# Patient Record
Sex: Female | Born: 1985 | Race: White | Hispanic: No | Marital: Single | Smoking: Current every day smoker
Health system: Southern US, Community
[De-identification: ages and names within clinical notes are randomized; demographics above are authoritative.]

## PROBLEM LIST (undated history)

## (undated) DIAGNOSIS — F319 Bipolar disorder, unspecified: Secondary | ICD-10-CM

## (undated) DIAGNOSIS — F1910 Other psychoactive substance abuse, uncomplicated: Secondary | ICD-10-CM

## (undated) DIAGNOSIS — F419 Anxiety disorder, unspecified: Secondary | ICD-10-CM

## (undated) DIAGNOSIS — G4700 Insomnia, unspecified: Secondary | ICD-10-CM

## (undated) DIAGNOSIS — F329 Major depressive disorder, single episode, unspecified: Secondary | ICD-10-CM

## (undated) DIAGNOSIS — F32A Depression, unspecified: Secondary | ICD-10-CM

---

## 2004-02-11 ENCOUNTER — Emergency Department: Payer: Self-pay | Admitting: General Practice

## 2004-07-29 ENCOUNTER — Inpatient Hospital Stay: Payer: Self-pay | Admitting: Psychiatry

## 2004-10-14 ENCOUNTER — Emergency Department: Payer: Self-pay | Admitting: Emergency Medicine

## 2005-03-28 ENCOUNTER — Emergency Department: Payer: Self-pay | Admitting: Emergency Medicine

## 2005-09-14 ENCOUNTER — Emergency Department: Payer: Self-pay | Admitting: Internal Medicine

## 2006-09-04 ENCOUNTER — Emergency Department: Payer: Self-pay | Admitting: Emergency Medicine

## 2006-11-22 ENCOUNTER — Emergency Department: Payer: Self-pay

## 2007-08-13 ENCOUNTER — Emergency Department: Payer: Self-pay | Admitting: Emergency Medicine

## 2008-02-29 ENCOUNTER — Emergency Department: Payer: Self-pay | Admitting: Emergency Medicine

## 2008-08-25 ENCOUNTER — Emergency Department: Payer: Self-pay | Admitting: Emergency Medicine

## 2009-06-03 ENCOUNTER — Emergency Department: Payer: Self-pay | Admitting: Emergency Medicine

## 2010-10-26 ENCOUNTER — Emergency Department: Payer: Self-pay | Admitting: Emergency Medicine

## 2010-11-06 ENCOUNTER — Emergency Department: Payer: Self-pay | Admitting: Emergency Medicine

## 2011-05-31 ENCOUNTER — Emergency Department (HOSPITAL_COMMUNITY)
Admission: EM | Admit: 2011-05-31 | Discharge: 2011-05-31 | Disposition: A | Discharge status: Home / Self Care | Payer: Self-pay | Attending: Emergency Medicine | Admitting: Emergency Medicine

## 2011-05-31 ENCOUNTER — Encounter (HOSPITAL_COMMUNITY): Payer: Self-pay | Admitting: *Deleted

## 2011-05-31 DIAGNOSIS — S335XXA Sprain of ligaments of lumbar spine, initial encounter: Secondary | ICD-10-CM | POA: Insufficient documentation

## 2011-05-31 DIAGNOSIS — X58XXXA Exposure to other specified factors, initial encounter: Secondary | ICD-10-CM | POA: Insufficient documentation

## 2011-05-31 DIAGNOSIS — S39012A Strain of muscle, fascia and tendon of lower back, initial encounter: Secondary | ICD-10-CM

## 2011-05-31 MED ORDER — DEXAMETHASONE 6 MG PO TABS: ORAL_TABLET | ORAL | Status: AC

## 2011-05-31 MED ORDER — HYDROCODONE-ACETAMINOPHEN 5-325 MG PO TABS
2.0000 | ORAL_TABLET | Freq: Once | ORAL | Status: AC
  Administered 2011-05-31: 2 via ORAL
  Filled 2011-05-31: qty 2

## 2011-05-31 MED ORDER — HYDROCODONE-ACETAMINOPHEN 7.5-325 MG PO TABS: 1.0000 | ORAL_TABLET | ORAL | Status: AC | PRN

## 2011-05-31 MED ORDER — ONDANSETRON HCL 4 MG PO TABS
4.0000 mg | ORAL_TABLET | Freq: Once | ORAL | Status: AC
  Administered 2011-05-31: 4 mg via ORAL
  Filled 2011-05-31: qty 1

## 2011-05-31 MED ORDER — DIAZEPAM 5 MG PO TABS
5.0000 mg | ORAL_TABLET | Freq: Once | ORAL | Status: AC
  Administered 2011-05-31: 5 mg via ORAL
  Filled 2011-05-31: qty 1

## 2011-05-31 MED ORDER — DEXAMETHASONE SODIUM PHOSPHATE 4 MG/ML IJ SOLN
8.0000 mg | Freq: Once | INTRAMUSCULAR | Status: AC
  Administered 2011-05-31: 8 mg via INTRAMUSCULAR
  Filled 2011-05-31 (×2): qty 1

## 2011-05-31 MED ORDER — METHOCARBAMOL 500 MG PO TABS: ORAL_TABLET | ORAL | Status: DC
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
## 2011-06-13 ENCOUNTER — Other Ambulatory Visit: Payer: Self-pay

## 2011-06-13 ENCOUNTER — Encounter (HOSPITAL_COMMUNITY): Payer: Self-pay | Admitting: *Deleted

## 2011-06-13 ENCOUNTER — Emergency Department (HOSPITAL_COMMUNITY): Payer: Self-pay

## 2011-06-13 ENCOUNTER — Emergency Department (HOSPITAL_COMMUNITY)
Admission: EM | Admit: 2011-06-13 | Discharge: 2011-06-13 | Disposition: A | Discharge status: Home / Self Care | Payer: Self-pay | Attending: Emergency Medicine | Admitting: Emergency Medicine

## 2011-06-13 DIAGNOSIS — R0789 Other chest pain: Secondary | ICD-10-CM | POA: Insufficient documentation

## 2011-06-13 DIAGNOSIS — R002 Palpitations: Secondary | ICD-10-CM | POA: Insufficient documentation

## 2011-06-13 DIAGNOSIS — M542 Cervicalgia: Secondary | ICD-10-CM | POA: Insufficient documentation

## 2011-06-13 DIAGNOSIS — F411 Generalized anxiety disorder: Secondary | ICD-10-CM | POA: Insufficient documentation

## 2011-06-13 DIAGNOSIS — R51 Headache: Secondary | ICD-10-CM | POA: Insufficient documentation

## 2011-06-13 DIAGNOSIS — F17200 Nicotine dependence, unspecified, uncomplicated: Secondary | ICD-10-CM | POA: Insufficient documentation

## 2011-06-13 DIAGNOSIS — Z79899 Other long term (current) drug therapy: Secondary | ICD-10-CM | POA: Insufficient documentation

## 2011-06-13 LAB — COMPREHENSIVE METABOLIC PANEL
ALT: 34 U/L (ref 0–35)
AST: 29 U/L (ref 0–37)
Albumin: 4.2 g/dL (ref 3.5–5.2)
CO2: 24 mEq/L (ref 19–32)
Calcium: 10.4 mg/dL (ref 8.4–10.5)
Chloride: 102 mEq/L (ref 96–112)
Creatinine, Ser: 0.6 mg/dL (ref 0.50–1.10)
GFR calc non Af Amer: >90 mL/min (ref >90)
Sodium: 139 mEq/L (ref 135–145)
Total Bilirubin: 0.5 mg/dL (ref 0.3–1.2)

## 2011-06-13 LAB — CBC
HCT: 43.2 % (ref 36.0–46.0)
Hemoglobin: 15.5 g/dL — ABNORMAL HIGH (ref 12.0–15.0)
MCH: 32.8 pg (ref 26.0–34.0)
MCHC: 35.9 g/dL (ref 30.0–36.0)
MCV: 91.3 fL (ref 78.0–100.0)
Platelets: 215 10*3/uL (ref 150–400)
RBC: 4.73 MIL/uL (ref 3.87–5.11)
RDW: 12.3 % (ref 11.5–15.5)
WBC: 12.1 10*3/uL — ABNORMAL HIGH (ref 4.0–10.5)

## 2011-06-13 LAB — LIPASE, BLOOD: Lipase: 95 U/L — ABNORMAL HIGH (ref 11–59)

## 2011-06-13 LAB — POCT I-STAT TROPONIN I: Troponin i, poc: 0 ng/mL (ref 0.00–0.08)

## 2011-06-13 MED ORDER — OMEPRAZOLE 20 MG PO CPDR: 20.0000 mg | DELAYED_RELEASE_CAPSULE | Freq: Every day | ORAL | Status: DC

## 2011-06-13 MED ORDER — GI COCKTAIL ~~LOC~~
30.0000 mL | Freq: Once | ORAL | Status: AC
  Administered 2011-06-13: 30 mL via ORAL
  Filled 2011-06-13: qty 30

## 2011-06-13 MED ORDER — HYDROCODONE-ACETAMINOPHEN 5-325 MG PO TABS: 1.0000 | ORAL_TABLET | Freq: Four times a day (QID) | ORAL | Status: AC | PRN

## 2011-06-13 MED ORDER — OXYCODONE-ACETAMINOPHEN 5-325 MG PO TABS
2.0000 | ORAL_TABLET | Freq: Once | ORAL | Status: AC
  Administered 2011-06-13: 2 via ORAL
  Filled 2011-06-13: qty 2

## 2011-06-13 NOTE — Discharge Instructions (Signed)
Chest Pain (Nonspecific) It is often hard to give a specific diagnosis for the cause of chest pain. There is always a chance that your pain could be related to something serious, such as a heart attack or a blood clot in the lungs. You need to follow up with your caregiver for further evaluation. CAUSES   Heartburn.   Pneumonia or bronchitis.   Anxiety and stress.   Inflammation around your heart (pericarditis) or lung (pleuritis or pleurisy).   A blood clot in the lung.   A collapsed lung (pneumothorax). It can develop suddenly on its own (spontaneous pneumothorax) or from injury (trauma) to the chest.  The chest wall is composed of bones, muscles, and cartilage. Any of these can be the source of the pain.  The bones can be bruised by injury.   The muscles or cartilage can be strained by coughing or overwork.   The cartilage can be affected by inflammation and become sore (costochondritis).  DIAGNOSIS  Lab tests or other studies, such as X-rays, an EKG, stress testing, or cardiac imaging, may be needed to find the cause of your pain.  TREATMENT   Treatment depends on what may be causing your chest pain. Treatment may include:   Acid blockers for heartburn.   Anti-inflammatory medicine.   Pain medicine for inflammatory conditions.   Antibiotics if an infection is present.   You may be advised to change lifestyle habits. This includes stopping smoking and avoiding caffeine and chocolate.   You may be advised to keep your head raised (elevated) when sleeping. This reduces the chance of acid going backward from your stomach into your esophagus.   Most of the time, nonspecific chest pain will improve within 2 to 3 days with rest and mild pain medicine.  HOME CARE INSTRUCTIONS   If antibiotics were prescribed, take the full amount even if you start to feel better.   For the next few days, avoid physical activities that bring on chest pain. Continue physical activities as  directed.   Do not smoke cigarettes or drink alcohol until your symptoms are gone.   Only take over-the-counter or prescription medicine for pain, discomfort, or fever as directed by your caregiver.   Follow your caregiver's suggestions for further testing if your chest pain does not go away.   Keep any follow-up appointments you made. If you do not go to an appointment, you could develop lasting (chronic) problems with pain. If there is any problem keeping an appointment, you must call to reschedule.  SEEK MEDICAL CARE IF:   You think you are having problems from the medicine you are taking. Read your medicine instructions carefully.   Your chest pain does not go away, even after treatment.   You develop a rash with blisters on your chest.  SEEK IMMEDIATE MEDICAL CARE IF:   You have increased chest pain or pain that spreads to your arm, neck, jaw, back, or belly (abdomen).   You develop shortness of breath, an increasing cough, or you are coughing up blood.   You have severe back or abdominal pain, feel sick to your stomach (nauseous) or throw up (vomit).   You develop severe weakness, fainting, or chills.   You have an oral temperature above 102 F (38.9 C), not controlled by medicine.  THIS IS AN EMERGENCY. Do not wait to see if the pain will go away. Get medical help at once. Call your local emergency services (911 in [place] Do not drive yourself to   the hospital. MAKE SURE YOU:   Understand these instructions.   Will watch your condition.   Will get help right away if you are not doing well or get worse.  Document Released: 01/25/2005 Document Revised: 12/28/2010 Document Reviewed: 11/21/2007 [employer] Patient Information 2012 [employer]. 

## 2011-06-13 NOTE — ED Notes (Signed)
C/o intermittent chest pain, describes as tightness and sharp, onset last night; states "under a lot of stress" and was at rest when the pain first started; c/o "heart racing"; states pain radiates to left shoulder.

## 2011-06-13 NOTE — ED Provider Notes (Signed)
History     CSN: [id number]  Arrival date & time 06/13/11  0044   First MD Initiated Contact with Patient 06/13/11 0054      Chief Complaint  Patient presents with  . Chest Pain     Patient is a 26 y.o. female presenting with chest pain. The history is provided by the patient.  Chest Pain    the patient reports several days of constant chest tightness and heaviness.  She reports she has been under a lot of stress lately as there was a recent death in her family.  She reports she occasionally has palpitations as well.  This is described as her heart racing.  She currently reports ongoing discomfort in her chest.  She denies shortness of breath.  Nothing worsens her symptoms.  Nothing improves her symptoms.  She does have an early family history of heart disease with her father dying in his late 17s of an MI.  She's never had symptoms like this before.  She tried Pepto-Bismol at home without improvement.  She's tried ibuprofen without improvement.  She's had no recent long travel or unilateral leg swelling.  She's had no recent surgery.  She has no prior history of DVT or pulmonary embolism.  She still does smoke cigarettes.  Her discomfort is nonpleuritic  History reviewed. No pertinent past medical history.  Past Surgical History  Procedure Date  . Cesarean section   . Cesarean section     No family history on file.  History  Substance Use Topics  . Smoking status: Current Some Day Smoker -- 0.5 packs/day    Types: Cigarettes  . Smokeless tobacco: Not on file  . Alcohol Use: No    OB History    Grav Para Term Preterm Abortions TAB SAB Ect Mult Living                  Review of Systems  Cardiovascular: Positive for chest pain.  All other systems reviewed and are negative.    Allergies  Aspirin and Morphine and related  Home Medications   Current Outpatient Rx  Name Route Sig Dispense Refill  . ACETAMINOPHEN 500 MG PO TABS Oral Take 1,000 mg by mouth as needed.  For pain    . HYDROCODONE-ACETAMINOPHEN 5-325 MG PO TABS Oral Take 1 tablet by mouth every 6 (six) hours as needed for pain. 8 tablet 0  . METHOCARBAMOL 500 MG PO TABS  2 po tid for spasm 30 tablet 0  . OMEPRAZOLE 20 MG PO CPDR Oral Take 1 capsule (20 mg total) by mouth daily. 30 capsule 0    BP 132/93  Pulse 110  Temp(Src) 98.1 F (36.7 C) (Oral)  Resp 20  SpO2 100%  LMP 06/06/2011  Physical Exam  Nursing note and vitals reviewed. Constitutional: She is oriented to person, place, and time. She appears well-developed and well-nourished. No distress.  HENT:  Head: Normocephalic and atraumatic.  Eyes: EOM are normal.  Neck: Normal range of motion.  Cardiovascular: Normal rate, regular rhythm and normal heart sounds.   Pulmonary/Chest: Effort normal and breath sounds normal.  Abdominal: Soft. She exhibits no distension. There is no tenderness.  Musculoskeletal: Normal range of motion.  Neurological: She is alert and oriented to person, place, and time.  Skin: Skin is warm and dry.  Psychiatric: Judgment normal.       Anxious appearing    ED Course  Procedures (including critical care time)   Date: 06/13/2011  Rate: 89  Rhythm: normal sinus rhythm  QRS Axis: normal  Intervals: normal  ST/T Wave abnormalities: normal  Conduction Disutrbances: none  Narrative Interpretation:   Old EKG Reviewed: No prior ecg     Labs Reviewed  CBC - Abnormal; Notable for the following:    WBC 12.1 (*)    Hemoglobin 15.5 (*)    All other components within normal limits  LIPASE, BLOOD - Abnormal; Notable for the following:    Lipase 95 (*)    All other components within normal limits  COMPREHENSIVE METABOLIC PANEL  POCT I-STAT TROPONIN I   Dg Chest 2 View  06/13/2011  *RADIOLOGY REPORT*  Clinical Data: Medial chest pain.  Pain left side of face and neck.  CHEST - 2 VIEW  Comparison: None.  Findings: Normal heart size and pulmonary vascularity. Hyperinflation suggesting emphysema.  No  focal airspace consolidation in the lungs.  No blunting of costophrenic angles. No pneumothorax.  IMPRESSION: Mild emphysematous changes.  No evidence of active pulmonary disease.  Original Report Authenticated By: Marlon Pel, M.D.   i Personally reviewed the x-ray  1. Chest pain       MDM  The patient is well-appearing.  Her EKG and troponin are normal.  Her pain sounds more related to gastritis and GERD.  She has been undergoing a significant amount of stress.  Doubt PE.  DC home in good condition.3:10 AM She feels much better after GI cocktail and pain medicine here.  She has had resolution of all her symptoms.        Lyanne Co, MD 06/13/11 [phone]

## 2011-07-04 ENCOUNTER — Other Ambulatory Visit: Payer: Self-pay

## 2011-07-04 ENCOUNTER — Emergency Department (HOSPITAL_COMMUNITY)
Admission: EM | Admit: 2011-07-04 | Discharge: 2011-07-04 | Disposition: A | Discharge status: Home / Self Care | Payer: Self-pay | Attending: Emergency Medicine | Admitting: Emergency Medicine

## 2011-07-04 ENCOUNTER — Encounter (HOSPITAL_COMMUNITY): Payer: Self-pay | Admitting: *Deleted

## 2011-07-04 DIAGNOSIS — F411 Generalized anxiety disorder: Secondary | ICD-10-CM | POA: Insufficient documentation

## 2011-07-04 DIAGNOSIS — F17200 Nicotine dependence, unspecified, uncomplicated: Secondary | ICD-10-CM | POA: Insufficient documentation

## 2011-07-04 DIAGNOSIS — I498 Other specified cardiac arrhythmias: Secondary | ICD-10-CM | POA: Insufficient documentation

## 2011-07-04 DIAGNOSIS — K219 Gastro-esophageal reflux disease without esophagitis: Secondary | ICD-10-CM | POA: Insufficient documentation

## 2011-07-04 DIAGNOSIS — R0789 Other chest pain: Secondary | ICD-10-CM

## 2011-07-04 DIAGNOSIS — R071 Chest pain on breathing: Secondary | ICD-10-CM | POA: Insufficient documentation

## 2011-07-04 LAB — COMPREHENSIVE METABOLIC PANEL
ALT: 10 U/L (ref 0–35)
AST: 12 U/L (ref 0–37)
Albumin: 3.5 g/dL (ref 3.5–5.2)
Alkaline Phosphatase: 59 U/L (ref 39–117)
BUN: 11 mg/dL (ref 6–23)
Chloride: 102 mEq/L (ref 96–112)
Potassium: 4 mEq/L (ref 3.5–5.1)
Sodium: 137 mEq/L (ref 135–145)
Total Bilirubin: 0.3 mg/dL (ref 0.3–1.2)
Total Protein: 6.2 g/dL (ref 6.0–8.3)

## 2011-07-04 MED ORDER — GI COCKTAIL ~~LOC~~
30.0000 mL | Freq: Once | ORAL | Status: AC
  Administered 2011-07-04: 30 mL via ORAL
  Filled 2011-07-04: qty 30

## 2011-07-04 MED ORDER — CYCLOBENZAPRINE HCL 10 MG PO TABS: 10.0000 mg | ORAL_TABLET | Freq: Three times a day (TID) | ORAL | Status: AC | PRN

## 2011-07-04 NOTE — ED Provider Notes (Signed)
History     CSN: [id number]  Arrival date & time 07/04/11  0105   First MD Initiated Contact with Patient 07/04/11 0131      Chief Complaint  Patient presents with  . Chest Pain    (Consider location/radiation/quality/duration/timing/severity/associated sxs/prior treatment) HPI  Patient relates she's had chest pain for over a month. She states it is intermittent and can last for several minutes up to an hour. She states the pain is a pressure feeling and it also feels tight. She states she feels sometimes like she's having trouble breathing and trouble swallowing like she is smothering. She however daily and drink without difficulty.She states deep breathing can make it feel worse and also she is anxious or upset. She states nothing makes it feel better.she's had a cough without wheezing or fever. She denies nausea or vomiting. She states her chest feels sore now. She relates she was seen earlier today by Dr. Vear Clock who diagnosed her with having anxiety and anxiety attacks and started her on Paxil  PCP Dr. Vear Clock in [place]  History reviewed. No pertinent past medical history.  Past Surgical History  Procedure Date  . Cesarean section   . Cesarean section     No family history on file.   History  Substance Use Topics  . Smoking status: Current Some Day Smoker -- 0.5 packs/day    Types: Cigarettes  . Smokeless tobacco: Not on file  . Alcohol Use: No  lives with spouse Unemployed Quit smoking last week.   OB History    Grav Para Term Preterm Abortions TAB SAB Ect Mult Living                  Review of Systems  All other systems reviewed and are negative.    Allergies  Aspirin and Morphine and related  Home Medications   Current Outpatient Rx  Name Route Sig Dispense Refill  . ACETAMINOPHEN 500 MG PO TABS Oral Take 1,000 mg by mouth as needed. For pain    . METHOCARBAMOL 500 MG PO TABS  2 po tid for spasm 30 tablet 0  . OMEPRAZOLE 20 MG PO CPDR Oral  Take 1 capsule (20 mg total) by mouth daily. 30 capsule 0  Patient denies taking any birth control pills or using hormones  BP 111/66  Pulse 67  Resp 18  Ht 5\' 2"  (1.575 m)  Wt 125 lb (56.7 kg)  BMI 22.86 kg/m2  SpO2 96%  LMP 06/06/2011  Vital signs normal    Physical Exam  Constitutional: She is oriented to person, place, and time. She appears well-developed and well-nourished.  Non-toxic appearance. She does not appear ill. No distress.       Patient looks at her husband before she answers any question.  HENT:  Head: Normocephalic and atraumatic.  Right Ear: External ear normal.  Left Ear: External ear normal.  Nose: Nose normal. No mucosal edema or rhinorrhea.  Mouth/Throat: Oropharynx is clear and moist and mucous membranes are normal. No dental abscesses or uvula swelling.  Eyes: Conjunctivae and EOM are normal. Pupils are equal, round, and reactive to light.  Neck: Normal range of motion and full passive range of motion without pain. Neck supple.  Cardiovascular: Normal rate, regular rhythm and normal heart sounds.  Exam reveals no gallop and no friction rub.   No murmur heard. Pulmonary/Chest: Effort normal and breath sounds normal. No respiratory distress. She has no wheezes. She has no rhonchi. She has no rales. She  exhibits tenderness. She exhibits no crepitus.       Patient is noted to be holding her right hand over her chest. She indicates her pain is over her lower costochondral junctions on the left  Abdominal: Soft. Normal appearance and bowel sounds are normal. She exhibits no distension. There is no tenderness. There is no rebound and no guarding.  Musculoskeletal: Normal range of motion. She exhibits no edema and no tenderness.       Moves all extremities well.   Neurological: She is alert and oriented to person, place, and time. She has normal strength. No cranial nerve deficit.  Skin: Skin is warm, dry and intact. No rash noted. No erythema. No pallor.    Psychiatric: Her speech is normal and behavior is normal. Her mood appears not anxious.       Very flat affect    ED Course  Procedures (including critical care time)  She was seen here on February 12 for same symptoms. At that time she had a chest x-ray showing COPD changes. Her labs were normal except for an elevated lipase at 95.   Medications  gi cocktail (30 mL Oral Given 07/04/11 0236)   Patient states her pain is improved   Results for orders placed during the hospital encounter of 07/04/11  LIPASE, BLOOD      Component Value Range   Lipase 49  11 - 59 (U/L)  COMPREHENSIVE METABOLIC PANEL      Component Value Range   Sodium 137  135 - 145 (mEq/L)   Potassium 4.0  3.5 - 5.1 (mEq/L)   Chloride 102  96 - 112 (mEq/L)   CO2 28  19 - 32 (mEq/L)   Glucose, Bld 96  70 - 99 (mg/dL)   BUN 11  6 - 23 (mg/dL)   Creatinine, Ser 1.61  0.50 - 1.10 (mg/dL)   Calcium 9.3  8.4 - 09.6 (mg/dL)   Total Protein 6.2  6.0 - 8.3 (g/dL)   Albumin 3.5  3.5 - 5.2 (g/dL)   AST 12  0 - 37 (U/L)   ALT 10  0 - 35 (U/L)   Alkaline Phosphatase 59  39 - 117 (U/L)   Total Bilirubin 0.3  0.3 - 1.2 (mg/dL)   GFR calc non Af Amer >90  >90 (mL/min)   GFR calc Af Amer >90  >90 (mL/min)   Laboratory interpretation all normal   Dg Chest 2 View  06/13/2011  *RADIOLOGY REPORT*  Clinical Data: Medial chest pain.  Pain left side of face and neck.  CHEST - 2 VIEW  Comparison: None.  Findings: Normal heart size and pulmonary vascularity. Hyperinflation suggesting emphysema.  No focal airspace consolidation in the lungs.  No blunting of costophrenic angles. No pneumothorax.  IMPRESSION: Mild emphysematous changes.  No evidence of active pulmonary disease.  Original Report Authenticated By: Marlon Pel, M.D.    Date: 07/04/2011  Rate: 81  Rhythm: normal sinus rhythm and sinus arrhythmia  QRS Axis: normal  Intervals: normal  ST/T Wave abnormalities: normal  Conduction Disutrbances:none  Narrative  Interpretation:   Old EKG Reviewed: none available     1. Chest wall pain   2. GERD (gastroesophageal reflux disease)    New Prescriptions   CYCLOBENZAPRINE (FLEXERIL) 10 MG TABLET    Take 1 tablet (10 mg total) by mouth 3 (three) times daily as needed for muscle spasms.  Prilosec OTC    Plan discharge Devoria Albe, MD, Armando Gang    MDM  Ward Givens, MD 07/04/11 [phone]

## 2011-07-04 NOTE — ED Notes (Signed)
Patient reports chest pain is still there, not as bad as when she first got here, but reports there is still some soreness in her chest.

## 2011-07-04 NOTE — Discharge Instructions (Signed)
Increase the prilosec to twice a day for the next two weeks then back down to once a day.. Take the flexeril for your chest wall pain. Follow-up with Dr Vear Clock if you continue to have pain.

## 2011-07-04 NOTE — ED Notes (Signed)
Pt reports intermittent episodes of rt sided cp for the past month, reports pain has gotten worse today, pt also reports she was seen by her pcp today and prescribed (paxil) for anxiety

## 2011-07-10 NOTE — ED Provider Notes (Signed)
Medical screening examination/treatment/procedure(s) were performed by non-physician practitioner and as supervising physician I was immediately available for consultation/collaboration.  Daizha Anand, MD 07/10/11 2054 

## 2012-05-07 ENCOUNTER — Observation Stay: Payer: Self-pay | Admitting: Obstetrics and Gynecology

## 2012-05-07 LAB — DRUG SCREEN, URINE
Amphetamines, Ur Screen: NEGATIVE (ref ?–1000)
Barbiturates, Ur Screen: NEGATIVE (ref ?–200)
Benzodiazepine, Ur Scrn: NEGATIVE (ref ?–200)
Cannabinoid 50 Ng, Ur ~~LOC~~: POSITIVE (ref ?–50)
Cocaine Metabolite,Ur ~~LOC~~: NEGATIVE (ref ?–300)
Phencyclidine (PCP) Ur S: NEGATIVE (ref ?–25)

## 2012-05-21 ENCOUNTER — Ambulatory Visit: Payer: Self-pay | Admitting: Obstetrics and Gynecology

## 2012-05-21 LAB — CBC WITH DIFFERENTIAL/PLATELET
Basophil %: 0.3 %
Eosinophil #: 0.3 10*3/uL (ref 0.0–0.7)
Eosinophil %: 2.2 %
HGB: 13.1 g/dL (ref 12.0–16.0)
Lymphocyte #: 2.5 10*3/uL (ref 1.0–3.6)
MCH: 32.1 pg (ref 26.0–34.0)
MCV: 94 fL (ref 80–100)
Monocyte #: 1.1 x10 3/mm — ABNORMAL HIGH (ref 0.2–0.9)
Monocyte %: 8.1 %
Neutrophil %: 70.8 %
Platelet: 167 10*3/uL (ref 150–440)

## 2012-05-22 ENCOUNTER — Inpatient Hospital Stay: Payer: Self-pay | Admitting: Obstetrics & Gynecology

## 2012-05-23 LAB — HEMATOCRIT: HCT: 37 % (ref 35.0–47.0)

## 2012-08-02 ENCOUNTER — Emergency Department: Payer: Self-pay | Admitting: Emergency Medicine

## 2012-08-02 LAB — COMPREHENSIVE METABOLIC PANEL
Albumin: 4.3 g/dL (ref 3.4–5.0)
Anion Gap: 3 — ABNORMAL LOW (ref 7–16)
BUN: 10 mg/dL (ref 7–18)
Bilirubin,Total: 0.5 mg/dL (ref 0.2–1.0)
Chloride: 109 mmol/L — ABNORMAL HIGH (ref 98–107)
Co2: 27 mmol/L (ref 21–32)
EGFR (Non-African Amer.): >60
Osmolality: 277 (ref 275–301)
SGPT (ALT): 15 U/L (ref 12–78)
Sodium: 139 mmol/L (ref 136–145)
Total Protein: 7.7 g/dL (ref 6.4–8.2)

## 2012-08-02 LAB — CBC
HCT: 43.8 % (ref 35.0–47.0)
HGB: 14.6 g/dL (ref 12.0–16.0)
RBC: 4.75 10*6/uL (ref 3.80–5.20)
RDW: 14 % (ref 11.5–14.5)
WBC: 8.3 10*3/uL (ref 3.6–11.0)

## 2012-08-02 LAB — WET PREP, GENITAL

## 2012-08-02 LAB — URINALYSIS, COMPLETE
Nitrite: NEGATIVE
Protein: 30
RBC,UR: 423 /HPF (ref 0–5)
Squamous Epithelial: 4
WBC UR: 39 /HPF (ref 0–5)

## 2013-08-03 ENCOUNTER — Inpatient Hospital Stay: Payer: Self-pay | Admitting: Specialist

## 2013-08-03 LAB — BASIC METABOLIC PANEL
Anion Gap: 6 — ABNORMAL LOW (ref 7–16)
BUN: 5 mg/dL — ABNORMAL LOW (ref 7–18)
CO2: 29 mmol/L (ref 21–32)
Calcium, Total: 7.9 mg/dL — ABNORMAL LOW (ref 8.5–10.1)
Chloride: 102 mmol/L (ref 98–107)
Creatinine: 0.65 mg/dL (ref 0.60–1.30)
GLUCOSE: 81 mg/dL (ref 65–99)
OSMOLALITY: 270 (ref 275–301)
Potassium: 2.9 mmol/L — ABNORMAL LOW (ref 3.5–5.1)
Sodium: 137 mmol/L (ref 136–145)

## 2013-08-03 LAB — COMPREHENSIVE METABOLIC PANEL
ALK PHOS: 64 U/L
Albumin: 4 g/dL (ref 3.4–5.0)
Anion Gap: 10 (ref 7–16)
BUN: 13 mg/dL (ref 7–18)
Bilirubin,Total: 0.4 mg/dL (ref 0.2–1.0)
CALCIUM: 8.4 mg/dL — AB (ref 8.5–10.1)
CREATININE: 0.87 mg/dL (ref 0.60–1.30)
Chloride: 99 mmol/L (ref 98–107)
Co2: 29 mmol/L (ref 21–32)
Glucose: 18 mg/dL — CL (ref 65–99)
Osmolality: 271 (ref 275–301)
POTASSIUM: 2.8 mmol/L — AB (ref 3.5–5.1)
SGOT(AST): 19 U/L (ref 15–37)
SGPT (ALT): 14 U/L (ref 12–78)
Sodium: 138 mmol/L (ref 136–145)
Total Protein: 7.4 g/dL (ref 6.4–8.2)

## 2013-08-03 LAB — CSF CELL CT + PROT + GLU PANEL
CSF Tube #: 1
Eosinophil: 0 %
GLUCOSE, CSF: 43 mg/dL (ref 40–75)
Lymphocytes: 33 %
Monocytes/Macrophages: 61 %
Neutrophils: 6 %
Other Cells: 0 %
Protein, CSF: 49 mg/dL — ABNORMAL HIGH (ref 15–45)
RBC (CSF): 0 /mm3
WBC (CSF): 20 /mm3

## 2013-08-03 LAB — CBC WITH DIFFERENTIAL/PLATELET
Basophil #: 0 10*3/uL (ref 0.0–0.1)
Basophil #: 0.1 10*3/uL (ref 0.0–0.1)
Basophil %: 0.2 %
Basophil %: 0.3 %
EOS ABS: 0 10*3/uL (ref 0.0–0.7)
EOS ABS: 0.1 10*3/uL (ref 0.0–0.7)
EOS PCT: 0.1 %
Eosinophil %: 0.3 %
HCT: 46.5 % (ref 35.0–47.0)
HCT: 42.5 % (ref 35.0–47.0)
HGB: 15.3 g/dL (ref 12.0–16.0)
HGB: 14.2 g/dL (ref 12.0–16.0)
LYMPHS ABS: 1.4 10*3/uL (ref 1.0–3.6)
LYMPHS ABS: 3.1 10*3/uL (ref 1.0–3.6)
LYMPHS PCT: 15.3 %
Lymphocyte %: 6.5 %
MCH: 31.7 pg (ref 26.0–34.0)
MCH: 31.9 pg (ref 26.0–34.0)
MCHC: 32.9 g/dL (ref 32.0–36.0)
MCHC: 33.4 g/dL (ref 32.0–36.0)
MCV: 96 fL (ref 80–100)
MCV: 96 fL (ref 80–100)
MONOS PCT: 5.6 %
MONOS PCT: 9.2 %
Monocyte #: 1.2 x10 3/mm — ABNORMAL HIGH (ref 0.2–0.9)
Monocyte #: 1.8 x10 3/mm — ABNORMAL HIGH (ref 0.2–0.9)
NEUTROS ABS: 15.1 10*3/uL — AB (ref 1.4–6.5)
NEUTROS PCT: 87.6 %
Neutrophil #: 19.3 10*3/uL — ABNORMAL HIGH (ref 1.4–6.5)
Neutrophil %: 74.9 %
PLATELETS: 277 10*3/uL (ref 150–440)
Platelet: 213 10*3/uL (ref 150–440)
RBC: 4.82 10*6/uL (ref 3.80–5.20)
RBC: 4.44 10*6/uL (ref 3.80–5.20)
RDW: 14.1 % (ref 11.5–14.5)
RDW: 14.2 % (ref 11.5–14.5)
WBC: 22 10*3/uL — ABNORMAL HIGH (ref 3.6–11.0)
WBC: 20.2 10*3/uL — AB (ref 3.6–11.0)

## 2013-08-03 LAB — URINALYSIS, COMPLETE
BILIRUBIN, UR: NEGATIVE
Bacteria: NONE SEEN
Ketone: NEGATIVE
Leukocyte Esterase: NEGATIVE
Nitrite: NEGATIVE
PH: 6 (ref 4.5–8.0)
Protein: 100
SPECIFIC GRAVITY: 1.028 (ref 1.003–1.030)
WBC UR: 6 /HPF (ref 0–5)

## 2013-08-03 LAB — CSF CELL COUNT WITH DIFFERENTIAL
CSF Tube #: 3
Eosinophil: 0 %
Lymphocytes: 38 %
MONOCYTES/MACROPHAGES: 48 %
Neutrophils: 14 %
OTHER CELLS: 0 %
RBC (CSF): 14 /mm3
WBC (CSF): 6 /mm3

## 2013-08-03 LAB — ETHANOL: Ethanol: <3 mg/dL

## 2013-08-03 LAB — DRUG SCREEN, URINE
AMPHETAMINES, UR SCREEN: NEGATIVE (ref ?–1000)
BARBITURATES, UR SCREEN: NEGATIVE (ref ?–200)
Benzodiazepine, Ur Scrn: POSITIVE (ref ?–200)
COCAINE METABOLITE, UR ~~LOC~~: NEGATIVE (ref ?–300)
Cannabinoid 50 Ng, Ur ~~LOC~~: POSITIVE (ref ?–50)
MDMA (ECSTASY) UR SCREEN: NEGATIVE (ref ?–500)
Methadone, Ur Screen: NEGATIVE (ref ?–300)
Opiate, Ur Screen: POSITIVE (ref ?–300)
PHENCYCLIDINE (PCP) UR S: NEGATIVE (ref ?–25)
Tricyclic, Ur Screen: NEGATIVE (ref ?–1000)

## 2013-08-03 LAB — LACTATE DEHYDROGENASE: LDH: 188 U/L (ref 81–246)

## 2013-08-03 LAB — LITHIUM LEVEL: Lithium: 0.27 mmol/L — ABNORMAL LOW

## 2013-08-03 LAB — PREGNANCY, URINE: Pregnancy Test, Urine: NEGATIVE m[IU]/mL

## 2013-08-03 LAB — CK: CK, TOTAL: 194 U/L — AB

## 2013-08-03 LAB — MAGNESIUM: MAGNESIUM: 2.2 mg/dL

## 2013-08-04 ENCOUNTER — Telehealth: Payer: Self-pay | Admitting: Gastroenterology

## 2013-08-04 ENCOUNTER — Ambulatory Visit: Payer: Self-pay | Admitting: Neurology

## 2013-08-04 LAB — CBC WITH DIFFERENTIAL/PLATELET
BASOS PCT: 0.3 %
Basophil #: 0.1 10*3/uL (ref 0.0–0.1)
Eosinophil #: 0.2 10*3/uL (ref 0.0–0.7)
Eosinophil %: 0.9 %
HCT: 47.7 % — AB (ref 35.0–47.0)
HGB: 15.6 g/dL (ref 12.0–16.0)
LYMPHS PCT: 15.5 %
Lymphocyte #: 3.1 10*3/uL (ref 1.0–3.6)
MCH: 31.6 pg (ref 26.0–34.0)
MCHC: 32.7 g/dL (ref 32.0–36.0)
MCV: 97 fL (ref 80–100)
Monocyte #: 2 x10 3/mm — ABNORMAL HIGH (ref 0.2–0.9)
Monocyte %: 9.9 %
Neutrophil #: 14.5 10*3/uL — ABNORMAL HIGH (ref 1.4–6.5)
Neutrophil %: 73.4 %
PLATELETS: 230 10*3/uL (ref 150–440)
RBC: 4.94 10*6/uL (ref 3.80–5.20)
RDW: 14 % (ref 11.5–14.5)
WBC: 19.8 10*3/uL — AB (ref 3.6–11.0)

## 2013-08-04 LAB — GLUCOSE, RANDOM: Glucose: 99 mg/dL (ref 65–99)

## 2013-08-04 LAB — BASIC METABOLIC PANEL
ANION GAP: 4 — AB (ref 7–16)
BUN: 3 mg/dL — ABNORMAL LOW (ref 7–18)
Calcium, Total: 8.3 mg/dL — ABNORMAL LOW (ref 8.5–10.1)
Chloride: 106 mmol/L (ref 98–107)
Co2: 30 mmol/L (ref 21–32)
Creatinine: 0.66 mg/dL (ref 0.60–1.30)
EGFR (African American): >60
GLUCOSE: 101 mg/dL — AB (ref 65–99)
Osmolality: 276 (ref 275–301)
Potassium: 2.8 mmol/L — ABNORMAL LOW (ref 3.5–5.1)
Sodium: 140 mmol/L (ref 136–145)

## 2013-08-04 LAB — MAGNESIUM: MAGNESIUM: 2.4 mg/dL

## 2013-08-04 LAB — POTASSIUM
POTASSIUM: 3.4 mmol/L — AB (ref 3.5–5.1)
POTASSIUM: 3.4 mmol/L — AB (ref 3.5–5.1)

## 2013-08-04 NOTE — Telephone Encounter (Signed)
error 

## 2013-08-05 LAB — CBC WITH DIFFERENTIAL/PLATELET
BASOS ABS: 0.1 10*3/uL (ref 0.0–0.1)
Basophil %: 0.3 %
Eosinophil #: 0.5 10*3/uL (ref 0.0–0.7)
Eosinophil %: 2.7 %
HCT: 43.4 % (ref 35.0–47.0)
HGB: 14.2 g/dL (ref 12.0–16.0)
LYMPHS PCT: 16 %
Lymphocyte #: 3.1 10*3/uL (ref 1.0–3.6)
MCH: 31.5 pg (ref 26.0–34.0)
MCHC: 32.7 g/dL (ref 32.0–36.0)
MCV: 96 fL (ref 80–100)
Monocyte #: 2 x10 3/mm — ABNORMAL HIGH (ref 0.2–0.9)
Monocyte %: 10.6 %
Neutrophil #: 13.6 10*3/uL — ABNORMAL HIGH (ref 1.4–6.5)
Neutrophil %: 70.4 %
PLATELETS: 222 10*3/uL (ref 150–440)
RBC: 4.51 10*6/uL (ref 3.80–5.20)
RDW: 14.2 % (ref 11.5–14.5)
WBC: 19.3 10*3/uL — ABNORMAL HIGH (ref 3.6–11.0)

## 2013-08-05 LAB — BASIC METABOLIC PANEL
ANION GAP: 3 — AB (ref 7–16)
BUN: 4 mg/dL — ABNORMAL LOW (ref 7–18)
CALCIUM: 7.8 mg/dL — AB (ref 8.5–10.1)
CHLORIDE: 105 mmol/L (ref 98–107)
CREATININE: 0.88 mg/dL (ref 0.60–1.30)
Co2: 28 mmol/L (ref 21–32)
EGFR (African American): >60
GLUCOSE: 134 mg/dL — AB (ref 65–99)
OSMOLALITY: 271 (ref 275–301)
Potassium: 4.3 mmol/L (ref 3.5–5.1)
Sodium: 136 mmol/L (ref 136–145)

## 2013-08-05 LAB — MAGNESIUM: Magnesium: 2.1 mg/dL

## 2013-08-05 LAB — PHOSPHORUS: Phosphorus: 2.4 mg/dL — ABNORMAL LOW (ref 2.5–4.9)

## 2013-08-05 LAB — TSH: Thyroid Stimulating Horm: 2.51 u[IU]/mL

## 2013-08-06 LAB — BASIC METABOLIC PANEL
Anion Gap: 3 — ABNORMAL LOW (ref 7–16)
BUN: 3 mg/dL — ABNORMAL LOW (ref 7–18)
CHLORIDE: 106 mmol/L (ref 98–107)
CREATININE: 0.74 mg/dL (ref 0.60–1.30)
Calcium, Total: 8 mg/dL — ABNORMAL LOW (ref 8.5–10.1)
Co2: 30 mmol/L (ref 21–32)
Glucose: 85 mg/dL (ref 65–99)
Osmolality: 273 (ref 275–301)
Potassium: 3.6 mmol/L (ref 3.5–5.1)
Sodium: 139 mmol/L (ref 136–145)

## 2013-08-06 LAB — CBC WITH DIFFERENTIAL/PLATELET
BASOS ABS: 0.1 10*3/uL (ref 0.0–0.1)
BASOS PCT: 0.5 %
EOS PCT: 5.7 %
Eosinophil #: 1 10*3/uL — ABNORMAL HIGH (ref 0.0–0.7)
HCT: 40.9 % (ref 35.0–47.0)
HGB: 13.6 g/dL (ref 12.0–16.0)
Lymphocyte #: 3.2 10*3/uL (ref 1.0–3.6)
Lymphocyte %: 18.5 %
MCH: 32.1 pg (ref 26.0–34.0)
MCHC: 33.2 g/dL (ref 32.0–36.0)
MCV: 97 fL (ref 80–100)
Monocyte #: 1.3 x10 3/mm — ABNORMAL HIGH (ref 0.2–0.9)
Monocyte %: 7.3 %
NEUTROS ABS: 11.6 10*3/uL — AB (ref 1.4–6.5)
Neutrophil %: 68 %
Platelet: 185 10*3/uL (ref 150–440)
RBC: 4.23 10*6/uL (ref 3.80–5.20)
RDW: 14 % (ref 11.5–14.5)
WBC: 17.1 10*3/uL — ABNORMAL HIGH (ref 3.6–11.0)

## 2013-08-06 LAB — CSF CULTURE W GRAM STAIN

## 2013-08-07 LAB — CBC WITH DIFFERENTIAL/PLATELET
BASOS PCT: 0.4 %
Basophil #: 0.1 10*3/uL (ref 0.0–0.1)
EOS ABS: 1 10*3/uL — AB (ref 0.0–0.7)
Eosinophil %: 8.3 %
HCT: 37.9 % (ref 35.0–47.0)
HGB: 12.2 g/dL (ref 12.0–16.0)
Lymphocyte #: 2.2 10*3/uL (ref 1.0–3.6)
Lymphocyte %: 17.8 %
MCH: 31.2 pg (ref 26.0–34.0)
MCHC: 32.3 g/dL (ref 32.0–36.0)
MCV: 97 fL (ref 80–100)
Monocyte #: 0.9 x10 3/mm (ref 0.2–0.9)
Monocyte %: 7.7 %
Neutrophil #: 8 10*3/uL — ABNORMAL HIGH (ref 1.4–6.5)
Neutrophil %: 65.8 %
Platelet: 183 10*3/uL (ref 150–440)
RBC: 3.93 10*6/uL (ref 3.80–5.20)
RDW: 14 % (ref 11.5–14.5)
WBC: 12.1 10*3/uL — ABNORMAL HIGH (ref 3.6–11.0)

## 2013-08-07 LAB — ALBUMIN: Albumin: 2.6 g/dL — ABNORMAL LOW (ref 3.4–5.0)

## 2013-08-07 LAB — BASIC METABOLIC PANEL
Anion Gap: 0 — ABNORMAL LOW (ref 7–16)
BUN: 4 mg/dL — ABNORMAL LOW (ref 7–18)
CO2: 33 mmol/L — AB (ref 21–32)
CREATININE: 0.65 mg/dL (ref 0.60–1.30)
Calcium, Total: 8.2 mg/dL — ABNORMAL LOW (ref 8.5–10.1)
Chloride: 106 mmol/L (ref 98–107)
Glucose: 100 mg/dL — ABNORMAL HIGH (ref 65–99)
Osmolality: 275 (ref 275–301)
Potassium: 3.5 mmol/L (ref 3.5–5.1)
Sodium: 139 mmol/L (ref 136–145)

## 2013-08-07 LAB — MAGNESIUM: MAGNESIUM: 2 mg/dL

## 2013-08-07 LAB — VANCOMYCIN, TROUGH: Vancomycin, Trough: 7 ug/mL — ABNORMAL LOW (ref 10–20)

## 2013-08-07 LAB — PHOSPHORUS: Phosphorus: 4.4 mg/dL (ref 2.5–4.9)

## 2013-08-08 LAB — BASIC METABOLIC PANEL
Anion Gap: 1 — ABNORMAL LOW (ref 7–16)
BUN: 3 mg/dL — ABNORMAL LOW (ref 7–18)
Calcium, Total: 8.4 mg/dL — ABNORMAL LOW (ref 8.5–10.1)
Chloride: 104 mmol/L (ref 98–107)
Co2: 33 mmol/L — ABNORMAL HIGH (ref 21–32)
Creatinine: 0.55 mg/dL — ABNORMAL LOW (ref 0.60–1.30)
EGFR (Non-African Amer.): >60
GLUCOSE: 97 mg/dL (ref 65–99)
OSMOLALITY: 272 (ref 275–301)
Potassium: 3.4 mmol/L — ABNORMAL LOW (ref 3.5–5.1)
SODIUM: 138 mmol/L (ref 136–145)

## 2013-08-08 LAB — CULTURE, BLOOD (SINGLE)

## 2013-08-08 LAB — MAGNESIUM: Magnesium: 1.9 mg/dL

## 2013-08-08 LAB — PHOSPHORUS: Phosphorus: 3.2 mg/dL (ref 2.5–4.9)

## 2013-08-09 LAB — CULTURE, BLOOD (SINGLE)

## 2013-08-10 LAB — CULTURE, BLOOD (SINGLE)

## 2013-08-12 LAB — CREATININE, SERUM
CREATININE: 0.69 mg/dL (ref 0.60–1.30)
EGFR (African American): >60
EGFR (Non-African Amer.): >60

## 2013-08-14 ENCOUNTER — Inpatient Hospital Stay: Payer: Self-pay | Admitting: Psychiatry

## 2013-08-15 LAB — LITHIUM LEVEL: LITHIUM: 0.99 mmol/L

## 2013-08-19 LAB — COMPREHENSIVE METABOLIC PANEL
ALBUMIN: 3.3 g/dL — AB (ref 3.4–5.0)
ALK PHOS: 55 U/L
ALT: 18 U/L (ref 12–78)
Anion Gap: 4 — ABNORMAL LOW (ref 7–16)
BUN: 8 mg/dL (ref 7–18)
Bilirubin,Total: 0.5 mg/dL (ref 0.2–1.0)
CREATININE: 0.61 mg/dL (ref 0.60–1.30)
Calcium, Total: 8.9 mg/dL (ref 8.5–10.1)
Chloride: 107 mmol/L (ref 98–107)
Co2: 28 mmol/L (ref 21–32)
EGFR (African American): >60
EGFR (Non-African Amer.): >60
GLUCOSE: 87 mg/dL (ref 65–99)
OSMOLALITY: 275 (ref 275–301)
POTASSIUM: 3.8 mmol/L (ref 3.5–5.1)
SGOT(AST): 31 U/L (ref 15–37)
Sodium: 139 mmol/L (ref 136–145)
Total Protein: 6.4 g/dL (ref 6.4–8.2)

## 2013-08-19 LAB — URINALYSIS, COMPLETE
BACTERIA: NONE SEEN
BLOOD: NEGATIVE
Bilirubin,UR: NEGATIVE
Glucose,UR: NEGATIVE mg/dL (ref 0–75)
Ketone: NEGATIVE
LEUKOCYTE ESTERASE: NEGATIVE
Nitrite: NEGATIVE
PH: 6 (ref 4.5–8.0)
PROTEIN: NEGATIVE
RBC,UR: 1 /HPF (ref 0–5)
Specific Gravity: 1.017 (ref 1.003–1.030)
Squamous Epithelial: 6

## 2013-08-19 LAB — CBC
HCT: 40.5 % (ref 35.0–47.0)
HGB: 13.4 g/dL (ref 12.0–16.0)
MCH: 31.8 pg (ref 26.0–34.0)
MCHC: 33.1 g/dL (ref 32.0–36.0)
MCV: 96 fL (ref 80–100)
Platelet: 222 10*3/uL (ref 150–440)
RBC: 4.22 10*6/uL (ref 3.80–5.20)
RDW: 14.1 % (ref 11.5–14.5)
WBC: 8.4 10*3/uL (ref 3.6–11.0)

## 2013-08-19 LAB — LITHIUM LEVEL: LITHIUM: 0.83 mmol/L

## 2014-04-02 IMAGING — US US OB LIMITED
1 series · 14 of 28 positions shown · non-contrast
Comparison: none

REASON FOR EXAM: Fetal heart beat
COMMENTS:   May transport without cardiac monitor

[Series 1: us ob limited · 0.26mm/px · 14 of 35 slices shown]
[im 2/35]
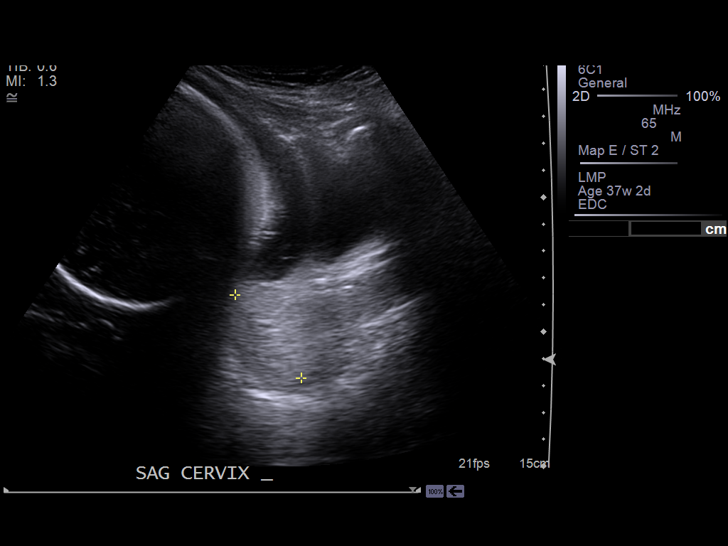
[im 4/35]
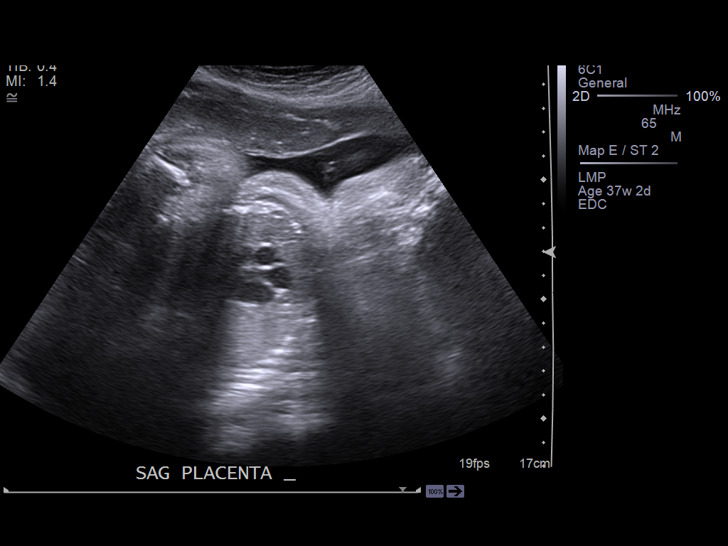
[im 7/35]
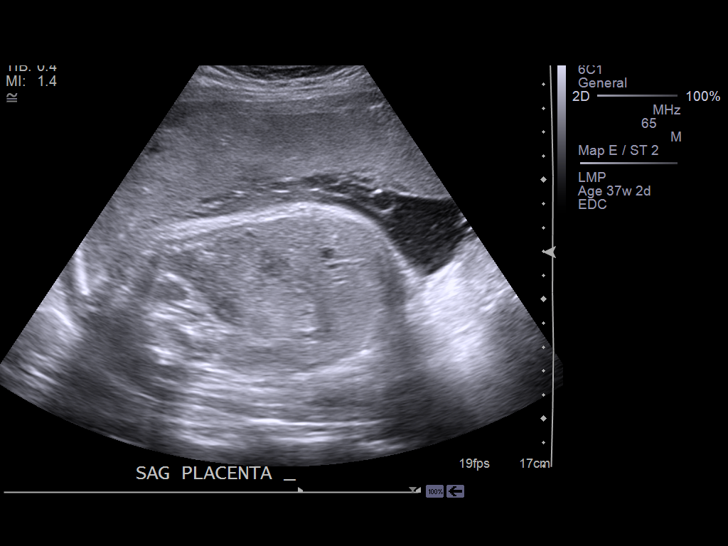
[im 9/35]
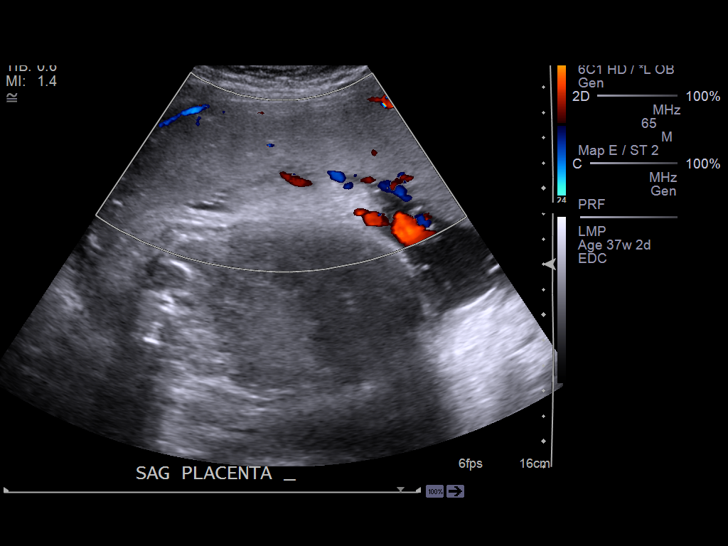
[im 12/35]
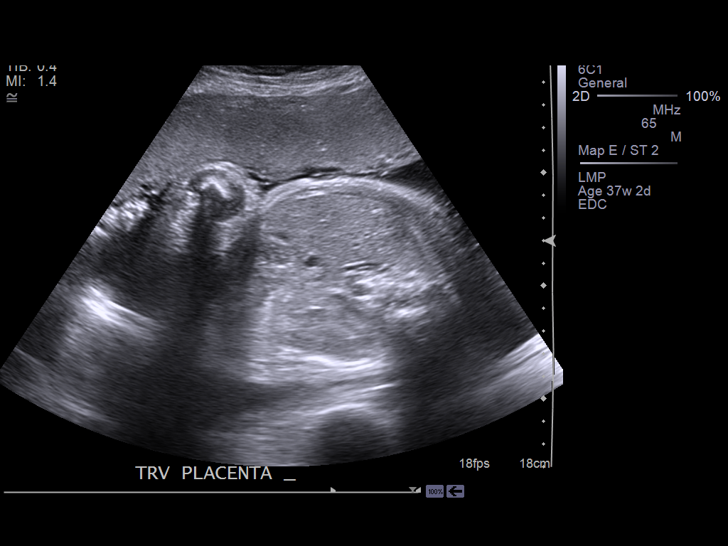
[im 14/35]
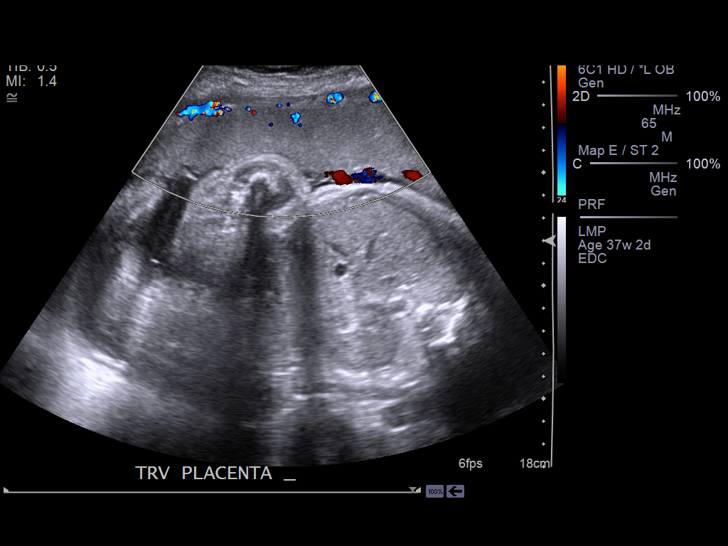
[im 17/35]
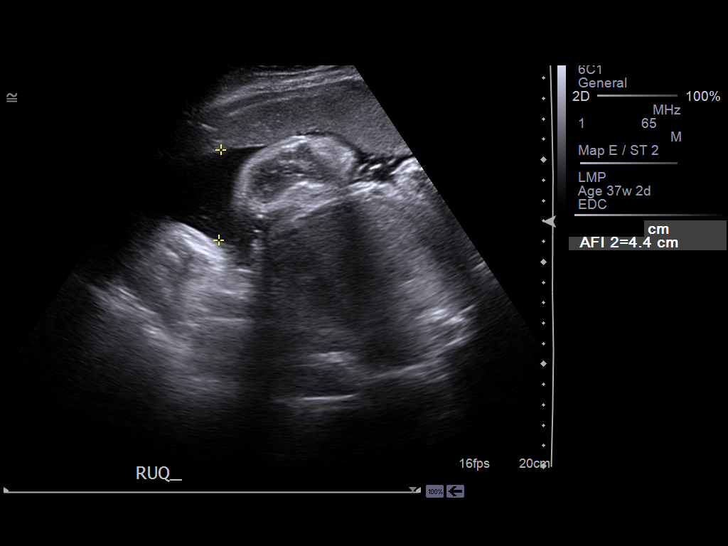
[im 19/35]
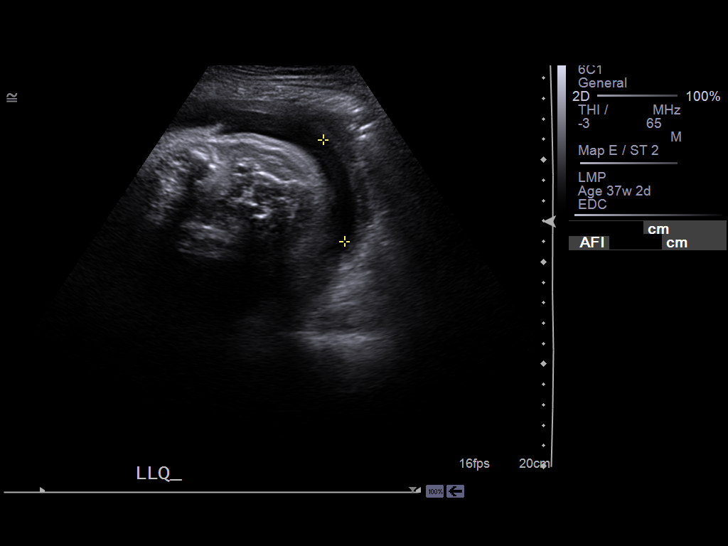
[im 22/35]
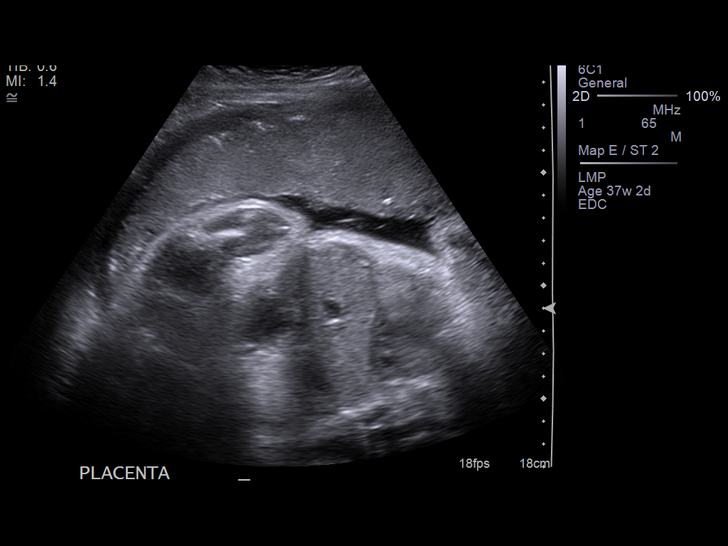
[im 24/35]
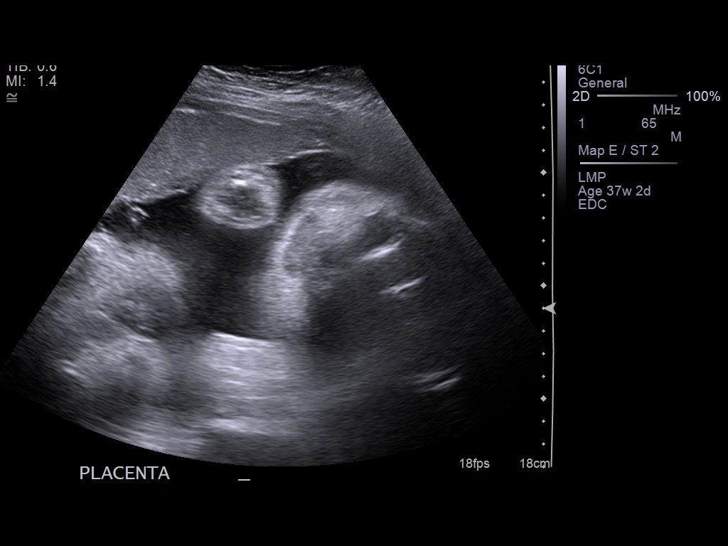
[im 27/35]
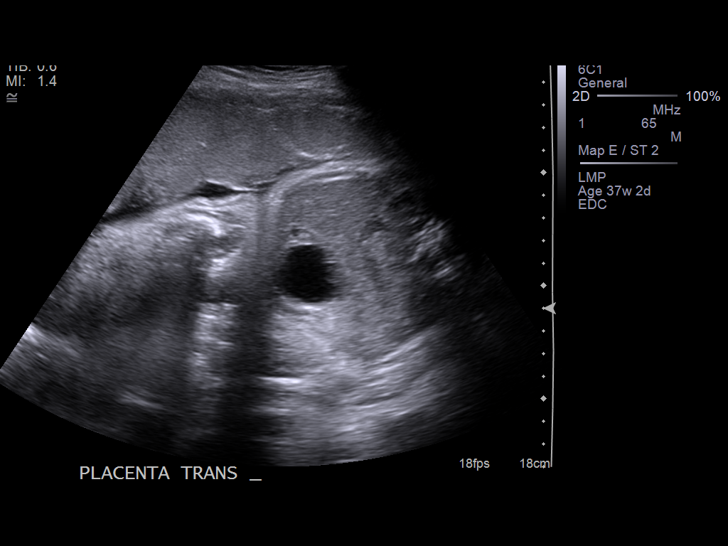
[im 29/35]
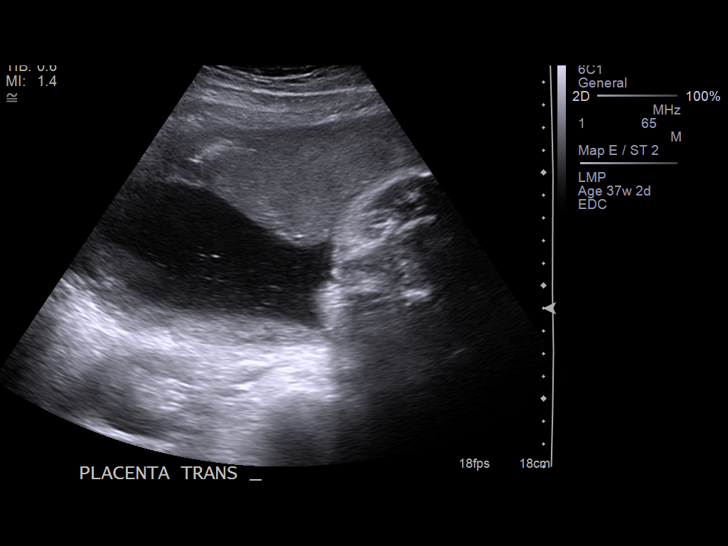
[im 32/35]
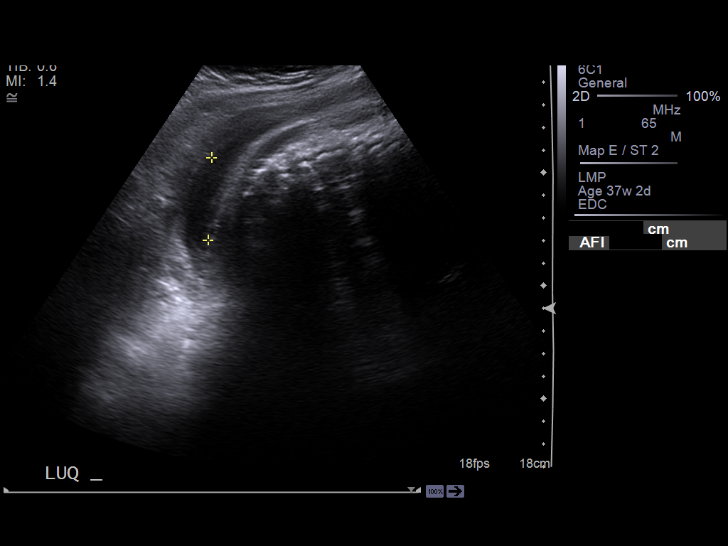
[im 35/35]
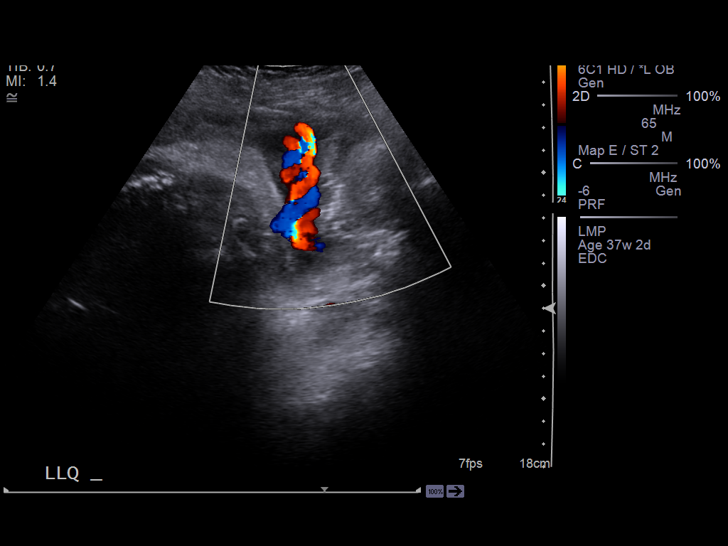

[14 of 28 positions shown; findings below may reference images not displayed]

PROCEDURE:     US  - US LIMITED OB  - May 07, 2012  [DATE]

RESULT:     A limited OB ultrasound was performed in this patient who is
reportedly at 37 weeks gestation.

There is a viable IUP with cephalic presentation. The fetal cardiac rate is
133 beats per minute. The amniotic fluid index at 16.2 cm is at
approximately the 75th percentile. The placenta is fundal and anterior.
There is no evidence of placental abruption. The cervix is closed.
IMPRESSION: There is no evidence of placental abruption. The fetus
appears viable in cephalic presentation. The amniotic fluid volume is at
approximate the 75th percentile.

[REDACTED]

## 2014-08-21 NOTE — Op Note (Signed)
PATIENT NAME:  Janice Walls, Janice Walls MR#:  [id number] DATE OF BIRTH:  08/06/1985  DATE OF PROCEDURE:  05/22/2012  PREOPERATIVE DIAGNOSES:  1. Intrauterine pregnancy at [redacted] weeks gestational age.  2. History of prior cesarean section, desires repeat.   POSTOPERATIVE DIAGNOSES:  1. Intrauterine pregnancy at [redacted] weeks gestational age.  2. History of prior cesarean section, desires repeat.   PROCEDURES: Repeat low transverse cesarean section via Pfannenstiel incision.   ANESTHESIA: Spinal.  SURGEON: Will Bonnet, MD  ESTIMATED BLOOD LOSS: 750 mL.   OPERATIVE FLUIDS: 800 mL crystalloid.   COMPLICATIONS: None.   FINDINGS:  1. Normal-appearing gravid uterus, fallopian tubes and ovaries.  2. Viable female infant with Apgars of 8 at one minute and 9 at five minutes with a weight of 3050 grams.   SPECIMENS: None.   CONDITION AT THE END OF THE PROCEDURE: Stable.  PROCEDURE IN DETAIL: After the patient was met in the preoperative area and the procedure was reviewed with her and her questions were answered, she was taken to the Operating Room. She was placed under spinal anesthesia, which was found to be adequate. She was prepped and draped in the dorsal supine lithotomy position with a leftward tilt. After a timeout was called, a Pfannenstiel incision was made and carried through the various layers until the peritoneum was identified and entered sharply, then extended in the cranial and caudal directions. A bladder flap was created, and the bladder retractor was used to pull the bladder out of the operative area of interest. The uterine position was ascertained, and a low transverse hysterotomy was made with the scalpel. The hysterotomy was extended laterally with cranial and caudal tension. The fetal vertex was grasped, elevated to the hysterotomy and the head followed by the shoulders and the rest of the body was delivered without difficulty. The cord was clamped and cut, and the infant was  handed to the awaiting pediatrician. Cord blood was collected. The placenta was removed, and the uterus was exteriorized and cleared of all clots and debris. The hysterotomy was closed with #0 Vicryl in a running locked fashion. A second layer of the same suture was used to obtain hemostasis. The uterus was returned to the abdomen, and the gutters were cleared of all clots and debris. After hemostasis was verified, the fascia was closed using #0 Maxon using 2 sutures, each starting at the apices and meeting in the midline, where they were tied off. The skin was closed using #4-0 Monocryl in a subcuticular fashion. Skin was reinforced using Steri-Strips. For extra pain control, the patient was given 20 mL of 0.5% Marcaine plain on the superior aspect of the incision.   The patient tolerated the procedure well. Sponge, lap and needle counts were correct x2. Prior to the incision, the patient received 2 grams of Ancef. She was taken to the recovery area in stable condition.    ____________________________ Will Bonnet, MD sdj:OSi D: 05/22/2012 11:32:53 ET T: 05/22/2012 12:10:13 ET JOB#: [id number]  cc: Will Bonnet, MD, <Dictator> Will Bonnet MD ELECTRONICALLY SIGNED 07/02/2012 21:04

## 2014-08-22 NOTE — Consult Note (Signed)
PATIENT NAME:  Janice Walls, Janice Walls MR#:  [id number] DATE OF BIRTH:  1986/02/11  DATE OF CONSULTATION:  08/08/2013  REFERRING PHYSICIAN:  Dr. Hilda Lias CONSULTING PHYSICIAN:  Jolanta B. Pucilowska, MD  REASON FOR CONSULTATION: To evaluate a catatonic patient.   IDENTIFYING DATA: Janice Walls is a 29 year old female with history of depression, anxiety, psychosis and substance abuse.   CHIEF COMPLAINT: The patient unable to state.   HISTORY OF PRESENT ILLNESS:  From her mother, we have learned that the patient has a long history of substance abuse. She has been doing a little better lately, but the mother suspects that her current boyfriend is giving her drugs. We know that she ran out of prescribed Xanax 3 or 4 days prior to admission. She was, however, positive for benzodiazepines and narcotic pain killers. The patient was brought to the hospital after she was found unresponsive by her boyfriend. It was most likely due to profound hyperglycemia with blood sugar of 18. She was admitted to CCU and intubated. During her CCU hospitalization, the patient was not given any of her regular medications of Ativan, Cogentin, Haldol and lithium. When she was extubated this morning, she was doing fine initially but quickly deteriorated and became catatonic, mute, unable to respond to any questions, frightened, most likely paranoid, unable to swallow with severe psychomotor retardation. The patient's mother reports that similar symptoms were observed in the past twice at [facility]. Every time the patient stops taking Haldol, she becomes catatonic. The mother reports that the patient has not been drinking extensively and that she has been using medications prescribed by Dr. Barnett Abu at [facility].   PAST PSYCHIATRIC HISTORY: There are 2 psychiatric hospitalizations at [facility], one associated with postpartum onset with severe psychosis. The patient has been in care of Dr. Barnett Abu.  Apparently she has been compliant with  medications per boyfriend.   FAMILY PSYCHIATRIC HISTORY: On the mother's side, there was a "crazy" aunt. On the father's side, there is a history of substance use.   PAST MEDICAL HISTORY: None.   ALLERGIES: 1.  ASPIRIN. 2.  LATUDA.   MEDICATIONS ON ADMISSION: Haldol 5 mg daily, Cogentin 1 mg daily, lithium 300 mg twice daily, Xanax 1 mg 4 times daily.   SOCIAL HISTORY: She lives with her boyfriend. Her mother lives 7 minutes away. The patient has 2 children, ages 10 years and 14 months. They are in the care of her parents. She is working third shift.   REVIEW OF SYSTEMS: Unable to obtain.   PHYSICAL EXAMINATION: VITAL SIGNS: Blood pressure 121/84, pulse 86, respirations 21, temperature 99.5.  GENERAL: This is a young female with severe psychomotor retardation in no acute distress. The rest of the physical examination is deferred to her primary attending.   LABORATORY DATA: Chemistries are within normal limits except for potassium 3.4. Blood alcohol level on admission zero, blood glucose 18. LFTs within normal limits. Total CK 194, lithium level 0.27. Urine tox screen is positive for benzodiazepines, cannabinoids and opioids. CBC within normal limits except for white blood count of 22,000. Urinalysis is not suggestive of urinary tract infection. Pregnancy test is negative.   EKG: Sinus rhythm with short PR, otherwise normal EKG.   MENTAL STATUS EXAMINATION: The patient is examined in her room on the medical floor. She is sitting in bed but mostly bent over almost down on her stomach while sitting, secreting copious amounts of saliva. She was extubated just this morning. She is unable to participate in the interview.  She does not respond to my presence at all. She allows being helped by CNA while she is coughing.  Her mother, as an experiment, shows the patient a picture of her baby and the patient starts crying immediately. The mother states that this is her pattern. There is no agitation, no  unsafe behaviors.   DIAGNOSIS: AXIS I:   Schizoaffective disorder, bipolar type, catatonia, opioid dependence.  AXIS II:  Deferred.  AXIS III: Respiratory failure status post extubation.  AXIS IV: Mental and physical illness, substance abuse.  AXIS V: Global assessment of functioning 25.   PLAN: 1.  We will start treatment of catatonia, 1 mg of Ativan every 2 hours IM until symptoms resolve. In addition, Haldol 5 mg twice daily IM for psychosis. We will also give Cogentin 0.5 mg with each injection of Haldol. We will hold lithium. She has liquid lithium prescribed, but I do not believe she takes anything by mouth at this point. 2.  I will follow up. I will be in the hospital this weekend. Call me if needed.   ____________________________ Ellin GoodieJolanta B. Jennet MaduroPucilowska, MD jbp:ce D: 08/08/2013 17:51:03 ET T: 08/08/2013 18:21:39 ET JOB#: [id number]  cc: Jolanta B. Jennet MaduroPucilowska, MD, <Dictator> Shari ProwsJOLANTA B PUCILOWSKA MD ELECTRONICALLY SIGNED 08/17/2013 11:03

## 2014-08-22 NOTE — Consult Note (Signed)
Brief Consult Note: Diagnosis: Schizoaffective disorder, Catatonia.   Patient was seen by consultant.   Consult note dictated.   Recommend further assessment or treatment.   Orders entered.   Comments: [name] has a h/o depression, psychosis and substance use admitted with AMS and BG of 18. She has been off her medications for the past several days while in CCU. Following extubation, the patient became catatonic. She had similar symptoms during [facility].  Today she feels better but is very sedated form Ativa. The mother was able to have a conversation with her this am. We still do not know if she is able to swallow.   PLAN: 1. We will lower frequency and dose of stadning Ativan. She still has prn ativan available. I will start librium  taper as soon as she is able swallow.   2. We will lower Haldol to 5 mg im at night. This is her regular dose. She could get it by mouth when she resumes po.   3. The family would like substance abuse treatment.   4. I will follow along this weekend.  Electronic Signatures: Kristine LineaPucilowska, Nasra Counce (MD)  (Signed 11-Apr-15 12:48)  Authored: Brief Consult Note   Last Updated: 11-Apr-15 12:48 by Kristine LineaPucilowska, Janice Walls (MD)

## 2014-08-22 NOTE — Discharge Summary (Signed)
PATIENT NAME:  Janice Walls, Janice Walls MR#:  [id number] DATE OF BIRTH:  26-May-1985  DATE OF ADMISSION:  08/03/2013 DATE OF DISCHARGE:  08/14/2013  For a detailed note, please take a look at the history and physical on admission.  Please also look at the interim discharge summary done by Dr. Adrian Saran which covers the extensive part of the hospital course from April 5th until April 12th.  This covers interim course from April 13th until April 16th.   CONSULTANTS DURING THE HOSPITAL COURSE:   1.  Dr. Loretha Brasil.  2.  Dr. Alverda Skeans from neurology.  3.  Dr. Jennet Maduro, psychiatry.  4.  Dr. Belia Heman, pulmonary critical care.   PERTINENT STUDIES DONE DURING THE INTERIM HOSPITAL COURSE:  Is as follows:  The MRI of the brain done with and without contrast showing abnormal signal in both globus pallidus, slightly greater on the left.  Appearance is compatible with recent injury such as anoxia or toxic ingestion or metabolic disorder.    The patient as mentioned is being discharged to Behavioral Medicine.   DISCHARGE DIAGNOSES:  Are as follows:  1.  Altered mental status, encephalopathy secondary to hypoglycemia and benzodiazepine withdrawal.  2.  Polysubstance abuse.  3.  Hypoglycemia.  4.  Bipolar disorder with catatonia.   The patient is being discharged on the following medications:  Lithium 300 mg twice daily, benztropine 1 mg at bedtime, Haldol 2.5 mg q. 12 hours, chlordiazepoxide 25 mg q. 6 hours, nicotine patch 21 mg transdermal daily, Scopolamine patch q. 72 hours and Keppra 250 mg twice daily x 3 days.   BRIEF INTERIM HOSPITAL COURSE:  This is a 29 year old female who was originally admitted to the hospital on April 5th secondary to altered mental status and encephalopathy, thought to be secondary to hypoglycemia and possible benzodiazepine withdrawal.  1.  Altered mental status, encephalopathy.  This was thought to be related to severe hypoglycemia and benzodiazepine withdrawal as the patient was on  Xanax and had ran out of it.  There was also some concern about underlying seizures.  Although the patient's initial EEG showed no seizure-type activity.  She had a repeat EEG which was also very nonspecific.  She was empirically started on Keppra and is currently being weaned off the antiepileptics.  She will take Keppra for the next three days and then stop it.  Her mental status over the past few days has significantly improved.  Her MRI did show some abnormality in the globus pallidus, but as per neurology, this is probably related to the lithium that she has been taking, but no evidence of acute neurologic abnormality.  Psychiatry has also been following the patient due to substance abuse and underlying catatonic behavior.  At this point, since the patient's mental status has significantly improved she is being discharged to Behavioral Medicine on her maintenance bipolar medications including lithium, Haldol as mentioned.  2.  Acute respiratory failure.  The patient was initially intubated due to airway protection as she was significantly altered.  She was extubated on April 9th and has since then been doing well.  There has been some concern for possible aspiration, but her lungs are presently clear and she has no evidence of oxygen desaturations presently.  3.  Hypoglycemia.  This was secondary to poor by mouth intake, although there was some concern for a possible sulfonylurea use and also insulinoma.  She had insulin and C-peptide levels checked which were negative.  Her sulfonylurea screen is still pending.  She is  tolerating a regular diet well.  Initially, she was also on D5 fluids, but now has been weaned off of it and her blood sugars have remained stable.  4.  Leukocytosis.  This was likely stress margination.  There was no evidence of an acute infectious source.  She underwent an extensive work-up including chest x-ray, urinalysis, lumbar puncture, all of which were negative.  There was some  concern at some point that she may have HSV encephalitis, but her HSV titers in the spinal fluid were negative and she has now been off acyclovir for over 48 hours or so.  5.  Hypokalemia.  This has since then been supplemented and now resolved.  6.  History of bipolar disorder and catatonia.  The patient has been followed by psychiatry while in the hospital.  She has been maintained on her Haldol, Cogentin and lithium.  She was also started on a Librium taper.  Further care for her psychiatric condition is as per psychiatry which is where she is currently being discharged to.  7.  Increasing oral secretions.  Postintubation, the patient had significant gagging and oral secretions, but she was not able to eat properly.  She was started on a scopolamine patch along with glycopyrrolate which has significantly improved her secretions at this point.  She is being discharged on a scopolamine patch as stated.   CODE STATUS:  THE PATIENT IS A FULL CODE.   The patient presently is ambulating well with the help of physical therapy.  She is also eating a regular diet now.  She is therefore being discharged to Behavioral Medicine for substance abuse detox and also for management of her underlying bipolar disorder and catatonia.  TIME SPENT:  50 minutes.    ____________________________ Rolly PancakeVivek J. Cherlynn KaiserSainani, MD vjs:ea D: 08/14/2013 15:49:24 ET T: 08/14/2013 23:08:45 ET JOB#: [id number]  cc: Rolly PancakeVivek J. Cherlynn KaiserSainani, MD, <Dictator> Houston SirenVIVEK J Zacharee Gaddie MD ELECTRONICALLY SIGNED 08/25/2013 10:52

## 2014-08-22 NOTE — Consult Note (Signed)
PATIENT NAME:  Janice Walls, Janice Walls MR#:  [id number] DATE OF BIRTH:  February 28, 1986  DATE OF CONSULTATION:  08/04/2013  REFERRING PHYSICIAN:   CONSULTING PHYSICIAN:  Pauletta BrownsYuriy Moncerrath Berhe, MD  REASON FOR CONSULTATION: Altered mental status and questionable seizure activity.   HISTORY OF PRESENT ILLNESS: This is a 29 year old female brought into the Emergency Department due to altered mental status. The patient's fiance found her unresponsive last morning and she was sleeping and more somnolent. During the afternoon, she woke up and but still was slightly sedated. The patient has a psychiatric history and was on Cogentin and Xanax. As per history, the patient did run out of his Xanax about 3 to 4 days ago. On admission, found to have hypoglycemic fingerstick 18. She had seizure activity, status post D5 and D50. The patient also has history of Haldol and lithium use. The patient is status post CAT scan of the head that did not show any acute abnormalities. The patient did not present with any fevers, but did have a lumbar puncture, not suspicious for infectious cause.   REVIEW OF SYSTEMS: Unable to obtain at this time.   ALLERGIES: ASPIRIN AND LATUDA.   SOCIAL HISTORY: She does smoke a pack per day for the past 10 years. No alcohol use as per patient. No illicit drug use, but positive U-tox screen for opiates and marijuana.   MEDICATIONS: Include Xanax, benztropine, Haldol and lithium.  IMAGING AND LABORATORY WORKUP: Reviewed.   PHYSICAL EXAMINATION:  VITAL SIGNS: Include a pulse of 70, respirations 15, blood pressure 94/65, pulse oximetry 99%. The patient is intubated. Physical examination, at this point, unable to assess full examination as this patient was on propofol. She has very sluggish pupils and very sluggish corneals. She does not over breathe the vent. Does not respond to any painful stimuli. Sensation could not be assessed. Coordination could not be assessed. Sensation could not be assessed.    IMPRESSION: A 29 year old female brought by EMS for altered mental status, found to be hypoglycemic, status post  seizure activity.   PLAN: At this point, I do not think this is NMS, which is neuroleptic malignant syndrome as the patient's tone is within normal limits. She does not have any fevers. I suspect her seizure activity is likely from hypoglycemia as she does not have any history of seizures in the past. I would continue her lithium. Her case is also probably contributed to the fact that she ran out of Xanax 3 to 4 days prior to admission, so this could be a Xanax withdrawal. The patient denies any drug use, but does have U-tox positive for opiates. In the Emergency Department, she did receive Narcan, which probably worsened her symptoms of opiate withdrawal. The patient is getting EEG right now. I would put her on baseline Keppra 500 mg twice a day and start tapering down propofol to wake her up. Do not continue dantrolene. No further imaging at this point. Thank you, it was a pleasure seeing this patient.  ____________________________ Pauletta BrownsYuriy Darion Juhasz, MD yz:aw D: 08/04/2013 09:49:27 ET T: 08/04/2013 10:10:21 ET JOB#: [id number]  cc: Pauletta BrownsYuriy Lajoya Dombek, MD, <Dictator> Pauletta BrownsYURIY Fabiha Rougeau MD ELECTRONICALLY SIGNED 08/27/2013 11:32

## 2014-08-22 NOTE — Consult Note (Signed)
Brief Consult Note: Diagnosis: Schizoaffective disorder, Catatonia resolving, Pseudobulbar palsy?.   Consult note dictated.   Recommend further assessment or treatment.   Orders entered.   Comments: [name] has a h/o depression, psychosis and substance use admitted with AMS and BG of 18. She developed catatonia after her medications were discontinued at the time of intubation. She had similar symptoms during her hospitalization [facility].  The patient continues to improve. She is no longer catatonic but developed symptoms suggestive of psudobulbar palsy with emotional incontinence, dysarthria and dysphagia. Awaiting brain MRI.   PLAN: 1. We will continue Librium taper. The patient addicted to Xanax.   2. We will continue Haldol to 5 mg im at night, cogentin and Lithium for mood stabilization.    3. The family would like substance abuse treatment.   4. I will follow along.  Electronic Signatures: Janice Walls, Janice Walls (MD)  (Signed 13-Apr-15 22:00)  Authored: Brief Consult Note   Last Updated: 13-Apr-15 22:00 by Janice Walls, Janice Walls (MD)

## 2014-08-22 NOTE — H&P (Signed)
PATIENT NAME:  Janice DurandSIMMONS, Henna MR#:  [id number] DATE OF BIRTH:  10/16/1985  DATE OF ADMISSION:  08/03/2013  PRIMARY CARE PHYSICIAN: Dr. Loma Senderharles Phillips.   PSYCHIATRIST: Dr. Barnett AbuWiseman.  CHIEF COMPLAINT: Unresponsiveness and altered mental status.   HISTORY OF PRESENT ILLNESS: This is a 29 year old female who was brought in by EMS due to altered mental status and unresponsiveness. The patient's fiance found her unresponsive this morning, but he thought she was sleeping as she has been not feeling well for the past couple of days. He works third shift so he went to sleep. He woke up this afternoon and she was still sleeping and unresponsive. He was a bit worried and called EMS. EMS arrived and the patient was having some shallow breathing. They thought she had overdosed on something, they gave her some Narcan. Also give her an amp of dextrose. She started to have some twitching episodes when she arrived to the emergency room. She continued to have twitching episodes and was unable to maintain her airway and therefore was urgently intubated here in the emergency room. As per the fiance, the patient ran out of her Xanax about 3 or 4 days ago. She had been complaining of some chills and feeling warm. She had some low-grade fevers at home around 100. She was also having some nausea, vomiting, and was not eating much for the past couple of days. They attempted to keep her hydrated by giving him some Gatorade and some tea and bought her some antiemetics over the counter. She was starting to feel better in the past 24 hours and this morning her mother actually came to see her and brought her some tea. At that time, the patient was awake and responsive, until she was found by her husband this afternoon unresponsive. The patient also is on Haldol and lithium and she has been compliant with those meds. She also is on Cogentin she has been taking that along with her Haldol. She has not escalated any of the doses of any  of her medications. Here in the emergency room, patient's lithium level was normal. The patient underwent an extensive workup here in the emergency room including a CT head which was negative, also a lumbar puncture which showed no evidence of meningitis. Hospitalist services were contacted for further treatment and evaluation.   REVIEW OF SYSTEMS: Unobtainable given fact that the patient is sedated and intubated.   PAST MEDICAL HISTORY: Consistent with bipolar disorder with psychosis.   ALLERGIES: ASPIRIN AND LATUDA.   SOCIAL HISTORY: She does smoke about a pack per day, has been smoking for the past 10 years. No alcohol abuse. No illicit drug abuse. Lives at home with her fiance and her child.   FAMILY HISTORY: Both mother and father are alive. I cannot obtain a detailed family history as the patient is sedated and intubated and the patient's fiance was not aware of any.   CURRENT MEDICATIONS: As follows: Xanax 1 mg q.i.d. benztropine 1 mg 1 tab at bedtime, Haldol 0.5 mg daily, lithium 300 mg b.i.d.   PHYSICAL EXAMINATION: Presently is as follows:  VITALS SIGNS: Are noted to be: Temperature is 100.3, pulse 89, respirations 18, blood pressure 104/70, sats 100% on 50% ventilator.  GENERAL: She is a critically ill-appearing patient, unresponsive, sedated but in no apparent distress.  HEENT: She is atraumatic, normocephalic. Her extraocular muscles are unable to assess as she is sedated. Her pupils are dilated but responsive.  NECK: Supple. There is no jugular venous distention.  No bruits, no lymphadenopathy or thyromegaly.  HEART: Regular rate and rhythm. No murmurs, no rubs, no clicks.  LUNGS: Clear to auscultation bilaterally. No rales, no rhonchi, no wheezes.  ABDOMEN: Soft, flat, nontender, nondistended. Has good bowel sounds. No hepatosplenomegaly appreciated.  EXTREMITIES: No evidence of any cyanosis, clubbing, or peripheral edema. Has posterior pedal and radial pulses bilaterally.   NEUROLOGICAL: The patient is sedated and intubated. Difficult to do a full neurological exam.  LYMPHATIC: There is no cervical or axillary lymphadenopathy.  SKIN: Moist and warm with no rashes appreciated.   LABORATORY DATA: Serum glucose initially of 18. Now it is improved to 92. BUN 13, creatinine 0.8, sodium of 138, potassium 2.8, chloride 99, bicarbonate 29. LFTs within normal limits. CK total is 194. Lithium level is 0.27. Urine toxicology positive for benzodiazepines., opiates, and marijuana. White cell count is 22, hemoglobin 15.3, hematocrit 46.5, platelet count of 277,000. The patient had a lumbar puncture done, which showed only 20 white cells and it was clear. The opening pressures were normal. CSF culture presently showing no organisms. The patient's urinalysis is within normal limits. Her pregnancy test is negative.   The patient did have a CT scan of the head done without contrast, which showed no acute intracranial process. The patient also underwent a chest x-ray initially which showed no active disease. A chest x-ray post intubation shows endotracheal tube in good position.   ASSESSMENT AND PLAN: This is a 29 year old female with heparin with past medical history of a bipolar disorder/psychosis, presented to the hospital today due to being found unresponsive.  PROBLEM:  1. Altered mental status/encephalopathy. The exact etiology of this is unclear presently. The differential is vast. Questionable if is related to Xanax withdrawal versus primary seizures versus possible neuroleptic malignant syndrome or even related to severe hypoglycemia. There is no evidence of any acute neurologic process as the patient's CT head is negative. The LP is negative for any acute meningitis. Her urine toxicology is positive for opiates and benzodiazepines and marijuana. The patient did get some Narcan and D50 and then started to have some twitching episodes. Then she could not maintain her airway and  therefore was intubated. Her lithium level appears to be normal. My suspicion for neuroleptic malignant syndrome as low as she takes very little on Haldol. Her LFTs are normal. Her CK is only mildly elevated. Her LDH level is normal. The patient has been given 1 dose of dantrolene already. I will get an EEG in the morning. Get a neurology consult. I discussed the case with Dr. Loretha Brasil, who will see the patient in the morning. Follow up patient's mental status once off sedation.  2. Acute respiratory failure. The patient was intubated due to airway protection. There is no acute pulmonary process, although I will get a pulmonary consult to help with the vent management. Follow serial ABGs.  3. Hypoglycemia. The exact etiology of this is unclear. Possibly could be related to poor p.o. intake as she was not eating and drinking and having some nausea and vomiting for the past couple of days prior to coming in. She has been placed on a D10 drip. Her sugars are improved now. I will check an insulin and C-peptide level and also do a Sulfonylurea screen. Follow blood sugars closely.  4. Leukocytosis. Questionable if this is stress margination from the altered mental status and encephalopathy. I do not appreciate any evidence of acute infectious source. The patient's chest x-ray is negative. Urinalysis is negative. The lumbar  puncture is negative for any evidence of meningitis. She has received 1 dose of empiric vancomycin and cefepime in the ER, but I will hold off on giving any further antibiotics at this time.  5. Hypokalemia. I will go ahead and replace her potassium and follow it in the morning. Magnesium level was normal.   CODE STATUS: The patient is a full code.   CRITICAL CARE TIME SPENT WITH THIS ADMISSION: 60 minutes.   ____________________________ Rolly Pancake. Cherlynn Kaiser, MD vjs:lt D: 08/03/2013 20:56:36 ET T: 08/03/2013 22:06:03 ET JOB#: [id number]  cc: Rolly Pancake. Cherlynn Kaiser, MD, <Dictator> Houston Siren  MD ELECTRONICALLY SIGNED 08/10/2013 18:51

## 2014-08-22 NOTE — Consult Note (Signed)
Brief Consult Note: Diagnosis: Schizoaffective disorder, Catatonia.   Patient was seen by consultant.   Consult note dictated.   Recommend further assessment or treatment.   Orders entered.   Comments: [name] has a h/o depression, psychosis and substance use admitted with AMS and BG of 18. She has been of her medications for the past 6 days while in CCU. Following extubation, the patient became catatonic. She had similar symptoms during her [facility].   PLAN: 1. We will start Ativan 1 mg im q 2 hours until cartatonia resolves.  2. We restart Haldol 5 mg im twice daily for psychosis.  3. I will follow along this weekend.  Electronic Signatures: Kristine LineaPucilowska, Cullen Vanallen (MD)  (Signed 10-Apr-15 17:40)  Authored: Brief Consult Note   Last Updated: 10-Apr-15 17:40 by Kristine LineaPucilowska, Kandie Keiper (MD)

## 2014-08-22 NOTE — Consult Note (Signed)
Brief Consult Note: Diagnosis: Schizoaffective disorder, Catatonia.   Consult note dictated.   Recommend further assessment or treatment.   Orders entered.   Comments: [name] has a h/o depression, psychosis and substance use admitted with AMS and BG of 18. She has been off her medications for the past several days while in CCU. Following extubation, the patient became catatonic. She had similar symptoms during [facility].  The patient continues to improve. She is now allowed po medications. I spoke extensively with the mother. The family wants substance abuse treatment. The patient in agreement now. She still awaits neurology clearance.  PLAN: 1. We will switch to Librium taper.    2. We will continue Haldol to 5 mg im at night, cogentin and Lithium for mood stabilization.    3. The family would like substance abuse treatment.   4. I will follow along.  Electronic Signatures: Janice Walls, Janice Walls (MD)  (Signed 12-Apr-15 18:16)  Authored: Brief Consult Note   Last Updated: 12-Apr-15 18:16 by Janice Walls, Janice Walls (MD)

## 2014-09-08 NOTE — H&P (Signed)
L&D Evaluation:  History:   HPI 29 yo G5P0131 at 6933w4d gestation based on EDC of 06/01/11 presenting after a ground level fall 24-hrs ago after slipping on icy steps.  Only intermitten contractions in the past week, no increase after fall.   She denies dysuria, hematuria, fevers, or chills. Denies abdominal pain but reports lower back pain.  [facility] but was not given narcotics and presents today requesting something for pain.  Patient reports +FM, no LOF, no VB.   The patient prenatal care is significant late entry to care and [facility] for acute episode of psychosis.  UDS at that time was positive for benzo's, opiates, and THC.    Presents with back pain    Patient's Medical History subtance abuse, depression/psychosis    Patient's Surgical History Previous C-Section    Medications Haldol, ativan, PNV    Allergies ASA    Social History drugs    Family History Non-Contributory   ROS:   ROS SEE HPI   Exam:   Vital Signs stable    Urine Protein not completed    General no apparent distress    Mental Status clear    Heart normal sinus rhythm    Abdomen gravid, tender with contractions    Estimated Fetal Weight Average for gestational age    Fetal Position vtx    Back no CVAT, paraspinal muscle tenderness to palpation    Edema no edema    Pelvic no external lesions, cervix closed and thick    Mebranes Intact    FHT normal rate with no decels    Ucx absent   Impression:   Impression Lumbago of pregnancy, MSK origin   Plan:   Plan EFM/NST    Comments 1) Lumbago - discussed supportive meassures tylenol, warm packs.  Discussed with patient no narcotics would be written for especially in light of her substance use history.  UDS sent but patient only provided us with scan specimen. 2) Fetus -reactive NST 3) Disposition - discharge home, has follow up on 05/09/2011    Follow Up Appointment already scheduled   Electronic  Signatures: Lorrene ReidStaebler, Kaileigh Viswanathan M (MD)  (Signed 07-Jan-14 20:21)  Authored: L&D Evaluation   Last Updated: 07-Jan-14 20:21 by Lorrene ReidStaebler, Cammi Consalvo M (MD)

## 2014-09-08 NOTE — H&P (Signed)
PATIENT NAMMarland Walls:  Glenna DurandSIMMONS, Kiarrah [id number] OF BIRTH:  12-12-1985 OF ADMISSION:  08/14/2013 PHYSICIAN:  Dr. Marijo SanesVivek SainaniPHYSICIAN:  Izak Anding B. Jennet MaduroPucilowska, MD DATA: Ms. Sharol HarnessSimmons is a 29 year old female with history of depression, anxiety, psychosis and substance abuse.  COMPLAINT: "I want help."   OF PRESENT ILLNESS:  Ms. Sharol HarnessSimmons was diagnosed with bipolar illness and has been a patient of [facility] Barn[facility]. She also has extensive history of prescription pill abuse. She was admitted to medical floor after overdose of unknown medications with blood glucose of 18, status post seizure episode and respiratory insufficiency requiring intubation. During her stay in CCU, her psychiatric medications were discontinued which led to development of catatonia. The mother was very familiar with the symptoms as the patient had a very similar episode a year ago when she was hospitalized [facility]. She was treated with ativan for catatonia. Her regular medications were restarted. Catatonia resolved but the patient had problems with swallowing, speech and memory. Once she resumed diate, she was transferred to psychiatry for further treatment. There is a vast history of substance abuse. She has been doing a little better lately, but the mother suspects that her current boyfriend is giving her drugs. We know that she ran out of prescribed Xanax 3 or 4 days prior to admission. She was, however, positive for benzodiazepines and narcotic pain killers. The mother confirmed that the patient has not been drinking extensively and that she has been using medications prescribed by Dr. Barn[facility]eman at [facility].  PSYCHIATRIC HISTORY: There are 2 psychiatric hospitalizations [facility], one associated with postpartum onset of depression with severe psychosis. The patient has been in care of Dr. Barnett AbuWiseman.  Apparently she has been compliant with medications per boyfriend.  PSYCHIATRIC HISTORY: On the mother's side, there was a "crazy" aunt. On  the father's side, there is a history of substance use.  MEDICAL HISTORY: None.   1.  ASPIRIN. 2.  LATUDA.  ON ADMISSION: Haldol 5 mg daily, Cogentin 1 mg daily, lithium 300 mg twice daily, Xanax 1 mg 4 times daily, Keppra 250 mg twice daily for 3 more days.  HISTORY: She lives with her boyfriend. Her mother lives 7 minutes away. The patient has 2 children, ages 10 years and 14 months. They are in the care of her parents. She is working third shift.  OF SYSTEMS: No fevers or chills. No weight changes. No double or blurred vision. No hearing loss. No shortness of breath or cough. No chest pain or orthopnea. No abdominal pain, nausea, vomiting, diarrhea. She is on blend diet.No incontinence or frequency. No heat or cold intolerance. No anemia or easy bruising. No acne or rash. No joint or muscle pain.  No weakness or numbness.  See history of present illness for details.  EXAMINATION:SIGNS: Blood pressure 121/84, pulse 86, respirations 21, temperature 99.5. This is a young female in no acute distress. The pupils are equal, round, and reactive to light. Sclerae anicteric. Supple. No thyromegaly. Clear to auscultation. No dullness to percussion. Regular rhythm and rate. No murmurs, rubs, or gallops. Soft, nontender, nondistended. Positive bowel sounds. No weakness.  No rashes or bruises. No cervical adenopathy. Cranial nerves II through XII are intact.  DATA: Chemistries are within normal limits except for potassium 3.4. Blood alcohol level on admission zero, blood glucose 18. LFTs within normal limits. Total CK 194, lithium level 0.27. Urine tox screen is positive for benzodiazepines, cannabinoids and opioids. CBC within normal limits except for white blood count of 22,000.  Urinalysis is not suggestive of urinary tract infection. Pregnancy test is negative. EKG: Sinus rhythm with short PR, otherwise normal EKG.  STATUS EXAMINATION ON ADMISSION: The patient is alert and oriented to person, place, and  situation. She is pleasant, polite, and cooperative. She is adequately groomed. She is wearing hospital scrubs. She maintains good eye contact. Her speech is of normal rhythm, rate, and volume. Mood is depressed with crying affect. Thought process is logical and goal oriented. Thought content: she denies thoughts of hurting herself or others but was admitted after a very serious overdose. There are no delusions or paranoia. There are no auditory or visual hallucinations. Her cognition is still impaired and she is unable to participate in the cognitive part of the interview. She is of average intelligence and fund of knowledge. Her  insight and judgment are poor.  I:   Bipolar I disorder depressed.  Polysubstance dependence.  AXIS II:   Deferred. III:  Seizure episode.   IV:  Mental and physical illness, substance abuse. V:  Global assessment of functioning on admission 25.  The patient was [facility] to [facility] for safety, stabilization, and medication management. He was initially placed on suicide precautions and was closely monitored for any unsafe behaviors. He underwent full psychiatric and risk assessment. He received pharmacotherapy, individual and group psychotherapy, substance abuse counseling, and support from therapeutic milieu.  Suicidal ideation: She denies. Mood: We restarted Haldol, Lithium and Cogentin. 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 Will continue. Substance abuse: She agrees to [facility] rehab. Seizures: Dr. Katrinka BlazingSmith, neurology recommended 3 more days of Keppra. Dispo: TBE.     Electronic Signatures: Kristine LineaPucilowska, Shanekqua Schaper (MD)  (Signed on 17-Apr-15 12:28)  Authored  Last Updated: 17-Apr-15 12:28 by Kristine LineaPucilowska, Jashun Puertas (MD)

## 2015-01-26 ENCOUNTER — Emergency Department: Payer: Medicaid Other

## 2015-01-26 ENCOUNTER — Emergency Department
Admission: EM | Admit: 2015-01-26 | Discharge: 2015-01-26 | Disposition: A | Discharge status: Home / Self Care | Payer: Medicaid Other | Attending: Emergency Medicine | Admitting: Emergency Medicine

## 2015-01-26 ENCOUNTER — Encounter: Payer: Self-pay | Admitting: Emergency Medicine

## 2015-01-26 DIAGNOSIS — M549 Dorsalgia, unspecified: Secondary | ICD-10-CM | POA: Diagnosis not present

## 2015-01-26 DIAGNOSIS — Z3202 Encounter for pregnancy test, result negative: Secondary | ICD-10-CM | POA: Diagnosis not present

## 2015-01-26 DIAGNOSIS — R112 Nausea with vomiting, unspecified: Secondary | ICD-10-CM | POA: Insufficient documentation

## 2015-01-26 DIAGNOSIS — R197 Diarrhea, unspecified: Secondary | ICD-10-CM | POA: Insufficient documentation

## 2015-01-26 DIAGNOSIS — R1110 Vomiting, unspecified: Secondary | ICD-10-CM

## 2015-01-26 DIAGNOSIS — Z79899 Other long term (current) drug therapy: Secondary | ICD-10-CM | POA: Diagnosis not present

## 2015-01-26 DIAGNOSIS — R109 Unspecified abdominal pain: Secondary | ICD-10-CM | POA: Diagnosis not present

## 2015-01-26 DIAGNOSIS — R1013 Epigastric pain: Secondary | ICD-10-CM | POA: Diagnosis not present

## 2015-01-26 DIAGNOSIS — F1210 Cannabis abuse, uncomplicated: Secondary | ICD-10-CM | POA: Insufficient documentation

## 2015-01-26 DIAGNOSIS — F1410 Cocaine abuse, uncomplicated: Secondary | ICD-10-CM | POA: Diagnosis not present

## 2015-01-26 DIAGNOSIS — Z720 Tobacco use: Secondary | ICD-10-CM | POA: Insufficient documentation

## 2015-01-26 LAB — COMPREHENSIVE METABOLIC PANEL
ALBUMIN: 4.2 g/dL (ref 3.5–5.0)
ALT: 14 U/L (ref 14–54)
ANION GAP: 10 (ref 5–15)
AST: 24 U/L (ref 15–41)
Alkaline Phosphatase: 65 U/L (ref 38–126)
BILIRUBIN TOTAL: 0.8 mg/dL (ref 0.3–1.2)
BUN: 15 mg/dL (ref 6–20)
CO2: 21 mmol/L — ABNORMAL LOW (ref 22–32)
Calcium: 9 mg/dL (ref 8.9–10.3)
Chloride: 109 mmol/L (ref 101–111)
Creatinine, Ser: 0.62 mg/dL (ref 0.44–1.00)
GFR calc non Af Amer: >60 mL/min (ref >60)
GLUCOSE: 105 mg/dL — AB (ref 65–99)
POTASSIUM: 4 mmol/L (ref 3.5–5.1)
Sodium: 140 mmol/L (ref 135–145)
Total Protein: 7.4 g/dL (ref 6.5–8.1)

## 2015-01-26 LAB — URINE DRUG SCREEN, QUALITATIVE (ARMC ONLY)
Amphetamines, Ur Screen: NOT DETECTED
BENZODIAZEPINE, UR SCRN: NOT DETECTED
Barbiturates, Ur Screen: NOT DETECTED
CANNABINOID 50 NG, UR ~~LOC~~: POSITIVE — AB
Cocaine Metabolite,Ur ~~LOC~~: POSITIVE — AB
MDMA (Ecstasy)Ur Screen: NOT DETECTED
Methadone Scn, Ur: NOT DETECTED
OPIATE, UR SCREEN: NOT DETECTED
PHENCYCLIDINE (PCP) UR S: NOT DETECTED
Tricyclic, Ur Screen: NOT DETECTED

## 2015-01-26 LAB — URINALYSIS COMPLETE WITH MICROSCOPIC (ARMC ONLY)
BILIRUBIN URINE: NEGATIVE
Glucose, UA: NEGATIVE mg/dL
Hgb urine dipstick: NEGATIVE
LEUKOCYTES UA: NEGATIVE
NITRITE: NEGATIVE
PH: 5 (ref 5.0–8.0)
PROTEIN: 30 mg/dL — AB
SPECIFIC GRAVITY, URINE: 1.028 (ref 1.005–1.030)
WBC UA: NONE SEEN WBC/hpf (ref 0–5)

## 2015-01-26 LAB — POCT PREGNANCY, URINE: PREG TEST UR: NEGATIVE

## 2015-01-26 LAB — CBC
HEMATOCRIT: 50.3 % — AB (ref 35.0–47.0)
HEMOGLOBIN: 17 g/dL — AB (ref 12.0–16.0)
MCH: 31.3 pg (ref 26.0–34.0)
MCHC: 33.8 g/dL (ref 32.0–36.0)
MCV: 92.7 fL (ref 80.0–100.0)
Platelets: 208 10*3/uL (ref 150–440)
RBC: 5.42 MIL/uL — AB (ref 3.80–5.20)
RDW: 14.1 % (ref 11.5–14.5)
WBC: 15.8 10*3/uL — AB (ref 3.6–11.0)

## 2015-01-26 LAB — LIPASE, BLOOD: Lipase: 27 U/L (ref 22–51)

## 2015-01-26 MED ORDER — ACETAMINOPHEN 325 MG PO TABS
650.0000 mg | ORAL_TABLET | Freq: Once | ORAL | Status: AC
  Administered 2015-01-26: 650 mg via ORAL

## 2015-01-26 MED ORDER — METOCLOPRAMIDE HCL 5 MG/ML IJ SOLN
10.0000 mg | Freq: Once | INTRAMUSCULAR | Status: AC
  Administered 2015-01-26: 10 mg via INTRAVENOUS
  Filled 2015-01-26: qty 2

## 2015-01-26 MED ORDER — ONDANSETRON HCL 4 MG/2ML IJ SOLN: 4.0000 mg | Freq: Once | INTRAMUSCULAR | Status: AC

## 2015-01-26 MED ORDER — ONDANSETRON HCL 4 MG/2ML IJ SOLN
INTRAMUSCULAR | Status: AC
  Administered 2015-01-26: 4 mg
  Filled 2015-01-26: qty 2

## 2015-01-26 MED ORDER — SODIUM CHLORIDE 0.9 % IV SOLN
Freq: Once | INTRAVENOUS | Status: AC
  Administered 2015-01-26: 06:00:00 via INTRAVENOUS

## 2015-01-26 MED ORDER — ONDANSETRON 4 MG PO TBDP
4.0000 mg | ORAL_TABLET | Freq: Once | ORAL | Status: AC
  Administered 2015-01-26: 4 mg via ORAL
  Filled 2015-01-26: qty 1

## 2015-01-26 MED ORDER — IOHEXOL 300 MG/ML  SOLN
100.0000 mL | Freq: Once | INTRAMUSCULAR | Status: AC | PRN
  Administered 2015-01-26: 100 mL via INTRAVENOUS

## 2015-01-26 MED ORDER — ACETAMINOPHEN 325 MG PO TABS
ORAL_TABLET | ORAL | Status: AC
  Filled 2015-01-26: qty 2

## 2015-01-26 MED ORDER — ONDANSETRON 4 MG PO TBDP
ORAL_TABLET | ORAL | Status: AC
  Filled 2015-01-26: qty 1

## 2015-01-26 MED ORDER — ONDANSETRON 4 MG PO TBDP
4.0000 mg | ORAL_TABLET | Freq: Once | ORAL | Status: AC | PRN
  Administered 2015-01-26: 4 mg via ORAL

## 2015-01-26 MED ORDER — ONDANSETRON 4 MG PO TBDP: 4.0000 mg | ORAL_TABLET | Freq: Four times a day (QID) | ORAL | Status: DC | PRN

## 2015-01-26 MED ORDER — IOHEXOL 240 MG/ML SOLN
25.0000 mL | Freq: Once | INTRAMUSCULAR | Status: DC | PRN
  Administered 2015-01-26: 25 mL via ORAL
  Filled 2015-01-26: qty 50

## 2015-01-26 NOTE — ED Notes (Signed)
Called lab to check status of previously entered orders; spoke with Gabriel Rung - advised that they "had no blood, but her would continue to look." ED charge nurse made aware.

## 2015-01-26 NOTE — ED Notes (Addendum)
Called again to check status of labs. This RN attempted to obtain new samples from PIV; minimal sample collected in green top tube; will send to see if lab can run MD ordered studies. Lab now advising that they "found a blue and red top tube, but that is all".  RN asked that phlebotomy be sent down to obtain further samples.

## 2015-01-26 NOTE — Discharge Instructions (Signed)

## 2015-01-26 NOTE — ED Provider Notes (Signed)
IMPRESSION: No acute abnormality or finding to explain the patient's symptoms. Negative exam.  ----------------------------------------- 11:30 AM on 01/26/2015 -----------------------------------------  Patient CT scan is reassuring. She reports she feels much improved, she does still have some slight nausea but is able take by mouth well. She is awake alert in no distress. I will discharge the patient with prescription for Zofran. Close return precautions and follow-up care advise.  Impression Vomiting and diarrhea  Sharyn Creamer, MD 01/26/15 1130

## 2015-01-26 NOTE — ED Notes (Signed)
Pt presents to ED with frequent vomiting since around 1600 Monday.  Pt reports burning and cramping in her abd. States she has "not been using any drugs or anything" and that she has just been vomiting. States she feels like her abd is "swollen". Pt alert at this time. Actively vomiting during triage.

## 2015-01-26 NOTE — ED Notes (Signed)
Patient transported to CT 

## 2015-01-26 NOTE — ED Notes (Signed)
Pt given PO tylenol per request, see MAR. Pt resting at this time.

## 2015-01-26 NOTE — ED Notes (Signed)
Pt returns from CT at this time.

## 2015-01-26 NOTE — ED Provider Notes (Signed)
[facility] Emergency Department Provider Note  ____________________________________________  Time seen: Approximately 6:06 AM  I have reviewed the triage vital signs and the nursing notes.   HISTORY  Chief Complaint Emesis    HPI Janice Walls is a 29 y.o. female who comes into the hospital with vomiting. The patient reports that she's been vomiting for the past 12 hours and she feels weak. The patient reports that she has back and abdominal pain. She also reports that she's had about 3 episodes of diarrhea. The patient denies any sick contacts but reports she has been vomiting for every 15 minutes. The emesis has been white appearing and red after drinking some Kool-Aid. The patient has been unable to keep things down. She also reports that she noticed some blood in her stool when she had the last episode. The patient has some pain in her abdomen and in her upper and lower abdomen. She reports that the pain is a 10 out of 10 in intensity. It was not hurting when she initially arrived but it seemed to worse now. The patient also reports she has some crampy abdominal pain. The patient was unsure what to do so she decided to come in for evaluation.   History reviewed. No pertinent past medical history.  There are no active problems to display for this patient.   Past Surgical History  Procedure Laterality Date  . Cesarean section    . Cesarean section      Current Outpatient Rx  Name  Route  Sig  Dispense  Refill  . acetaminophen (TYLENOL) 500 MG tablet   Oral   Take 1,000 mg by mouth as needed. For pain         . methocarbamol (ROBAXIN) 500 MG tablet      2 po tid for spasm   30 tablet   0   . EXPIRED: omeprazole (PRILOSEC) 20 MG capsule   Oral   Take 1 capsule (20 mg total) by mouth daily.   30 capsule   0     Allergies Aspirin and Morphine and related  No family history on file.  Social History Social History  Substance Use  Topics  . Smoking status: Current Some Day Smoker -- 0.50 packs/day    Types: Cigarettes  . Smokeless tobacco: None  . Alcohol Use: No    Review of Systems Constitutional: No fever/chills Eyes: No visual changes. ENT: No sore throat. Cardiovascular: Denies chest pain. Respiratory: Denies shortness of breath. Gastrointestinal: Abdominal pain, nausea, vomiting, diarrhea Genitourinary: Negative for dysuria. Musculoskeletal: Back pain Skin: Negative for rash. Neurological: Negative for headaches, focal weakness or numbness.  10-point ROS otherwise negative.  ____________________________________________   PHYSICAL EXAM:  VITAL SIGNS: ED Triage Vitals  Enc Vitals Group     BP 01/26/15 0132 113/79 mmHg     Pulse Rate 01/26/15 0132 67     Resp 01/26/15 0132 18     Temp 01/26/15 0132 97.5 F (36.4 C)     Temp Source 01/26/15 0132 Oral     SpO2 01/26/15 0132 98 %     Weight 01/26/15 0132 140 lb (63.504 kg)     Height 01/26/15 0132  (1.575 m)     Head Cir --      Peak Flow --      Pain Score 01/26/15 0132 8     Pain Loc --      Pain Edu? --      Excl. in GC? --  Constitutional: Alert and oriented. Well appearing and in moderate distress. Eyes: Conjunctivae are normal. PERRL. EOMI. Head: Atraumatic. Nose: No congestion/rhinnorhea. Mouth/Throat: Mucous membranes are moist.  Oropharynx non-erythematous. Cardiovascular: Normal rate, regular rhythm. Grossly normal heart sounds.  Good peripheral circulation. Respiratory: Normal respiratory effort.  No retractions. Lungs CTAB. Gastrointestinal: Soft with epigastric tenderness to palpation. No distention. Positive bowel sounds Musculoskeletal: No lower extremity tenderness nor edema.  Neurologic:  Normal speech and language.  Skin:  Skin is warm, dry and intact.  Psychiatric: Mood and affect are normal.   ____________________________________________   LABS (all labs ordered are listed, but only abnormal results are  displayed)  Labs Reviewed  URINALYSIS COMPLETEWITH MICROSCOPIC ([facility] ONLY) - Abnormal; Notable for the following:    Color, Urine YELLOW (*)    APPearance TURBID (*)    Ketones, ur 2+ (*)    Protein, ur 30 (*)    Bacteria, UA FEW (*)    Squamous Epithelial / LPF 0-5 (*)    All other components within normal limits  LIPASE, BLOOD  COMPREHENSIVE METABOLIC PANEL  CBC  URINE DRUG SCREEN, QUALITATIVE ([facility] ONLY)   ____________________________________________  EKG  ED ECG REPORT I, Rebecka Apley, the attending physician, personally viewed and interpreted this ECG.   Date: 01/26/2015  EKG Time: 712  Rate: 56  Rhythm: normal sinus rhythm  Axis: normal  Intervals:none  ST&T Change: none  ____________________________________________  RADIOLOGY  CT abd ____________________________________________   PROCEDURES  Procedure(s) performed: None  Critical Care performed: No  ____________________________________________   INITIAL IMPRESSION / ASSESSMENT AND PLAN / ED COURSE  Pertinent labs & imaging results that were available during my care of the patient were reviewed by me and considered in my medical decision making (see chart for details).  This is a 29 year old female who comes in today with some vomiting diarrhea and abdominal pain. The patient received a dose of normal saline and Zofran. We will hold off on pain medication to determine if the patient's pain subsides with the fluid and hydration. I will await the blood work results and then determine if the patient needs any further testing or imaging.  I will perform a CT on the patient's abdomen since she is continuing to vomit and has pain. The patient will be signed out to Dr. Fanny Bien who will follow up the result and disposition the patient. ____________________________________________   FINAL CLINICAL IMPRESSION(S) / ED DIAGNOSES  Final diagnoses:  Epigastric pain  Vomiting and diarrhea      Rebecka Apley, MD 01/26/15 [phone]

## 2015-02-12 ENCOUNTER — Emergency Department
Admission: EM | Admit: 2015-02-12 | Discharge: 2015-02-12 | Disposition: A | Discharge status: Home / Self Care | Payer: Medicaid Other | Attending: Emergency Medicine | Admitting: Emergency Medicine

## 2015-02-12 ENCOUNTER — Emergency Department: Payer: Medicaid Other

## 2015-02-12 DIAGNOSIS — R1012 Left upper quadrant pain: Secondary | ICD-10-CM | POA: Diagnosis present

## 2015-02-12 DIAGNOSIS — Z79899 Other long term (current) drug therapy: Secondary | ICD-10-CM | POA: Diagnosis not present

## 2015-02-12 DIAGNOSIS — K859 Acute pancreatitis without necrosis or infection, unspecified: Secondary | ICD-10-CM | POA: Insufficient documentation

## 2015-02-12 DIAGNOSIS — Z720 Tobacco use: Secondary | ICD-10-CM | POA: Insufficient documentation

## 2015-02-12 LAB — URINALYSIS COMPLETE WITH MICROSCOPIC (ARMC ONLY)
GLUCOSE, UA: NEGATIVE mg/dL
Hgb urine dipstick: NEGATIVE
NITRITE: NEGATIVE
PH: 6 (ref 5.0–8.0)
Protein, ur: 100 mg/dL — AB
Specific Gravity, Urine: 1.03 (ref 1.005–1.030)

## 2015-02-12 LAB — COMPREHENSIVE METABOLIC PANEL
ALBUMIN: 4.2 g/dL (ref 3.5–5.0)
ALK PHOS: 72 U/L (ref 38–126)
ALT: 12 U/L — ABNORMAL LOW (ref 14–54)
ANION GAP: 12 (ref 5–15)
AST: 27 U/L (ref 15–41)
BILIRUBIN TOTAL: 1.7 mg/dL — AB (ref 0.3–1.2)
BUN: 14 mg/dL (ref 6–20)
CALCIUM: 9.1 mg/dL (ref 8.9–10.3)
CO2: 30 mmol/L (ref 22–32)
Chloride: 94 mmol/L — ABNORMAL LOW (ref 101–111)
Creatinine, Ser: 0.85 mg/dL (ref 0.44–1.00)
GFR calc non Af Amer: >60 mL/min (ref >60)
GLUCOSE: 101 mg/dL — AB (ref 65–99)
Potassium: 4 mmol/L (ref 3.5–5.1)
Sodium: 136 mmol/L (ref 135–145)
TOTAL PROTEIN: 7.5 g/dL (ref 6.5–8.1)

## 2015-02-12 LAB — CBC
HCT: 47.9 % — ABNORMAL HIGH (ref 35.0–47.0)
HEMOGLOBIN: 16.1 g/dL — AB (ref 12.0–16.0)
MCH: 30.7 pg (ref 26.0–34.0)
MCHC: 33.7 g/dL (ref 32.0–36.0)
MCV: 91 fL (ref 80.0–100.0)
PLATELETS: 224 10*3/uL (ref 150–440)
RBC: 5.26 MIL/uL — ABNORMAL HIGH (ref 3.80–5.20)
RDW: 13.7 % (ref 11.5–14.5)
WBC: 15.4 10*3/uL — ABNORMAL HIGH (ref 3.6–11.0)

## 2015-02-12 LAB — LIPASE, BLOOD: Lipase: 119 U/L — ABNORMAL HIGH (ref 22–51)

## 2015-02-12 MED ORDER — FENTANYL CITRATE (PF) 100 MCG/2ML IJ SOLN
50.0000 ug | Freq: Once | INTRAMUSCULAR | Status: AC
  Administered 2015-02-12: 50 ug via INTRAVENOUS
  Filled 2015-02-12: qty 2

## 2015-02-12 MED ORDER — OXYCODONE-ACETAMINOPHEN 5-325 MG PO TABS
2.0000 | ORAL_TABLET | Freq: Once | ORAL | Status: AC
Start: 2015-02-12 — End: 2015-02-12
  Administered 2015-02-12: 2 via ORAL
  Filled 2015-02-12: qty 2

## 2015-02-12 MED ORDER — OXYCODONE-ACETAMINOPHEN 5-325 MG PO TABS: 1.0000 | ORAL_TABLET | Freq: Four times a day (QID) | ORAL | Status: DC | PRN

## 2015-02-12 MED ORDER — PROMETHAZINE HCL 25 MG/ML IJ SOLN
25.0000 mg | Freq: Once | INTRAMUSCULAR | Status: AC
  Administered 2015-02-12: 25 mg via INTRAVENOUS
  Filled 2015-02-12: qty 1

## 2015-02-12 MED ORDER — GI COCKTAIL ~~LOC~~
30.0000 mL | Freq: Once | ORAL | Status: AC
  Administered 2015-02-12: 30 mL via ORAL
  Filled 2015-02-12: qty 30

## 2015-02-12 MED ORDER — SODIUM CHLORIDE 0.9 % IV BOLUS (SEPSIS)
1000.0000 mL | Freq: Once | INTRAVENOUS | Status: AC
  Administered 2015-02-12: 1000 mL via INTRAVENOUS

## 2015-02-12 MED ORDER — ONDANSETRON HCL 4 MG/2ML IJ SOLN
4.0000 mg | Freq: Once | INTRAMUSCULAR | Status: AC
  Administered 2015-02-12: 4 mg via INTRAVENOUS
  Filled 2015-02-12: qty 2

## 2015-02-12 NOTE — ED Provider Notes (Signed)
[facility] Emergency Department Provider Note  Time seen: 11:42 AM  I have reviewed the triage vital signs and the nursing notes.   HISTORY  Chief Complaint Flank Pain and Abdominal Pain    HPI Janice Walls is a 29 y.o. female with no past medical history who presents the emergency department with nausea, vomiting, left flank pain. According to the patient for the past 3 weeks she has had intermittent left flank pain, as well as nausea and vomiting. The patient was seen here 2 weeks ago for the same, had labs showing an elevated white blood cell count, but otherwise within normal limits. Had a CT scan showing no acute abnormality. No kidney stones. Patient states her symptoms seem to have resolved, however beginning yesterday morning she once again became very nauseated with vomiting. States for the past 2 days she has not kept anything down, cannot keep fluids down. States mild to moderate left flank pain, mostly left upper quadrant. Denies diarrhea. Denies black or bloody stool. Denies dysuria, vaginal bleeding or discharge.    No past medical history on file.  There are no active problems to display for this patient.   Past Surgical History  Procedure Laterality Date  . Cesarean section    . Cesarean section      Current Outpatient Rx  Name  Route  Sig  Dispense  Refill  . acetaminophen (TYLENOL) 500 MG tablet   Oral   Take 1,000 mg by mouth as needed. For pain         . methocarbamol (ROBAXIN) 500 MG tablet      2 po tid for spasm   30 tablet   0   . EXPIRED: omeprazole (PRILOSEC) 20 MG capsule   Oral   Take 1 capsule (20 mg total) by mouth daily.   30 capsule   0   . ondansetron (ZOFRAN ODT) 4 MG disintegrating tablet   Oral   Take 1 tablet (4 mg total) by mouth every 6 (six) hours as needed for nausea or vomiting.   20 tablet   0     Allergies Aspirin and Morphine and related  No family history on file.  Social  History Social History  Substance Use Topics  . Smoking status: Current Some Day Smoker -- 0.50 packs/day    Types: Cigarettes  . Smokeless tobacco: Not on file  . Alcohol Use: No    Review of Systems Constitutional: Negative for fever. Cardiovascular: Negative for chest pain. Respiratory: Negative for shortness of breath. Gastrointestinal: Positive left upper quadrant abdominal pain. Positive nausea and vomiting. Negative diarrhea. Genitourinary: Negative for dysuria. Negative bleeding or discharge. Musculoskeletal: Moderate left flank pain. Neurological: Negative for headache 10-point ROS otherwise negative.  ____________________________________________   PHYSICAL EXAM:  VITAL SIGNS: ED Triage Vitals  Enc Vitals Group     BP 02/12/15 1014 122/70 mmHg     Pulse Rate 02/12/15 1014 67     Resp 02/12/15 1014 20     Temp 02/12/15 1014 98.4 F (36.9 C)     Temp Source 02/12/15 1014 Oral     SpO2 02/12/15 1014 96 %     Weight 02/12/15 1014 138 lb (62.596 kg)     Height 02/12/15 1014  (1.575 m)     Head Cir --      Peak Flow --      Pain Score 02/12/15 1015 10     Pain Loc --      Pain  Edu? --      Excl. in GC? --     Constitutional: Alert and oriented. Well appearing and in no distress. Eyes: Normal exam ENT   Head: Normocephalic and atraumatic   Mouth/Throat: Mucous membranes are moist. Cardiovascular: Normal rate, regular rhythm. No murmur Respiratory: Normal respiratory effort without tachypnea nor retractions. Breath sounds are clear Gastrointestinal: Soft, moderate epigastric to left upper quadrant tenderness to palpation. No distention. No rebound, no guarding. No CVA tenderness. Musculoskeletal: Nontender with normal range of motion in all extremities.  Neurologic:  Normal speech and language. No gross focal neurologic deficits  Psychiatric: Mood and affect are normal. Speech and behavior are normal. ____________________________________________     INITIAL IMPRESSION / ASSESSMENT AND PLAN / ED COURSE  Pertinent labs & imaging results that were available during my care of the patient were reviewed by me and considered in my medical decision making (see chart for details).  Patient present to the emergency department nausea and vomiting 48 hours. Also states left upper quadrant abdominal pain. Patient was seen for the same approximately 2 weeks ago with a negative workup including negative CT. I discussed the possibility of repeating a CT scan, the patient strongly wishes to avoid further radiation. We will check labs, control pain and nausea, IV hydrate, and closely monitor in the emergency department.  Ultrasound within normal limits. Lipase elevated consistent with mild pancreatitis.  Patient states the pain is largely gone at this time. I discussed with the patient clear liquid diet for next 48 hours then reintroduced and left starches, limiting any type of fatty foods for at least one week. Patient agreeable to plan. Follow-up with a primary care physician this week. We'll discharge on Percocet as needed. Patient agreeable. Discussed very strict return precautions.  ____________________________________________   FINAL CLINICAL IMPRESSION(S) / ED DIAGNOSES  Left flank pain Nausea and vomiting Pancreatitis  Minna AntisKevin Kura Bethards, MD 02/12/15 1437

## 2015-02-12 NOTE — ED Notes (Signed)
Pt states she woke up about 2 days ago vomiting, states she also is having flank and abd pain states she is unable to hold anything down.

## 2015-02-12 NOTE — Discharge Instructions (Signed)
Acute Pancreatitis  Acute pancreatitis is a disease in which the pancreas becomes suddenly irritated (inflamed). The pancreas is a large gland behind your stomach. The pancreas makes enzymes that help digest food. The pancreas also makes 2 hormones that help control your blood sugar. Acute pancreatitis happens when the enzymes attack and damage the pancreas. Most attacks last a couple of days and can cause serious problems.  HOME CARE  · Follow your doctor's diet instructions. You may need to avoid alcohol and limit fat in your diet.  · Eat small meals often.  · Drink enough fluids to keep your pee (urine) clear or pale yellow.  · Only take medicines as told by your doctor.  · Avoid drinking alcohol if it caused your disease.  · Do not smoke.  · Get plenty of rest.  · Check your blood sugar at home as told by your doctor.  · Keep all doctor visits as told.  GET HELP IF:  · You do not get better as quickly as expected.  · You have new or worsening symptoms.  · You have lasting pain, weakness, or feel sick to your stomach (nauseous).  · You get better and then have another pain attack.  GET HELP RIGHT AWAY IF:   · You are unable to eat or keep fluids down.  · Your pain becomes severe.  · You have a fever or lasting symptoms for more than 2 to 3 days.  · You have a fever and your symptoms suddenly get worse.  · Your skin or the white part of your eyes turn yellow (jaundice).  · You throw up (vomit).  · You feel dizzy, or you pass out (faint).  · Your blood sugar is high (over 300 mg/dL).  MAKE SURE YOU:   · Understand these instructions.  · Will watch your condition.  · Will get help right away if you are not doing well or get worse.     This information is not intended to replace advice given to you by your health care provider. Make sure you discuss any questions you have with your health care provider.     Document Released: 10/04/2007 Document Revised: 05/08/2014 Document Reviewed: 07/27/2011  Elsevier Interactive  Patient Education ©2016 [employer]

## 2015-02-12 NOTE — ED Notes (Signed)
Pt c/o left flank pain, abdominal pain, left back pain, chills, NV for the past 48 hours. Pt reports here for same symptoms 2-3 weeks ago. Pt also reports difficulty with urination. Denies frequency or urgency.

## 2015-06-29 IMAGING — CR DG CHEST 1V PORT
1 series · 1 of 1 positions shown · non-contrast
Comparison: 08/03/2013 at [DATE] p.m.

CLINICAL DATA: Altered mental status.

EXAM:
PORTABLE CHEST - 1 VIEW [DATE] p.m.

[ap]
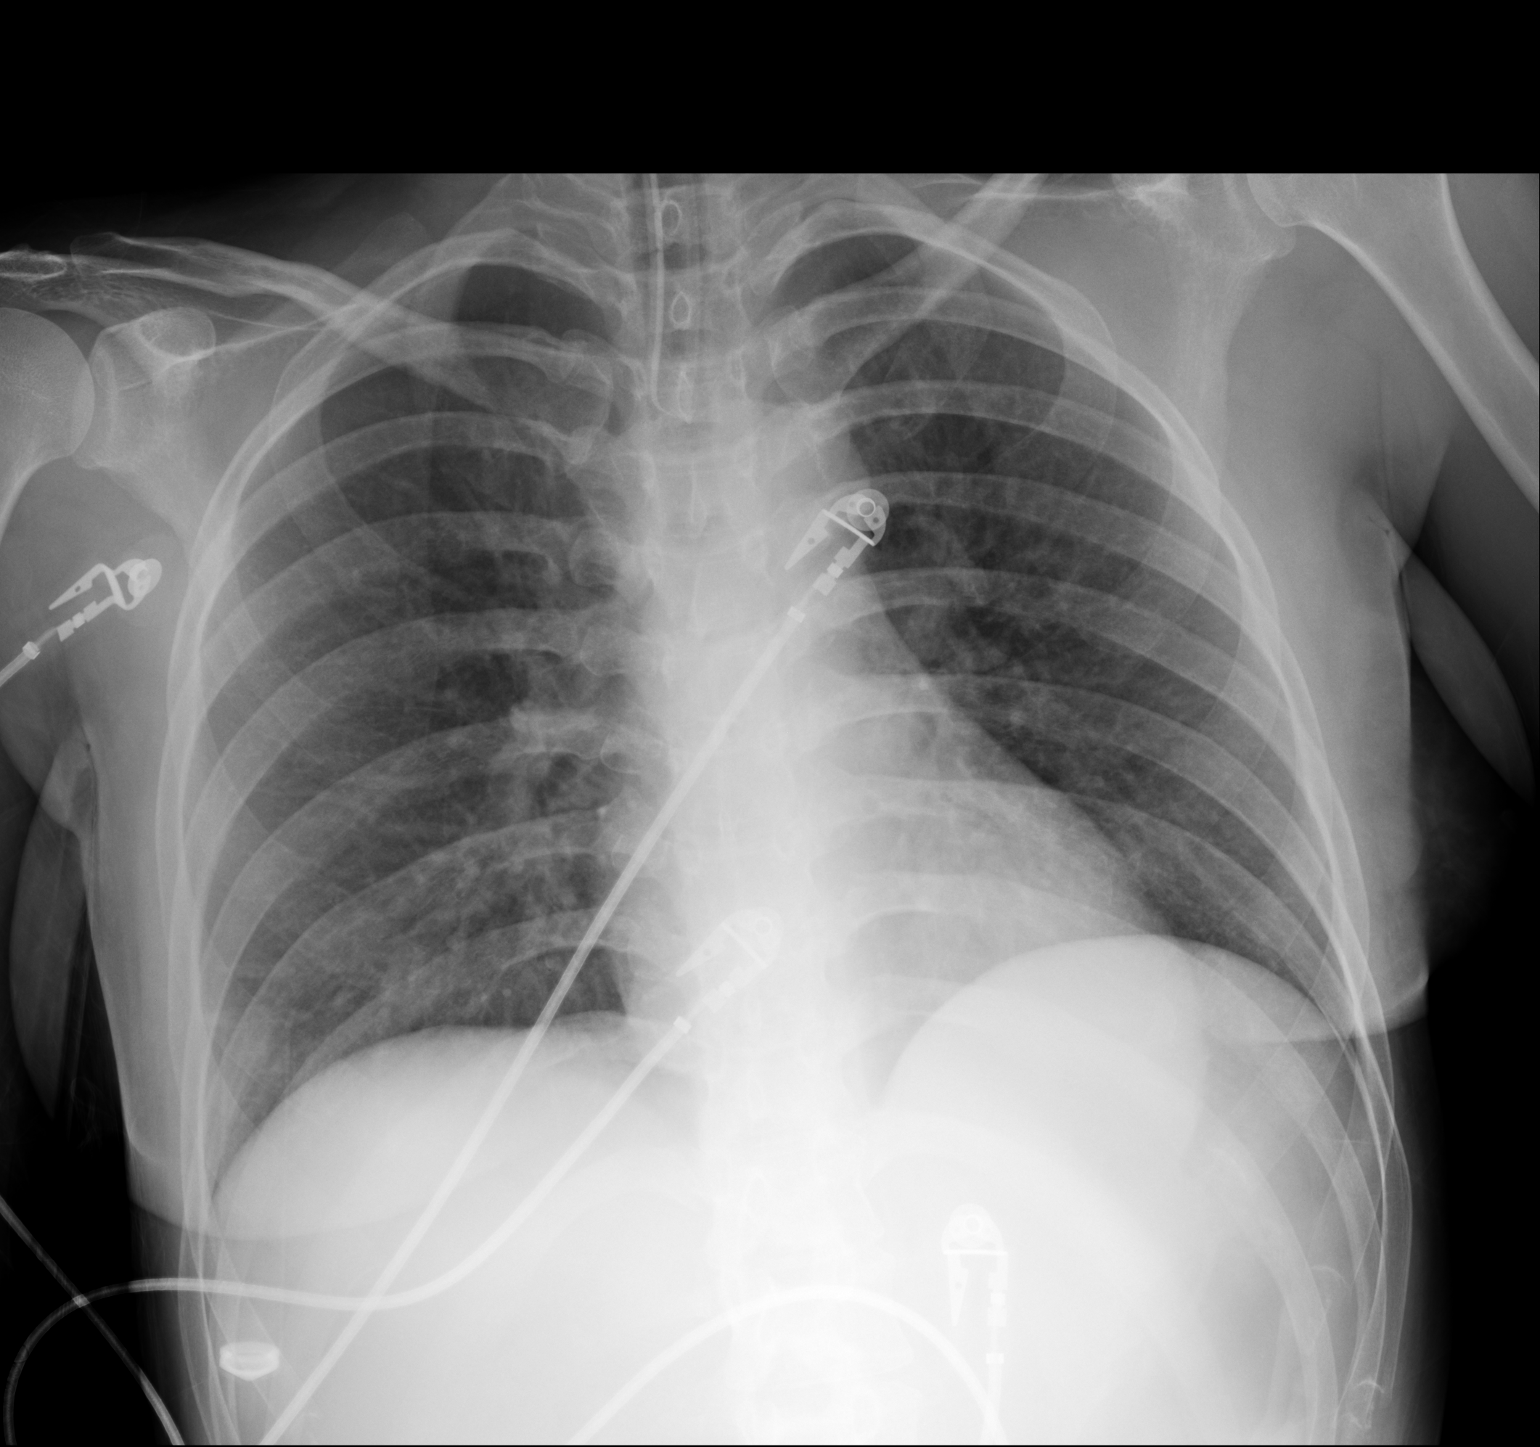

[1 of 1 positions shown; findings below may reference images not displayed]

FINDINGS: Endotracheal tube has been inserted and appears in good position.
Heart size and vascularity are normal. Lungs are clear. No osseous
abnormality.
IMPRESSION: Endotracheal tube in good position.  Otherwise, normal chest.

## 2015-06-29 IMAGING — CR DG CHEST 1V PORT
1 series · 1 of 1 positions shown · non-contrast
Comparison: 08/03/2013 at [DATE] p.m.

CLINICAL DATA: Central line placement.

EXAM:
PORTABLE CHEST - 1 VIEW

[ap]
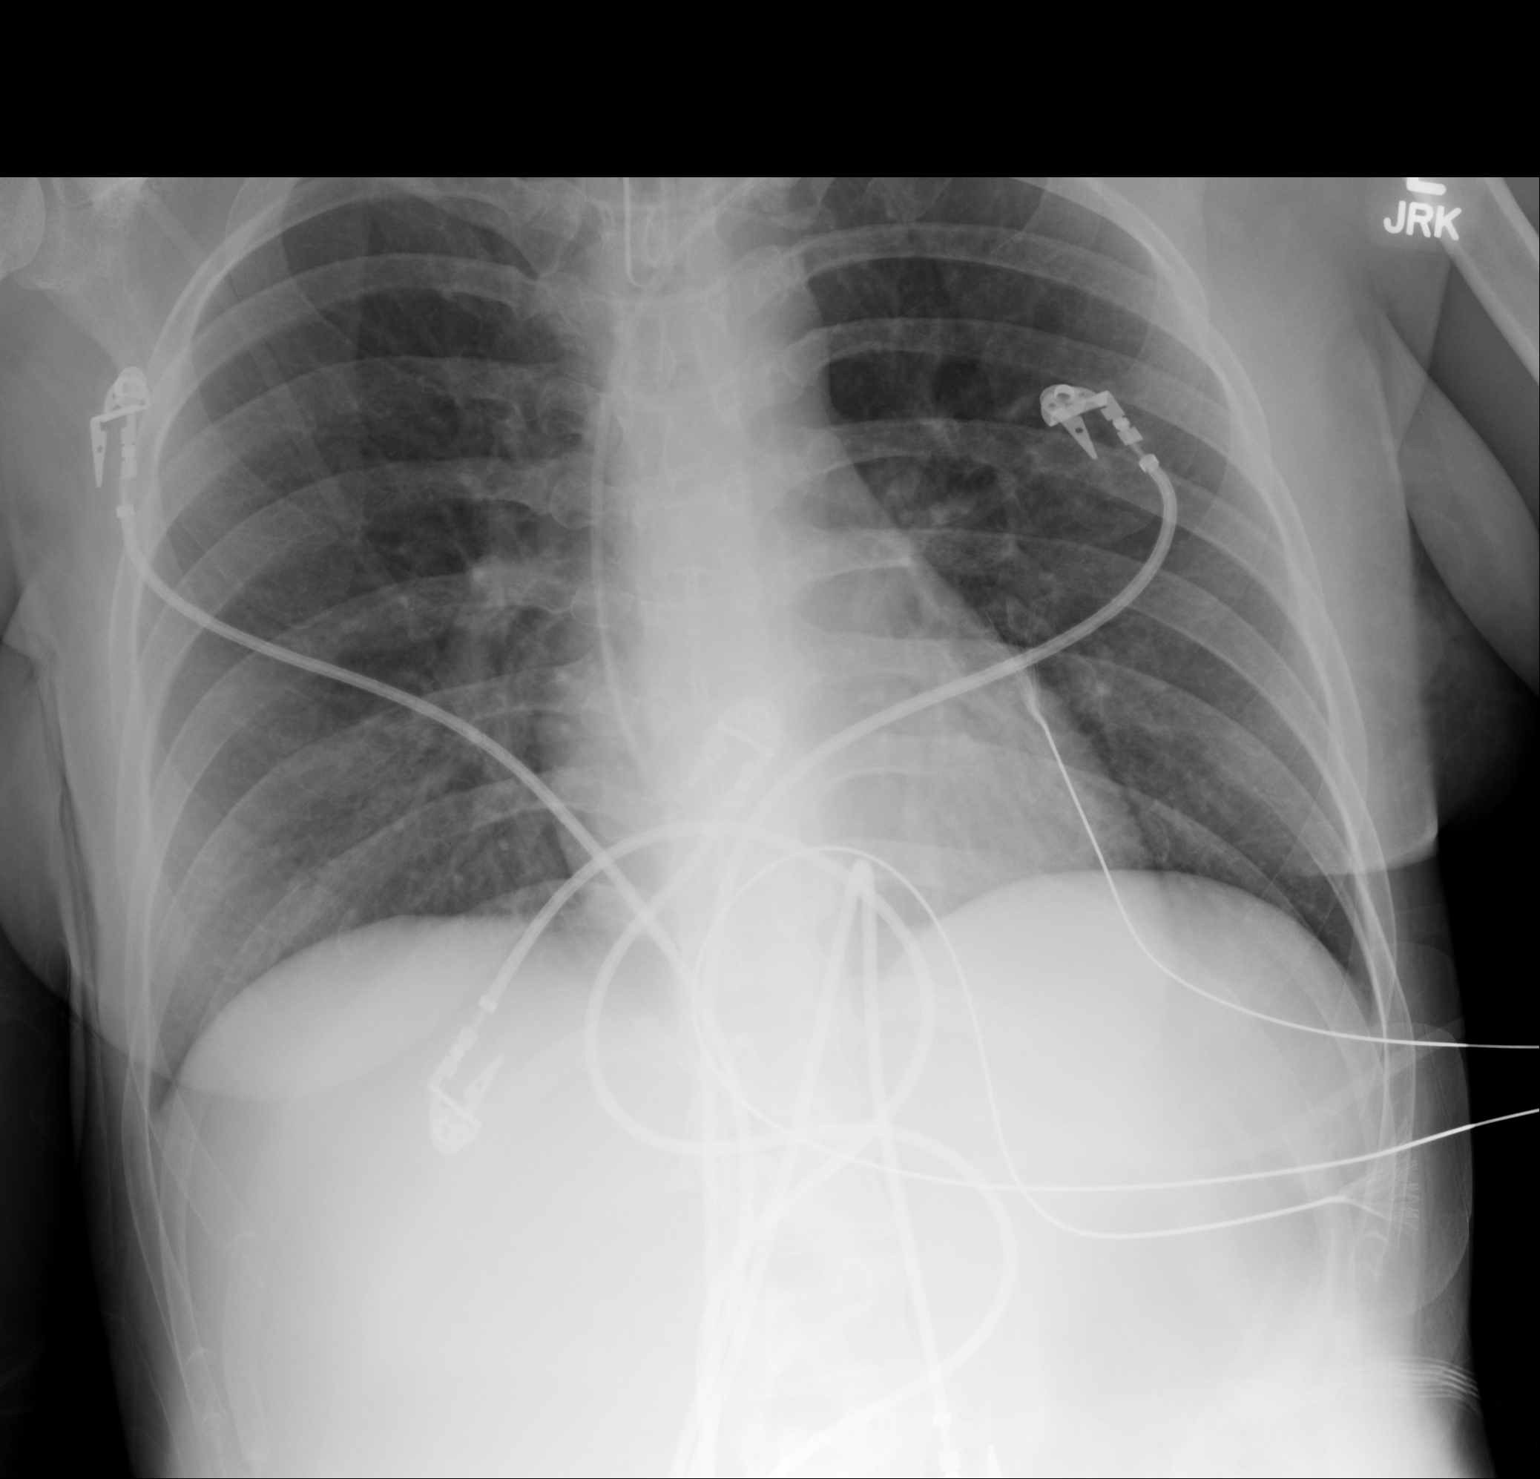

[1 of 1 positions shown; findings below may reference images not displayed]

FINDINGS: Left subclavian central line is present with tip over the right
atrium just below the cavoatrial junction. Endotracheal tube is
unchanged. Lungs are adequately inflated without consolidation or
effusion. There is no left-sided pneumothorax. Cardiomediastinal
silhouette and remainder of the exam is unchanged.
IMPRESSION: No acute cardiopulmonary disease.

Tubes and lines as described. Placement of left subclavian central
venous catheter with tip over the right atrium just below the
cavoatrial junction. No pneumothorax.

## 2015-07-16 ENCOUNTER — Emergency Department: Payer: Medicaid Other

## 2015-07-16 ENCOUNTER — Encounter: Payer: Self-pay | Admitting: Emergency Medicine

## 2015-07-16 ENCOUNTER — Emergency Department
Admission: EM | Admit: 2015-07-16 | Discharge: 2015-07-16 | Disposition: A | Discharge status: Home / Self Care | Payer: Medicaid Other | Attending: Emergency Medicine | Admitting: Emergency Medicine

## 2015-07-16 DIAGNOSIS — Z331 Pregnant state, incidental: Secondary | ICD-10-CM | POA: Insufficient documentation

## 2015-07-16 DIAGNOSIS — F17210 Nicotine dependence, cigarettes, uncomplicated: Secondary | ICD-10-CM | POA: Diagnosis not present

## 2015-07-16 DIAGNOSIS — Z79899 Other long term (current) drug therapy: Secondary | ICD-10-CM | POA: Insufficient documentation

## 2015-07-16 DIAGNOSIS — R109 Unspecified abdominal pain: Secondary | ICD-10-CM

## 2015-07-16 DIAGNOSIS — R112 Nausea with vomiting, unspecified: Secondary | ICD-10-CM | POA: Diagnosis present

## 2015-07-16 DIAGNOSIS — R1032 Left lower quadrant pain: Secondary | ICD-10-CM | POA: Diagnosis not present

## 2015-07-16 DIAGNOSIS — Z3490 Encounter for supervision of normal pregnancy, unspecified, unspecified trimester: Secondary | ICD-10-CM

## 2015-07-16 DIAGNOSIS — F329 Major depressive disorder, single episode, unspecified: Secondary | ICD-10-CM | POA: Insufficient documentation

## 2015-07-16 LAB — CBC
HEMATOCRIT: 47.3 % — AB (ref 35.0–47.0)
Hemoglobin: 16.2 g/dL — ABNORMAL HIGH (ref 12.0–16.0)
MCH: 31.3 pg (ref 26.0–34.0)
MCHC: 34.2 g/dL (ref 32.0–36.0)
MCV: 91.5 fL (ref 80.0–100.0)
Platelets: 245 10*3/uL (ref 150–440)
RBC: 5.17 MIL/uL (ref 3.80–5.20)
RDW: 14.5 % (ref 11.5–14.5)
WBC: 12.8 10*3/uL — ABNORMAL HIGH (ref 3.6–11.0)

## 2015-07-16 LAB — COMPREHENSIVE METABOLIC PANEL
ALBUMIN: 4.7 g/dL (ref 3.5–5.0)
ALK PHOS: 48 U/L (ref 38–126)
ALT: 20 U/L (ref 14–54)
AST: 32 U/L (ref 15–41)
Anion gap: 12 (ref 5–15)
BILIRUBIN TOTAL: 0.9 mg/dL (ref 0.3–1.2)
BUN: 14 mg/dL (ref 6–20)
CALCIUM: 9.3 mg/dL (ref 8.9–10.3)
CO2: 24 mmol/L (ref 22–32)
Chloride: 97 mmol/L — ABNORMAL LOW (ref 101–111)
Creatinine, Ser: 0.7 mg/dL (ref 0.44–1.00)
GFR calc Af Amer: >60 mL/min (ref >60)
GFR calc non Af Amer: >60 mL/min (ref >60)
GLUCOSE: 93 mg/dL (ref 65–99)
Potassium: 4 mmol/L (ref 3.5–5.1)
Sodium: 133 mmol/L — ABNORMAL LOW (ref 135–145)
TOTAL PROTEIN: 8.4 g/dL — AB (ref 6.5–8.1)

## 2015-07-16 LAB — URINALYSIS COMPLETE WITH MICROSCOPIC (ARMC ONLY)
BACTERIA UA: NONE SEEN
Bilirubin Urine: NEGATIVE
GLUCOSE, UA: NEGATIVE mg/dL
Leukocytes, UA: NEGATIVE
Nitrite: NEGATIVE
Protein, ur: 30 mg/dL — AB
SPECIFIC GRAVITY, URINE: 1.023 (ref 1.005–1.030)
WBC, UA: NONE SEEN WBC/hpf (ref 0–5)
pH: 5 (ref 5.0–8.0)

## 2015-07-16 LAB — HCG, QUANTITATIVE, PREGNANCY: hCG, Beta Chain, Quant, S: 224100 m[IU]/mL — ABNORMAL HIGH (ref <5)

## 2015-07-16 LAB — PREGNANCY, URINE: PREG TEST UR: POSITIVE — AB

## 2015-07-16 LAB — LIPASE, BLOOD: Lipase: 12 U/L (ref 11–51)

## 2015-07-16 MED ORDER — SODIUM CHLORIDE 0.9 % IV BOLUS (SEPSIS)
1000.0000 mL | Freq: Once | INTRAVENOUS | Status: AC
  Administered 2015-07-16: 1000 mL via INTRAVENOUS

## 2015-07-16 MED ORDER — ONDANSETRON 4 MG PO TBDP
4.0000 mg | ORAL_TABLET | Freq: Once | ORAL | Status: AC | PRN
  Administered 2015-07-16: 4 mg via ORAL

## 2015-07-16 MED ORDER — ONDANSETRON 4 MG PO TBDP
ORAL_TABLET | ORAL | Status: AC
  Administered 2015-07-16: 4 mg via ORAL
  Filled 2015-07-16: qty 1

## 2015-07-16 MED ORDER — ONDANSETRON HCL 4 MG/2ML IJ SOLN
4.0000 mg | Freq: Once | INTRAMUSCULAR | Status: AC
  Administered 2015-07-16: 4 mg via INTRAVENOUS
  Filled 2015-07-16: qty 2

## 2015-07-16 MED ORDER — OXYCODONE-ACETAMINOPHEN 5-325 MG PO TABS
1.0000 | ORAL_TABLET | Freq: Once | ORAL | Status: AC
  Administered 2015-07-16: 1 via ORAL

## 2015-07-16 MED ORDER — KETOROLAC TROMETHAMINE 30 MG/ML IJ SOLN
30.0000 mg | Freq: Once | INTRAMUSCULAR | Status: AC
  Administered 2015-07-16: 30 mg via INTRAVENOUS
  Filled 2015-07-16: qty 1

## 2015-07-16 MED ORDER — OXYCODONE-ACETAMINOPHEN 5-325 MG PO TABS
ORAL_TABLET | ORAL | Status: AC
  Administered 2015-07-16: 1 via ORAL
  Filled 2015-07-16: qty 1

## 2015-07-16 MED ORDER — METOCLOPRAMIDE HCL 10 MG PO TABS: 10.0000 mg | ORAL_TABLET | Freq: Four times a day (QID) | ORAL | Status: DC | PRN

## 2015-07-16 NOTE — ED Notes (Signed)
[place] sleeping comfortably.

## 2015-07-16 NOTE — ED Notes (Signed)
Pt c/o dark urine and vomiting.  Has had LLQ pain and left flank pain.  Denies hematuria.  Reports can keep liquids down.  Last BM was 2 days ago. Denies dysuria.

## 2015-07-16 NOTE — ED Notes (Signed)
Pt describes left side tenderness (CVA tenderness)

## 2015-07-16 NOTE — ED Provider Notes (Signed)
[facility] Emergency Department Provider Note    ____________________________________________  Time seen: ~1530  I have reviewed the triage vital signs and the nursing notes.   HISTORY  Chief Complaint Emesis   History limited by: Not Limited   HPI Janice Walls is a 30 y.o. female who presents to the emergency department today because of concerns for left flank pain. She states started a day and half ago. Has progressively gotten worse. She describes it as sharp. It is severe. There is some radiation of the front of her stomach. She states that she has had some nausea associated with this. She has not had any measured fevers but has felt chills. She denies similar symptoms in the past. States there is family history of kidney stones.   History reviewed. No pertinent past medical history.  There are no active problems to display for this patient.   Past Surgical History  Procedure Laterality Date  . Cesarean section    . Cesarean section      Current Outpatient Rx  Name  Route  Sig  Dispense  Refill  . FLUoxetine (PROZAC) 40 MG capsule   Oral   Take 1 capsule by mouth daily.      0   . lithium carbonate (LITHOBID) 300 MG CR tablet   Oral   Take 1 tablet by mouth 2 (two) times daily.      1   . EXPIRED: omeprazole (PRILOSEC) 20 MG capsule   Oral   Take 1 capsule (20 mg total) by mouth daily.   30 capsule   0   . oxyCODONE-acetaminophen (ROXICET) 5-325 MG tablet   Oral   Take 1 tablet by mouth every 6 (six) hours as needed.   20 tablet   0   . traZODone (DESYREL) 50 MG tablet   Oral   Take 2 tablets by mouth at bedtime.      2     Allergies Aspirin and Morphine and related  History reviewed. No pertinent family history.  Social History Social History  Substance Use Topics  . Smoking status: Current Some Day Smoker -- 0.50 packs/day    Types: Cigarettes  . Smokeless tobacco: None  . Alcohol Use: No    Review of  Systems  Constitutional: Negative for fever. Cardiovascular: Negative for chest pain. Respiratory: Negative for shortness of breath. Gastrointestinal: Positive for left flank pain Neurological: Negative for headaches, focal weakness or numbness.  10-point ROS otherwise negative.  ____________________________________________   PHYSICAL EXAM:  VITAL SIGNS: ED Triage Vitals  Enc Vitals Group     BP 07/16/15 1242 107/67 mmHg     Pulse Rate 07/16/15 1242 72     Resp 07/16/15 1242 17     Temp 07/16/15 1242 97.5 F (36.4 C)     Temp Source 07/16/15 1242 Oral     SpO2 07/16/15 1242 100 %     Weight 07/16/15 1242 135 lb (61.236 kg)     Height 07/16/15 1242  (1.575 m)     Head Cir --      Peak Flow --      Pain Score 07/16/15 1243 10     Pain Loc --    Constitutional: Alert and oriented. Well appearing and in no distress. Lying comfortably on the stretcher Eyes: Conjunctivae are normal. PERRL. Normal extraocular movements. ENT   Head: Normocephalic and atraumatic.   Nose: No congestion/rhinnorhea.   Mouth/Throat: Mucous membranes are moist.   Neck: No stridor.  Hematological/Lymphatic/Immunilogical: No cervical lymphadenopathy. Cardiovascular: Normal rate, regular rhythm.  No murmurs, rubs, or gallops. Respiratory: Normal respiratory effort without tachypnea nor retractions. Breath sounds are clear and equal bilaterally. No wheezes/rales/rhonchi. Gastrointestinal: Soft and nontender. No distention. There is no CVA tenderness. Genitourinary: Deferred Musculoskeletal: Normal range of motion in all extremities. No joint effusions.  No lower extremity tenderness nor edema. Neurologic:  Normal speech and language. No gross focal neurologic deficits are appreciated.  Skin:  Skin is warm, dry and intact. No rash noted. Psychiatric: Mood and affect are normal. Speech and behavior are normal. Patient exhibits appropriate insight and  judgment.  ____________________________________________    LABS (pertinent positives/negatives)  Labs Reviewed  COMPREHENSIVE METABOLIC PANEL - Abnormal; Notable for the following:    Sodium 133 (*)    Chloride 97 (*)    Total Protein 8.4 (*)    All other components within normal limits  CBC - Abnormal; Notable for the following:    WBC 12.8 (*)    Hemoglobin 16.2 (*)    HCT 47.3 (*)    All other components within normal limits  URINALYSIS COMPLETEWITH MICROSCOPIC ([facility] ONLY) - Abnormal; Notable for the following:    Color, Urine YELLOW (*)    APPearance TURBID (*)    Ketones, ur 2+ (*)    Hgb urine dipstick 2+ (*)    Protein, ur 30 (*)    Squamous Epithelial / LPF 6-30 (*)    All other components within normal limits  PREGNANCY, URINE - Abnormal; Notable for the following:    Preg Test, Ur POSITIVE (*)    All other components within normal limits  LIPASE, BLOOD  HCG, QUANTITATIVE, PREGNANCY  POC URINE PREG, ED     ____________________________________________   EKG  None  ____________________________________________    RADIOLOGY  US IMPRESSION: 1. Single live intrauterine gestation with an estimated gestational age of [redacted] weeks and 6 days. 2. Small subchorionic hemorrhage. 3. No evidence of ovarian torsion.  ____________________________________________   PROCEDURES  Procedure(s) performed: None  Critical Care performed: No  ____________________________________________   INITIAL IMPRESSION / ASSESSMENT AND PLAN / ED COURSE  Pertinent labs & imaging results that were available during my care of the patient were reviewed by me and considered in my medical decision making (see chart for details).  Patient presented to the emergency department today because of left flank pain. Blood work did have some red blood cells initial concern was for kidney stone. However urine pregnancy did return positive. An ultrasound was performed which showed a single live  intrauterine gestation roughly 7 weeks and 6 days. I did discuss this finding with the patient. Discussed with patient importance of starting prenatal vitamins and no smoking or alcohol exposure. Encourage patient to establish care with OB/GYN doctor.  ____________________________________________   FINAL CLINICAL IMPRESSION(S) / ED DIAGNOSES  Final diagnoses:  Left flank pain  Pregnant     Phineas SemenGraydon Darreld Hoffer, MD 07/16/15 1810

## 2015-07-16 NOTE — ED Notes (Signed)
Pt seen walking through lobby by this RN with IV in arm and fully dressed. Stopped pt and asked where she was going.  She states "outside".  Informed patient cannot go outside while a patient and especially with an IV in her arm.  Patient states "take it out then and i'll go out then come back in".  Explained she cannot go out as a patient and will have to start check in process all over again.  Pt starts stating "i am suicidal anyway so it doesn't matter, it is all in my paperwork".  [employer] PD and ODS in lobby with RN. Patient upset because RN will not let her go outside as patient and with IV.  Charge RN called and is coming to lobby to address pt.

## 2015-07-16 NOTE — ED Notes (Signed)
Primary rn notified pt c/o nausea.

## 2015-07-16 NOTE — Discharge Instructions (Signed)
Please seek medical attention for any high fevers, chest pain, shortness of breath, change in behavior, persistent vomiting, bloody stool or any other new or concerning symptoms. ° ° °Prenatal Care °WHAT IS PRENATAL CARE?  °Prenatal care is the process of caring for a pregnant woman before she gives birth. Prenatal care makes sure that she and her baby remain as healthy as possible throughout pregnancy. Prenatal care may be provided by a midwife, family practice health care provider, or a childbirth and pregnancy specialist (obstetrician). Prenatal care may include physical examinations, testing, treatments, and education on nutrition, lifestyle, and social support services. °WHY IS PRENATAL CARE SO IMPORTANT?  °Early and consistent prenatal care increases the chance that you and your baby will remain healthy throughout your pregnancy. This type of care also decreases a baby's risk of being born too early (prematurely), or being born smaller than expected (small for gestational age). Any underlying medical conditions you may have that could pose a risk during your pregnancy are discussed during prenatal care visits. You will also be monitored regularly for any new conditions that may arise during your pregnancy so they can be treated quickly and effectively. °WHAT HAPPENS DURING PRENATAL CARE VISITS? °Prenatal care visits may include the following: °Discussion °Tell your health care provider about any new signs or symptoms you have experienced since your last visit. These might include: °· Nausea or vomiting. °· Increased or decreased level of energy. °· Difficulty sleeping. °· Back or leg pain. °· Weight changes. °· Frequent urination. °· Shortness of breath with physical activity. °· Changes in your skin, such as the development of a rash or itchiness. °· Vaginal discharge or bleeding. °· Feelings of excitement or nervousness. °· Changes in your baby's movements. °You may want to write down any questions or topics  you want to discuss with your health care provider and bring them with you to your appointment. °Examination °During your first prenatal care visit, you will likely have a complete physical exam. Your health care provider will often examine your vagina, cervix, and the position of your uterus, as well as check your heart, lungs, and other body systems. As your pregnancy progresses, your health care provider will measure the size of your uterus and your baby's position inside your uterus. He or she may also examine you for early signs of labor. Your prenatal visits may also include checking your blood pressure and, after about 10-12 weeks of pregnancy, listening to your baby's heartbeat. °Testing °Regular testing often includes: °· Urinalysis. This checks your urine for glucose, protein, or signs of infection. °· Blood count. This checks the levels of white and red blood cells in your body. °· Tests for sexually transmitted infections (STIs). Testing for STIs at the beginning of pregnancy is routinely done and is required in many states. °· Antibody testing. You will be checked to see if you are immune to certain illnesses, such as rubella, that can affect a developing fetus. °· Glucose screen. Around 24-28 weeks of pregnancy, your blood glucose level will be checked for signs of gestational diabetes. Follow-up tests may be recommended. °· Group B strep. This is a bacteria that is commonly found inside a woman's vagina. This test will inform your health care provider if you need an antibiotic to reduce the amount of this bacteria in your body prior to labor and childbirth. °· Ultrasound. Many pregnant women undergo an ultrasound screening around 18-20 weeks of pregnancy to evaluate the health of the fetus and check for   any developmental abnormalities.  HIV (human immunodeficiency virus) testing. Early in your pregnancy, you will be screened for HIV. If you are at high risk for HIV, this test may be repeated during  your third trimester of pregnancy. You may be offered other testing based on your age, personal or family medical history, or other factors.  HOW OFTEN SHOULD I PLAN TO SEE MY HEALTH CARE PROVIDER FOR PRENATAL CARE? Your prenatal care check-up schedule depends on any medical conditions you have before, or develop during, your pregnancy. If you do not have any underlying medical conditions, you will likely be seen for checkups:  Monthly, during the first 6 months of pregnancy.  Twice a month during months 7 and 8 of pregnancy.  Weekly starting in the 9th month of pregnancy and until delivery. If you develop signs of early labor or other concerning signs or symptoms, you may need to see your health care provider more often. Ask your health care provider what prenatal care schedule is best for you. WHAT CAN I DO TO KEEP MYSELF AND MY BABY AS HEALTHY AS POSSIBLE DURING MY PREGNANCY?  Take a prenatal vitamin containing 400 micrograms (0.4 mg) of folic acid every day. Your health care provider may also ask you to take additional vitamins such as iodine, vitamin D, iron, copper, and zinc.  Take 1500-2000 mg of calcium daily starting at your 20th week of pregnancy until you deliver your baby.  Make sure you are up to date on your vaccinations. Unless directed otherwise by your health care provider:  You should receive a tetanus, diphtheria, and pertussis (Tdap) vaccination between the 27th and 36th week of your pregnancy, regardless of when your last Tdap immunization occurred. This helps protect your baby from whooping cough (pertussis) after he or she is born.  You should receive an annual inactivated influenza vaccine (IIV) to help protect you and your baby from influenza. This can be done at any point during your pregnancy.  Eat a well-rounded diet that includes:  Fresh fruits and vegetables.  Lean proteins.  Calcium-rich foods such as milk, yogurt, hard cheeses, and dark, leafy  greens.  Whole grain breads.  Do noteat seafood high in mercury, including:  Swordfish.  Tilefish.  Shark.  King mackerel.  More than 6 oz tuna per week.  Do not eat:  Raw or undercooked meats or eggs.  Unpasteurized foods, such as soft cheeses (brie, blue, or feta), juices, and milks.  Lunch meats.  Hot dogs that have not been heated until they are steaming.  Drink enough water to keep your urine clear or pale yellow. For many women, this may be 10 or more 8 oz glasses of water each day. Keeping yourself hydrated helps deliver nutrients to your baby and may prevent the start of pre-term uterine contractions.  Do not use any tobacco products including cigarettes, chewing tobacco, or electronic cigarettes. If you need help quitting, ask your health care provider.  Do not drink beverages containing alcohol. No safe level of alcohol consumption during pregnancy has been determined.  Do not use any illegal drugs. These can harm your developing baby or cause a miscarriage.  Ask your health care provider or pharmacist before taking any prescription or over-the-counter medicines, herbs, or supplements.  Limit your caffeine intake to no more than 200 mg per day.  Exercise. Unless told otherwise by your health care provider, try to get 30 minutes of moderate exercise most days of the week. Do not  do high-impact  activities, contact sports, or activities with a high risk of falling, such as horseback riding or downhill skiing.  Get plenty of rest.  Avoid anything that raises your body temperature, such as hot tubs and saunas.  If you own a cat, do not empty its litter box. Bacteria contained in cat feces can cause an infection called toxoplasmosis. This can result in serious harm to the fetus.  Stay away from chemicals such as insecticides, lead, mercury, and cleaning or paint products that contain solvents.  Do not have any X-rays taken unless medically necessary.  Take a  childbirth and breastfeeding preparation class. Ask your health care provider if you need a referral or recommendation.   This information is not intended to replace advice given to you by your health care provider. Make sure you discuss any questions you have with your health care provider.   Document Released: 04/20/2003 Document Revised: 05/08/2014 Document Reviewed: 07/02/2013 Elsevier Interactive Patient Education [employer].

## 2015-07-18 ENCOUNTER — Emergency Department
Admission: EM | Admit: 2015-07-18 | Discharge: 2015-07-18 | Disposition: A | Discharge status: Home / Self Care | Payer: Medicaid Other | Attending: Emergency Medicine | Admitting: Emergency Medicine

## 2015-07-18 ENCOUNTER — Emergency Department: Payer: Medicaid Other

## 2015-07-18 ENCOUNTER — Encounter: Payer: Self-pay | Admitting: *Deleted

## 2015-07-18 DIAGNOSIS — Z888 Allergy status to other drugs, medicaments and biological substances status: Secondary | ICD-10-CM | POA: Insufficient documentation

## 2015-07-18 DIAGNOSIS — Z885 Allergy status to narcotic agent status: Secondary | ICD-10-CM | POA: Diagnosis not present

## 2015-07-18 DIAGNOSIS — Z79899 Other long term (current) drug therapy: Secondary | ICD-10-CM | POA: Insufficient documentation

## 2015-07-18 DIAGNOSIS — F329 Major depressive disorder, single episode, unspecified: Secondary | ICD-10-CM | POA: Diagnosis not present

## 2015-07-18 DIAGNOSIS — O210 Mild hyperemesis gravidarum: Secondary | ICD-10-CM | POA: Insufficient documentation

## 2015-07-18 DIAGNOSIS — O26891 Other specified pregnancy related conditions, first trimester: Secondary | ICD-10-CM | POA: Diagnosis not present

## 2015-07-18 DIAGNOSIS — F17210 Nicotine dependence, cigarettes, uncomplicated: Secondary | ICD-10-CM | POA: Diagnosis not present

## 2015-07-18 DIAGNOSIS — Z3A00 Weeks of gestation of pregnancy not specified: Secondary | ICD-10-CM | POA: Insufficient documentation

## 2015-07-18 DIAGNOSIS — R1032 Left lower quadrant pain: Secondary | ICD-10-CM | POA: Diagnosis present

## 2015-07-18 HISTORY — DX: Insomnia, unspecified: G47.00

## 2015-07-18 HISTORY — DX: Major depressive disorder, single episode, unspecified: F32.9

## 2015-07-18 HISTORY — DX: Depression, unspecified: F32.A

## 2015-07-18 HISTORY — DX: Anxiety disorder, unspecified: F41.9

## 2015-07-18 LAB — URINALYSIS COMPLETE WITH MICROSCOPIC (ARMC ONLY)
Bacteria, UA: NONE SEEN
GLUCOSE, UA: NEGATIVE mg/dL
Hgb urine dipstick: NEGATIVE
Leukocytes, UA: NEGATIVE
NITRITE: NEGATIVE
PROTEIN: 100 mg/dL — AB
SPECIFIC GRAVITY, URINE: 1.027 (ref 1.005–1.030)
pH: 6 (ref 5.0–8.0)

## 2015-07-18 LAB — CBC WITH DIFFERENTIAL/PLATELET
BASOS PCT: 0 %
Basophils Absolute: 0 10*3/uL (ref 0–0.1)
EOS ABS: 0.1 10*3/uL (ref 0–0.7)
EOS PCT: 1 %
HCT: 43.9 % (ref 35.0–47.0)
HEMOGLOBIN: 15.1 g/dL (ref 12.0–16.0)
LYMPHS ABS: 2.1 10*3/uL (ref 1.0–3.6)
Lymphocytes Relative: 15 %
MCH: 31.2 pg (ref 26.0–34.0)
MCHC: 34.5 g/dL (ref 32.0–36.0)
MCV: 90.3 fL (ref 80.0–100.0)
MONOS PCT: 10 %
Monocytes Absolute: 1.3 10*3/uL — ABNORMAL HIGH (ref 0.2–0.9)
NEUTROS PCT: 74 %
Neutro Abs: 10.4 10*3/uL — ABNORMAL HIGH (ref 1.4–6.5)
PLATELETS: 216 10*3/uL (ref 150–440)
RBC: 4.86 MIL/uL (ref 3.80–5.20)
RDW: 14.2 % (ref 11.5–14.5)
WBC: 13.9 10*3/uL — ABNORMAL HIGH (ref 3.6–11.0)

## 2015-07-18 LAB — BASIC METABOLIC PANEL
ANION GAP: 7 (ref 5–15)
BUN: 9 mg/dL (ref 6–20)
CHLORIDE: 94 mmol/L — AB (ref 101–111)
CO2: 31 mmol/L (ref 22–32)
Calcium: 9 mg/dL (ref 8.9–10.3)
Creatinine, Ser: 0.52 mg/dL (ref 0.44–1.00)
GFR calc Af Amer: >60 mL/min (ref >60)
GLUCOSE: 86 mg/dL (ref 65–99)
POTASSIUM: 2.4 mmol/L — AB (ref 3.5–5.1)
Sodium: 132 mmol/L — ABNORMAL LOW (ref 135–145)

## 2015-07-18 MED ORDER — POTASSIUM CHLORIDE ER 20 MEQ PO TBCR: 20.0000 meq | EXTENDED_RELEASE_TABLET | Freq: Every day | ORAL | Status: DC

## 2015-07-18 MED ORDER — PROMETHAZINE HCL 12.5 MG PO TABS: 12.5000 mg | ORAL_TABLET | Freq: Four times a day (QID) | ORAL | Status: DC | PRN

## 2015-07-18 MED ORDER — HYDROMORPHONE HCL 1 MG/ML IJ SOLN
0.5000 mg | Freq: Once | INTRAMUSCULAR | Status: AC
  Administered 2015-07-18: 0.5 mg via INTRAVENOUS
  Filled 2015-07-18: qty 1

## 2015-07-18 MED ORDER — PROMETHAZINE HCL 12.5 MG RE SUPP: 12.5000 mg | Freq: Four times a day (QID) | RECTAL | Status: DC | PRN

## 2015-07-18 MED ORDER — POTASSIUM CHLORIDE CRYS ER 20 MEQ PO TBCR
40.0000 meq | EXTENDED_RELEASE_TABLET | Freq: Once | ORAL | Status: AC
  Administered 2015-07-18: 40 meq via ORAL
  Filled 2015-07-18: qty 2

## 2015-07-18 MED ORDER — PROMETHAZINE HCL 25 MG/ML IJ SOLN
12.5000 mg | Freq: Once | INTRAMUSCULAR | Status: AC
  Administered 2015-07-18: 12.5 mg via INTRAVENOUS
  Filled 2015-07-18: qty 1

## 2015-07-18 MED ORDER — SODIUM CHLORIDE 0.9 % IV BOLUS (SEPSIS)
1000.0000 mL | Freq: Once | INTRAVENOUS | Status: AC
  Administered 2015-07-18: 1000 mL via INTRAVENOUS

## 2015-07-18 NOTE — ED Notes (Signed)
Pt given ginger ale per MD 

## 2015-07-18 NOTE — ED Provider Notes (Signed)
Time Seen: Approximately 1650  I have reviewed the triage notes  Chief Complaint: Abdominal Pain   History of Present Illness: Janice DurandJennifer Walls is a 30 y.o. female *who was recently evaluated here for a first trimester pregnancy with some left lower quadrant abdominal pain. Patient had an ultrasound which did not show any evidence of a ectopic pregnancy and the patient's otherwise discharged in stable condition. Patient states that she is still having persistent left lower quadrant abdominal pain. She denies any fever or vaginal bleeding. She states that she's had multiple elective abortions and plans on aborting this pregnancy. She states her main concerns persistent nausea and vomiting. She denies any loose stool or diarrhea.   Past Medical History  Diagnosis Date  . Depression   . Anxiety   . Insomnia     There are no active problems to display for this patient.   Past Surgical History  Procedure Laterality Date  . Cesarean section    . Cesarean section      Past Surgical History  Procedure Laterality Date  . Cesarean section    . Cesarean section      Current Outpatient Rx  Name  Route  Sig  Dispense  Refill  . cetirizine (ZYRTEC) 10 MG tablet   Oral   Take 10 mg by mouth daily as needed.      0   . FLUoxetine (PROZAC) 40 MG capsule   Oral   Take 1 capsule by mouth daily.      0   . haloperidol decanoate (HALDOL DECANOATE) 50 MG/ML injection      1 mL every 30 (thirty) days.      0   . hydrOXYzine (VISTARIL) 50 MG capsule   Oral   Take 1 capsule by mouth every 6 (six) hours as needed.      0   . lithium carbonate (LITHOBID) 300 MG CR tablet   Oral   Take 1 tablet by mouth 2 (two) times daily.      1   . EXPIRED: omeprazole (PRILOSEC) 20 MG capsule   Oral   Take 1 capsule (20 mg total) by mouth daily. Patient not taking: Reported on 07/16/2015   30 capsule   0   . oxyCODONE-acetaminophen (ROXICET) 5-325 MG tablet   Oral   Take 1 tablet by  mouth every 6 (six) hours as needed. Patient not taking: Reported on 07/16/2015   20 tablet   0   . potassium chloride 20 MEQ TBCR   Oral   Take 20 mEq by mouth daily.   7 tablet   0   . promethazine (PHENERGAN) 12.5 MG suppository   Rectal   Place 1 suppository (12.5 mg total) rectally every 6 (six) hours as needed for nausea or vomiting.   12 each   0   . promethazine (PHENERGAN) 12.5 MG tablet   Oral   Take 1 tablet (12.5 mg total) by mouth every 6 (six) hours as needed for nausea or vomiting.   30 tablet   0   . traZODone (DESYREL) 50 MG tablet   Oral   Take 2 tablets by mouth at bedtime.      2     Allergies:  Aspirin and Morphine and related  Family History: No family history on file.  Social History: Social History  Substance Use Topics  . Smoking status: Current Some Day Smoker -- 0.50 packs/day    Types: Cigarettes  . Smokeless tobacco: None  .  Alcohol Use: No     Review of Systems:   10 point review of systems was performed and was otherwise negative:  Constitutional: No fever Eyes: No visual disturbances ENT: No sore throat, ear pain Cardiac: No chest pain Respiratory: No shortness of breath, wheezing, or stridor Abdomen: Pain is mostly in the left lower quadrant and radiates to the left flank area. She denies any diarrhea Endocrine: No weight loss, No night sweats Extremities: No peripheral edema, cyanosis Skin: No rashes, easy bruising Neurologic: No focal weakness, trouble with speech or swollowing Urologic: No dysuria, Hematuria, or urinary frequency Patient denies any vaginal discharge or bleeding  Physical Exam:  ED Triage Vitals  Enc Vitals Group     BP 07/18/15 1605 116/79 mmHg     Pulse Rate 07/18/15 1605 83     Resp 07/18/15 1605 18     Temp 07/18/15 1605 98.3 F (36.8 C)     Temp Source 07/18/15 1605 Oral     SpO2 07/18/15 1605 100 %     Weight 07/18/15 1605 130 lb (58.968 kg)     Height 07/18/15 1605  (1.575 m)      Head Cir --      Peak Flow --      Pain Score 07/18/15 1606 10     Pain Loc --      Pain Edu? --      Excl. in GC? --     General: Awake , Alert , and Oriented times 3; GCS 15. Anxious and dramatic historian Head: Normal cephalic , atraumatic Eyes: Pupils equal , round, reactive to light Nose/Throat: No nasal drainage, patent upper airway without erythema or exudate.  Neck: Supple, Full range of motion, No anterior adenopathy or palpable thyroid masses Lungs: Clear to ascultation without wheezes , rhonchi, or rales Heart: Regular rate, regular rhythm without murmurs , gallops , or rubs Abdomen: Patient's tender in the left lower quadrant without rebound, guarding , or rigidity; bowel sounds positive and symmetric in all 4 quadrants. No organomegaly .   No obvious reproducible left flank pain     Extremities: 2 plus symmetric pulses. No edema, clubbing or cyanosis Neurologic: normal ambulation, Motor symmetric without deficits, sensory intact Skin: warm, dry, no rashes   Labs:   All laboratory work was reviewed including any pertinent negatives or positives listed below:  Labs Reviewed  CBC WITH DIFFERENTIAL/PLATELET - Abnormal; Notable for the following:    WBC 13.9 (*)    Neutro Abs 10.4 (*)    Monocytes Absolute 1.3 (*)    All other components within normal limits  URINALYSIS COMPLETEWITH MICROSCOPIC ([facility] ONLY) - Abnormal; Notable for the following:    Color, Urine AMBER (*)    APPearance HAZY (*)    Bilirubin Urine 1+ (*)    Ketones, ur 1+ (*)    Protein, ur 100 (*)    Squamous Epithelial / LPF 6-30 (*)    All other components within normal limits  BASIC METABOLIC PANEL - Abnormal; Notable for the following:    Sodium 132 (*)    Potassium 2.4 (*)    Chloride 94 (*)    All other components within normal limits   patient does not appear to have a urinary tract infection notes fairly concentrated her potassium is low at 2.4.    Radiology:   CLINICAL DATA: Left  flank pain for 3-4 days. First-trimester pregnancy.  EXAM: RENAL / URINARY TRACT ULTRASOUND COMPLETE  COMPARISON: 01/26/2015 abdominal CT  FINDINGS: Right Kidney:  Length: 11 cm. Echogenicity within normal limits. No mass or hydronephrosis visualized.  Left Kidney:  Length: 11 cm. Echogenicity within normal limits. No mass or hydronephrosis visualized.  Bladder:  Appears normal for degree of bladder distention.  IMPRESSION: Normal renal ultrasound.   Electronically Signed By: Marnee Spring M.D. On: 07/18/2015 19:57   I personally reviewed the radiologic studies     ED Course:  Patient's stay here showed symptomatic improvement. She was given IV fluid bolus and IV Phenergan with symptomatic relief of her nausea and vomiting. Assessment of her left lower quadrant abdominal pain shows a normal ultrasound and I felt this was unlikely to be pyelonephritis or renal colic. Patient may have a cyst secondary to her pregnancy but does not have any evidence of ectopic pregnancy given her previous ultrasound.  ED course and review of laboratory work and ultrasound results were related to significant other since patient was still drowsy from Phenergan. She will also be given a prescription for potassium on an outpatient basis.   assessment:  Hyperemesis gravidarum Hypokalemia   Final diagnoses:  Hyperemesis gravidarum     Plan:  Outpatient management  Patient was advised to return immediately if condition worsens. Patient was advised to follow up with their primary care physician or other specialized physicians involved in their outpatient care. The patient and/or family member/power of attorney had laboratory results reviewed at the bedside. All questions and concerns were addressed and appropriate discharge instructions were distributed by the nursing staff.             Jennye Moccasin, MD 07/18/15 [phone]

## 2015-07-18 NOTE — ED Notes (Signed)
MD Quigley at bedside. 

## 2015-07-18 NOTE — Discharge Instructions (Signed)
Hyperemesis Gravidarum °Hyperemesis gravidarum is a severe form of nausea and vomiting that happens during pregnancy. Hyperemesis is worse than morning sickness. It may cause you to have nausea or vomiting all day for many days. It may keep you from eating and drinking enough food and liquids. Hyperemesis usually occurs during the first half (the first 20 weeks) of pregnancy. It often goes away once a woman is in her second half of pregnancy. However, sometimes hyperemesis continues through an entire pregnancy.  °CAUSES  °The cause of this condition is not completely known but is thought to be related to changes in the body's hormones when pregnant. It could be from the high level of the pregnancy hormone or an increase in estrogen in the body.  °SIGNS AND SYMPTOMS  °· Severe nausea and vomiting. °· Nausea that does not go away. °· Vomiting that does not allow you to keep any food down. °· Weight loss and body fluid loss (dehydration). °· Having no desire to eat or not liking food you have previously enjoyed. °DIAGNOSIS  °Your health care provider will do a physical exam and ask you about your symptoms. He or she may also order blood tests and urine tests to make sure something else is not causing the problem.  °TREATMENT  °You may only need medicine to control the problem. If medicines do not control the nausea and vomiting, you will be treated in the hospital to prevent dehydration, increased acid in the blood (acidosis), weight loss, and changes in the electrolytes in your body that may harm the unborn baby (fetus). You may need IV fluids.  °HOME CARE INSTRUCTIONS  °· Only take over-the-counter or prescription medicines as directed by your health care provider. °· Try eating a couple of dry crackers or toast in the morning before getting out of bed. °· Avoid foods and smells that upset your stomach. °· Avoid fatty and spicy foods. °· Eat 5-6 small meals a day. °· Do not drink when eating meals. Drink between  meals. °· For snacks, eat high-protein foods, such as cheese. °· Eat or suck on things that have ginger in them. Ginger helps nausea. °· Avoid food preparation. The smell of food can spoil your appetite. °· Avoid iron pills and iron in your multivitamins until after 3-4 months of being pregnant. However, consult with your health care provider before stopping any prescribed iron pills. °SEEK MEDICAL CARE IF:  °· Your abdominal pain increases. °· You have a severe headache. °· You have vision problems. °· You are losing weight. °SEEK IMMEDIATE MEDICAL CARE IF:  °· You are unable to keep fluids down. °· You vomit blood. °· You have constant nausea and vomiting. °· You have excessive weakness. °· You have extreme thirst. °· You have dizziness or fainting. °· You have a fever or persistent symptoms for more than 2-3 days. °· You have a fever and your symptoms suddenly get worse. °MAKE SURE YOU:  °· Understand these instructions. °· Will watch your condition. °· Will get help right away if you are not doing well or get worse. °  °This information is not intended to replace advice given to you by your health care provider. Make sure you discuss any questions you have with your health care provider. °  °Document Released: 04/17/2005 Document Revised: 02/05/2013 Document Reviewed: 11/27/2012 °Elsevier Interactive Patient Education ©2016 [employer] ° °Please return immediately if condition worsens. Please contact her primary physician or the physician you were given for referral. If you   have any specialist physicians involved in her treatment and plan please also contact them. Thank you for using [facility]. ° °

## 2015-07-18 NOTE — ED Notes (Signed)
Patient states she was seen here two days ago for same LLQ abdominal pain and vomiting. Patient states the symptoms have not improved and that she cannot tolerate PO intake.

## 2015-12-06 ENCOUNTER — Emergency Department
Admission: EM | Admit: 2015-12-06 | Discharge: 2015-12-08 | Disposition: A | Discharge status: Home / Self Care | Payer: Medicaid Other | Attending: Emergency Medicine | Admitting: Emergency Medicine

## 2015-12-06 ENCOUNTER — Encounter: Payer: Self-pay | Admitting: Emergency Medicine

## 2015-12-06 DIAGNOSIS — Z79899 Other long term (current) drug therapy: Secondary | ICD-10-CM | POA: Insufficient documentation

## 2015-12-06 DIAGNOSIS — F3113 Bipolar disorder, current episode manic without psychotic features, severe: Secondary | ICD-10-CM | POA: Diagnosis not present

## 2015-12-06 DIAGNOSIS — F17210 Nicotine dependence, cigarettes, uncomplicated: Secondary | ICD-10-CM | POA: Insufficient documentation

## 2015-12-06 DIAGNOSIS — F99 Mental disorder, not otherwise specified: Secondary | ICD-10-CM | POA: Diagnosis present

## 2015-12-06 DIAGNOSIS — F3160 Bipolar disorder, current episode mixed, unspecified: Secondary | ICD-10-CM | POA: Diagnosis not present

## 2015-12-06 DIAGNOSIS — F1910 Other psychoactive substance abuse, uncomplicated: Secondary | ICD-10-CM | POA: Insufficient documentation

## 2015-12-06 DIAGNOSIS — Z046 Encounter for general psychiatric examination, requested by authority: Secondary | ICD-10-CM

## 2015-12-06 HISTORY — DX: Bipolar disorder, unspecified: F31.9

## 2015-12-06 LAB — CBC
HEMATOCRIT: 41.8 % (ref 35.0–47.0)
HEMOGLOBIN: 14.2 g/dL (ref 12.0–16.0)
MCH: 31.1 pg (ref 26.0–34.0)
MCHC: 34 g/dL (ref 32.0–36.0)
MCV: 91.3 fL (ref 80.0–100.0)
Platelets: 216 10*3/uL (ref 150–440)
RBC: 4.58 MIL/uL (ref 3.80–5.20)
RDW: 14.2 % (ref 11.5–14.5)
WBC: 15.8 10*3/uL — ABNORMAL HIGH (ref 3.6–11.0)

## 2015-12-06 LAB — COMPREHENSIVE METABOLIC PANEL
ALK PHOS: 84 U/L (ref 38–126)
ALT: 40 U/L (ref 14–54)
AST: 42 U/L — AB (ref 15–41)
Albumin: 4.3 g/dL (ref 3.5–5.0)
Anion gap: 14 (ref 5–15)
BUN: 21 mg/dL — AB (ref 6–20)
CALCIUM: 9.1 mg/dL (ref 8.9–10.3)
CO2: 17 mmol/L — AB (ref 22–32)
CREATININE: 0.91 mg/dL (ref 0.44–1.00)
Chloride: 106 mmol/L (ref 101–111)
GFR calc Af Amer: >60 mL/min (ref >60)
GFR calc non Af Amer: >60 mL/min (ref >60)
GLUCOSE: 67 mg/dL (ref 65–99)
Potassium: 3.6 mmol/L (ref 3.5–5.1)
SODIUM: 137 mmol/L (ref 135–145)
Total Bilirubin: 1.4 mg/dL — ABNORMAL HIGH (ref 0.3–1.2)
Total Protein: 7.7 g/dL (ref 6.5–8.1)

## 2015-12-06 LAB — URINE DRUG SCREEN, QUALITATIVE (ARMC ONLY)
Amphetamines, Ur Screen: POSITIVE — AB
BARBITURATES, UR SCREEN: NOT DETECTED
Benzodiazepine, Ur Scrn: POSITIVE — AB
COCAINE METABOLITE, UR ~~LOC~~: POSITIVE — AB
Cannabinoid 50 Ng, Ur ~~LOC~~: POSITIVE — AB
MDMA (Ecstasy)Ur Screen: NOT DETECTED
METHADONE SCREEN, URINE: NOT DETECTED
OPIATE, UR SCREEN: NOT DETECTED
PHENCYCLIDINE (PCP) UR S: NOT DETECTED
Tricyclic, Ur Screen: NOT DETECTED

## 2015-12-06 LAB — RAPID HIV SCREEN (HIV 1/2 AB+AG)
HIV 1/2 ANTIBODIES: NONREACTIVE
HIV-1 P24 Antigen - HIV24: NONREACTIVE

## 2015-12-06 LAB — ACETAMINOPHEN LEVEL

## 2015-12-06 LAB — POCT PREGNANCY, URINE: Preg Test, Ur: NEGATIVE

## 2015-12-06 LAB — SALICYLATE LEVEL: Salicylate Lvl: <4 mg/dL (ref 2.8–30.0)

## 2015-12-06 LAB — ETHANOL: Alcohol, Ethyl (B): <5 mg/dL (ref <5)

## 2015-12-06 MED ORDER — HALOPERIDOL 5 MG PO TABS
5.0000 mg | ORAL_TABLET | ORAL | Status: DC
  Filled 2015-12-06: qty 1

## 2015-12-06 MED ORDER — LORAZEPAM 2 MG PO TABS
2.0000 mg | ORAL_TABLET | ORAL | Status: AC
  Administered 2015-12-06: 2 mg via ORAL
  Filled 2015-12-06: qty 1

## 2015-12-06 MED ORDER — HALOPERIDOL 5 MG PO TABS
5.0000 mg | ORAL_TABLET | ORAL | Status: AC
  Administered 2015-12-06: 5 mg via ORAL
  Filled 2015-12-06: qty 1

## 2015-12-06 MED ORDER — LORAZEPAM 2 MG PO TABS
2.0000 mg | ORAL_TABLET | ORAL | Status: DC
  Filled 2015-12-06: qty 1

## 2015-12-06 MED ORDER — LORAZEPAM 2 MG/ML IJ SOLN
4.0000 mg | Freq: Once | INTRAMUSCULAR | Status: AC
  Administered 2015-12-06: 4 mg via INTRAMUSCULAR
  Filled 2015-12-06: qty 2

## 2015-12-06 MED ORDER — IBUPROFEN 800 MG PO TABS
800.0000 mg | ORAL_TABLET | Freq: Once | ORAL | Status: AC
  Administered 2015-12-06: 800 mg via ORAL
  Filled 2015-12-06: qty 1

## 2015-12-06 NOTE — ED Notes (Signed)
Janice Walls, EDT went in to strip room of monitor cords. Pt not expressing any SI at this time, but told this RN, "I OD'd while I was at [employer], ya'll better watch out."

## 2015-12-06 NOTE — ED Notes (Signed)
This RN went in to give ordered IM Ativan. Pt stated, "Watch, I won't even feel this shit." Ordered IM Ativan given. After giving it, pt tearful yelling, "Oh my fucking god that hurt! Why didn't you just give me the pill?!" This RN explained to pt that the MD would not give her more than  of ativan as a PO med. Pt screamed, "Can't you see I'm in so much pain already?!" referencing bruising on various part of her body, "What are you going to do about it?!" This RN explained that I could get her an ice pack for where the IM injection was and ask the MD for some IBU or tylenol. Pt then yelled "That shit won't work! Get out!"

## 2015-12-06 NOTE — ED Notes (Signed)
Asked pt regarding preference from blood draw; pt denies desire; Attempted to draw blood from pt in right hand; pt tensing hand and begans yelling stating "if you had just asked me to start with I could have told you where to do it at"; needle removed at pt's request; pt refuses to state her preference of draw is; wanting to go outside and smoke; instructed that she is not allowed to smoke at this time; pt calls this nurse "bitch"; attempting to locate vein to left arm/hand; pt continues to tense hand and jerk; will not continue to attempt blood draw at this time; pt facilates between yelling at this nurse and asking nurse for help and apologizing
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
## 2015-12-06 NOTE — ED Notes (Signed)
This RN took ordered PO meds in to pt. Explained to pt what meds MD had ordered. Pt stated, "Well I need at least 3 mg of Ativan otherwise it's not going to work. And Haldol gives me seizures, [facility]. I'll still take it but y'all will just have to give me dilantin all night but whatever. But yea, I'm not taking the Ativan unless it's 3 mg. Because I know it won't work because of how much Xanax I take at home and what the equivalent is." Pt very agitated and talking very loudly and rapidly. At this point charge RN stuck her head in the door and asked if pt would prefer a shot of Ativan instead. Pt stated, "Would it be 3mg ? Because it's only going to work if it's more than 2." Consulting civil engineerCharge RN said that she didn't think that MD would order more than 2. Pt stated, "Well that's bullshit! It's not going to work! Fine, just give me a shot of the ativan and the haldol! I'll just have fucking seizures!" This RN explained that if haldol gave her seizures we would not be giving her any haldol. This RN spoke with MD about ordered medications and PO meds discontinued and order for IM ativan put in.
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
## 2015-12-06 NOTE — ED Notes (Signed)
BEHAVIORAL HEALTH ROUNDING Patient sleeping: No. Patient alert and oriented: yes Behavior appropriate: Yes.  ; If no, describe:  Nutrition and fluids offered: yes Toileting and hygiene offered: Yes  Sitter present: q15 minute observations and security  monitoring Law enforcement present: Yes  ODS  

## 2015-12-06 NOTE — ED Notes (Signed)
ED tech, Raynelle FanningJulie in triage to change pt into behav scrubs; jeans/black bra/gray t-shirt/black panties removed and placed into labeled belonging bag & pt placed into behav scrubs

## 2015-12-06 NOTE — ED Notes (Signed)
BEHAVIORAL HEALTH ROUNDING  Patient sleeping: No.  Patient alert and oriented: yes  Behavior appropriate: Yes. ; If no, describe:  Nutrition and fluids offered: yes  Toileting and hygiene offered: Yes  Sitter present: q15 minute observations and security monitoring  Law enforcement present: Yes ODS  TTS currently in speaking with patient

## 2015-12-06 NOTE — ED Notes (Signed)
Pt agrees to have ED tech Raynelle FanningJulie draw blood
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
## 2015-12-07 ENCOUNTER — Inpatient Hospital Stay: Admission: EM | Admit: 2015-12-07 | Payer: Medicaid Other | Source: Intra-hospital | Admitting: Psychiatry

## 2015-12-07 DIAGNOSIS — F3113 Bipolar disorder, current episode manic without psychotic features, severe: Secondary | ICD-10-CM

## 2015-12-07 MED ORDER — DIVALPROEX SODIUM 500 MG PO DR TAB
500.0000 mg | DELAYED_RELEASE_TABLET | Freq: Two times a day (BID) | ORAL | Status: DC
  Administered 2015-12-07 – 2015-12-08 (×2): 500 mg via ORAL
  Filled 2015-12-07 (×2): qty 1

## 2015-12-07 MED ORDER — TRAZODONE HCL 100 MG PO TABS
100.0000 mg | ORAL_TABLET | Freq: Every day | ORAL | Status: DC
  Administered 2015-12-07: 100 mg via ORAL
  Filled 2015-12-07: qty 1

## 2015-12-07 MED ORDER — BENZTROPINE MESYLATE 1 MG PO TABS: 0.5000 mg | ORAL_TABLET | Freq: Two times a day (BID) | ORAL | Status: DC | PRN

## 2015-12-07 MED ORDER — LITHIUM CARBONATE 300 MG PO CAPS: 300.0000 mg | ORAL_CAPSULE | Freq: Two times a day (BID) | ORAL | Status: DC

## 2015-12-07 MED ORDER — CHLORDIAZEPOXIDE HCL 25 MG PO CAPS
25.0000 mg | ORAL_CAPSULE | Freq: Three times a day (TID) | ORAL | Status: DC
  Administered 2015-12-07 – 2015-12-08 (×2): 25 mg via ORAL
  Filled 2015-12-07 (×2): qty 1

## 2015-12-07 MED ORDER — HALOPERIDOL 0.5 MG PO TABS
1.0000 mg | ORAL_TABLET | Freq: Four times a day (QID) | ORAL | Status: DC | PRN
  Administered 2015-12-07: 1 mg via ORAL
  Filled 2015-12-07: qty 1

## 2015-12-07 MED ORDER — LORAZEPAM 1 MG PO TABS
1.0000 mg | ORAL_TABLET | Freq: Once | ORAL | Status: AC
Start: 2015-12-07 — End: 2015-12-07
  Administered 2015-12-07: 1 mg via ORAL
  Filled 2015-12-07: qty 1

## 2015-12-07 MED ORDER — PANTOPRAZOLE SODIUM 40 MG PO TBEC
40.0000 mg | DELAYED_RELEASE_TABLET | Freq: Every day | ORAL | Status: DC
Start: 2015-12-07 — End: 2015-12-08
  Administered 2015-12-07 – 2015-12-08 (×2): 40 mg via ORAL
  Filled 2015-12-07: qty 1

## 2015-12-07 MED ORDER — HALOPERIDOL 0.5 MG PO TABS
2.0000 mg | ORAL_TABLET | Freq: Two times a day (BID) | ORAL | Status: DC
  Administered 2015-12-08: 2 mg via ORAL
  Filled 2015-12-07 (×3): qty 1

## 2015-12-07 MED ORDER — ZIPRASIDONE MESYLATE 20 MG IM SOLR
20.0000 mg | Freq: Once | INTRAMUSCULAR | Status: AC
  Administered 2015-12-07: 20 mg via INTRAMUSCULAR

## 2015-12-07 MED ORDER — ZIPRASIDONE MESYLATE 20 MG IM SOLR
INTRAMUSCULAR | Status: AC
  Administered 2015-12-07: 20 mg via INTRAMUSCULAR
  Filled 2015-12-07: qty 20

## 2015-12-07 MED ORDER — PANTOPRAZOLE SODIUM 40 MG PO TBEC
DELAYED_RELEASE_TABLET | ORAL | Status: AC
  Administered 2015-12-07: 40 mg via ORAL
  Filled 2015-12-07: qty 1

## 2015-12-07 NOTE — ED Notes (Signed)
Pt continues to sleep in room with no acute distress noted. Pt awaiting psych consult.

## 2015-12-07 NOTE — ED Notes (Signed)
The patient was given a telephone to use. Checked with her nurse prior.

## 2015-12-07 NOTE — ED Notes (Addendum)
Patient stating that she would like Dr. Toni Amendlapacs to go back in and speak with her.  Informed myself and the tech that she does not feel comfortable with Dr. Toni Amendlapacs also having another person (PA student) in there with them.  Tech called BHU unit asking them to send MD back this way to speak with patient.  Patient also stated "I need some medicine for my nerves".  EDP notified of patient wanting anxiety medication.

## 2015-12-07 NOTE — Consult Note (Signed)
[place] [facility] Consult   Reason for Consult:  Consult for 30 year old woman with a history of mental health issues and substance abuse issues who presented under involuntary commitment because of agitation and aggression especially with law enforcement officers Referring Physician:  Quale Patient Identification: Janice Walls MRN:  [id number] Principal Diagnosis: Bipolar disorder [facility]) Diagnosis:   Patient Active Problem List   Diagnosis Date Noted  . Substance induced mood disorder ([place]) [F19.94] 12/07/2015  . Bipolar disorder ([place]) [F31.9] 12/07/2015  . Amphetamine abuse [F15.10] 12/07/2015  . Cocaine abuse [F14.10] 12/07/2015  . Benzodiazepine abuse [F13.10] 12/07/2015    Total Time spent with patient: 1 hour  Subjective:   Janice Walls is a 30 y.o. female patient admitted with "I got into an argument".  HPI:  Patient interviewed. Chart reviewed. Labs and vitals reviewed. 30 year old woman brought in by Event organiser. Evidently they were called to an argument the patient was in last night. She said she got into an argument with a friend of hers. Evidently this got heated enough that the police were involved. Patient was agitated also with police apparently was aggressive and resisting their taking her into custody it's reported she may have destroyed some property as well. Patient reports that she is very "sleep deprived" and hasn't slept in several days. She admits that her mood is been labile. She describes being under a lot of stress. Apparently she was in [place] recently with some other guy who got arrested. She repeats several times that he is "facing 10 years". When she talks about this she starts to cry. Patient is disorganized in her speech and it's very hard to follow all the content. Drug screen was positive for amphetamines and cocaine and benzodiazepines. No alcohol. Unclear if she is been taking medicines but probably not. She indicates that she had been  in the hospital recently. Early on she told me it was one week ago and then later changed it to several months ago. Nevertheless she said they stopped all of her medicine.  Medical history: Patient reportedly has a history of a traumatic brain injury of some sort. I'm lacking full details about it. She's had a hospitalization here in the past for a presentation with hypoglycemia for unclear reasons.  Social history: Apparently had been in [place] with another guy recently. Now she is back down here in the area. Hard to follow the thinking she has here just as it is with other things. She says that she has a house of her own in [place].  Substance abuse history: Well-documented long history of abuse of multiple drugs  Past Psychiatric History: Patient has a long history of mental health problems. Her evaluation here had not come to a complete diagnosis. Based on her medication and she gives it sounds like she likely has a bipolar disorder although symptoms are complicated by a possible brain injury and then complicated again by her ongoing substance abuse. She has been documented to be on Haldol and lithium in the past. Patient insists multiple times to me that she is not a danger to herself.  Risk to Self: Suicidal Ideation: No Suicidal Intent: No Is patient at risk for suicide?: No Suicidal Plan?: No Access to Means: No What has been your use of drugs/alcohol within the last 12 months?: Use of marijuana and alcohol How many times?: 0 Other Self Harm Risks: denied Triggers for Past Attempts: Unknown Intentional Self Injurious Behavior: None Risk to Others: Homicidal Ideation: No Thoughts of Harm to  Others: No Current Homicidal Intent: No Current Homicidal Plan: No Access to Homicidal Means: No Identified Victim: None identified History of harm to others?: No Assessment of Violence: On admission Violent Behavior Description: verbally aggressive and beligerent Does patient have access to  weapons?: No Criminal Charges Pending?: Yes Describe Pending Criminal Charges: Resisting public officer Does patient have a court date: Yes Court Date: 01/04/16 Prior Inpatient Therapy: Prior Inpatient Therapy: Yes Prior Therapy Dates: 2016 Prior Therapy Facilty/Provider(s): Bernadene Person, [facility] Reason for Treatment: Depression, si Prior Outpatient Therapy: Prior Outpatient Therapy: Yes Prior Therapy Dates: Current Prior Therapy Facilty/Provider(s): [facility] Reason for Treatment: Depression Does patient have an ACCT team?: No Does patient have Intensive In-House Services?  : No Does patient have [facility] services? : No Does patient have P4CC services?: No  Past Medical History:  Past Medical History:  Diagnosis Date  . Anxiety   . Bipolar 1 disorder ([place])   . Depression   . Insomnia     Past Surgical History:  Procedure Laterality Date  . CESAREAN SECTION    . CESAREAN SECTION     Family History: No family history on file. Family Psychiatric  History: Patient did not give me any information about this. Social History:  History  Alcohol Use  . Yes    Comment: occasional     History  Drug Use No    Social History   Social History  . Marital status: Single    Spouse name: N/A  . Number of children: N/A  . Years of education: N/A   Social History Main Topics  . Smoking status: Current Some Day Smoker    Packs/day: 0.50    Types: Cigarettes  . Smokeless tobacco: Never Used  . Alcohol use Yes     Comment: occasional  . Drug use: No  . Sexual activity: Yes    Birth control/ protection: None   Other Topics Concern  . None   Social History Narrative  . None   Additional Social History:    Allergies:   Allergies  Allergen Reactions  . Aspirin Other (See Comments)    Causes headaches  . Morphine And Related Other (See Comments)    Increases temperature    Labs:  Results for orders placed or performed during the hospital encounter of  12/06/15 (from the past 48 hour(s))  Urine Drug Screen, Qualitative     Status: Abnormal   Collection Time: 12/06/15  7:26 PM  Result Value Ref Range   Tricyclic, Ur Screen NONE DETECTED NONE DETECTED   Amphetamines, Ur Screen POSITIVE (A) NONE DETECTED   MDMA (Ecstasy)Ur Screen NONE DETECTED NONE DETECTED   Cocaine Metabolite,Ur [place] POSITIVE (A) NONE DETECTED   Opiate, Ur Screen NONE DETECTED NONE DETECTED   Phencyclidine (PCP) Ur S NONE DETECTED NONE DETECTED   Cannabinoid 50 Ng, Ur [place] POSITIVE (A) NONE DETECTED   Barbiturates, Ur Screen NONE DETECTED NONE DETECTED   Benzodiazepine, Ur Scrn POSITIVE (A) NONE DETECTED   Methadone Scn, Ur NONE DETECTED NONE DETECTED    Comment: (NOTE) 161  Tricyclics, urine               Cutoff 1000 ng/mL 200  Amphetamines, urine             Cutoff 1000 ng/mL 300  MDMA (Ecstasy), urine           Cutoff 500 ng/mL 400  Cocaine Metabolite, urine       Cutoff 300 ng/mL 500  Opiate, urine                   Cutoff 300 ng/mL 600  Phencyclidine (PCP), urine      Cutoff 25 ng/mL 700  Cannabinoid, urine              Cutoff 50 ng/mL 800  Barbiturates, urine             Cutoff 200 ng/mL 900  Benzodiazepine, urine           Cutoff 200 ng/mL 1000 Methadone, urine                Cutoff 300 ng/mL 1100 1200 The urine drug screen provides only a preliminary, unconfirmed 1300 analytical test result and should not be used for non-medical 1400 purposes. Clinical consideration and professional judgment should 1500 be applied to any positive drug screen result due to possible 1600 interfering substances. A more specific alternate chemical method 1700 must be used in order to obtain a confirmed analytical result.  1800 Gas chromato graphy / mass spectrometry (GC/MS) is the preferred 1900 confirmatory method.   Comprehensive metabolic panel     Status: Abnormal   Collection Time: 12/06/15  7:30 PM  Result Value Ref Range   Sodium 137 135 - 145 mmol/L   Potassium 3.6  3.5 - 5.1 mmol/L   Chloride 106 101 - 111 mmol/L   CO2 17 (L) 22 - 32 mmol/L   Glucose, Bld 67 65 - 99 mg/dL   BUN 21 (H) 6 - 20 mg/dL   Creatinine, Ser 0.91 0.44 - 1.00 mg/dL   Calcium 9.1 8.9 - 10.3 mg/dL   Total Protein 7.7 6.5 - 8.1 g/dL   Albumin 4.3 3.5 - 5.0 g/dL   AST 42 (H) 15 - 41 U/L   ALT 40 14 - 54 U/L   Alkaline Phosphatase 84 38 - 126 U/L   Total Bilirubin 1.4 (H) 0.3 - 1.2 mg/dL   GFR calc non Af Amer >60 >60 mL/min   GFR calc Af Amer >60 >60 mL/min    Comment: (NOTE) The eGFR has been calculated using the CKD EPI equation. This calculation has not been validated in all clinical situations. eGFR's persistently <60 mL/min signify possible Chronic Kidney Disease.    Anion gap 14 5 - 15  Ethanol     Status: None   Collection Time: 12/06/15  7:30 PM  Result Value Ref Range   Alcohol, Ethyl (B) <5 <5 mg/dL    Comment:        LOWEST DETECTABLE LIMIT FOR SERUM ALCOHOL IS 5 mg/dL FOR MEDICAL PURPOSES ONLY   Salicylate level     Status: None   Collection Time: 12/06/15  7:30 PM  Result Value Ref Range   Salicylate Lvl <9.6 2.8 - 30.0 mg/dL  Acetaminophen level     Status: Abnormal   Collection Time: 12/06/15  7:30 PM  Result Value Ref Range   Acetaminophen (Tylenol), Serum <10 (L) 10 - 30 ug/mL    Comment:        THERAPEUTIC CONCENTRATIONS VARY SIGNIFICANTLY. A RANGE OF 10-30 ug/mL MAY BE AN EFFECTIVE CONCENTRATION FOR MANY PATIENTS. HOWEVER, SOME ARE BEST TREATED AT CONCENTRATIONS OUTSIDE THIS RANGE. ACETAMINOPHEN CONCENTRATIONS >150 ug/mL AT 4 HOURS AFTER INGESTION AND >50 ug/mL AT 12 HOURS AFTER INGESTION ARE OFTEN ASSOCIATED WITH TOXIC REACTIONS.   cbc     Status: Abnormal   Collection Time: 12/06/15  7:30 PM  Result Value Ref Range  WBC 15.8 (H) 3.6 - 11.0 K/uL   RBC 4.58 3.80 - 5.20 MIL/uL   Hemoglobin 14.2 12.0 - 16.0 g/dL   HCT 41.8 35.0 - 47.0 %   MCV 91.3 80.0 - 100.0 fL   MCH 31.1 26.0 - 34.0 pg   MCHC 34.0 32.0 - 36.0 g/dL   RDW 14.2  11.5 - 14.5 %   Platelets 216 150 - 440 K/uL  Rapid HIV screen (HIV 1/2 Ab+Ag)     Status: None   Collection Time: 12/06/15  7:30 PM  Result Value Ref Range   HIV-1 P24 Antigen - HIV24 NON REACTIVE NON REACTIVE   HIV 1/2 Antibodies NON REACTIVE NON REACTIVE   Interpretation (HIV Ag Ab)      A non reactive test result means that HIV 1 or HIV 2 antibodies and HIV 1 p24 antigen were not detected in the specimen.  Pregnancy, urine POC     Status: None   Collection Time: 12/06/15  7:38 PM  Result Value Ref Range   Preg Test, Ur NEGATIVE NEGATIVE    Comment:        THE SENSITIVITY OF THIS METHODOLOGY IS >24 mIU/mL     Current Facility-Administered Medications  Medication Dose Route Frequency Provider Last Rate Last Dose  . benztropine (COGENTIN) tablet 0.5 mg  0.5 mg Oral BID PRN Gonzella Lex, MD      . chlordiazePOXIDE (LIBRIUM) capsule 25 mg  25 mg Oral TID Gonzella Lex, MD      . haloperidol (HALDOL) tablet 1 mg  1 mg Oral Q6H PRN Gonzella Lex, MD      . haloperidol (HALDOL) tablet 2 mg  2 mg Oral BID Gonzella Lex, MD      . lithium carbonate capsule 300 mg  300 mg Oral BID WC Navah Grondin T Lasheika Ortloff, MD      . pantoprazole (PROTONIX) EC tablet 40 mg  40 mg Oral Daily Gonzella Lex, MD      . traZODone (DESYREL) tablet 100 mg  100 mg Oral QHS Gonzella Lex, MD       Current Outpatient Prescriptions  Medication Sig Dispense Refill  . cetirizine (ZYRTEC) 10 MG tablet Take 10 mg by mouth daily as needed.  0  . FLUoxetine (PROZAC) 40 MG capsule Take 1 capsule by mouth daily.  0  . haloperidol decanoate (HALDOL DECANOATE) 50 MG/ML injection 1 mL every 30 (thirty) days.  0  . hydrOXYzine (VISTARIL) 50 MG capsule Take 1 capsule by mouth every 6 (six) hours as needed.  0  . lithium carbonate (LITHOBID) 300 MG CR tablet Take 1 tablet by mouth 2 (two) times daily.  1  . omeprazole (PRILOSEC) 20 MG capsule Take 1 capsule (20 mg total) by mouth daily. (Patient not taking: Reported on 07/16/2015)  30 capsule 0  . oxyCODONE-acetaminophen (ROXICET) 5-325 MG tablet Take 1 tablet by mouth every 6 (six) hours as needed. (Patient not taking: Reported on 07/16/2015) 20 tablet 0  . potassium chloride 20 MEQ TBCR Take 20 mEq by mouth daily. 7 tablet 0  . promethazine (PHENERGAN) 12.5 MG suppository Place 1 suppository (12.5 mg total) rectally every 6 (six) hours as needed for nausea or vomiting. 12 each 0  . promethazine (PHENERGAN) 12.5 MG tablet Take 1 tablet (12.5 mg total) by mouth every 6 (six) hours as needed for nausea or vomiting. 30 tablet 0  . traZODone (DESYREL) 50 MG tablet Take 2 tablets by mouth at bedtime.  2    Musculoskeletal: Strength & Muscle Tone: decreased Gait & Station: unsteady Patient leans: N/A  Psychiatric Specialty Exam: Physical Exam  Nursing note and vitals reviewed. Constitutional: She appears well-developed and well-nourished.  HENT:  Head: Normocephalic and atraumatic.  Eyes: Conjunctivae are normal. Pupils are equal, round, and reactive to light.  Neck: Normal range of motion.  Cardiovascular: Regular rhythm and normal heart sounds.   Respiratory: Effort normal. No respiratory distress.  GI: Soft.  Musculoskeletal: Normal range of motion.  Neurological: She is alert.  Skin: Skin is warm and dry.  Psychiatric: Her mood appears anxious. Her affect is labile. Her speech is delayed. She is agitated. Cognition and memory are impaired. She expresses impulsivity. She expresses suicidal ideation. She is inattentive.    Review of Systems  Constitutional: Negative.   HENT: Negative.   Eyes: Negative.   Respiratory: Negative.   Cardiovascular: Negative.   Gastrointestinal: Negative.   Musculoskeletal: Negative.   Skin: Negative.   Neurological: Negative.   Psychiatric/Behavioral: Positive for depression and substance abuse. Negative for hallucinations and suicidal ideas. The patient is nervous/anxious and has insomnia.     Blood pressure 112/69, pulse 84,  temperature 97.8 F (36.6 C), temperature source Oral, resp. rate 18, height _0  (1.575 m), weight 61.2 kg (135 lb), last menstrual period 11/05/2015, SpO2 98 %.Body mass index is 24.69 kg/m.  General Appearance: Disheveled  Eye Contact:  Fair  Speech:  Pressured  Volume:  Increased  Mood:  Dysphoric and Irritable  Affect:  Labile  Thought Process:  Disorganized  Orientation:  Full (Time, Place, and Person)  Thought Content:  Illogical  Suicidal Thoughts:  No  Homicidal Thoughts:  No  Memory:  Immediate;   Fair Recent;   Fair Remote;   Fair  Judgement:  Impaired  Insight:  Shallow  Psychomotor Activity:  Decreased  Concentration:  Concentration: Fair  Recall:  [employer] of Knowledge:  Fair  Language:  Fair  Akathisia:  No  Handed:  Right  AIMS (if indicated):     Assets:  Desire for Improvement Housing Resilience  ADL's:  Impaired  Cognition:  Impaired,  Mild  Sleep:        Treatment Plan Summary: Daily contact with patient to assess and evaluate symptoms and progress in treatment, Medication management and Plan 30 year old woman with a history of mood instability and substance abuse comes in under involuntary commitment. On interview today her thoughts are still disorganized and scattered. Mood is labile. She appears to be off her medicine. Given recent behavior and chaotic examination I think that the safest thing is to admit her to the hospital. A pulled IVC and admit patient to the hospital. I'm restarting her on a modest dose of Haldol and lithium commensurate with what was previously been prescribed. When necessary medicine available for agitation and for sleep. I will repeat her labs tomorrow morning. Her sugar was on the low side when she came in last night again but not as severely as when she presented before. I'm also been a put her on at least 3 doses of Librium to try and hold off any Xanax withdrawal effects and be part of the issue.  Disposition: Recommend  psychiatric Inpatient admission when medically cleared.  Alethia Berthold, MD 12/07/2015 3:15 PM

## 2015-12-07 NOTE — ED Notes (Signed)
Report attempted to be given.

## 2015-12-07 NOTE — ED Notes (Signed)
Dr.Clapacs at bedside  

## 2015-12-07 NOTE — ED Notes (Signed)
Patient's mother given visitation with patient after getting permission from patient.  Patient specifically verbalized only wanting to see Georgine and not ChinaBobby.  During visitation, animosity was noted between both parties.  Patient and guest instructed they would need to lower their voices or the visitor would have to be escorted out. Patient very tearful and aggitated after visitation with mother was completed.  Informed patient that no more visitations would be allowed to occur today. Patient verbalized understanding of this information.

## 2015-12-07 NOTE — ED Notes (Signed)
BEHAVIORAL HEALTH ROUNDING  Patient sleeping: Yes Patient alert and oriented: Sleeping Behavior appropriate: Yes. ; If no, describe:  Nutrition and fluids offered: No, sleeping  Toileting and hygiene offered: No, sleeping  Sitter present: q15 minute observations and security monitoring  Law enforcement present: Yes ODS 

## 2015-12-07 NOTE — ED Notes (Signed)
Patient mother called for update.

## 2015-12-07 NOTE — ED Notes (Signed)
Doctor in room with patient at this  time

## 2015-12-07 NOTE — ED Provider Notes (Signed)
-----------------------------------------   6:51 AM on 12/07/2015 -----------------------------------------   Blood pressure 114/79, pulse (!) 115, temperature 98 F (36.7 C), temperature source Oral, resp. rate 18, height 5\' 2"  (1.575 m), weight 135 lb (61.2 kg), last menstrual period 11/05/2015, SpO2 98 %.  The patient had no acute events since last update.  Calm and cooperative at this time.  Disposition is pending per Psychiatry/Behavioral Medicine team recommendations.     Irean HongJade J Delesa Kawa, MD 12/07/15 [phone]

## 2015-12-07 NOTE — ED Notes (Signed)
Patient up to bathroom at this time.

## 2015-12-07 NOTE — Progress Notes (Addendum)
[facility] by Dr. Toni Amendlapacs.  Attending Physician will be Dr. Jennet MaduroPucilowska.   Patient [facility] Nurse [place].   Intake Paper Work has been signed and placed on patient chart.  ER staff is aware of the admission Misty Stanley( [facility]  ER MD; Jeannett SeniorStephen Patient's Nurse & Byrd HesselbachMaria Patient Access).   12/07/2015 Cheryl FlashNicole Saxon Crosby, MS, NCC, LPCA Therapeutic Triage Specialist

## 2015-12-07 NOTE — Consult Note (Signed)
  Psychiatry: Updated note. Patient was briefly reevaluated this afternoon which escalated into an emotional outburst on her part. Patient became very agitated with little or no provocation. Got disorganized and hostile in her thinking and eventually started trying to herself requiring intervention from emergency room staff. I asked that she be given a shot of Geodon one time. I have added when necessary medicine for anxiety and agitation to her orders. Patient continues to require psychiatric hospitalization may need some further stabilization before team downstairs can be comfortable with her.

## 2015-12-07 NOTE — ED Notes (Signed)
Patient has a visitor at this time. 

## 2015-12-08 MED ORDER — DIVALPROEX SODIUM 500 MG PO DR TAB: 500.0000 mg | DELAYED_RELEASE_TABLET | Freq: Two times a day (BID) | ORAL | 1 refills | Status: DC

## 2015-12-08 MED ORDER — HYDROXYZINE HCL 25 MG PO TABS: 25.0000 mg | ORAL_TABLET | Freq: Three times a day (TID) | ORAL | Status: DC | PRN

## 2015-12-08 MED ORDER — HALOPERIDOL 2 MG PO TABS: 2.0000 mg | ORAL_TABLET | Freq: Two times a day (BID) | ORAL | 1 refills | Status: DC

## 2015-12-08 MED ORDER — HYDROXYZINE HCL 25 MG PO TABS: 25.0000 mg | ORAL_TABLET | Freq: Three times a day (TID) | ORAL | 1 refills | Status: DC | PRN

## 2015-12-08 NOTE — Progress Notes (Signed)
As requested Per Dr. Lucianne MussKumar pt has been reassessed to evaluated the need for inpatient placement.  Pt alert and oriented X4. Pt affect is labile and her mood in incongruent. Pt denies that she is depressed although she is tearful throughout the interview. Pt states that she continues to have issues with obtaining restful sleep although she states that this is an ongoing. Pt. denies any suicidal ideation, plan or intent. Pt. denies the presence of any auditory or visual hallucinations at this time. Patient has been informed that her mother has called requesting to speak with a staff member in regards to her care. Writer requested consent to contact the pts mother. Pt initially began to cry and stated " I know my mother loves me but I think she tries to keep me in the hospital because she does not have time for me." Pt then provided writer with her mothers' name and contact information. Pt gave verbal consent to speak with her mothe,r Janice Walls, stating that she wanted to do what ever she needs to do in order to be discharged.   TTS has contacted the pts mother Janice Walls @ [phone]. Pts mother reports that the pt OD 2 years ago and that she has continued to decompensated everysince. She reports that the pts drug use is not her primary issue. She states " there is a lot of mental health problems." Pts mother states " I know that she will kill herself of some one else, she need needs long tem placement."    12/08/2015 Janice FlashNicole Clois Montavon, MS, NCC, LPCA Therapeutic Triage Specialist

## 2015-12-08 NOTE — ED Notes (Signed)
Patient visited with mother. They were arguing and mother was escorted off the unit by security.

## 2015-12-08 NOTE — Consult Note (Signed)
[facility] Consult   Reason for Consult:  This is a reevaluation of this 30 year old woman with a history of substance-induced mood problems bipolar disorder and possible brain injury. Referring Physician:  Quale Patient Identification: Khadeja Abt MRN:  [id number] Principal Diagnosis: Bipolar disorder [facility]) Diagnosis:   Patient Active Problem List   Diagnosis Date Noted  . Substance induced mood disorder ([place]) [F19.94] 12/07/2015  . Bipolar disorder ([place]) [F31.9] 12/07/2015  . Amphetamine abuse [F15.10] 12/07/2015  . Cocaine abuse [F14.10] 12/07/2015  . Benzodiazepine abuse [F13.10] 12/07/2015    Total Time spent with patient: 45 minutes  Subjective:   Pailynn Vahey is a 30 y.o. female patient admitted with "I'm sorry, I'm feeling better".  HPI:  Patient interviewed. Chart reviewed. Case discussed with TTS and nursing. This 30 year old woman was seen yesterday in the emergency room. At that time she was extremely labile and agitated. She appeared to be having extremely disorganized thinking and behavior. I had placed admission orders for her but because of her behavior we deferred taking her downstairs. Overnight the patient has calm down significantly. On reevaluation today the patient is able to tell much more lucid story than she did yesterday. Patient is able to clearly describe the events of the last week. She is able to show reasonable insight into how substance abuse and a disruption in her routine in lead II agitated confused behavior and some degree of thought disorder. Having gotten some sleep and taking Haldol and Depakote she is much calmer. Patient denies having any suicidal or homicidal thoughts at all. Denies any current psychotic symptoms. Patient spontaneously states an appropriate plan to go to [facility] for outpatient mental health and substance abuse care. She states that she intends to stay with people who will not condone drug use. She will stay off  of all nonprescribed abusable drugs. She is agreeable to at least for now continuing Haldol and Depakote and Vistaril. Prescriptions will be given. At this point I think she no longer meets commitment criteria and can be discharged. Patient is still at high risk for behavior if she begins abusing substances or falls out of treatment.  Past Psychiatric History: Long history of mood instability and substance abuse. 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 Unclear what long-term sequela there are from that.  Risk to Self: Suicidal Ideation: No Suicidal Intent: No Is patient at risk for suicide?: No Suicidal Plan?: No Access to Means: No What has been your use of drugs/alcohol within the last 12 months?: Use of marijuana and alcohol How many times?: 0 Other Self Harm Risks: denied Triggers for Past Attempts: Unknown Intentional Self Injurious Behavior: None Risk to Others: Homicidal Ideation: No Thoughts of Harm to Others: No Current Homicidal Intent: No Current Homicidal Plan: No Access to Homicidal Means: No Identified Victim: None identified History of harm to others?: No Assessment of Violence: On admission Violent Behavior Description: verbally aggressive and beligerent Does patient have access to weapons?: No Criminal Charges Pending?: Yes Describe Pending Criminal Charges: Resisting public officer Does patient have a court date: Yes Court Date: 01/04/16 Prior Inpatient Therapy: Prior Inpatient Therapy: Yes Prior Therapy Dates: 2016 Prior Therapy Facilty/Provider(s): Bernadene Person, [facility] Reason for Treatment: Depression, si Prior Outpatient Therapy: Prior Outpatient Therapy: Yes Prior Therapy Dates: Current Prior Therapy Facilty/Provider(s): [facility] Reason for Treatment: Depression Does patient have an ACCT team?: No Does patient have Intensive In-House Services?  : No Does patient have [facility] services? : No Does  patient have P4CC  services?: No  Past Medical History:  Past Medical History:  Diagnosis Date  . Anxiety   . Bipolar 1 disorder ([place])   . Depression   . Insomnia     Past Surgical History:  Procedure Laterality Date  . CESAREAN SECTION    . CESAREAN SECTION     Family History: No family history on file. Family Psychiatric  History: Mood disorders Social History:  History  Alcohol Use  . Yes    Comment: occasional     History  Drug Use No    Social History   Social History  . Marital status: Single    Spouse name: N/A  . Number of children: N/A  . Years of education: N/A   Social History Main Topics  . Smoking status: Current Some Day Smoker    Packs/day: 0.50    Types: Cigarettes  . Smokeless tobacco: Never Used  . Alcohol use Yes     Comment: occasional  . Drug use: No  . Sexual activity: Yes    Birth control/ protection: None   Other Topics Concern  . None   Social History Narrative  . None   Additional Social History:    Allergies:   Allergies  Allergen Reactions  . Aspirin Other (See Comments)    Causes headaches  . Morphine And Related Other (See Comments)    Increases temperature    Labs:  Results for orders placed or performed during the hospital encounter of 12/06/15 (from the past 48 hour(s))  Urine Drug Screen, Qualitative     Status: Abnormal   Collection Time: 12/06/15  7:26 PM  Result Value Ref Range   Tricyclic, Ur Screen NONE DETECTED NONE DETECTED   Amphetamines, Ur Screen POSITIVE (A) NONE DETECTED   MDMA (Ecstasy)Ur Screen NONE DETECTED NONE DETECTED   Cocaine Metabolite,Ur Ten Sleep POSITIVE (A) NONE DETECTED   Opiate, Ur Screen NONE DETECTED NONE DETECTED   Phencyclidine (PCP) Ur S NONE DETECTED NONE DETECTED   Cannabinoid 50 Ng, Ur  POSITIVE (A) NONE DETECTED   Barbiturates, Ur Screen NONE DETECTED NONE DETECTED   Benzodiazepine, Ur Scrn POSITIVE (A) NONE DETECTED   Methadone Scn, Ur NONE DETECTED NONE DETECTED    Comment: (NOTE) 956   Tricyclics, urine               Cutoff 1000 ng/mL 200  Amphetamines, urine             Cutoff 1000 ng/mL 300  MDMA (Ecstasy), urine           Cutoff 500 ng/mL 400  Cocaine Metabolite, urine       Cutoff 300 ng/mL 500  Opiate, urine                   Cutoff 300 ng/mL 600  Phencyclidine (PCP), urine      Cutoff 25 ng/mL 700  Cannabinoid, urine              Cutoff 50 ng/mL 800  Barbiturates, urine             Cutoff 200 ng/mL 900  Benzodiazepine, urine           Cutoff 200 ng/mL 1000 Methadone, urine                Cutoff 300 ng/mL 1100 1200 The urine drug screen provides only a preliminary, unconfirmed 1300 analytical test result and should not be used for non-medical 1400 purposes. Clinical consideration  and professional judgment should 1500 be applied to any positive drug screen result due to possible 1600 interfering substances. A more specific alternate chemical method 1700 must be used in order to obtain a confirmed analytical result.  1800 Gas chromato graphy / mass spectrometry (GC/MS) is the preferred 1900 confirmatory method.   Comprehensive metabolic panel     Status: Abnormal   Collection Time: 12/06/15  7:30 PM  Result Value Ref Range   Sodium 137 135 - 145 mmol/L   Potassium 3.6 3.5 - 5.1 mmol/L   Chloride 106 101 - 111 mmol/L   CO2 17 (L) 22 - 32 mmol/L   Glucose, Bld 67 65 - 99 mg/dL   BUN 21 (H) 6 - 20 mg/dL   Creatinine, Ser 0.91 0.44 - 1.00 mg/dL   Calcium 9.1 8.9 - 10.3 mg/dL   Total Protein 7.7 6.5 - 8.1 g/dL   Albumin 4.3 3.5 - 5.0 g/dL   AST 42 (H) 15 - 41 U/L   ALT 40 14 - 54 U/L   Alkaline Phosphatase 84 38 - 126 U/L   Total Bilirubin 1.4 (H) 0.3 - 1.2 mg/dL   GFR calc non Af Amer >60 >60 mL/min   GFR calc Af Amer >60 >60 mL/min    Comment: (NOTE) The eGFR has been calculated using the CKD EPI equation. This calculation has not been validated in all clinical situations. eGFR's persistently <60 mL/min signify possible Chronic Kidney Disease.    Anion  gap 14 5 - 15  Ethanol     Status: None   Collection Time: 12/06/15  7:30 PM  Result Value Ref Range   Alcohol, Ethyl (B) <5 <5 mg/dL    Comment:        LOWEST DETECTABLE LIMIT FOR SERUM ALCOHOL IS 5 mg/dL FOR MEDICAL PURPOSES ONLY   Salicylate level     Status: None   Collection Time: 12/06/15  7:30 PM  Result Value Ref Range   Salicylate Lvl <5.4 2.8 - 30.0 mg/dL  Acetaminophen level     Status: Abnormal   Collection Time: 12/06/15  7:30 PM  Result Value Ref Range   Acetaminophen (Tylenol), Serum <10 (L) 10 - 30 ug/mL    Comment:        THERAPEUTIC CONCENTRATIONS VARY SIGNIFICANTLY. A RANGE OF 10-30 ug/mL MAY BE AN EFFECTIVE CONCENTRATION FOR MANY PATIENTS. HOWEVER, SOME ARE BEST TREATED AT CONCENTRATIONS OUTSIDE THIS RANGE. ACETAMINOPHEN CONCENTRATIONS >150 ug/mL AT 4 HOURS AFTER INGESTION AND >50 ug/mL AT 12 HOURS AFTER INGESTION ARE OFTEN ASSOCIATED WITH TOXIC REACTIONS.   cbc     Status: Abnormal   Collection Time: 12/06/15  7:30 PM  Result Value Ref Range   WBC 15.8 (H) 3.6 - 11.0 K/uL   RBC 4.58 3.80 - 5.20 MIL/uL   Hemoglobin 14.2 12.0 - 16.0 g/dL   HCT 41.8 35.0 - 47.0 %   MCV 91.3 80.0 - 100.0 fL   MCH 31.1 26.0 - 34.0 pg   MCHC 34.0 32.0 - 36.0 g/dL   RDW 14.2 11.5 - 14.5 %   Platelets 216 150 - 440 K/uL  Rapid HIV screen (HIV 1/2 Ab+Ag)     Status: None   Collection Time: 12/06/15  7:30 PM  Result Value Ref Range   HIV-1 P24 Antigen - HIV24 NON REACTIVE NON REACTIVE   HIV 1/2 Antibodies NON REACTIVE NON REACTIVE   Interpretation (HIV Ag Ab)      A non reactive test result means that HIV 1  or HIV 2 antibodies and HIV 1 p24 antigen were not detected in the specimen.  Pregnancy, urine POC     Status: None   Collection Time: 12/06/15  7:38 PM  Result Value Ref Range   Preg Test, Ur NEGATIVE NEGATIVE    Comment:        THE SENSITIVITY OF THIS METHODOLOGY IS >24 mIU/mL     Current Facility-Administered Medications  Medication Dose Route Frequency  Provider Last Rate Last Dose  . benztropine (COGENTIN) tablet 0.5 mg  0.5 mg Oral BID PRN Gonzella Lex, MD      . chlordiazePOXIDE (LIBRIUM) capsule 25 mg  25 mg Oral TID Gonzella Lex, MD   25 mg at 12/08/15 1122  . divalproex (DEPAKOTE) DR tablet 500 mg  500 mg Oral Q12H Gonzella Lex, MD   500 mg at 12/08/15 1122  . haloperidol (HALDOL) tablet 1 mg  1 mg Oral Q6H PRN Gonzella Lex, MD   1 mg at 12/07/15 2058  . haloperidol (HALDOL) tablet 2 mg  2 mg Oral BID Gonzella Lex, MD   2 mg at 12/08/15 1122  . hydrOXYzine (ATARAX/VISTARIL) tablet 25 mg  25 mg Oral TID PRN Gonzella Lex, MD      . pantoprazole (PROTONIX) EC tablet 40 mg  40 mg Oral Daily Gonzella Lex, MD   40 mg at 12/08/15 1122  . traZODone (DESYREL) tablet 100 mg  100 mg Oral QHS Gonzella Lex, MD   100 mg at 12/07/15 2128   Current Outpatient Prescriptions  Medication Sig Dispense Refill  . cetirizine (ZYRTEC) 10 MG tablet Take 10 mg by mouth daily as needed.  0  . divalproex (DEPAKOTE) 500 MG DR tablet Take 1 tablet (500 mg total) by mouth every 12 (twelve) hours. 90 tablet 1  . FLUoxetine (PROZAC) 40 MG capsule Take 1 capsule by mouth daily.  0  . haloperidol (HALDOL) 2 MG tablet Take 1 tablet (2 mg total) by mouth 2 (two) times daily. 60 tablet 1  . haloperidol decanoate (HALDOL DECANOATE) 50 MG/ML injection 1 mL every 30 (thirty) days.  0  . hydrOXYzine (ATARAX/VISTARIL) 25 MG tablet Take 1 tablet (25 mg total) by mouth 3 (three) times daily as needed for anxiety. 30 tablet 1  . hydrOXYzine (VISTARIL) 50 MG capsule Take 1 capsule by mouth every 6 (six) hours as needed.  0  . lithium carbonate (LITHOBID) 300 MG CR tablet Take 1 tablet by mouth 2 (two) times daily.  1  . omeprazole (PRILOSEC) 20 MG capsule Take 1 capsule (20 mg total) by mouth daily. (Patient not taking: Reported on 07/16/2015) 30 capsule 0  . oxyCODONE-acetaminophen (ROXICET) 5-325 MG tablet Take 1 tablet by mouth every 6 (six) hours as needed. (Patient  not taking: Reported on 07/16/2015) 20 tablet 0  . potassium chloride 20 MEQ TBCR Take 20 mEq by mouth daily. 7 tablet 0  . promethazine (PHENERGAN) 12.5 MG suppository Place 1 suppository (12.5 mg total) rectally every 6 (six) hours as needed for nausea or vomiting. 12 each 0  . promethazine (PHENERGAN) 12.5 MG tablet Take 1 tablet (12.5 mg total) by mouth every 6 (six) hours as needed for nausea or vomiting. 30 tablet 0  . traZODone (DESYREL) 50 MG tablet Take 2 tablets by mouth at bedtime.  2    Musculoskeletal: Strength & Muscle Tone: within normal limits Gait & Station: normal Patient leans: N/A  Psychiatric Specialty Exam: Physical Exam  Nursing note and vitals reviewed. Constitutional: She appears well-developed and well-nourished.  HENT:  Head: Normocephalic and atraumatic.  Eyes: Conjunctivae are normal. Pupils are equal, round, and reactive to light.  Neck: Normal range of motion.  Cardiovascular: Regular rhythm and normal heart sounds.   Respiratory: Effort normal. No respiratory distress.  GI: Soft.  Musculoskeletal: Normal range of motion.  Neurological: She is alert.  Skin: Skin is warm and dry.  Psychiatric: Her speech is normal and behavior is normal. Her mood appears anxious. Cognition and memory are normal. She expresses no homicidal and no suicidal ideation.    Review of Systems  Constitutional: Negative.   HENT: Negative.   Eyes: Negative.   Respiratory: Negative.   Cardiovascular: Negative.   Gastrointestinal: Negative.   Musculoskeletal: Negative.   Skin: Negative.   Neurological: Negative.   Psychiatric/Behavioral: Positive for substance abuse. Negative for depression, hallucinations, memory loss and suicidal ideas. The patient is nervous/anxious and has insomnia.     Blood pressure 107/67, pulse 84, temperature 98.9 F (37.2 C), temperature source Oral, resp. rate 18, height _0  (1.575 m), weight 61.2 kg (135 lb), last menstrual period 11/05/2015,  SpO2 100 %.Body mass index is 24.69 kg/m.  General Appearance: Casual  Eye Contact:  Good  Speech:  Clear and Coherent  Volume:  Normal  Mood:  Anxious  Affect:  Appropriate  Thought Process:  Goal Directed  Orientation:  Full (Time, Place, and Person)  Thought Content:  Logical and Tangential  Suicidal Thoughts:  No  Homicidal Thoughts:  No  Memory:  Immediate;   Good Recent;   Fair Remote;   Fair  Judgement:  Fair  Insight:  Fair  Psychomotor Activity:  Normal  Concentration:  Concentration: Fair  Recall:  [employer] of Knowledge:  Fair  Language:  Fair  Akathisia:  No  Handed:  Right  AIMS (if indicated):     Assets:  Communication Skills Desire for Improvement Housing Physical Health Resilience  ADL's:  Intact  Cognition:  Impaired,  Mild  Sleep:        Treatment Plan Summary: Medication management and Plan Having reassess the patient today we will change the plan. I will discontinue IVC. Patient will be given a specific referral back to [facility] if possible or [facility] if [facility] is not available. She will be referred to go back to substance abuse and mental health treatment. I am giving her prescriptions for Depakote Haldol and Vistaril for a month with a refill on it to encourage her to stay on appropriate medication. Patient understands that she needs to settle down and get her life stable and absolutely stay off of abusing any drugs. Case will be reviewed with emergency room physician and patient can be discharged. We acknowledge patient is still at high risk for behavior problems if she does not follow through with appropriate behavior but under the current circumstances it's appropriate to try outpatient treatment.  Disposition: Patient does not meet criteria for psychiatric inpatient admission. Supportive therapy provided about ongoing stressors.  Alethia Berthold, MD 12/08/2015 1:12 PM

## 2015-12-08 NOTE — ED Notes (Signed)
Pt taking shower.  

## 2015-12-08 NOTE — ED Notes (Signed)
Patient discharged ambulatory to care of family. She denies SI or HI. Discharge instructions reviewed with patient, she verbalizes understanding. Patient received all personal belongings.

## 2015-12-08 NOTE — ED Notes (Signed)
Report given to Janice MuskratAmy Harris RN.Janice Walls. [facility]

## 2015-12-08 NOTE — ED Provider Notes (Addendum)
-----------------------------------------   7:31 AM on 12/08/2015 -----------------------------------------   Blood pressure 113/78, pulse 94, temperature 97.8 F (36.6 C), temperature source Oral, resp. rate 18, height 5\' 2"  (1.575 m), weight 135 lb (61.2 kg), last menstrual period 11/05/2015, SpO2 99 %.  The patient had no acute events since last update.  Calm and cooperative at this time.  Disposition is pending per Psychiatry/Behavioral Medicine team recommendations.    Patient has been seen, evaluated, and cleared by psychiatry and the patient's plan now is to discharge home. She does have follow-up arranged with [facility]. Jennye MoccasinBrian S Quigley, MD 12/08/15 [id number]    Jennye MoccasinBrian S Quigley, MD 12/08/15 [phone]

## 2015-12-08 NOTE — Discharge Instructions (Signed)
Please return immediately if condition worsens. Please contact her primary physician or the physician you were given for referral. If you have any specialist physicians involved in her treatment and plan please also contact them. Thank you for using [facility]. ° °

## 2015-12-08 NOTE — ED Notes (Signed)
Patient awake, alert, and oriented. She denies SI or HI. Her mood is labile during interview. Speech is pressured, thoughts somewhat tangential. Patient wants to be discharged. Explained to patient that she is currently under commitment papers and only the psychiatrist can make disposition decision.  Patient currently meeting with MD.

## 2015-12-08 NOTE — ED Notes (Signed)

## 2016-05-24 ENCOUNTER — Encounter: Payer: Self-pay | Admitting: *Deleted

## 2016-05-24 ENCOUNTER — Emergency Department
Admission: EM | Admit: 2016-05-24 | Discharge: 2016-05-24 | Disposition: A | Discharge status: Home / Self Care | Payer: Medicaid Other | Attending: Emergency Medicine | Admitting: Emergency Medicine

## 2016-05-24 DIAGNOSIS — Z5321 Procedure and treatment not carried out due to patient leaving prior to being seen by health care provider: Secondary | ICD-10-CM | POA: Diagnosis not present

## 2016-05-24 DIAGNOSIS — H9201 Otalgia, right ear: Secondary | ICD-10-CM | POA: Diagnosis present

## 2016-05-24 DIAGNOSIS — F17210 Nicotine dependence, cigarettes, uncomplicated: Secondary | ICD-10-CM | POA: Diagnosis not present

## 2016-05-24 NOTE — ED Triage Notes (Signed)
Pt states right ear pain that began yesterday

## 2016-07-15 ENCOUNTER — Other Ambulatory Visit
Admission: AD | Admit: 2016-07-15 | Discharge: 2016-07-15 | Disposition: A | Discharge status: Home / Self Care | Payer: Medicaid Other | Attending: Family Medicine | Admitting: Family Medicine

## 2016-07-25 ENCOUNTER — Emergency Department
Admission: EM | Admit: 2016-07-25 | Discharge: 2016-07-25 | Discharge status: Against Medical Advice | Payer: Medicaid Other

## 2016-07-26 ENCOUNTER — Encounter: Payer: Self-pay | Admitting: Emergency Medicine

## 2016-07-26 ENCOUNTER — Emergency Department
Admission: EM | Admit: 2016-07-26 | Discharge: 2016-07-26 | Disposition: A | Discharge status: Home / Self Care | Payer: Medicaid Other | Attending: Emergency Medicine | Admitting: Emergency Medicine

## 2016-07-26 DIAGNOSIS — F17210 Nicotine dependence, cigarettes, uncomplicated: Secondary | ICD-10-CM | POA: Insufficient documentation

## 2016-07-26 DIAGNOSIS — L03114 Cellulitis of left upper limb: Secondary | ICD-10-CM | POA: Diagnosis not present

## 2016-07-26 DIAGNOSIS — Z79899 Other long term (current) drug therapy: Secondary | ICD-10-CM | POA: Insufficient documentation

## 2016-07-26 DIAGNOSIS — L0291 Cutaneous abscess, unspecified: Secondary | ICD-10-CM

## 2016-07-26 DIAGNOSIS — L02414 Cutaneous abscess of left upper limb: Secondary | ICD-10-CM | POA: Diagnosis present

## 2016-07-26 LAB — URINE DRUG SCREEN, QUALITATIVE (ARMC ONLY)
Amphetamines, Ur Screen: NOT DETECTED
BARBITURATES, UR SCREEN: NOT DETECTED
Benzodiazepine, Ur Scrn: NOT DETECTED
COCAINE METABOLITE, UR ~~LOC~~: NOT DETECTED
Cannabinoid 50 Ng, Ur ~~LOC~~: POSITIVE — AB
MDMA (ECSTASY) UR SCREEN: NOT DETECTED
METHADONE SCREEN, URINE: NOT DETECTED
Opiate, Ur Screen: NOT DETECTED
Phencyclidine (PCP) Ur S: NOT DETECTED
TRICYCLIC, UR SCREEN: NOT DETECTED

## 2016-07-26 LAB — POCT PREGNANCY, URINE: PREG TEST UR: NEGATIVE

## 2016-07-26 MED ORDER — SULFAMETHOXAZOLE-TRIMETHOPRIM 800-160 MG PO TABS: 1.0000 | ORAL_TABLET | Freq: Two times a day (BID) | ORAL | 0 refills | Status: DC

## 2016-07-26 MED ORDER — SULFAMETHOXAZOLE-TRIMETHOPRIM 800-160 MG PO TABS
1.0000 | ORAL_TABLET | Freq: Once | ORAL | Status: AC
  Administered 2016-07-26: 1 via ORAL
  Filled 2016-07-26: qty 1

## 2016-07-26 NOTE — ED Triage Notes (Signed)
Pt in via POV with complaints of possible abscess to left wrist. Pt reports area started as a "pimple" and has worsened over the last few days.  Area red, swollen, pt denies any exudate from area.
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
## 2016-07-26 NOTE — Discharge Instructions (Signed)
Take the antibiotic as directed for your skin infection and cellulitis. Apply warm compresses to promote healing. DO NOT poke, pinch, squeeze, or stick anything into the infected skin. Return to the ED as needed for increased pain, redness, or swelling.

## 2016-09-10 ENCOUNTER — Encounter: Payer: Self-pay | Admitting: Emergency Medicine

## 2016-09-10 ENCOUNTER — Emergency Department
Admission: EM | Admit: 2016-09-10 | Discharge: 2016-09-12 | Disposition: A | Discharge status: Other Acute Inpatient Hospital | Payer: Medicaid Other | Attending: Student in an Organized Health Care Education/Training Program | Admitting: Student in an Organized Health Care Education/Training Program

## 2016-09-10 DIAGNOSIS — F1290 Cannabis use, unspecified, uncomplicated: Secondary | ICD-10-CM | POA: Insufficient documentation

## 2016-09-10 DIAGNOSIS — F3113 Bipolar disorder, current episode manic without psychotic features, severe: Secondary | ICD-10-CM

## 2016-09-10 DIAGNOSIS — Z79899 Other long term (current) drug therapy: Secondary | ICD-10-CM | POA: Insufficient documentation

## 2016-09-10 DIAGNOSIS — F309 Manic episode, unspecified: Secondary | ICD-10-CM | POA: Diagnosis not present

## 2016-09-10 DIAGNOSIS — F17210 Nicotine dependence, cigarettes, uncomplicated: Secondary | ICD-10-CM | POA: Insufficient documentation

## 2016-09-10 DIAGNOSIS — F29 Unspecified psychosis not due to a substance or known physiological condition: Secondary | ICD-10-CM | POA: Diagnosis not present

## 2016-09-10 DIAGNOSIS — F3160 Bipolar disorder, current episode mixed, unspecified: Secondary | ICD-10-CM | POA: Diagnosis not present

## 2016-09-10 DIAGNOSIS — R451 Restlessness and agitation: Secondary | ICD-10-CM

## 2016-09-10 DIAGNOSIS — Z046 Encounter for general psychiatric examination, requested by authority: Secondary | ICD-10-CM | POA: Diagnosis present

## 2016-09-10 HISTORY — DX: Other psychoactive substance abuse, uncomplicated: F19.10

## 2016-09-10 LAB — COMPREHENSIVE METABOLIC PANEL
ALK PHOS: 67 U/L (ref 38–126)
ALT: 84 U/L — AB (ref 14–54)
AST: 51 U/L — AB (ref 15–41)
Albumin: 3.9 g/dL (ref 3.5–5.0)
Anion gap: 4 — ABNORMAL LOW (ref 5–15)
BUN: 10 mg/dL (ref 6–20)
CO2: 26 mmol/L (ref 22–32)
CREATININE: 0.51 mg/dL (ref 0.44–1.00)
Calcium: 8.7 mg/dL — ABNORMAL LOW (ref 8.9–10.3)
Chloride: 108 mmol/L (ref 101–111)
GFR calc Af Amer: >60 mL/min (ref >60)
GFR calc non Af Amer: >60 mL/min (ref >60)
GLUCOSE: 98 mg/dL (ref 65–99)
Potassium: 4 mmol/L (ref 3.5–5.1)
SODIUM: 138 mmol/L (ref 135–145)
Total Bilirubin: 0.8 mg/dL (ref 0.3–1.2)
Total Protein: 6.9 g/dL (ref 6.5–8.1)

## 2016-09-10 LAB — URINALYSIS, COMPLETE (UACMP) WITH MICROSCOPIC
BILIRUBIN URINE: NEGATIVE
Glucose, UA: NEGATIVE mg/dL
Ketones, ur: NEGATIVE mg/dL
Nitrite: NEGATIVE
Protein, ur: NEGATIVE mg/dL
Specific Gravity, Urine: 1.012 (ref 1.005–1.030)
pH: 6 (ref 5.0–8.0)

## 2016-09-10 LAB — URINE DRUG SCREEN, QUALITATIVE (ARMC ONLY)
AMPHETAMINES, UR SCREEN: NOT DETECTED
BENZODIAZEPINE, UR SCRN: NOT DETECTED
Barbiturates, Ur Screen: NOT DETECTED
COCAINE METABOLITE, UR ~~LOC~~: NOT DETECTED
Cannabinoid 50 Ng, Ur ~~LOC~~: POSITIVE — AB
MDMA (Ecstasy)Ur Screen: NOT DETECTED
Methadone Scn, Ur: NOT DETECTED
OPIATE, UR SCREEN: NOT DETECTED
PHENCYCLIDINE (PCP) UR S: NOT DETECTED
Tricyclic, Ur Screen: NOT DETECTED

## 2016-09-10 LAB — CBC WITH DIFFERENTIAL/PLATELET
BASOS PCT: 1 %
Basophils Absolute: 0 10*3/uL (ref 0–0.1)
EOS PCT: 2 %
Eosinophils Absolute: 0.2 10*3/uL (ref 0–0.7)
HCT: 39.6 % (ref 35.0–47.0)
HEMOGLOBIN: 13.5 g/dL (ref 12.0–16.0)
LYMPHS ABS: 2.2 10*3/uL (ref 1.0–3.6)
LYMPHS PCT: 30 %
MCH: 30.8 pg (ref 26.0–34.0)
MCHC: 34 g/dL (ref 32.0–36.0)
MCV: 90.7 fL (ref 80.0–100.0)
Monocytes Absolute: 0.7 10*3/uL (ref 0.2–0.9)
Monocytes Relative: 10 %
Neutro Abs: 4.3 10*3/uL (ref 1.4–6.5)
Neutrophils Relative %: 57 %
Platelets: 184 10*3/uL (ref 150–440)
RBC: 4.37 MIL/uL (ref 3.80–5.20)
RDW: 15.8 % — ABNORMAL HIGH (ref 11.5–14.5)
WBC: 7.4 10*3/uL (ref 3.6–11.0)

## 2016-09-10 LAB — LITHIUM LEVEL

## 2016-09-10 LAB — PREGNANCY, URINE: PREG TEST UR: NEGATIVE

## 2016-09-10 MED ORDER — DIPHENHYDRAMINE HCL 25 MG PO CAPS
ORAL_CAPSULE | ORAL | Status: AC
  Administered 2016-09-10: 50 mg via ORAL
  Filled 2016-09-10: qty 2

## 2016-09-10 MED ORDER — HALOPERIDOL 2 MG PO TABS
2.0000 mg | ORAL_TABLET | Freq: Once | ORAL | Status: AC
  Administered 2016-09-10: 2 mg via ORAL
  Filled 2016-09-10: qty 1

## 2016-09-10 MED ORDER — DIPHENHYDRAMINE HCL 25 MG PO CAPS
50.0000 mg | ORAL_CAPSULE | Freq: Every evening | ORAL | Status: DC | PRN
  Administered 2016-09-10: 50 mg via ORAL

## 2016-09-10 MED ORDER — DIVALPROEX SODIUM 500 MG PO DR TAB
500.0000 mg | DELAYED_RELEASE_TABLET | Freq: Once | ORAL | Status: AC
  Administered 2016-09-10: 500 mg via ORAL
  Filled 2016-09-10: qty 1

## 2016-09-10 MED ORDER — ZIPRASIDONE MESYLATE 20 MG IM SOLR
20.0000 mg | Freq: Once | INTRAMUSCULAR | Status: AC
  Administered 2016-09-10: 20 mg via INTRAMUSCULAR
  Filled 2016-09-10: qty 20

## 2016-09-10 NOTE — ED Notes (Addendum)
Pt sleeping. Food tray placed on head of bed.

## 2016-09-10 NOTE — ED Provider Notes (Signed)
[facility] Emergency Department Provider Note    First MD Initiated Contact with Patient 09/10/16 1228     (approximate)  I have reviewed the triage vital signs and the nursing notes.   HISTORY  Chief Complaint Drug / Alcohol Assessment    HPI Verlyn Dannenberg is a 31 y.o. female with a history of substance abuse anxiety and bipolar 1 disorder presents to the ER with a cat and hand requesting emergent testing of her urine for drug test. Got into an argument with her parents this morning her parents are threatening to take her children away. Patient is very hypervigilant and agitated limiting history and patient is currently non-cooperative with exam. She shakes her head when asked if she has any thoughts of hurting herself or anyone else but her thought process is very disorganized. Does admit to being off of her medications.Denies any substance abuse.   Past Medical History:  Diagnosis Date  . Anxiety   . Bipolar 1 disorder (HCC)   . Depression   . Insomnia   . Substance abuse    History reviewed. No pertinent family history. Past Surgical History:  Procedure Laterality Date  . CESAREAN SECTION    . CESAREAN SECTION     Patient Active Problem List   Diagnosis Date Noted  . Substance induced mood disorder (HCC) 12/07/2015  . Bipolar disorder (HCC) 12/07/2015  . 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 Cocaine abuse 12/07/2015  . Benzodiazepine abuse 12/07/2015      Prior to Admission medications   Medication Sig Start Date End Date Taking? Authorizing Provider  cetirizine (ZYRTEC) 10 MG tablet Take 10 mg by mouth daily as needed. 06/21/15   [provider]  divalproex (DEPAKOTE) 500 MG DR tablet Take 1 tablet (500 mg total) by mouth every 12 (twelve) hours. 12/08/15   Clapacs, Jackquline Denmark, MD  FLUoxetine (PROZAC) 40 MG capsule Take 1 capsule by mouth daily. 01/09/15   [provider]  haloperidol (HALDOL) 2 MG tablet Take 1 tablet (2 mg  total) by mouth 2 (two) times daily. 12/08/15   Clapacs, Jackquline Denmark, MD  haloperidol decanoate (HALDOL DECANOATE) 50 MG/ML injection 1 mL every 30 (thirty) days. 06/23/15   [provider]  hydrOXYzine (ATARAX/VISTARIL) 25 MG tablet Take 1 tablet (25 mg total) by mouth 3 (three) times daily as needed for anxiety. 12/08/15   Clapacs, Jackquline Denmark, MD  hydrOXYzine (VISTARIL) 50 MG capsule Take 1 capsule by mouth every 6 (six) hours as needed. 06/21/15   [provider]  lithium carbonate (LITHOBID) 300 MG CR tablet Take 1 tablet by mouth 2 (two) times daily. 01/09/15   [provider]  omeprazole (PRILOSEC) 20 MG capsule Take 1 capsule (20 mg total) by mouth daily. Patient not taking: Reported on 07/16/2015 06/13/11 06/12/12  Azalia Bilis, MD  oxyCODONE-acetaminophen (ROXICET) 5-325 MG tablet Take 1 tablet by mouth every 6 (six) hours as needed. Patient not taking: Reported on 07/16/2015 02/12/15   Minna Antis, MD  potassium chloride 20 MEQ TBCR Take 20 mEq by mouth daily. 07/18/15 07/25/15  Jennye Moccasin, MD  promethazine (PHENERGAN) 12.5 MG suppository Place 1 suppository (12.5 mg total) rectally every 6 (six) hours as needed for nausea or vomiting. 07/18/15   Jennye Moccasin, MD  promethazine (PHENERGAN) 12.5 MG tablet Take 1 tablet (12.5 mg total) by mouth every 6 (six) hours as needed for nausea or vomiting. 07/18/15   Jennye Moccasin, MD  sulfamethoxazole-trimethoprim (BACTRIM DS,SEPTRA DS)  800-160 MG tablet Take 1 tablet by mouth 2 (two) times daily. 07/26/16   Menshew, Charlesetta Ivory, PA-C  traZODone (DESYREL) 50 MG tablet Take 2 tablets by mouth at bedtime. 01/09/15   [provider]    Allergies Patient has no known allergies.    Social History Social History  Substance Use Topics  . Smoking status: Current Some Day Smoker    Packs/day: 0.50    Types: Cigarettes  . Smokeless tobacco: Never Used  . Alcohol use No    Review of Systems Patient denies  headaches, rhinorrhea, blurry vision, numbness, shortness of breath, chest pain, edema, cough, abdominal pain, nausea, vomiting, diarrhea, dysuria, fevers, rashes or hallucinations unless otherwise stated above in HPI. ____________________________________________   PHYSICAL EXAM:  VITAL SIGNS: Vitals:   09/10/16 1211  BP: 114/65  Pulse: 73  Resp: 18  Temp: 98.6 F (37 C)    Constitutional: Alert and oriented. Eyes: Conjunctivae are normal. PERRL. EOMI. Head: Atraumatic. Nose: No congestion/rhinnorhea. Mouth/Throat: Mucous membranes are moist.  Oropharynx non-erythematous. Neck: No stridor. Painless ROM. No cervical spine tenderness to palpation Hematological/Lymphatic/Immunilogical: No cervical lymphadenopathy. Cardiovascular: Normal rate, regular rhythm. Grossly normal heart sounds.  Good peripheral circulation. Respiratory: Normal respiratory effort.  No retractions. Lungs CTAB. Gastrointestinal: Soft and nontender. No distention. No abdominal bruits. No CVA tenderness. Musculoskeletal: No lower extremity tenderness nor edema.  No joint effusions. Neurologic:  Normal speech and language. No gross focal neurologic deficits are appreciated. No gait instability. Skin:  Skin is warm, dry and intact. No rash noted. Psychiatric: hypervigilant, agitated, disorganized anxious ____________________________________________   LABS (all labs ordered are listed, but only abnormal results are displayed)  Results for orders placed or performed during the hospital encounter of 09/10/16 (from the past 24 hour(s))  Urine Drug Screen, Qualitative ([facility] only)     Status: Abnormal   Collection Time: 09/10/16 12:25 PM  Result Value Ref Range   Tricyclic, Ur Screen NONE DETECTED NONE DETECTED   Amphetamines, Ur Screen NONE DETECTED NONE DETECTED   MDMA (Ecstasy)Ur Screen NONE DETECTED NONE DETECTED   Cocaine Metabolite,Ur [place] NONE DETECTED NONE DETECTED   Opiate, Ur Screen NONE DETECTED NONE  DETECTED   Phencyclidine (PCP) Ur S NONE DETECTED NONE DETECTED   Cannabinoid 50 Ng, Ur [place] POSITIVE (A) NONE DETECTED   Barbiturates, Ur Screen NONE DETECTED NONE DETECTED   Benzodiazepine, Ur Scrn NONE DETECTED NONE DETECTED   Methadone Scn, Ur NONE DETECTED NONE DETECTED  Urinalysis, Complete w Microscopic     Status: Abnormal   Collection Time: 09/10/16 12:25 PM  Result Value Ref Range   Color, Urine YELLOW (A) YELLOW   APPearance HAZY (A) CLEAR   Specific Gravity, Urine 1.012 1.005 - 1.030   pH 6.0 5.0 - 8.0   Glucose, UA NEGATIVE NEGATIVE mg/dL   Hgb urine dipstick SMALL (A) NEGATIVE   Bilirubin Urine NEGATIVE NEGATIVE   Ketones, ur NEGATIVE NEGATIVE mg/dL   Protein, ur NEGATIVE NEGATIVE mg/dL   Nitrite NEGATIVE NEGATIVE   Leukocytes, UA SMALL (A) NEGATIVE   RBC / HPF 0-5 0 - 5 RBC/hpf   WBC, UA 6-30 0 - 5 WBC/hpf   Bacteria, UA RARE (A) NONE SEEN   Squamous Epithelial / LPF 6-30 (A) NONE SEEN   Mucous PRESENT   Pregnancy, urine     Status: None   Collection Time: 09/10/16 12:25 PM  Result Value Ref Range   Preg Test, Ur NEGATIVE NEGATIVE  CBC with Differential/Platelet  Status: Abnormal   Collection Time: 09/10/16  1:13 PM  Result Value Ref Range   WBC 7.4 3.6 - 11.0 K/uL   RBC 4.37 3.80 - 5.20 MIL/uL   Hemoglobin 13.5 12.0 - 16.0 g/dL   HCT 98.139.6 19.135.0 - 47.847.0 %   MCV 90.7 80.0 - 100.0 fL   MCH 30.8 26.0 - 34.0 pg   MCHC 34.0 32.0 - 36.0 g/dL   RDW 29.515.8 (H) 62.111.5 - 30.814.5 %   Platelets 184 150 - 440 K/uL   Neutrophils Relative % 57 %   Neutro Abs 4.3 1.4 - 6.5 K/uL   Lymphocytes Relative 30 %   Lymphs Abs 2.2 1.0 - 3.6 K/uL   Monocytes Relative 10 %   Monocytes Absolute 0.7 0.2 - 0.9 K/uL   Eosinophils Relative 2 %   Eosinophils Absolute 0.2 0 - 0.7 K/uL   Basophils Relative 1 %   Basophils Absolute 0.0 0 - 0.1 K/uL  Comprehensive metabolic panel     Status: Abnormal   Collection Time: 09/10/16  1:13 PM  Result Value Ref Range   Sodium 138 135 - 145 mmol/L    Potassium 4.0 3.5 - 5.1 mmol/L   Chloride 108 101 - 111 mmol/L   CO2 26 22 - 32 mmol/L   Glucose, Bld 98 65 - 99 mg/dL   BUN 10 6 - 20 mg/dL   Creatinine, Ser 6.570.51 0.44 - 1.00 mg/dL   Calcium 8.7 (L) 8.9 - 10.3 mg/dL   Total Protein 6.9 6.5 - 8.1 g/dL   Albumin 3.9 3.5 - 5.0 g/dL   AST 51 (H) 15 - 41 U/L   ALT 84 (H) 14 - 54 U/L   Alkaline Phosphatase 67 38 - 126 U/L   Total Bilirubin 0.8 0.3 - 1.2 mg/dL   GFR calc non Af Amer >60 >60 mL/min   GFR calc Af Amer >60 >60 mL/min   Anion gap 4 (L) 5 - 15  Lithium level     Status: Abnormal   Collection Time: 09/10/16  1:13 PM  Result Value Ref Range   Lithium Lvl <0.06 (L) 0.60 - 1.20 mmol/L   ____________________________________________    ____________________________________________   PROCEDURES  Procedure(s) performed:  Procedures    Critical Care performed: ____________________________________________   INITIAL IMPRESSION / ASSESSMENT AND PLAN / ED COURSE  Pertinent labs & imaging results that were available during my care of the patient were reviewed by me and considered in my medical decision making (see chart for details).  DDX: Psychosis, delirium, medication effect, noncompliance, polysubstance abuse, Si, Hi, depression   Glenna DurandJennifer Minish is a 31 y.o. who presents to the ED with for evaluation of agitation abnormal behavior.  Patient has psych history of bipolar disorder and has been non-compliant.  Laboratory testing was ordered to evaluation for underlying electrolyte derangement or signs of underlying organic pathology to explain today's presentation.  Based on history and physical and laboratory evaluation, it appears that the patient's presentation is 2/2 underlying psychiatric disorder and will require further evaluation and management by inpatient psychiatry.  Patient was made an IVC due to disorganized thought processes and behavior.  Disposition pending psychiatric evaluation.        ____________________________________________   FINAL CLINICAL IMPRESSION(S) / ED DIAGNOSES  Final diagnoses:  Mania (HCC)  Agitation  Psychosis, unspecified psychosis type      NEW MEDICATIONS STARTED DURING THIS VISIT:  New Prescriptions   No medications on file     Note:  This  document was prepared using Conservation officer, historic buildings and may include unintentional dictation errors.    Willy Eddy, MD 09/10/16 [phone]

## 2016-09-10 NOTE — ED Notes (Signed)
Attempted to give pt dinner tray - pt did not want - sleeping

## 2016-09-10 NOTE — ED Notes (Signed)
Pt came in requesting drug test to prove to her mother she wasn't doing drugs so her mother wouldn't have her child taken away. Pt manic, not taking her psych meds, labile, only wants men to take care of her or talk to her. Trying to manipulate staff. Pt at first refused to take oral meds, but when given choice of shot or pill, chose pill.

## 2016-09-10 NOTE — ED Notes (Signed)
Report from susan, rn. Pt sleeping, resps unlabored.

## 2016-09-10 NOTE — ED Notes (Addendum)
Pt belongings consisted of flip flops, shirt, bra, pants, cell phone and a bill in her back pocket. Unsure what total of bill was. Multiple stretch bracelets and two woven necklaces. Two lighters were with pt belongings.

## 2016-09-10 NOTE — ED Notes (Signed)
Pt requesting lunch tray - given

## 2016-09-10 NOTE — ED Triage Notes (Signed)
Pt with home issues here for a drug test. States that if she is positive her mom will take her kids away. Pt with defensive attitude and tearful in triage.

## 2016-09-10 NOTE — BH Assessment (Signed)
Per, Beverly SessionsJagannath Subedi, MD - patient meets criteria for inpatient hospitalization.  TTS referred patient to the following facilities: [place]  [facility]  [place]  [place]  [facility]  [facility]  [place]
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
## 2016-09-10 NOTE — ED Notes (Signed)
Patient is IVC and is pending placement. 

## 2016-09-10 NOTE — BH Assessment (Addendum)
Tele Assessment Note   Janice DurandJennifer Walls is a 31 year old female.   Patient was manic during the assessment.  Patient reports that she is not compliant with taking her psychiatric medication because of the way the medication makes her feel.  Per documentation in the epic chart, patient only wants men to take care of her or talk to her.  Patient reports increased depression and anxiety because if she tests positive for drugs then her mom will take her kids away.  Patient currently denies using cocaine.  Patient reports that she has been clean and she is able to care for her child.  Patient reports prior inpatient psychiatric hospitalization due to SI two years ago.    Per documentation on the IVC, patient is agitation, paranoia and displaying disorganized behavior.  Patient denies SI/HI/Psychosis/Substance Abuse.   Per Beverly SessionsJagannath Subedi, MD - patient meets criteria for inpatient hospitalization.    Diagnosis: Bipolar 1 Disorder   Past Medical History:  Past Medical History:  Diagnosis Date  . Anxiety   . Bipolar 1 disorder ([facility])   . Depression   . Insomnia   . Substance abuse     Past Surgical History:  Procedure Laterality Date  . CESAREAN SECTION    . CESAREAN SECTION      Family History: History reviewed. No pertinent family history.  Social History:  reports that she has been smoking Cigarettes.  She has been smoking about 0.50 packs per day. She has never used smokeless tobacco. She reports that she uses drugs, including Marijuana. She reports that she does not drink alcohol.  Additional Social History:  Alcohol / Drug Use History of alcohol / drug use?: No history of alcohol / drug abuse  CIWA: CIWA-Ar BP: 114/65 Pulse Rate: 73 COWS:    PATIENT STRENGTHS: (choose at least two) Capable of independent living Communication skills Physical Health  Allergies: No Known Allergies  Home Medications:  (Not in a hospital admission)  OB/GYN Status:  Patient's last menstrual  period was 08/27/2016.  General Assessment Data [facility] Assessment: In system Is this a Tele or Face-to-Face Assessment?: Tele Assessment Is this an Initial Assessment or a Re-assessment for this encounter?: Initial Assessment Marital status: Single Maiden name: NA Is patient pregnant?: No Pregnancy Status: No Living Arrangements: Alone Can pt return to current living arrangement?: Yes Admission Status: Involuntary Is patient capable of signing voluntary admission?: No Referral Source: Self/Family/Friend Insurance type: [employer]  Medical Screening Exam [facility]-in ONLY) Medical Exam completed:  (NA)  Crisis Care Plan Living Arrangements: Alone Legal Guardian:  (NA) Name of Psychiatrist: Clinic in [place]  Name of Therapist: None Reported  Education Status Is patient currently in school?: No Current Grade: NA Highest grade of school patient has completed: NA Name of school: NA Contact person: NA  Risk to self with the past 6 months Suicidal Ideation: Yes-Currently Present Has patient been a risk to self within the past 6 months prior to admission? : No Suicidal Intent: Yes-Currently Present Has patient had any suicidal intent within the past 6 months prior to admission? : No Is patient at risk for suicide?: No Suicidal Plan?: No Has patient had any suicidal plan within the past 6 months prior to admission? : No Access to Means: No What has been your use of drugs/alcohol within the last 12 months?: None Reported Previous Attempts/Gestures: Yes How many times?: 1 Other Self Harm Risks: None Reported Triggers for Past Attempts: Unpredictable Intentional Self Injurious Behavior:  None Family Suicide History: No Recent stressful life event(s): Conflict (Comment), Job Loss, Financial Problems (Strained relationship with her mother ) Persecutory voices/beliefs?: No Depression: Yes Depression Symptoms: Despondent, Loss of interest in usual  pleasures, Feeling angry/irritable Substance abuse history and/or treatment for substance abuse?: No Suicide prevention information given to non-admitted patients: Not applicable  Risk to Others within the past 6 months Homicidal Ideation: No Does patient have any lifetime risk of violence toward others beyond the six months prior to admission? : No Thoughts of Harm to Others: No Current Homicidal Intent: No Current Homicidal Plan: No Access to Homicidal Means: No Identified Victim: NA History of harm to others?: No Assessment of Violence: None Noted Violent Behavior Description: None Reported Does patient have access to weapons?: No Criminal Charges Pending?: No Does patient have a court date: No Is patient on probation?: No  Psychosis Hallucinations: None noted Delusions: None noted  Mental Status Report Appearance/Hygiene: In scrubs Eye Contact: Fair Motor Activity: Freedom of movement, Restlessness, Hyperactivity Speech: Pressured Level of Consciousness: Alert Mood: Anxious, Suspicious Affect: Anxious Anxiety Level: Minimal Thought Processes: Flight of Ideas Judgement: Impaired Orientation: Person, Place, Time, Situation Obsessive Compulsive Thoughts/Behaviors: None  Cognitive Functioning Concentration: Decreased Memory: Recent Intact, Remote Intact IQ: Average Insight: Poor Impulse Control: Fair Appetite: Fair Weight Loss: 0 Weight Gain: 0 Sleep: No Change Total Hours of Sleep: 8 Vegetative Symptoms: None  ADLScreening [facility] Assessment Services) Patient's cognitive ability adequate to safely complete daily activities?: Yes Patient able to express need for assistance with ADLs?: Yes Independently performs ADLs?: Yes (appropriate for developmental age)  Prior Inpatient Therapy Prior Inpatient Therapy: Yes Prior Therapy Dates: Unable to remember the dates  Prior Therapy Facilty/Provider(s): Unable to remember the name of the facility  Reason for Treatment:  SI  Prior Outpatient Therapy Prior Outpatient Therapy: Yes Prior Therapy Dates: Ongoing  Prior Therapy Facilty/Provider(s): Clinic in [place]  Reason for Treatment: Medication Management  Does patient have an ACCT team?: No Does patient have Intensive In-House Services?  : No Does patient have [facility] services? : No Does patient have P4CC services?: No  ADL Screening (condition at time of admission) Patient's cognitive ability adequate to safely complete daily activities?: Yes Is the patient deaf or have difficulty hearing?: No Does the patient have difficulty seeing, even when wearing glasses/contacts?: No Does the patient have difficulty concentrating, remembering, or making decisions?: No Patient able to express need for assistance with ADLs?: Yes Does the patient have difficulty dressing or bathing?: No Independently performs ADLs?: Yes (appropriate for developmental age) Does the patient have difficulty walking or climbing stairs?: No Weakness of Legs: None Weakness of Arms/Hands: None  Home Assistive Devices/Equipment Home Assistive Devices/Equipment: None    Abuse/Neglect Assessment (Assessment to be complete while patient is alone) Physical Abuse: Denies Verbal Abuse: Denies Sexual Abuse: Denies Exploitation of patient/patient's resources: Denies Self-Neglect: Denies Values / Beliefs Cultural Requests During Hospitalization: None Spiritual Requests During Hospitalization: None Consults Spiritual Care Consult Needed: No Social Work Consult Needed: No Merchant navy officer (For Healthcare) Does Patient Have a Medical Advance Directive?: No Would patient like information on creating a medical advance directive?: No - Patient declined    Additional Information 1:1 In Past 12 Months?: No CIRT Risk: No Elopement Risk: No Does patient have medical clearance?: Yes     Disposition: er Beverly Sessions, MD - patient meets criteria for inpatient  hospitalization. Disposition Initial Assessment Completed for this Encounter: Yes Disposition of Patient: Inpatient treatment program  Elisabeth Most, Virgel Manifold  [place] 09/10/2016 3:21 PM

## 2016-09-10 NOTE — ED Notes (Addendum)
Pt given graham crackers, peanut butter and ginger ale earlier. Pt stated that is all she wanted because she wasn't eating this hospital food. Pt refused to change clothes without a female present and did not want females caring for her. Pt agreed to let this tech be in bathroom with pt and female RN Electrical engineer(Butch) while she changed. Pt agreed to let this tech draw her labs and is pleasant with this tech at this time.  Pt is currently in room 23 awaiting TTS. Box given to [employer] officer for transport of pt kitten. SW in room with pt at this time.

## 2016-09-11 DIAGNOSIS — F3160 Bipolar disorder, current episode mixed, unspecified: Secondary | ICD-10-CM | POA: Diagnosis not present

## 2016-09-11 MED ORDER — ZIPRASIDONE HCL 20 MG PO CAPS
20.0000 mg | ORAL_CAPSULE | Freq: Once | ORAL | Status: DC
  Filled 2016-09-11: qty 1

## 2016-09-11 MED ORDER — DIVALPROEX SODIUM 500 MG PO DR TAB
500.0000 mg | DELAYED_RELEASE_TABLET | Freq: Two times a day (BID) | ORAL | Status: DC
  Administered 2016-09-12: 500 mg via ORAL
  Filled 2016-09-11: qty 1

## 2016-09-11 MED ORDER — SULFAMETHOXAZOLE-TRIMETHOPRIM 800-160 MG PO TABS
1.0000 | ORAL_TABLET | Freq: Two times a day (BID) | ORAL | Status: DC
  Administered 2016-09-12: 1 via ORAL
  Filled 2016-09-11: qty 1

## 2016-09-11 MED ORDER — HALOPERIDOL 2 MG PO TABS
2.0000 mg | ORAL_TABLET | Freq: Two times a day (BID) | ORAL | Status: DC
  Administered 2016-09-12: 2 mg via ORAL
  Filled 2016-09-11 (×2): qty 1

## 2016-09-11 NOTE — ED Provider Notes (Signed)
-----------------------------------------   6:48 AM on 09/11/2016 -----------------------------------------   Blood pressure 126/88, pulse 96, temperature 98.9 F (37.2 C), temperature source Oral, resp. rate 18, height 5\' 1"  (1.549 m), weight 125 lb (56.7 kg), last menstrual period 08/27/2016, SpO2 99 %.  The patient had no acute events since last update.  Calm and cooperative at this time.  The patient has been recommended for inpatient. The patient has been starting to act ups I will give her some geodon.      Janice ApleyWebster, Janice Baumgardner P, MD 09/11/16 [phone]

## 2016-09-11 NOTE — Consult Note (Signed)
[facility] Consult   Reason for Consult:  Consult to follow-up on 31 year old woman with a history of bipolar disorder and substance abuse Referring Physician:  McShane Patient Identification: Janice Walls MRN:  [id number] Principal Diagnosis: Bipolar disorder, mixed ([place]) Diagnosis:   Patient Active Problem List   Diagnosis Date Noted  . Bipolar disorder, mixed ([place]) [F31.60] 09/11/2016  . Cannabis abuse [F12.10] 09/11/2016  . Noncompliance [Z91.19] 09/11/2016  . Substance induced mood disorder ([place]) [F19.94] 12/07/2015  . Bipolar disorder with severe mania ([place]) [F31.13] 12/07/2015  . Amphetamine abuse [F15.10] 12/07/2015  . Cocaine abuse [F14.10] 12/07/2015  . Benzodiazepine abuse [F13.10] 12/07/2015    Total Time spent with patient: 30 minutes  Subjective:   Janice Walls is a 31 y.o. female patient admitted with "I just came in for a drug test".  HPI:  Patient interviewed chart reviewed. 31 year old woman with a history of bipolar disorder and substance abuse came to the emergency room requesting a drug test. Became agitated and disorganized and bizarre in her behavior. Has been aggressive hostile and threatening at times. Placed under commitment. Patient admits that she had been off her psychiatric medicine for quite a while and even say how long it is probably since her last hospitalization. She denies suicidal or homicidal thoughts. Denies that she's been abusing drugs recently. Talks a lot about how angry she is at her parents especially her mother. Rambling disorganized speech.  Social history: Evidently has been living at least part of the time probably much of the time with her family. Her children or at least one of them seems to be back in her care.  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  Substance abuse history: Extensive history of drug abuse with a lot of benzodiazepine as well as  other drug abuse in the past with multiple hospitalizations.  Past Psychiatric History: Patient has a history of labile bizarre agitated behavior and a diagnosis of bipolar disorder. Has previously been treated with either lithium or Depakote with some positive response but has a history of noncompliance.  Risk to Self: Suicidal Ideation: Yes-Currently Present Suicidal Intent: Yes-Currently Present Is patient at risk for suicide?: No Suicidal Plan?: No Access to Means: No What has been your use of drugs/alcohol within the last 12 months?: None Reported How many times?: 1 Other Self Harm Risks: None Reported Triggers for Past Attempts: Unpredictable Intentional Self Injurious Behavior: None Risk to Others: Homicidal Ideation: No Thoughts of Harm to Others: No Current Homicidal Intent: No Current Homicidal Plan: No Access to Homicidal Means: No Identified Victim: NA History of harm to others?: No Assessment of Violence: None Noted Violent Behavior Description: None Reported Does patient have access to weapons?: No Criminal Charges Pending?: No Does patient have a court date: No Prior Inpatient Therapy: Prior Inpatient Therapy: Yes Prior Therapy Dates: Unable to remember the dates  Prior Therapy Facilty/Provider(s): Unable to remember the name of the facility  Reason for Treatment: SI Prior Outpatient Therapy: Prior Outpatient Therapy: Yes Prior Therapy Dates: Ongoing  Prior Therapy Facilty/Provider(s): Clinic in [place]  Reason for Treatment: Medication Management  Does patient have an ACCT team?: No Does patient have Intensive In-House Services?  : No Does patient have [facility] services? : No Does patient have P4CC services?: No  Past Medical History:  Past Medical History:  Diagnosis Date  . Anxiety   . Bipolar 1 disorder ([place])   . Depression   .  Insomnia   . Substance abuse     Past Surgical History:  Procedure Laterality Date  . CESAREAN SECTION    . CESAREAN  SECTION     Family History: History reviewed. No pertinent family history. Family Psychiatric  History: Denies Social History:  History  Alcohol Use No     History  Drug Use  . Types: Marijuana    Social History   Social History  . Marital status: Single    Spouse name: N/A  . Number of children: N/A  . Years of education: N/A   Social History Main Topics  . Smoking status: Current Some Day Smoker    Packs/day: 0.50    Types: Cigarettes  . Smokeless tobacco: Never Used  . Alcohol use No  . Drug use: Yes    Types: Marijuana  . Sexual activity: Yes    Birth control/ protection: None   Other Topics Concern  . None   Social History Narrative  . None   Additional Social History:    Allergies:  No Known Allergies  Labs:  Results for orders placed or performed during the hospital encounter of 09/10/16 (from the past 48 hour(s))  Urine Drug Screen, Qualitative ([place] only)     Status: Abnormal   Collection Time: 09/10/16 12:25 PM  Result Value Ref Range   Tricyclic, Ur Screen NONE DETECTED NONE DETECTED   Amphetamines, Ur Screen NONE DETECTED NONE DETECTED   MDMA (Ecstasy)Ur Screen NONE DETECTED NONE DETECTED   Cocaine Metabolite,Ur [place] NONE DETECTED NONE DETECTED   Opiate, Ur Screen NONE DETECTED NONE DETECTED   Phencyclidine (PCP) Ur S NONE DETECTED NONE DETECTED   Cannabinoid 50 Ng, Ur  POSITIVE (A) NONE DETECTED   Barbiturates, Ur Screen NONE DETECTED NONE DETECTED   Benzodiazepine, Ur Scrn NONE DETECTED NONE DETECTED   Methadone Scn, Ur NONE DETECTED NONE DETECTED    Comment: (NOTE) 801  Tricyclics, urine               Cutoff 1000 ng/mL 200  Amphetamines, urine             Cutoff 1000 ng/mL 300  MDMA (Ecstasy), urine           Cutoff 500 ng/mL 400  Cocaine Metabolite, urine       Cutoff 300 ng/mL 500  Opiate, urine                   Cutoff 300 ng/mL 600  Phencyclidine (PCP), urine      Cutoff 25 ng/mL 700  Cannabinoid, urine              Cutoff 50  ng/mL 800  Barbiturates, urine             Cutoff 200 ng/mL 900  Benzodiazepine, urine           Cutoff 200 ng/mL 1000 Methadone, urine                Cutoff 300 ng/mL 1100 1200 The urine drug screen provides only a preliminary, unconfirmed 1300 analytical test result and should not be used for non-medical 1400 purposes. Clinical consideration and professional judgment should 1500 be applied to any positive drug screen result due to possible 1600 interfering substances. A more specific alternate chemical method 1700 must be used in order to obtain a confirmed analytical result.  1800 Gas chromato graphy / mass spectrometry (GC/MS) is the preferred 1900 confirmatory method.   Urinalysis, Complete w Microscopic  Status: Abnormal   Collection Time: 09/10/16 12:25 PM  Result Value Ref Range   Color, Urine YELLOW (A) YELLOW   APPearance HAZY (A) CLEAR   Specific Gravity, Urine 1.012 1.005 - 1.030   pH 6.0 5.0 - 8.0   Glucose, UA NEGATIVE NEGATIVE mg/dL   Hgb urine dipstick SMALL (A) NEGATIVE   Bilirubin Urine NEGATIVE NEGATIVE   Ketones, ur NEGATIVE NEGATIVE mg/dL   Protein, ur NEGATIVE NEGATIVE mg/dL   Nitrite NEGATIVE NEGATIVE   Leukocytes, UA SMALL (A) NEGATIVE   RBC / HPF 0-5 0 - 5 RBC/hpf   WBC, UA 6-30 0 - 5 WBC/hpf   Bacteria, UA RARE (A) NONE SEEN   Squamous Epithelial / LPF 6-30 (A) NONE SEEN   Mucous PRESENT   Pregnancy, urine     Status: None   Collection Time: 09/10/16 12:25 PM  Result Value Ref Range   Preg Test, Ur NEGATIVE NEGATIVE  CBC with Differential/Platelet     Status: Abnormal   Collection Time: 09/10/16  1:13 PM  Result Value Ref Range   WBC 7.4 3.6 - 11.0 K/uL   RBC 4.37 3.80 - 5.20 MIL/uL   Hemoglobin 13.5 12.0 - 16.0 g/dL   HCT 39.6 35.0 - 47.0 %   MCV 90.7 80.0 - 100.0 fL   MCH 30.8 26.0 - 34.0 pg   MCHC 34.0 32.0 - 36.0 g/dL   RDW 15.8 (H) 11.5 - 14.5 %   Platelets 184 150 - 440 K/uL   Neutrophils Relative % 57 %   Neutro Abs 4.3 1.4 - 6.5  K/uL   Lymphocytes Relative 30 %   Lymphs Abs 2.2 1.0 - 3.6 K/uL   Monocytes Relative 10 %   Monocytes Absolute 0.7 0.2 - 0.9 K/uL   Eosinophils Relative 2 %   Eosinophils Absolute 0.2 0 - 0.7 K/uL   Basophils Relative 1 %   Basophils Absolute 0.0 0 - 0.1 K/uL  Comprehensive metabolic panel     Status: Abnormal   Collection Time: 09/10/16  1:13 PM  Result Value Ref Range   Sodium 138 135 - 145 mmol/L   Potassium 4.0 3.5 - 5.1 mmol/L   Chloride 108 101 - 111 mmol/L   CO2 26 22 - 32 mmol/L   Glucose, Bld 98 65 - 99 mg/dL   BUN 10 6 - 20 mg/dL   Creatinine, Ser 0.51 0.44 - 1.00 mg/dL   Calcium 8.7 (L) 8.9 - 10.3 mg/dL   Total Protein 6.9 6.5 - 8.1 g/dL   Albumin 3.9 3.5 - 5.0 g/dL   AST 51 (H) 15 - 41 U/L   ALT 84 (H) 14 - 54 U/L   Alkaline Phosphatase 67 38 - 126 U/L   Total Bilirubin 0.8 0.3 - 1.2 mg/dL   GFR calc non Af Amer >60 >60 mL/min   GFR calc Af Amer >60 >60 mL/min    Comment: (NOTE) The eGFR has been calculated using the CKD EPI equation. This calculation has not been validated in all clinical situations. eGFR's persistently <60 mL/min signify possible Chronic Kidney Disease.    Anion gap 4 (L) 5 - 15  Lithium level     Status: Abnormal   Collection Time: 09/10/16  1:13 PM  Result Value Ref Range   Lithium Lvl <0.06 (L) 0.60 - 1.20 mmol/L    Current Facility-Administered Medications  Medication Dose Route Frequency Provider Last Rate Last Dose  . diphenhydrAMINE (BENADRYL) capsule 50 mg  50 mg Oral QHS PRN  Orbie Pyo, MD   50 mg at 09/10/16 2148  . divalproex (DEPAKOTE) DR tablet 500 mg  500 mg Oral Q12H Clapacs, John T, MD      . haloperidol (HALDOL) tablet 2 mg  2 mg Oral BID Clapacs, John T, MD      . sulfamethoxazole-trimethoprim (BACTRIM DS,SEPTRA DS) 800-160 MG per tablet 1 tablet  1 tablet Oral Q12H Clapacs, Madie Reno, MD       Current Outpatient Prescriptions  Medication Sig Dispense Refill  . divalproex (DEPAKOTE) 500 MG DR tablet Take 1  tablet (500 mg total) by mouth every 12 (twelve) hours. (Patient not taking: Reported on 09/11/2016) 90 tablet 1  . haloperidol (HALDOL) 2 MG tablet Take 1 tablet (2 mg total) by mouth 2 (two) times daily. (Patient not taking: Reported on 09/11/2016) 60 tablet 1  . hydrOXYzine (ATARAX/VISTARIL) 25 MG tablet Take 1 tablet (25 mg total) by mouth 3 (three) times daily as needed for anxiety. (Patient not taking: Reported on 09/11/2016) 30 tablet 1  . omeprazole (PRILOSEC) 20 MG capsule Take 1 capsule (20 mg total) by mouth daily. (Patient not taking: Reported on 07/16/2015) 30 capsule 0  . oxyCODONE-acetaminophen (ROXICET) 5-325 MG tablet Take 1 tablet by mouth every 6 (six) hours as needed. (Patient not taking: Reported on 07/16/2015) 20 tablet 0  . potassium chloride 20 MEQ TBCR Take 20 mEq by mouth daily. (Patient not taking: Reported on 09/11/2016) 7 tablet 0  . promethazine (PHENERGAN) 12.5 MG suppository Place 1 suppository (12.5 mg total) rectally every 6 (six) hours as needed for nausea or vomiting. (Patient not taking: Reported on 09/11/2016) 12 each 0  . promethazine (PHENERGAN) 12.5 MG tablet Take 1 tablet (12.5 mg total) by mouth every 6 (six) hours as needed for nausea or vomiting. (Patient not taking: Reported on 09/11/2016) 30 tablet 0  . sulfamethoxazole-trimethoprim (BACTRIM DS,SEPTRA DS) 800-160 MG tablet Take 1 tablet by mouth 2 (two) times daily. (Patient not taking: Reported on 09/11/2016) 20 tablet 0    Musculoskeletal: Strength & Muscle Tone: within normal limits Gait & Station: normal Patient leans: N/A  Psychiatric Specialty Exam: Physical Exam  Nursing note and vitals reviewed. Constitutional: She appears well-developed and well-nourished.  HENT:  Head: Normocephalic and atraumatic.  Eyes: Conjunctivae are normal. Pupils are equal, round, and reactive to light.  Neck: Normal range of motion.  Cardiovascular: Regular rhythm and normal heart sounds.   Respiratory: Effort normal.  No respiratory distress.  GI: Soft.  Musculoskeletal: Normal range of motion.  Neurological: She is alert.  Skin: Skin is warm and dry.  Psychiatric: Her affect is labile. Her speech is tangential. She is agitated. Thought content is paranoid. She expresses impulsivity. She expresses no homicidal and no suicidal ideation. She exhibits abnormal recent memory.    Review of Systems  Constitutional: Negative.   HENT: Negative.   Eyes: Negative.   Respiratory: Negative.   Cardiovascular: Negative.   Gastrointestinal: Negative.   Musculoskeletal: Negative.   Skin: Negative.   Neurological: Negative.   Psychiatric/Behavioral: Negative for depression, hallucinations, memory loss, substance abuse and suicidal ideas. The patient is nervous/anxious. The patient does not have insomnia.     Blood pressure 108/68, pulse 65, temperature 98.7 F (37.1 C), temperature source Oral, resp. rate 18, height '5\' 1"'  (1.549 m), weight 56.7 kg (125 lb), last menstrual period 08/27/2016, SpO2 98 %.Body mass index is 23.62 kg/m.  General Appearance: Casual  Eye Contact:  Fair  Speech:  Pressured  Volume:  Increased  Mood:  Euphoric  Affect:  Inappropriate and Labile  Thought Process:  Disorganized  Orientation:  Full (Time, Place, and Person)  Thought Content:  Illogical, Paranoid Ideation, Rumination and Tangential  Suicidal Thoughts:  No  Homicidal Thoughts:  No  Memory:  Immediate;   Fair Recent;   Poor Remote;   Fair  Judgement:  Impaired  Insight:  Lacking  Psychomotor Activity:  Decreased  Concentration:  Concentration: Fair  Recall:  [employer] of Knowledge:  Fair  Language:  Fair  Akathisia:  No  Handed:  Right  AIMS (if indicated):     Assets:  Desire for Improvement Financial Resources/Insurance Housing Social Support  ADL's:  Intact  Cognition:  Impaired,  Mild  Sleep:        Treatment Plan Summary: Daily contact with patient to assess and evaluate symptoms and progress in  treatment, Medication management and Plan Continue involuntary commitment and plans for admission to the psychiatric ward. Restarted Depakote and Haldol as previously used. Case reviewed with TTS. Orders completed.  Disposition: Recommend psychiatric Inpatient admission when medically cleared. Supportive therapy provided about ongoing stressors.  Alethia Berthold, MD 09/11/2016 6:12 PM

## 2016-09-11 NOTE — ED Provider Notes (Signed)
-----------------------------------------   7:40 PM on 09/11/2016 -----------------------------------------  Normal sinus rhythm at 72 bpm no acute ST elevation or acute ST depression normal axis unremarkable EKG. Ordered by psychiatry.   Jeanmarie PlantMcShane, Madelin Weseman A, MD 09/11/16 [phone]

## 2016-09-11 NOTE — BH Assessment (Addendum)
[facility] Toni Amendlapacs.  Attending Physician will be Dr. Ardyth HarpsHernandez.   Patient has [facility] GreenbriarPhyllis.   ER staff is aware of the admission (Dr. Alphonzo LemmingsMcShane, ER MD & My[place], Patient Access).

## 2016-09-11 NOTE — ED Notes (Signed)
Pt was using the phone and handed it to me to talk to mom. Mom said that pt has been off her meds for years. That pt comes here and gets on meds, she improves enough to be discharged and then does not get med prescription filled.

## 2016-09-11 NOTE — ED Notes (Signed)
Pt up and agitated, states "I want something to eat, I want something for my nerves." pt then states "never fucking mind, you can keep your drugs." md notified of pt's agitation.

## 2016-09-11 NOTE — ED Notes (Signed)
Report to bill, rn.  

## 2016-09-11 NOTE — ED Notes (Addendum)
During moms visit, pt became increasingly agitated and verbally aggressive to mom and staff. Visitation terminated. Pt at door screaming and yelling at staff. Pt tried to run up hall and out of area. Pt assisted back to room by staff and ODS.  Pt in doorway screaming she will just kill herself. MD at bedside.

## 2016-09-12 ENCOUNTER — Inpatient Hospital Stay
Admission: RE | Admit: 2016-09-12 | Discharge: 2016-09-18 | DRG: 885 | Disposition: A | Discharge status: Home / Self Care | Payer: Medicaid Other | Source: Intra-hospital | Attending: Psychiatry | Admitting: Psychiatry

## 2016-09-12 DIAGNOSIS — Z9119 Patient's noncompliance with other medical treatment and regimen: Secondary | ICD-10-CM

## 2016-09-12 DIAGNOSIS — R740 Nonspecific elevation of levels of transaminase and lactic acid dehydrogenase [LDH]: Secondary | ICD-10-CM

## 2016-09-12 DIAGNOSIS — F3112 Bipolar disorder, current episode manic without psychotic features, moderate: Secondary | ICD-10-CM

## 2016-09-12 DIAGNOSIS — F1220 Cannabis dependence, uncomplicated: Secondary | ICD-10-CM | POA: Diagnosis not present

## 2016-09-12 DIAGNOSIS — F319 Bipolar disorder, unspecified: Principal | ICD-10-CM | POA: Diagnosis present

## 2016-09-12 DIAGNOSIS — F309 Manic episode, unspecified: Secondary | ICD-10-CM | POA: Diagnosis not present

## 2016-09-12 DIAGNOSIS — B1920 Unspecified viral hepatitis C without hepatic coma: Secondary | ICD-10-CM | POA: Diagnosis present

## 2016-09-12 DIAGNOSIS — R7401 Elevation of levels of liver transaminase levels: Secondary | ICD-10-CM

## 2016-09-12 DIAGNOSIS — G4700 Insomnia, unspecified: Secondary | ICD-10-CM | POA: Diagnosis present

## 2016-09-12 DIAGNOSIS — F17210 Nicotine dependence, cigarettes, uncomplicated: Secondary | ICD-10-CM | POA: Diagnosis present

## 2016-09-12 DIAGNOSIS — F3110 Bipolar disorder, current episode manic without psychotic features, unspecified: Secondary | ICD-10-CM

## 2016-09-12 DIAGNOSIS — F17200 Nicotine dependence, unspecified, uncomplicated: Secondary | ICD-10-CM

## 2016-09-12 LAB — LIPID PANEL
CHOL/HDL RATIO: 3.2 ratio
CHOLESTEROL: 130 mg/dL (ref 0–200)
HDL: 41 mg/dL (ref >40)
LDL Cholesterol: 73 mg/dL (ref 0–99)
Triglycerides: 79 mg/dL (ref <150)
VLDL: 16 mg/dL (ref 0–40)

## 2016-09-12 LAB — TSH: TSH: 4.463 u[IU]/mL (ref 0.350–4.500)

## 2016-09-12 MED ORDER — SULFAMETHOXAZOLE-TRIMETHOPRIM 800-160 MG PO TABS
1.0000 | ORAL_TABLET | Freq: Two times a day (BID) | ORAL | Status: DC
  Administered 2016-09-12: 1 via ORAL
  Filled 2016-09-12: qty 1

## 2016-09-12 MED ORDER — HYDROXYZINE HCL 25 MG PO TABS: 25.0000 mg | ORAL_TABLET | Freq: Three times a day (TID) | ORAL | Status: DC | PRN

## 2016-09-12 MED ORDER — MAGNESIUM HYDROXIDE 400 MG/5ML PO SUSP: 30.0000 mL | Freq: Every day | ORAL | Status: DC | PRN

## 2016-09-12 MED ORDER — HALOPERIDOL 0.5 MG PO TABS
2.0000 mg | ORAL_TABLET | Freq: Two times a day (BID) | ORAL | Status: DC
  Administered 2016-09-12: 2 mg via ORAL
  Filled 2016-09-12: qty 4

## 2016-09-12 MED ORDER — ALUM & MAG HYDROXIDE-SIMETH 200-200-20 MG/5ML PO SUSP: 30.0000 mL | ORAL | Status: DC | PRN

## 2016-09-12 MED ORDER — DIVALPROEX SODIUM 500 MG PO DR TAB
500.0000 mg | DELAYED_RELEASE_TABLET | Freq: Two times a day (BID) | ORAL | Status: DC
  Administered 2016-09-12: 500 mg via ORAL
  Filled 2016-09-12: qty 1

## 2016-09-12 MED ORDER — ARIPIPRAZOLE 10 MG PO TABS
15.0000 mg | ORAL_TABLET | Freq: Every day | ORAL | Status: DC
  Administered 2016-09-12: 15 mg via ORAL
  Filled 2016-09-12: qty 1

## 2016-09-12 MED ORDER — ACETAMINOPHEN 325 MG PO TABS: 650.0000 mg | ORAL_TABLET | Freq: Four times a day (QID) | ORAL | Status: DC | PRN

## 2016-09-12 MED ORDER — EYE WASH OPHTH SOLN
2.0000 [drp] | OPHTHALMIC | Status: DC | PRN
  Filled 2016-09-12: qty 118

## 2016-09-12 MED ORDER — DIPHENHYDRAMINE HCL 25 MG PO CAPS
50.0000 mg | ORAL_CAPSULE | Freq: Every evening | ORAL | Status: DC | PRN
  Administered 2016-09-12: 50 mg via ORAL
  Filled 2016-09-12: qty 2

## 2016-09-12 MED ORDER — LITHIUM CARBONATE ER 450 MG PO TBCR
450.0000 mg | EXTENDED_RELEASE_TABLET | Freq: Two times a day (BID) | ORAL | Status: DC
  Administered 2016-09-12 – 2016-09-14 (×4): 450 mg via ORAL
  Filled 2016-09-12 (×4): qty 1

## 2016-09-12 MED ORDER — LORAZEPAM 2 MG PO TABS
2.0000 mg | ORAL_TABLET | Freq: Every day | ORAL | Status: DC
  Administered 2016-09-13 – 2016-09-17 (×5): 2 mg via ORAL
  Filled 2016-09-12 (×5): qty 1

## 2016-09-12 NOTE — [facility] Group Notes (Signed)
[facility] LCSW Group Therapy Note  Date/Time: 09/12/16, 1500  Type of Therapy/Topic:  Group Therapy:  Feelings about Diagnosis  Participation Level:  Active   Mood:pleasant   Description of Group:    This group will allow patients to explore their thoughts and feelings about diagnoses they have received. Patients will be guided to explore their level of understanding and acceptance of these diagnoses. Facilitator will encourage patients to process their thoughts and feelings about the reactions of others to their diagnosis, and will guide patients in identifying ways to discuss their diagnosis with significant others in their lives. This group will be process-oriented, with patients participating in exploration of their own experiences as well as giving and receiving support and challenge from other group members.   Therapeutic Goals: 1. Patient will demonstrate understanding of diagnosis as evidence by identifying two or more symptoms of the disorder:  2. Patient will be able to express two feelings regarding the diagnosis 3. Patient will demonstrate ability to communicate their needs through discussion and/or role plays  Summary of Patient Progress: Pt shared that she used to have schizophrenia and asked several good questions about the difference between schizophrenia and schizoaffective disorder.  Pt was active and made a number of contributions to the group discussion.        Therapeutic Modalities:   Cognitive Behavioral Therapy Brief Therapy Feelings Identification   Daleen SquibbGreg Lamar Meter, LCSW

## 2016-09-12 NOTE — Progress Notes (Signed)
Admission Note  D) Patient admitted to BMU alone. Patient is a 31 year old female who is IVC and was in no acute distress. Patient presents pleasant and cooperative. Patient appears hypomanic and hyperverbal. Patient was pleasant and cooperative during the admission process. Patient states she got into a verbal altercation with her mother on Mother's Day about the well-being of her children. Patient states she came to the ED to get drug tested and states "they called me manic, and said I was a danger and now I'm here". Patient reports "I was upset because they wanted to give me Haldol in the ED, and I died from that before". Patient reports "I didn't feel anything when they gave it to me this time". Patient reports "I am sober and now I have to convince everyone in my life that I am sober". Patient reports she has a court date on May 25 for "minor charges" and won't specify details. Patient reports she has recently been diagnosed with Hepatitis C and is supposed to have an appointment on May 17th for it. Patient denies SI/HI/AVH or pain. Patient identified money, family conflict and proving sobriety as stressors in her life. While here, patient reports wanting to work on "getting discharged" and "proving I am stable". Patient has no plan upon discharge. Patient states this is not her first in-patient hospitalization and states "I just want to go home".  A) Skin assessment was completed and unremarkable except for a bruise to her right shin, healing scabs to her left wrist from a tattoo, and multiple tattoos throughout her body. Patient has several red marks to her right arm that do not itch/hurt. Patient belongings searched with no contraband found. Belongings placed in locker. Plan of care, unit policies and patient expectations were explained. Patient receptive to information given with no questions. Patient verbalized understanding and contracted for safety on the unit. Consents obtained. Vital signs obtained  and WNL. Snacks and fluids provided. Patient oriented to the unit. Patient on standard q15 safety checks. Low fall risk precautions initiated and reviewed with patient; patient verbalized understanding. Patient medicated with sleep medications as prescribed.  R) Patient is in no acute distress. Patient remains safe on the unit at this time. Patient without questions or concerns at this time. Will continue to monitor.

## 2016-09-12 NOTE — H&P (Signed)
Psychiatric Admission Assessment Adult  Patient Identification: Janice Walls MRN:  [id number] Date of Evaluation:  09/12/2016 Chief Complaint:  Bipolar Principal Diagnosis: Bipolar affective disorder, current episode manic (HCC) Diagnosis:   Patient Active Problem List   Diagnosis Date Noted  . Bipolar affective disorder, current episode manic (HCC) [F31.9] 09/12/2016  . Tobacco use disorder [F17.200] 09/12/2016  . Cannabis use disorder, severe, dependence (HCC) [F12.20] 09/12/2016   History of Present Illness:  Patient is a 31 year old single Caucasian female from it in [place]. The patient has a history of bipolar disorder. She has been noncompliant with treatment.  Patient presented voluntarily to our emergency department on May 13 requesting a urine toxicology screen, patient brought her cat with her. Patient said that her mother thinks she is still using drugs when she is not.  She wanted a urine toxicology screen here so she can show her mom she was not abusing any substances.  Presentation the patient was clearly manic. Her thought process was disorganized. Her mother came to visit her and the patient became very agitated and verbally aggressive to her mother and the staff in the emergency department. The patient was discriminating yelling at staff patient tried to run up the hall and out of the area where she was. Patient started screaming that she was going to just kill herself.  During assessment today the patient was pleasant and cooperative but clearly euphoric, hyperverbal and has psychomotor agitation. The patient does not believe she is manic at this time. She denies having any problems with mood appetite, energy, sleep or concentration. She claims that she has been is sleeping 6 hours every night. Per nursing is that she slept 3 hours last night.  Patient denies the use of any illicit substances. Her urine toxicology is positive for cannabis. Patient smokes half a  pack of cigarettes per day. She denies the use of alcohol or any illicit substances.  Trauma history was not explored     Associated Signs/Symptoms: Depression Symptoms:  denies (Hypo) Manic Symptoms:  Distractibility, Elevated Mood, Impulsivity, Anxiety Symptoms:  Excessive Worry, Psychotic Symptoms:  denies PTSD Symptoms: NA Total Time spent with patient: 1 hour  Past Psychiatric History: Patient has been off medications for several years. She is not currently following up with anyone. She's been diagnosed with bipolar disorder since age 47. She has had multiple prior psychiatric hospitalizations. She has been in our unit in the past in 2016. She denies any history of suicidal attempts or self injury  Is the patient at risk to self? Yes.    Has the patient been a risk to self in the past 6 months? No.  Has the patient been a risk to self within the distant past? No.  Is the patient a risk to others? No.  Has the patient been a risk to others in the past 6 months? No.  Has the patient been a risk to others within the distant past? No.   Alcohol Screening: 1. How often do you have a drink containing alcohol?: Never 9. Have you or someone else been injured as a result of your drinking?: No 10. Has a relative or friend or a doctor or another health worker been concerned about your drinking or suggested you cut down?: No Alcohol Use Disorder Identification Test Final Score (AUDIT): 0 Brief Intervention: AUDIT score less than 7 or less-screening does not suggest unhealthy drinking-brief intervention not indicated  Past Medical History:  Past Medical History:  Diagnosis Date  .  Anxiety   . Bipolar 1 disorder (HCC)   . Depression   . Insomnia   . Substance abuse     Past Surgical History:  Procedure Laterality Date  . CESAREAN SECTION    . CESAREAN SECTION     Family History: History reviewed. No pertinent family history.  Family Psychiatric  History: Patient reports that  her 58 year old daughter has bipolar disorder. Her father was an alcoholic and an die of an overdose of crystal meth    Tobacco Screening: Have you used any form of tobacco in the last 30 days? (Cigarettes, Smokeless Tobacco, Cigars, and/or Pipes): Yes Tobacco use, Select all that apply: 4 or less cigarettes per day Are you interested in Tobacco Cessation Medications?: No, patient refused Counseled patient on smoking cessation including recognizing danger situations, developing coping skills and basic information about quitting provided: Yes  Social History: Patient is currently living by herself in [place]. She has 2 children ages 21 years old and a 62 year old. Her 32 year old was adopted by the patient's mother into treatment-year-old is currently living with the patient's mother as well. Patient has been married in the past she is currently divorced.  History  Alcohol Use No     History  Drug Use  . Types: Marijuana     Allergies:   Allergies  Allergen Reactions  . Aspirin Other (See Comments)    Causes headaches Causes headaches  . Morphine Other (See Comments)    Increases temperature   Lab Results:  Results for orders placed or performed during the hospital encounter of 09/12/16 (from the past 48 hour(s))  Lipid panel     Status: None   Collection Time: 09/12/16  6:52 AM  Result Value Ref Range   Cholesterol 130 0 - 200 mg/dL   Triglycerides 79 <409 mg/dL   HDL 41 >81 mg/dL   Total CHOL/HDL Ratio 3.2 RATIO   VLDL 16 0 - 40 mg/dL   LDL Cholesterol 73 0 - 99 mg/dL    Comment:        Total Cholesterol/HDL:CHD Risk Coronary Heart Disease Risk Table                     Men   Women  1/2 Average Risk   3.4   3.3  Average Risk       5.0   4.4  2 X Average Risk   9.6   7.1  3 X Average Risk  23.4   11.0        Use the calculated Patient Ratio above and the CHD Risk Table to determine the patient's CHD Risk.        ATP III CLASSIFICATION (LDL):  <100      mg/dL   Optimal  191-478  mg/dL   Near or Above                    Optimal  130-159  mg/dL   Borderline  295-621  mg/dL   High  >308     mg/dL   Very High   TSH     Status: None   Collection Time: 09/12/16  6:52 AM  Result Value Ref Range   TSH 4.463 0.350 - 4.500 uIU/mL    Comment: Performed by a 3rd Generation assay with a functional sensitivity of <=0.01 uIU/mL.    Blood Alcohol level:  Lab Results  Component Value Date   [facility] <5 12/06/2015    Metabolic Disorder  Labs:  No results found for: HGBA1C, MPG No results found for: PROLACTIN Lab Results  Component Value Date   CHOL 130 09/12/2016   TRIG 79 09/12/2016   HDL 41 09/12/2016   CHOLHDL 3.2 09/12/2016   VLDL 16 09/12/2016   LDLCALC 73 09/12/2016    Current Medications: Current Facility-Administered Medications  Medication Dose Route Frequency Provider Last Rate Last Dose  . acetaminophen (TYLENOL) tablet 650 mg  650 mg Oral Q6H PRN Clapacs, John T, MD      . alum & mag hydroxide-simeth (MAALOX/MYLANTA) 200-200-20 MG/5ML suspension 30 mL  30 mL Oral Q4H PRN Clapacs, John T, MD      . ARIPiprazole (ABILIFY) tablet 15 mg  15 mg Oral Daily Hernandez-Gonzalez, Sue LushAndrea, MD      . lithium carbonate (ESKALITH) CR tablet 450 mg  450 mg Oral Q12H Hernandez-Gonzalez, Sue LushAndrea, MD      . LORazepam (ATIVAN) tablet 2 mg  2 mg Oral QHS Hernandez-Gonzalez, Sue LushAndrea, MD      . magnesium hydroxide (MILK OF MAGNESIA) suspension 30 mL  30 mL Oral Daily PRN Clapacs, Jackquline DenmarkJohn T, MD       PTA Medications: Prescriptions Prior to Admission  Medication Sig Dispense Refill Last Dose  . divalproex (DEPAKOTE) 500 MG DR tablet Take 1 tablet (500 mg total) by mouth every 12 (twelve) hours. (Patient not taking: Reported on 09/11/2016) 90 tablet 1 Not Taking at unknow  . haloperidol (HALDOL) 2 MG tablet Take 1 tablet (2 mg total) by mouth 2 (two) times daily. (Patient not taking: Reported on 09/11/2016) 60 tablet 1 Not Taking at Unknown time  . hydrOXYzine  (ATARAX/VISTARIL) 25 MG tablet Take 1 tablet (25 mg total) by mouth 3 (three) times daily as needed for anxiety. (Patient not taking: Reported on 09/11/2016) 30 tablet 1 Not Taking at unknown  . omeprazole (PRILOSEC) 20 MG capsule Take 1 capsule (20 mg total) by mouth daily. (Patient not taking: Reported on 07/16/2015) 30 capsule 0   . oxyCODONE-acetaminophen (ROXICET) 5-325 MG tablet Take 1 tablet by mouth every 6 (six) hours as needed. (Patient not taking: Reported on 07/16/2015) 20 tablet 0 Not Taking at unknown  . potassium chloride 20 MEQ TBCR Take 20 mEq by mouth daily. (Patient not taking: Reported on 09/11/2016) 7 tablet 0 Not Taking at Unknown time  . promethazine (PHENERGAN) 12.5 MG suppository Place 1 suppository (12.5 mg total) rectally every 6 (six) hours as needed for nausea or vomiting. (Patient not taking: Reported on 09/11/2016) 12 each 0 Not Taking at unknown  . promethazine (PHENERGAN) 12.5 MG tablet Take 1 tablet (12.5 mg total) by mouth every 6 (six) hours as needed for nausea or vomiting. (Patient not taking: Reported on 09/11/2016) 30 tablet 0 Not Taking at unknown  . sulfamethoxazole-trimethoprim (BACTRIM DS,SEPTRA DS) 800-160 MG tablet Take 1 tablet by mouth 2 (two) times daily. (Patient not taking: Reported on 09/11/2016) 20 tablet 0 Not Taking at unknown    Musculoskeletal: Strength & Muscle Tone: within normal limits Gait & Station: normal Patient leans: N/A  Psychiatric Specialty Exam: Physical Exam  Constitutional: She is oriented to person, place, and time. She appears well-developed.  HENT:  Head: Normocephalic.  Eyes: Pupils are equal, round, and reactive to light.  Neck: Normal range of motion.  Respiratory: Effort normal.  Neurological: She is alert and oriented to person, place, and time.    Review of Systems  Constitutional: Negative.   HENT: Negative.   Eyes: Negative.   Respiratory: Negative.  Cardiovascular: Negative.   Gastrointestinal: Negative.    Genitourinary: Negative.   Musculoskeletal: Negative.   Skin: Negative.   Neurological: Negative.   Endo/Heme/Allergies: Negative.   Psychiatric/Behavioral: Negative for depression, hallucinations, memory loss, substance abuse and suicidal ideas. The patient is not nervous/anxious and does not have insomnia.     Blood pressure 113/75, pulse 72, temperature 98.2 F (36.8 C), temperature source Oral, resp. rate 18, height 5\' 1"  (1.549 m), weight 57.6 kg (127 lb), last menstrual period 08/27/2016, SpO2 100 %.Body mass index is 24 kg/m.  General Appearance: Disheveled  Eye Contact:  Good  Speech:  Pressured  Volume:  Increased  Mood:  Euphoric  Affect:  Congruent  Thought Process:  Linear and Descriptions of Associations: Tangential  Orientation:  Full (Time, Place, and Person)  Thought Content:  Logical and Hallucinations: None  Suicidal Thoughts:  No  Homicidal Thoughts:  No  Memory:  Immediate;   Poor Recent;   Good Remote;   Good  Judgement:  Poor  Insight:  Shallow  Psychomotor Activity:  Increased  Concentration:  Concentration: Poor and Attention Span: Poor  Recall:  Fair  Fund of Knowledge:  Good  Language:  Good  Akathisia:  No  Handed:    AIMS (if indicated):     Assets:  Manufacturing systems engineer Physical Health  ADL's:  Intact  Cognition:  WNL  Sleep:  Number of Hours: 3.25    Treatment Plan Summary:  Patient is a 31 year old Caucasian female with bipolar disorder type I. She is presenting manic in the setting of noncompliance with medications.  Bipolar disorder she will be started on Abilify 15 mg by mouth daily. I will also start her on lithium 450 mg twice a day  For insomnia the patient has been started on Ativan 2 mg by mouth daily at bedtime  Tobacco use disorder patient declines from receiving a nicotine patch  Substance abuse: Patient has been smoking marijuana but denies use of any other substances lately. She has been educated about the negative  effects of cannabis and other substances in mood.  Diet regular  Precautions every 15 minute checks  Hospitalization status voluntary  Disposition once a stable she will be discharged back to her home  Follow up to be determined  Labs Will check hemoglobin A1c TSH and lipid panel. Will need a lithium level in about 5 days   Physician Treatment Plan for Primary Diagnosis: Bipolar affective disorder, current episode manic (HCC) Long Term Goal(s): Improvement in symptoms so as ready for discharge  Short Term Goals: Ability to identify changes in lifestyle to reduce recurrence of condition will improve, Ability to demonstrate self-control will improve and Compliance with prescribed medications will improve  Physician Treatment Plan for Secondary Diagnosis: Principal Problem:   Bipolar affective disorder, current episode manic (HCC) Active Problems:   Tobacco use disorder   Cannabis use disorder, severe, dependence (HCC)  Long Term Goal(s): Improvement in symptoms so as ready for discharge  Short Term Goals: Ability to identify and develop effective coping behaviors will improve and Ability to identify triggers associated with substance abuse/mental health issues will improve  I certify that inpatient services furnished can reasonably be expected to improve the patient's condition.    Jimmy Footman, MD 5/15/201812:58 PM

## 2016-09-12 NOTE — Progress Notes (Signed)
Recreation Therapy Notes  Date: 05.15.18 Time: 9:30 am Location: Craft Room  Group Topic: Self-expression  Goal Area(s) Addresses:  Patient will effectively use art as a means of self-expression. Patient will recognize positive benefit of self-expression. Patient will be able to identify one emotion experienced during group session. Patient will identify use of art as a coping skill.  Behavioral Response: Attentive, Interactive, Left early  Intervention: Two Faces of Me  Activity: Patients were given blank face worksheets and were instructed to draw a line down the middle. On one side of the worksheet, patients were instructed to draw or write how they felt when they were admitted to the hospital. On the other side, patients were instructed to draw or write how they want to feel when they are d/c.  Education: LRT educated patients on other forms of self-expression.  Education Outcome: Acknowledges education/In group clarification offered  Clinical Observations/Feedback: Patient drew and wrote how she felt when she was admitted to the hospital and how she wants to feel when she is d/c. Patient contributed to group discussion by stating how her faces are different, and what she has learned since being in the hospital. Patient left group at approximately 9:55 am with Dr. Ardyth HarpsHernandez and did not return to group.  [name],Merril Nagy M, LRT/CTRS 09/12/2016 10:31 AM

## 2016-09-12 NOTE — [facility] Suicide Risk Assessment (Signed)
[facility] Admission Suicide Risk Assessment   [facility]:    Current Mental Status:    Loss Factors:    Historical Factors:    Risk Reduction Factors:     Total Time spent with patient: 1 hour Principal Problem: Bipolar affective disorder, current episode manic (HCC) Diagnosis:   Patient Active Problem List   Diagnosis Date Noted  . Bipolar affective disorder, current episode manic (HCC) [F31.9] 09/12/2016  . Tobacco use disorder [F17.200] 09/12/2016  . Cannabis use disorder, severe, dependence (HCC) [F12.20] 09/12/2016   Subjective Data:   Continued Clinical Symptoms:  Alcohol Use Disorder Identification Test Final Score (AUDIT): 0 The "Alcohol Use Disorders Identification Test", Guidelines for Use in Primary Care, Second Edition.  World Science writerHealth Organization [facility](WHO). Score between 0-7:  no or low risk or alcohol related problems. Score between 8-15:  moderate risk of alcohol related problems. Score between 16-19:  high risk of alcohol related problems. Score 20 or above:  warrants further diagnostic evaluation for alcohol dependence and treatment.   CLINICAL FACTORS:   Severe Anxiety and/or Agitation Bipolar Disorder:   Mixed State Previous Psychiatric Diagnoses and Treatments   Psychiatric Specialty Exam: Physical Exam  ROS  Blood pressure 113/75, pulse 72, temperature 98.2 F (36.8 C), temperature source Oral, resp. rate 18, height 5\' 1"  (1.549 m), weight 57.6 kg (127 lb), last menstrual period 08/27/2016, SpO2 100 %.Body mass index is 24 kg/m.                                                    Sleep:  Number of Hours: 3.25      COGNITIVE FEATURES THAT CONTRIBUTE TO RISK:  Loss of executive function    SUICIDE RISK:   Moderate:  Frequent suicidal ideation with limited intensity, and duration, some specificity in terms of plans, no associated intent, good self-control, limited dysphoria/symptomatology,  some risk factors present, and identifiable protective factors, including available and accessible social support.  [facility]  I certify that inpatient services furnished can reasonably be expected to improve the patient's condition.   Jimmy FootmanHernandez-Gonzalez,  Tyreesha Maharaj, MD 09/12/2016, 12:57 PM

## 2016-09-12 NOTE — Tx Team (Signed)
Initial Treatment Plan 09/12/2016 2:07 AM Janice DurandJennifer Przybysz ZOX:[id number]    PATIENT STRESSORS: Family Conflict Financial Issues Substance Abuse    PATIENT STRENGTHS: Active sense of humor Average or above average intelligence Capable of independent living Motivation for treatment/growth Supportive family/friends   PATIENT IDENTIFIED PROBLEMS: "Get discharged"  "To prove I am stable to family"  Ineffective coping                 DISCHARGE CRITERIA:  Ability to meet basic life and health needs Adequate post-discharge living arrangements Improved stabilization in mood, thinking, and/or behavior Medical problems require only outpatient monitoring Motivation to continue treatment in a less acute level of care Need for constant or close observation no longer present Reduction of life-threatening or endangering symptoms to within safe limits Safe-care adequate arrangements made Verbal commitment to aftercare and medication compliance  PRELIMINARY DISCHARGE PLAN: Outpatient therapy  PATIENT/FAMILY INVOLVEMENT: This treatment plan has been presented to and reviewed with the patient, Janice DurandJennifer Brazier.  The patient and family have been given the opportunity to ask questions and make suggestions.  Ferrel LoganAmanda A Jakylan Ron, RN 09/12/2016, 2:07 AM

## 2016-09-12 NOTE — Progress Notes (Signed)
Denies SI/HI/AVH.  Patient is manic and euphoric.  Hyper-verbal.  Requires frequent re-direction.  Presents frequently to nurses station with various requests.  Verbalizes that she needs to be discharged in time to make it to an appointment on 09/14/2016 for appointment to start medications for her Hepatitis B. Dr.  Ardyth HarpsHernandez informed of this.  Patient is medication and group compliant.  Support and encouragement offered.  Safety maintained.

## 2016-09-12 NOTE — [facility] Group Notes (Signed)
[facility] Group Notes:  (Nursing/MHT/Case Management/Adjunct)  Date:  09/12/2016  Time:  3:01 PM  Type of Therapy:  Psychoeducational Skills  Participation Level:  Active  Participation Quality:  Appropriate and Attentive  Affect:  Appropriate  Cognitive:  Appropriate  Insight:  Improving  Engagement in Group:  Engaged  Modes of Intervention:  Discussion and Education  Summary of Progress/Problems:  Janice Walls 09/12/2016, 3:01 PM
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
## 2016-09-13 LAB — HEMOGLOBIN A1C
HEMOGLOBIN A1C: 4.8 % (ref 4.8–5.6)
Mean Plasma Glucose: 91 mg/dL

## 2016-09-13 LAB — PROLACTIN: PROLACTIN: 37.4 ng/mL — AB (ref 4.8–23.3)

## 2016-09-13 MED ORDER — ARIPIPRAZOLE 10 MG PO TABS
20.0000 mg | ORAL_TABLET | Freq: Every day | ORAL | Status: DC
  Administered 2016-09-13 – 2016-09-14 (×2): 20 mg via ORAL
  Filled 2016-09-13 (×2): qty 2

## 2016-09-13 NOTE — Plan of Care (Signed)
Problem: Safety: Goal: Periods of time without injury will increase Outcome: Progressing Pt safe on the unit at this time   

## 2016-09-13 NOTE — Progress Notes (Signed)
Nursing Progress Note 1900-0730  D) Patient presents manic, hyperverbal and restless but is cooperative on the unit. Patient denies SI/HI/AVH or pain. Patient contracts for safety on the unit. Patient went to sleep before group on the unit and did not come up for PM medications.  A)  Emotional support given. 1:1 interaction and active listening provided. No medications given at this time. Snacks and fluids provided. Opportunities for questions or concerns presented to patient. Patient encouraged to continue to work on treatment goals. Labs, vital signs and patient behavior monitored throughout shift. Patient safety maintained with q15 min safety checks. Low fall risk precautions in place and reviewed with patient; patient verbalized understanding.  R) Patient remains safe on the unit at this time. Patient is resting in bed without complaints. Will continue to monitor.

## 2016-09-13 NOTE — Progress Notes (Signed)
D: Pt denies SI/HI/AVH. Pt is pleasant and cooperative. Pt stated she was better, not depressed anymore.   A: Pt was offered support and encouragement. Pt was given scheduled medications. Pt was encourage to attend groups. Q 15 minute checks were done for safety.   R:Pt attends groups and interacts well with peers and staff. Pt is taking medication. Pt has no complaints.Pt receptive to treatment and safety maintained on unit.

## 2016-09-13 NOTE — Progress Notes (Signed)
Patient is visible in the unit,pacing in the hallway,talking with every peers.Rated her depression 2-3/10 and anxiety 0/10.Very pleasant and cooperative on approach.Denies suicidal or homicidal ideations and AV hallucinations.Compliant with medications.Attended groups.Appetite & energy level good.Support & encouragement given.

## 2016-09-13 NOTE — BHH Group Notes (Signed)
B[facility] Group Notes:  (Nursing/MHT/Case Management/Adjunct)  Date:  09/13/2016  Time:  4:28 PM  Type of Therapy:  Psychoeducational Skills  Participation Level:  Active  Participation Quality:  Appropriate  Affect:  Appropriate  Cognitive:  Oriented  Insight:  Appropriate  Engagement in Group:  Engaged  Modes of Intervention:  Discussion and Education  Summary of Progress/Problems:  Janice Walls 09/13/2016, 4:28 PM

## 2016-09-13 NOTE — Progress Notes (Signed)
[facility] MD Progress Note  09/13/2016 8:26 AM Janice Walls  MRN:  [id number] Subjective:  Patient is a 31 year old single Caucasian female from it in [place]. The patient has a history of bipolar disorder. She has been noncompliant with treatment.  Patient presented voluntarily to our emergency department on May 13 requesting a urine toxicology screen, patient brought her cat with her. Patient said that her mother thinks she is still using drugs when she is not.  She wanted a urine toxicology screen here so she can show her mom she was not abusing any substances.  Presentation the patient was clearly manic. Her thought process was disorganized. Her mother came to visit her and the patient became very agitated and verbally aggressive to her mother and the staff in the emergency department. The patient was discriminating yelling at staff patient tried to run up the hall and out of the area where she was. Patient started screaming that she was going to just kill herself.  During assessment today the patient was pleasant and cooperative but clearly euphoric, hyperverbal and has psychomotor agitation. The patient does not believe she is manic at this time. She denies having any problems with mood appetite, energy, sleep or concentration. She claims that she has been is sleeping 6 hours every night. Per nursing is that she slept 3 hours last night.  5/16  Pt still euphoric, significant psychomotor agitation. She is seeing pacing around the unit talking loudly on the phone and talking loudly to peers. Thought process was linear today. Denies any suicidality, homicidality or auditory or visual hallucinations. She denies any side effects from her medications. She denies any physical complaints. States that she slept very well last night  Per nursing:  Nursing Progress Note 1900-0730  D) Patient presents manic, hyperverbal and restless but is cooperative on the unit. Patient denies SI/HI/AVH or  pain. Patient contracts for safety on the unit. Patient went to sleep before group on the unit and did not come up for PM medications.  A)  Emotional support given. 1:1 interaction and active listening provided. No medications given at this time. Snacks and fluids provided. Opportunities for questions or concerns presented to patient. Patient encouraged to continue to work on treatment goals. Labs, vital signs and patient behavior monitored throughout shift. Patient safety maintained with q15 min safety checks. Low fall risk precautions in place and reviewed with patient; patient verbalized understanding.  R) Patient remains safe on the unit at this time. Patient is resting in bed without complaints. Will continue to monitor.   Principal Problem: Bipolar affective disorder, current episode manic (HCC) Diagnosis:   Patient Active Problem List   Diagnosis Date Noted  . Bipolar affective disorder, current episode manic (HCC) [F31.9] 09/12/2016  . Tobacco use disorder [F17.200] 09/12/2016  . Cannabis use disorder, severe, dependence (HCC) [F12.20] 09/12/2016   Total Time spent with patient: 30 minutes  Past Psychiatric History: Patient has been off medications for several years. She is not currently following up with anyone. She's been diagnosed with bipolar disorder since age 89. She has had multiple prior psychiatric hospitalizations. She has been in our unit in the past in 2016. She denies any history of suicidal attempts or self injury  Past Medical History:  Past Medical History:  Diagnosis Date  . Anxiety   . Bipolar 1 disorder (HCC)   . Depression   . Insomnia   . Substance abuse     Past Surgical History:  Procedure Laterality Date  .  CESAREAN SECTION    . CESAREAN SECTION     Family History: History reviewed. No pertinent family history.   Family Psychiatric  History: Patient reports that her 32 year old daughter has bipolar disorder. Her father was an alcoholic and an die of  an overdose of crystal meth  Social History: Patient is currently living by herself in [place]. She has 2 children ages 32 years old and a 32 year old. Her 32 year old was adopted by the patient's mother into treatment-year-old is currently living with the patient's mother as well. Patient has been married in the past she is currently divorced.  History  Alcohol Use No     History  Drug Use  . Types: Marijuana    Social History   Social History  . Marital status: Single    Spouse name: N/A  . Number of children: N/A  . Years of education: N/A   Social History Main Topics  . Smoking status: Current Some Day Smoker    Packs/day: 0.50    Types: Cigarettes  . Smokeless tobacco: Never Used  . Alcohol use No  . Drug use: Yes    Types: Marijuana  . Sexual activity: Yes    Birth control/ protection: None   Other Topics Concern  . None   Social History Narrative  . None     Current Medications: Current Facility-Administered Medications  Medication Dose Route Frequency Provider Last Rate Last Dose  . acetaminophen (TYLENOL) tablet 650 mg  650 mg Oral Q6H PRN Clapacs, John T, MD      . alum & mag hydroxide-simeth (MAALOX/MYLANTA) 200-200-20 MG/5ML suspension 30 mL  30 mL Oral Q4H PRN Clapacs, John T, MD      . ARIPiprazole (ABILIFY) tablet 20 mg  20 mg Oral Daily Jimmy FootmanHernandez-Gonzalez, Shariya Gaster, MD   20 mg at 09/13/16 [id number]  . eye wash ((SODIUM/POTASSIUM/SOD CHLORIDE)) ophthalmic solution 2 drop  2 drop Both Eyes PRN Jimmy FootmanHernandez-Gonzalez, Kennieth Plotts, MD      . lithium carbonate (ESKALITH) CR tablet 450 mg  450 mg Oral Q12H Jimmy FootmanHernandez-Gonzalez, Jonquil Stubbe, MD   450 mg at 09/13/16 [id number]  . LORazepam (ATIVAN) tablet 2 mg  2 mg Oral QHS Jimmy FootmanHernandez-Gonzalez, Mylasia Vorhees, MD      . magnesium hydroxide (MILK OF MAGNESIA) suspension 30 mL  30 mL Oral Daily PRN Clapacs, Jackquline DenmarkJohn T, MD        Lab Results:  Results for orders placed or performed during the hospital encounter of 09/12/16 (from the past 48  hour(s))  Hemoglobin A1c     Status: None   Collection Time: 09/12/16  6:52 AM  Result Value Ref Range   Hgb A1c MFr Bld 4.8 4.8 - 5.6 %    Comment: (NOTE)         Pre-diabetes: 5.7 - 6.4         Diabetes: >6.4         Glycemic control for adults with diabetes: <7.0    Mean Plasma Glucose 91 mg/dL    Comment: (NOTE) Performed At: [facility] [address] [place], [place] [id number] Mila HomerHancock William F MD VZ:[id number]:[phone]   Lipid panel     Status: None   Collection Time: 09/12/16  6:52 AM  Result Value Ref Range   Cholesterol 130 0 - 200 mg/dL   Triglycerides 79 <295<150 mg/dL   HDL 41 >18>40 mg/dL   Total CHOL/HDL Ratio 3.2 RATIO   VLDL 16 0 - 40 mg/dL   LDL Cholesterol 73 0 - 99 mg/dL  Comment:        Total Cholesterol/HDL:CHD Risk Coronary Heart Disease Risk Table                     Men   Women  1/2 Average Risk   3.4   3.3  Average Risk       5.0   4.4  2 X Average Risk   9.6   7.1  3 X Average Risk  23.4   11.0        Use the calculated Patient Ratio above and the CHD Risk Table to determine the patient's CHD Risk.        ATP III CLASSIFICATION (LDL):  <100     mg/dL   Optimal  161-096  mg/dL   Near or Above                    Optimal  130-159  mg/dL   Borderline  045-409  mg/dL   High  >811     mg/dL   Very High   Prolactin     Status: Abnormal   Collection Time: 09/12/16  6:52 AM  Result Value Ref Range   Prolactin 37.4 (H) 4.8 - 23.3 ng/mL    Comment: (NOTE) Performed At: [facility] [address] [place], [place] [id number] Mila Homer MD [id number]   TSH     Status: None   Collection Time: 09/12/16  6:52 AM  Result Value Ref Range   TSH 4.463 0.350 - 4.500 uIU/mL    Comment: Performed by a 3rd Generation assay with a functional sensitivity of <=0.01 uIU/mL.    Blood Alcohol level:  Lab Results  Component Value Date   ETH <5 12/06/2015    Metabolic Disorder Labs: Lab Results  Component Value Date   HGBA1C 4.8 09/12/2016    MPG 91 09/12/2016   Lab Results  Component Value Date   PROLACTIN 37.4 (H) 09/12/2016   Lab Results  Component Value Date   CHOL 130 09/12/2016   TRIG 79 09/12/2016   HDL 41 09/12/2016   CHOLHDL 3.2 09/12/2016   VLDL 16 09/12/2016   LDLCALC 73 09/12/2016    Physical Findings: AIMS: Facial and Oral Movements Muscles of Facial Expression: None, normal Lips and Perioral Area: None, normal Jaw: None, normal Tongue: None, normal,Extremity Movements Upper (arms, wrists, hands, fingers): None, normal Lower (legs, knees, ankles, toes): None, normal, Trunk Movements Neck, shoulders, hips: None, normal, Overall Severity Severity of abnormal movements (highest score from questions above): None, normal Incapacitation due to abnormal movements: None, normal Patient's awareness of abnormal movements (rate only patient's report): No Awareness, Dental Status Current problems with teeth and/or dentures?: Yes (Poor dental hygiene) Does patient usually wear dentures?: No  CIWA:    COWS:     Musculoskeletal: Strength & Muscle Tone: within normal limits Gait & Station: normal Patient leans: N/A  Psychiatric Specialty Exam: Physical Exam  Constitutional: She is oriented to person, place, and time. She appears well-developed and well-nourished.  HENT:  Head: Normocephalic and atraumatic.  Eyes: Conjunctivae and EOM are normal.  Neck: Normal range of motion.  Respiratory: Effort normal.  Musculoskeletal: Normal range of motion.  Neurological: She is alert and oriented to person, place, and time.    Review of Systems  Constitutional: Negative.   HENT: Negative.   Eyes: Negative.   Respiratory: Negative.   Cardiovascular: Negative.   Gastrointestinal: Negative.   Genitourinary: Negative.   Musculoskeletal: Negative.  Skin: Negative.   Neurological: Negative.   Endo/Heme/Allergies: Negative.   Psychiatric/Behavioral: Negative for depression, hallucinations, memory loss,  substance abuse and suicidal ideas. The patient is not nervous/anxious and does not have insomnia.     Blood pressure 114/70, pulse 81, temperature 98.2 F (36.8 C), temperature source Oral, resp. rate 18, height 5\' 1"  (1.549 m), weight 57.6 kg (127 lb), last menstrual period 08/27/2016, SpO2 100 %.Body mass index is 24 kg/m.  General Appearance: Fairly Groomed  Eye Contact:  Good  Speech:  Clear and Coherent  Volume:  Increased  Mood:  Euphoric  Affect:  Congruent  Thought Process:  Linear and Descriptions of Associations: Intact  Orientation:  Full (Time, Place, and Person)  Thought Content:  Hallucinations: None  Suicidal Thoughts:  No  Homicidal Thoughts:  No  Memory:  Immediate;   Fair Recent;   Fair Remote;   Fair  Judgement:  Poor  Insight:  Shallow  Psychomotor Activity:  Increased  Concentration:  Concentration: Poor and Attention Span: Poor  Recall:  Good  Fund of Knowledge:  Good  Language:  Good  Akathisia:  No  Handed:    AIMS (if indicated):     Assets:  Manufacturing systems engineer Social Support  ADL's:  Intact  Cognition:  WNL  Sleep:  Number of Hours: 7.75     Treatment Plan Summary: Daily contact with patient to assess and evaluate symptoms and progress in treatment and Medication management   Patient is a 31 year old Caucasian female with bipolar disorder type I. She is presenting manic in the setting of noncompliance with medications.  Bipolar disorder she will be started on Abilify 15 mg by mouth daily. I will also start her on lithium 450 mg twice a day  Today I will increase the Abilify to 20 mg a day. Plan to check lithium level on Monday morning  For insomnia the patient has been started on Ativan 2 mg by mouth daily at bedtime----patient slept well last night  Tobacco use disorder patient declines from receiving a nicotine patch  Substance abuse: Patient has been smoking marijuana but denies use of any other substances lately. She has been  educated about the negative effects of cannabis and other substances in mood.  Diet regular  Precautions every 15 minute checks  Hospitalization status voluntary  Disposition once a stable she will be discharged back to her home  Follow up to be determined  Labs: TSH normal, hemoglobin A1c 4.8, lipid panel within the normal limits.  Urine pregnancy negative.  Renal function within the normal limits  Approximate length of stay up to 5 days   Jimmy Footman, MD 09/13/2016, 8:26 AM

## 2016-09-13 NOTE — Progress Notes (Signed)
Recreation Therapy Notes  INPATIENT RECREATION THERAPY ASSESSMENT  Patient Details Name: Janice Walls MRN: [id number] DOB: 03/17/1986 Today's Date: 09/13/2016  Patient Stressors: Family, Relationship, Death, Other (Comment) Got into an argument with her mother on Mother's Day because she wanted her car back; daughter said she her had school that pt was still on drugs; broke-up a couple months ago and the guy is facing 10-15 years; 2 year anniversary of her 2 best friends dying; trying to prove she is okay again; [place]  Coping Skills:   Exercise, Art/Dance, Talking, Music, Sports, Other (Comment) (Write, read, hugs)  Personal Challenges: Anger  Leisure Interests (2+):  Individual - Other (Comment) (Dancing, cleaning)  Awareness of Community Resources:  Yes  Community Resources:  [employer], Park, Other (Comment) Building surveyor(Pool)  Current Use: No  If no, Barriers?: Financial  Patient Strengths:  Thoughts, heart  Patient Identified Areas of Improvement:  Parenting  Current Recreation Participation:  Hanging out with friend  Patient Goal for Hospitalization:  To be released and gather as much information as she can for things she needs when she leaves  [place] Residence:  [place]  County of Residence:     Current SI (including self-harm):  No  Current HI:  No  Consent to Intern Participation: N/A   [name],Janice Forton M, LRT/CTRS 09/13/2016, 2:29 PM

## 2016-09-13 NOTE — Plan of Care (Signed)
Problem: Safety: Goal: Periods of time without injury will increase Outcome: Progressing Patient contracts for safety on the unit and is on q15 minute safety checks.   

## 2016-09-13 NOTE — BHH Group Notes (Signed)
  B[facility] LCSW Group Therapy Note  Date/Time: 09/13/16, 1300  Type of Therapy/Topic:  Group Therapy:  Emotion Regulation  Participation Level:  Active   Mood: pleasant  Description of Group:    The purpose of this group is to assist patients in learning to regulate negative emotions and experience positive emotions. Patients will be guided to discuss ways in which they have been vulnerable to their negative emotions. These vulnerabilities will be juxtaposed with experiences of positive emotions or situations, and patients challenged to use positive emotions to combat negative ones. Special emphasis will be placed on coping with negative emotions in conflict situations, and patients will process healthy conflict resolution skills.  Therapeutic Goals: 1. Patient will identify two positive emotions or experiences to reflect on in order to balance out negative emotions:  2. Patient will label two or more emotions that they find the most difficult to experience:  3. Patient will be able to demonstrate positive conflict resolution skills through discussion or role plays:   Summary of Patient Progress: Pt identified fear as the emotion that is difficult for her to experience.  She was attentive in group and made several contributions to the discussion.      Therapeutic Modalities:   Cognitive Behavioral Therapy Feelings Identification Dialectical Behavioral Therapy  Daleen SquibbGreg Delisia Mcquiston, LCSW

## 2016-09-13 NOTE — Progress Notes (Signed)
Recreation Therapy Notes  Date: 05.16.18 Time: 9:30 am Location: Craft Room  Group Topic: Self-esteem  Goal Area(s) Addresses:  Patient will write at least one positive trait about self. Patient will verbalize benefit of having a healthy self-esteem.  Behavioral Response: Attentive, Interactive  Intervention: I Am  Activity: Patients were given a worksheet with the letter I on it and were instructed to write as many positive traits about themselves inside the letter.  Education: LRT educated patients on ways they can increase their self-esteem.  Education Outcome: Acknowledges education/In group clarification offered  Clinical Observations/Feedback: Patient wrote positive traits about self. Patient contributed to group discussion by stating it was easy to think of positive traits, how her self-esteem affects her, and how she can increase her self-esteem.  [name],Anthonyjames Bargar M, LRT/CTRS 09/13/2016 10:35 AM

## 2016-09-14 MED ORDER — ARIPIPRAZOLE ER 400 MG IM SRER: 400.0000 mg | INTRAMUSCULAR | 0 refills | Status: DC

## 2016-09-14 MED ORDER — LITHIUM CARBONATE ER 450 MG PO TBCR
450.0000 mg | EXTENDED_RELEASE_TABLET | Freq: Two times a day (BID) | ORAL | Status: DC
  Administered 2016-09-14 – 2016-09-18 (×8): 450 mg via ORAL
  Filled 2016-09-14 (×8): qty 1

## 2016-09-14 MED ORDER — ARIPIPRAZOLE ER 400 MG IM SRER
400.0000 mg | INTRAMUSCULAR | Status: DC
  Administered 2016-09-14: 400 mg via INTRAMUSCULAR
  Filled 2016-09-14: qty 400

## 2016-09-14 MED ORDER — ARIPIPRAZOLE 10 MG PO TABS
20.0000 mg | ORAL_TABLET | Freq: Every day | ORAL | Status: DC
  Administered 2016-09-15: 20 mg via ORAL
  Filled 2016-09-14: qty 2

## 2016-09-14 NOTE — [facility] Group Notes (Signed)
[facility] Group Notes:  (Nursing/MHT/Case Management/Adjunct)  Date:  09/14/2016  Time:  4:15 PM  Type of Therapy:  Psychoeducational Skills  Participation Level:  Did Not Attend   Janice BullaYASMINE W [facility] 09/14/2016, 4:15 PM

## 2016-09-14 NOTE — Tx Team (Signed)
Interdisciplinary Treatment and Diagnostic Plan Update  09/14/2016 Time of Session: 1055 Janice Walls MRN: [id number]  Principal Diagnosis: Bipolar affective disorder, current episode manic (HCC)  Secondary Diagnoses: Principal Problem:   Bipolar affective disorder, current episode manic (HCC) Active Problems:   Tobacco use disorder   Cannabis use disorder, severe, dependence (HCC)   Current Medications:  Current Facility-Administered Medications  Medication Dose Route Frequency Provider Last Rate Last Dose  . acetaminophen (TYLENOL) tablet 650 mg  650 mg Oral Q6H PRN Clapacs, John T, MD      . alum & mag hydroxide-simeth (MAALOX/MYLANTA) 200-200-20 MG/5ML suspension 30 mL  30 mL Oral Q4H PRN Clapacs, Jackquline Denmark, MD      . Melene Muller ON 09/15/2016] ARIPiprazole (ABILIFY) tablet 20 mg  20 mg Oral QPC breakfast Janice Footman, MD      . ARIPiprazole ER SRER 400 mg  400 mg Intramuscular Q30 days Janice Footman, MD      . eye wash ((SODIUM/POTASSIUM/SOD CHLORIDE)) ophthalmic solution 2 drop  2 drop Both Eyes PRN Janice Footman, MD      . lithium carbonate (ESKALITH) CR tablet 450 mg  450 mg Oral BID Janice Footman, MD      . LORazepam (ATIVAN) tablet 2 mg  2 mg Oral QHS Janice Footman, MD   2 mg at 09/13/16 2133  . magnesium hydroxide (MILK OF MAGNESIA) suspension 30 mL  30 mL Oral Daily PRN Clapacs, Jackquline Denmark, MD       PTA Medications: Prescriptions Prior to Admission  Medication Sig Dispense Refill Last Dose  . divalproex (DEPAKOTE) 500 MG DR tablet Take 1 tablet (500 mg total) by mouth every 12 (twelve) hours. (Patient not taking: Reported on 09/11/2016) 90 tablet 1 Not Taking at unknow  . haloperidol (HALDOL) 2 MG tablet Take 1 tablet (2 mg total) by mouth 2 (two) times daily. (Patient not taking: Reported on 09/11/2016) 60 tablet 1 Not Taking at Unknown time  . hydrOXYzine (ATARAX/VISTARIL) 25 MG tablet Take 1 tablet (25 mg total) by  mouth 3 (three) times daily as needed for anxiety. (Patient not taking: Reported on 09/11/2016) 30 tablet 1 Not Taking at unknown  . omeprazole (PRILOSEC) 20 MG capsule Take 1 capsule (20 mg total) by mouth daily. (Patient not taking: Reported on 07/16/2015) 30 capsule 0   . oxyCODONE-acetaminophen (ROXICET) 5-325 MG tablet Take 1 tablet by mouth every 6 (six) hours as needed. (Patient not taking: Reported on 07/16/2015) 20 tablet 0 Not Taking at unknown  . potassium chloride 20 MEQ TBCR Take 20 mEq by mouth daily. (Patient not taking: Reported on 09/11/2016) 7 tablet 0 Not Taking at Unknown time  . promethazine (PHENERGAN) 12.5 MG suppository Place 1 suppository (12.5 mg total) rectally every 6 (six) hours as needed for nausea or vomiting. (Patient not taking: Reported on 09/11/2016) 12 each 0 Not Taking at unknown  . promethazine (PHENERGAN) 12.5 MG tablet Take 1 tablet (12.5 mg total) by mouth every 6 (six) hours as needed for nausea or vomiting. (Patient not taking: Reported on 09/11/2016) 30 tablet 0 Not Taking at unknown  . sulfamethoxazole-trimethoprim (BACTRIM DS,SEPTRA DS) 800-160 MG tablet Take 1 tablet by mouth 2 (two) times daily. (Patient not taking: Reported on 09/11/2016) 20 tablet 0 Not Taking at unknown    Patient Stressors:    Patient Strengths: Active sense of humor Average or above average intelligence Capable of independent living Motivation for treatment/growth Supportive family/friends  Treatment Modalities: Medication Management, Group therapy, Case management,  1  to 1 session with clinician, Psychoeducation, Recreational therapy.   Physician Treatment Plan for Primary Diagnosis: Bipolar affective disorder, current episode manic (HCC) Long Term Goal(s): Improvement in symptoms so as ready for discharge Improvement in symptoms so as ready for discharge   Short Term Goals: Ability to identify changes in lifestyle to reduce recurrence of condition will improve Ability to  demonstrate self-control will improve Compliance with prescribed medications will improve Ability to identify and develop effective coping behaviors will improve Ability to identify triggers associated with substance abuse/mental health issues will improve  Medication Management: Evaluate patient's response, side effects, and tolerance of medication regimen.  Therapeutic Interventions: 1 to 1 sessions, Unit Group sessions and Medication administration.  Evaluation of Outcomes: Progressing  Physician Treatment Plan for Secondary Diagnosis: Principal Problem:   Bipolar affective disorder, current episode manic (HCC) Active Problems:   Tobacco use disorder   Cannabis use disorder, severe, dependence (HCC)  Long Term Goal(s): Improvement in symptoms so as ready for discharge Improvement in symptoms so as ready for discharge   Short Term Goals: Ability to identify changes in lifestyle to reduce recurrence of condition will improve Ability to demonstrate self-control will improve Compliance with prescribed medications will improve Ability to identify and develop effective coping behaviors will improve Ability to identify triggers associated with substance abuse/mental health issues will improve     Medication Management: Evaluate patient's response, side effects, and tolerance of medication regimen.  Therapeutic Interventions: 1 to 1 sessions, Unit Group sessions and Medication administration.  Evaluation of Outcomes: Progressing   RN Treatment Plan for Primary Diagnosis: Bipolar affective disorder, current episode manic (HCC) Long Term Goal(s): Knowledge of disease and therapeutic regimen to maintain health will improve  Short Term Goals: Ability to participate in decision making will improve, Ability to identify and develop effective coping behaviors will improve and Compliance with prescribed medications will improve  Medication Management: RN will administer medications as ordered  by provider, will assess and evaluate patient's response and provide education to patient for prescribed medication. RN will report any adverse and/or side effects to prescribing provider.  Therapeutic Interventions: 1 on 1 counseling sessions, Psychoeducation, Medication administration, Evaluate responses to treatment, Monitor vital signs and CBGs as ordered, Perform/monitor CIWA, COWS, AIMS and Fall Risk screenings as ordered, Perform wound care treatments as ordered.  Evaluation of Outcomes: Progressing   LCSW Treatment Plan for Primary Diagnosis: Bipolar affective disorder, current episode manic (HCC) Long Term Goal(s): Safe transition to appropriate next level of care at discharge, Engage patient in therapeutic group addressing interpersonal concerns.  Short Term Goals: Engage patient in aftercare planning with referrals and resources and Increase skills for wellness and recovery  Therapeutic Interventions: Assess for all discharge needs, 1 to 1 time with Social worker, Explore available resources and support systems, Assess for adequacy in community support network, Educate family and significant other(s) on suicide prevention, Complete Psychosocial Assessment, Interpersonal group therapy.  Evaluation of Outcomes: Progressing   Progress in Treatment: Attending groups: Yes. Participating in groups: Yes. Taking medication as prescribed: Yes. Toleration medication: Yes. Family/Significant other contact made: No, will contact:  mother Patient understands diagnosis: Yes. Discussing patient identified problems/goals with staff: Yes. Medical problems stabilized or resolved: Yes. Denies suicidal/homicidal ideation: Yes. Issues/concerns per patient self-inventory: No. Other: none  New problem(s) identified: No, Describe:  none  New Short Term/Long Term Goal(s): Pt goal: I need my medication back in order and also to begin therapy.  Discharge Plan or Barriers: Pt will return to  outpatient services.  Reason for Continuation of Hospitalization: Medication stabilization  Estimated Length of Stay: 3-5 days.  Attendees: Patient: Janice DurandJennifer Haese 09/14/2016   Physician: Dr. Ardyth HarpsHernandez, MD 09/14/2016   Nursing: Hulan AmatoGwen Farrish, RN 09/14/2016   RN Care Manager: 09/14/2016   Social Worker: Daleen SquibbGreg Chrishelle Zito, LCSW 09/14/2016   Recreational Therapist: Hershal CoriaBeth Greene, LRT/CTRS  09/14/2016   Other:  09/14/2016   Other:  09/14/2016   Other: 09/14/2016      Scribe for Treatment Team: Lorri FrederickWierda, Jill Ruppe Jon, LCSW 09/14/2016 12:52 PM

## 2016-09-14 NOTE — Progress Notes (Signed)
Patient pleasant & cooperative in the unit.Rated her depression 0/10.Denies suicidal or homicidal ideations and AV hallucinations.Patient vomited x1 this morning.No nausea verbalized after that.Appropriate with staff & peers.Compliant with medications.Attended groups.Appetite & energy level good.Support & encouragement given.

## 2016-09-14 NOTE — [facility] Group Notes (Signed)
[facility] LCSW Group Therapy   09/14/2016 1pm   Type of Therapy: Group Therapy   Participation Level: Active   Participation Quality: Attentive, Sharing and Supportive   Affect: Appropriate   Cognitive: Alert and Oriented   Insight: Developing/Improving and Engaged   Engagement in Therapy: Developing/Improving and Engaged   Modes of Intervention: Clarification, Confrontation, Discussion, Education, Exploration, Limit-setting, Orientation, Problem-solving, Rapport Building, Dance movement psychotherapisteality Testing, Socialization and Support   Summary of Progress/Problems: The topic for group was balance in life. Today's group focused on defining balance in one's own words, identifying things that can knock one off balance, and exploring healthy ways to maintain balance in life. Group members were asked to provide an example of a time when they felt off balance, describe how they handled that situation, and process healthier ways to regain balance in the future. Group members were asked to share the most important tool for maintaining balance [facility] and how they plan to apply this method after discharge.   Hampton AbbotKadijah Tristin Walls, MSW, LCSWA 09/14/2016, 2:22PM

## 2016-09-14 NOTE — [facility] Group Notes (Signed)
[facility] Group Notes:  (Nursing/MHT/Case Management/Adjunct)  Date:  09/14/2016  Time:  5:44 AM  Type of Therapy:  Group Therapy  Participation Level:  Active  Participation Quality:  Appropriate  Affect:  Appropriate  Cognitive:  Appropriate  Insight:  Appropriate  Engagement in Group:  Engaged  Modes of Intervention:  n/a  Summary of Progress/Problems:  Janice Walls Janice Walls Aliano 09/14/2016, 5:44 AM

## 2016-09-14 NOTE — Progress Notes (Signed)
Recreation Therapy Notes  Date: 05.17.18 Time: 9:30 am Location: Craft Room  Group Topic: Leisure Education  Goal Area(s) Addresses:  Patient will identify things they are grateful for. Patient will identify how being grateful can influence decision making.  Behavioral Response: Attentive  Intervention: LexicographerGrateful Wheel  Activity: Patients were given an I Am Grateful For worksheet and were instructed to write things they were grateful for under each category.   Education: LRT educated patient on leisure.  Education Outcome: Patient was with Dr. Ardyth HarpsHernandez when LRT educated group.  Clinical Observations/Feedback: Patient wrote things she is grateful for. Patient left group at approximately 9:45 am with Dr. Ardyth HarpsHernandez. Patient returned to group when group was ending.  [name],Amari Zagal M, LRT/CTRS 09/14/2016 10:12 AM

## 2016-09-14 NOTE — Plan of Care (Signed)
Problem: Safety: Goal: Periods of time without injury will increase Outcome: Progressing Pt safe on the unit at this time   

## 2016-09-14 NOTE — [facility] Suicide Risk Assessment (Signed)
[facility] INPATIENT:  Family/Significant Other Suicide Prevention Education  Suicide Prevention Education:  Education Completed; , Janice Walls, mother, [phone], has been identified by the patient as the family member/significant other with whom the patient will be residing, and identified as the person(s) who will aid the patient in the event of a mental health crisis (suicidal ideations/suicide attempt).  With written consent from the patient, the family member/significant other has been provided the following suicide prevention education, prior to the and/or following the discharge of the patient.  The suicide prevention education provided includes the following:  Suicide risk factors  Suicide prevention and interventions  National Suicide Hotline telephone number  [facility] assessment telephone number  [facility] Assistance 911  [facility] and/or [facility]idential [facility] telephone number  Request made of family/significant other to:  Remove weapons (e.g., guns, rifles, knives), all items previously/currently identified as safety concern.  No guns per mother.  Remove drugs/medications (over-the-counter, prescriptions, illicit drugs), all items previously/currently identified as a safety concern. Pt has not filled any medications, per mother.  The family member/significant other verbalizes understanding of the suicide prevention education information provided.  The family member/significant other agrees to remove the items of safety concern listed above.  Mother reports pt has very frequent issues and can change from being rational to irrational very quickly.  CSW discussed IVC process as well as involving the police if necessary in the future.    Lorri FrederickWierda, Janice Rosano Jon, LCSW 09/14/2016, 2:16 PM

## 2016-09-14 NOTE — Plan of Care (Signed)
Problem: [facility] Participation in Recreation Therapeutic Interventions Goal: STG-Patient will identify at least five coping skills for ** STG: Coping Skills - Within 4 treatment sessions, patient will verbalize at least 5 coping skills for anger in each of 2 treatment sessions to increase anger management skills.  Outcome: Progressing Treatment Session 1; Completed 1 out of 2: At approximately 3:45 pm, LRT met with patient in consultation room. Patient verbalized 5 coping skills for anger. Patient verbalized what triggers her to get angry, how her body responds to anger, and how she is going to remind herself to use her coping skills. LRT provided suggestions as well.  Leonette Monarch, LRT/CTRS 05.17.18 4:12 pm

## 2016-09-14 NOTE — Plan of Care (Signed)
Problem: Coping: Goal: Ability to identify and develop effective coping behavior will improve Outcome: Progressing Pt stated she had a better day, she was talking with her peers most of the day

## 2016-09-14 NOTE — Progress Notes (Signed)
[facility]  09/14/2016 12:45 PM Glenna DurandJennifer Jenison  MRN:  [id number] Subjective:  Patient is a 31 year old single Caucasian [place]. The patient has a history of bipolar disorder. She has been noncompliant with treatment.  Patient presented voluntarily to our emergency department on May 13 requesting a urine toxicology screen, patient brought her cat with her. Patient said that her mother thinks she is still using drugs when she is not.  She wanted a urine toxicology screen here so she can show her mom she was not abusing any substances.  Presentation the patient was clearly manic. Her thought process was disorganized. Her mother came to visit her and the patient became very agitated and verbally aggressive to her mother and the staff in the emergency department. The patient was discriminating yelling at staff patient tried to run up the hall and out of the area where she was. Patient started screaming that she was going to just kill herself.  During assessment today the patient was pleasant and cooperative but clearly euphoric, hyperverbal and has psychomotor agitation. The patient does not believe she is manic at this time. She denies having any problems with mood appetite, energy, sleep or concentration. She claims that she has been is sleeping 6 hours every night. Per nursing is that she slept 3 hours last night.  5/16  Pt still euphoric, significant psychomotor agitation. She is seeing pacing around the unit talking loudly on the phone and talking loudly to peers. Thought process was linear today. Denies any suicidality, homicidality or auditory or visual hallucinations. She denies any side effects from her medications. She denies any physical complaints. States that she slept very well last night  5/17 patient continues to be euphoric and displays psychomotor agitation. She says she is tolerating medications quite well. She did have an episode of vomiting this  morning but denies feeling nauseous now. She denies side effects or any physical complaints. She has been participating in groups. He has not been disruptive.  Per nursing: D: Pt denies SI/HI/AVH. Pt is pleasant and cooperative. Pt stated she was better, not depressed anymore.   A: Pt was offered support and encouragement. Pt was given scheduled medications. Pt was encourage to attend groups. Q 15 minute checks were done for safety.   R:Pt attends groups and interacts well with peers and staff. Pt is taking medication. Pt has no complaints.Pt receptive to treatment and safety maintained on unit.   Principal Problem: Bipolar affective disorder, current episode manic (HCC) Diagnosis:   Patient Active Problem List   Diagnosis Date Noted  . Bipolar affective disorder, current episode manic (HCC) [F31.9] 09/12/2016  . Tobacco use disorder [F17.200] 09/12/2016  . Cannabis use disorder, severe, dependence (HCC) [F12.20] 09/12/2016   Total Time spent with patient: 30 minutes  Past Psychiatric History: Patient has been off medications for several years. She is not currently following up with anyone. She's been diagnosed with bipolar disorder since age 89. She has had multiple prior psychiatric hospitalizations. She has been in our unit in the past in 2016. She denies any history of suicidal attempts or self injury  Past Medical History:  Past Medical History:  Diagnosis Date  . Anxiety   . Bipolar 1 disorder (HCC)   . Depression   . Insomnia   . Substance abuse     Past Surgical History:  Procedure Laterality Date  . CESAREAN SECTION    . CESAREAN SECTION     Family History:  History reviewed. No pertinent family history.   Family Psychiatric  History: Patient reports that her 27 year old daughter has bipolar disorder. Her father was an alcoholic and an die of an overdose of crystal meth  Social History: Patient is currently living by herself in [place]. She has 2 children  ages 30 years old and a 57 year old. Her 32 year old was adopted by the patient's mother into treatment-year-old is currently living with the patient's mother as well. Patient has been married in the past she is currently divorced.  History  Alcohol Use No     History  Drug Use  . Types: Marijuana    Social History   Social History  . Marital status: Single    Spouse name: N/A  . Number of children: N/A  . Years of education: N/A   Social History Main Topics  . Smoking status: Current Some Day Smoker    Packs/day: 0.50    Types: Cigarettes  . Smokeless tobacco: Never Used  . Alcohol use No  . Drug use: Yes    Types: Marijuana  . Sexual activity: Yes    Birth control/ protection: None   Other Topics Concern  . None   Social History Narrative  . None     Current Medications: Current Facility-Administered Medications  Medication Dose Route Frequency Provider Last Rate Last Dose  . acetaminophen (TYLENOL) tablet 650 mg  650 mg Oral Q6H PRN Clapacs, John T, MD      . alum & mag hydroxide-simeth (MAALOX/MYLANTA) 200-200-20 MG/5ML suspension 30 mL  30 mL Oral Q4H PRN Clapacs, Jackquline Denmark, MD      . Melene Muller ON 09/15/2016] ARIPiprazole (ABILIFY) tablet 20 mg  20 mg Oral QPC breakfast Jimmy Footman, MD      . ARIPiprazole ER SRER 400 mg  400 mg Intramuscular Q30 days Jimmy Footman, MD      . eye wash ((SODIUM/POTASSIUM/SOD CHLORIDE)) ophthalmic solution 2 drop  2 drop Both Eyes PRN Jimmy Footman, MD      . lithium carbonate (ESKALITH) CR tablet 450 mg  450 mg Oral BID Jimmy Footman, MD      . LORazepam (ATIVAN) tablet 2 mg  2 mg Oral QHS Jimmy Footman, MD   2 mg at 09/13/16 2133  . magnesium hydroxide (MILK OF MAGNESIA) suspension 30 mL  30 mL Oral Daily PRN Clapacs, Jackquline Denmark, MD        Lab Results:  No results found for this or any previous visit (from the past 48 hour(s)).  Blood Alcohol level:  Lab Results  Component  Value Date   ETH <5 12/06/2015    Metabolic Disorder Labs: Lab Results  Component Value Date   HGBA1C 4.8 09/12/2016   MPG 91 09/12/2016   Lab Results  Component Value Date   PROLACTIN 37.4 (H) 09/12/2016   Lab Results  Component Value Date   CHOL 130 09/12/2016   TRIG 79 09/12/2016   HDL 41 09/12/2016   CHOLHDL 3.2 09/12/2016   VLDL 16 09/12/2016   LDLCALC 73 09/12/2016    Physical Findings: AIMS: Facial and Oral Movements Muscles of Facial Expression: None, normal Lips and Perioral Area: None, normal Jaw: None, normal Tongue: None, normal,Extremity Movements Upper (arms, wrists, hands, fingers): None, normal Lower (legs, knees, ankles, toes): None, normal, Trunk Movements Neck, shoulders, hips: None, normal, Overall Severity Severity of abnormal movements (highest score from questions above): None, normal Incapacitation due to abnormal movements: None, normal Patient's awareness of abnormal movements (rate only  patient's report): No Awareness, Dental Status Current problems with teeth and/or dentures?: Yes (Poor dental hygiene) Does patient usually wear dentures?: No  CIWA:    COWS:     Musculoskeletal: Strength & Muscle Tone: within normal limits Gait & Station: normal Patient leans: N/A  Psychiatric Specialty Exam: Physical Exam  Constitutional: She is oriented to person, place, and time. She appears well-developed and well-nourished.  HENT:  Head: Normocephalic and atraumatic.  Eyes: Conjunctivae and EOM are normal.  Neck: Normal range of motion.  Respiratory: Effort normal.  Musculoskeletal: Normal range of motion.  Neurological: She is alert and oriented to person, place, and time.    Review of Systems  Constitutional: Negative.   HENT: Negative.   Eyes: Negative.   Respiratory: Negative.   Cardiovascular: Negative.   Gastrointestinal: Negative.   Genitourinary: Negative.   Musculoskeletal: Negative.   Skin: Negative.   Neurological: Negative.    Endo/Heme/Allergies: Negative.   Psychiatric/Behavioral: Negative for depression, hallucinations, memory loss, substance abuse and suicidal ideas. The patient is not nervous/anxious and does not have insomnia.     Blood pressure 113/77, pulse 87, temperature 98.1 F (36.7 C), resp. rate 18, height 5\' 1"  (1.549 m), weight 57.6 kg (127 lb), last menstrual period 08/27/2016, SpO2 100 %.Body mass index is 24 kg/m.  General Appearance: Fairly Groomed  Eye Contact:  Good  Speech:  Clear and Coherent  Volume:  Increased  Mood:  Euphoric  Affect:  Congruent  Thought Process:  Linear and Descriptions of Associations: Intact  Orientation:  Full (Time, Place, and Person)  Thought Content:  Hallucinations: None  Suicidal Thoughts:  No  Homicidal Thoughts:  No  Memory:  Immediate;   Fair Recent;   Fair Remote;   Fair  Judgement:  Poor  Insight:  Shallow  Psychomotor Activity:  Increased  Concentration:  Concentration: Poor and Attention Span: Poor  Recall:  Good  Fund of Knowledge:  Good  Language:  Good  Akathisia:  No  Handed:    AIMS (if indicated):     Assets:  Manufacturing systems engineer Social Support  ADL's:  Intact  Cognition:  WNL  Sleep:  Number of Hours: 7.15     Treatment Plan Summary: Daily contact with patient to assess and evaluate symptoms and progress in treatment and Medication management   Patient is a 31 year old Caucasian female with bipolar disorder type I. She is presenting manic in the setting of noncompliance with medications.  Bipolar disorder: Continue Abilify 20 mg by mouth daily and lithium CR 450 mg by mouth twice a day. Today the patient will receive Abilify injectable 400 mg IM.  For insomnia: Continue Ativan 2 mg by mouth daily at bedtime  Tobacco use disorder patient declines from receiving a nicotine patch  Substance abuse: Patient has been smoking marijuana but denies use of any other substances lately. She has been educated about the negative  effects of cannabis and other substances in mood.  Diet regular  Precautions every 15 minute checks  Hospitalization status voluntary  Disposition once a stable she will be discharged back to her home  Follow up to be determined  Labs: TSH normal, hemoglobin A1c 4.8, lipid panel within the normal limits.  Urine pregnancy negative.  Renal function within the normal limits  Patient will likely be discharged on Monday after having lithium level check.   Jimmy Footman, MD 09/14/2016, 12:45 PM

## 2016-09-14 NOTE — Progress Notes (Signed)
D: Pt denies SI/HI/AVH. Pt is pleasant and cooperative. Pt stated she had a good day. Pt had bright affect on unit. Pt was interacting with her peers most of the evening.   A: Pt was offered support and encouragement. Pt was given scheduled medications. Pt was encourage to attend groups. Q 15 minute checks were done for safety.    R:Pt attends groups and interacts well with peers and staff. Pt is taking medication. Pt has no complaints.Pt receptive to treatment and safety maintained on unit.

## 2016-09-14 NOTE — [facility] Group Notes (Signed)
Goals Group Date/Time: 09/14/2016 9:00 AM Type of Therapy and Topic: Group Therapy: Goals Group: SMART Goals   Participation Level: Moderate  Description of Group:    The purpose of a daily goals group is to assist and guide patients in setting recovery/wellness-related goals. The objective is to set goals as they relate to the crisis in which they were admitted. Patients will be using SMART goal modalities to set measurable goals. Characteristics of realistic goals will be discussed and patients will be assisted in setting and processing how one will reach their goal. Facilitator will also assist patients in applying interventions and coping skills learned in psycho-education groups to the SMART goal and process how one will achieve defined goal.   Therapeutic Goals:   -Patients will develop and document one goal related to or their crisis in which brought them into treatment.  -Patients will be guided by LCSW using SMART goal setting modality in how to set a measurable, attainable, realistic and time sensitive goal.  -Patients will process barriers in reaching goal.  -Patients will process interventions in how to overcome and successful in reaching goal.   Patient's Goal:Pt goal is to minimize temper problems and to work on her anger.   Therapeutic Modalities:  Motivational Interviewing  Research officer, political partyCognitive Behavioral Therapy  Crisis Intervention Model  SMART goals setting   Daleen SquibbGreg Amor Packard, [place]CSW

## 2016-09-15 MED ORDER — ARIPIPRAZOLE 10 MG PO TABS
30.0000 mg | ORAL_TABLET | Freq: Every day | ORAL | Status: DC
  Administered 2016-09-16 – 2016-09-18 (×3): 30 mg via ORAL
  Filled 2016-09-15 (×3): qty 3

## 2016-09-15 NOTE — Progress Notes (Signed)
Recreation Therapy Notes  Date: 05.18.18 Time: 9:30 am Location: Craft Room  Group Topic: Coping Skills  Goal Area(s) Addresses:  Patient will identify healthy coping skills. Patient will verbalize benefit of using healthy coping skills.  Behavioral Response: Attentive, Interactive  Intervention: Coping Skills Alphabet  Activity: Patients were given a Naval architectCoping Skills Alphabet worksheet and were instructed to write healthy coping skills for each letter of the alphabet.  Education: LRT educated patients on healthy coping skills.  Education Outcome: Acknowledges education/In group clarification offered  Clinical Observations/Feedback: Patient wrote healthy coping skills. Patient contributed to group discussion by stating some healthy coping skills she enjoys, what emotions she feels when she uses healthy coping skill, and how she can remind herself to use her healthy coping skills.  [name],Johnluke Haugen M, LRT/CTRS 09/15/2016 10:18 AM

## 2016-09-15 NOTE — Plan of Care (Signed)
Problem: Coping: Goal: Ability to interact with others will improve Outcome: Progressing Patient is interacting appropriately with her peers.

## 2016-09-15 NOTE — [facility] Group Notes (Signed)
[facility] LCSW Group Therapy  09/15/2016 3:19 PM  Type of Therapy:  Group Therapy  Participation Level:  Patient did not attend group. CSW invited patient to group.   Summary of Progress/Problems: Stress management: Patients defined and discussed the topic of stress and the related symptoms and triggers for stress. Patients identified healthy coping skills they would like to try during hospitalization and after discharge to manage stress in a healthy way. CSW offered insight to varying stress management techniques.   Cherice Glennie G. Garnette CzechSampson MSW, LCSWA 09/15/2016, 3:19 PM

## 2016-09-15 NOTE — Progress Notes (Signed)
D: Patient is pleasant upon approach with extremely bright affect.  Her speech is rapid and pressured.  She is hyperverbal; restless and fidgety.  Patient also has an underlying irritability at times.  She states, "my daughter has my car and won't give it back."  [facility] because "I don't know whose name is on the title."  Patient denies any thoughts of self harm.  She was observed this morning attempting to hug one of her peers and had to be told that it was inappropriate.  Patient is interacting with her peers and is engaged during group.   A: Continue to monitor medication management and MD orders.  Safety checks completed every 15 minutes per protocol.  Offer support and encouragement as needed. R: Patient is receptive to staff; her behavior is appropriate.

## 2016-09-15 NOTE — Progress Notes (Signed)
[facility]  09/15/2016 12:14 PM Janice DurandJennifer Walls  MRN:  [id number] Subjective:  Patient is a 31 year old single [place]. The patient has a history of bipolar disorder. She has been noncompliant with treatment.  Patient presented voluntarily to our emergency department on May 13 requesting a urine toxicology screen, patient brought her cat with her. Patient said that her mother thinks she is still using drugs when she is not.  She wanted a urine toxicology screen here so she can show her mom she was not abusing any substances.  Presentation the patient was clearly manic. Her thought process was disorganized. Her mother came to visit her and the patient became very agitated and verbally aggressive to her mother and the staff in the emergency department. The patient was discriminating yelling at staff patient tried to run up the hall and out of the area where she was. Patient started screaming that she was going to just kill herself.  During assessment today the patient was pleasant and cooperative but clearly euphoric, hyperverbal and has psychomotor agitation. The patient does not believe she is manic at this time. She denies having any problems with mood appetite, energy, sleep or concentration. She claims that she has been is sleeping 6 hours every night. Per nursing is that she slept 3 hours last night.  5/16  Pt still euphoric, significant psychomotor agitation. She is seeing pacing around the unit talking loudly on the phone and talking loudly to peers. Thought process was linear today. Denies any suicidality, homicidality or auditory or visual hallucinations. She denies any side effects from her medications. She denies any physical complaints. States that she slept very well last night  5/17 patient continues to be euphoric and displays psychomotor agitation. She says she is tolerating medications quite well. She did have an episode of vomiting this  morning but denies feeling nauseous now. She denies side effects or any physical complaints. She has been participating in groups. He has not been disruptive.  5/18 patient's mood was labile today. She was smiling and in crying. Says that she is upset with her mother. Other than that she feels she is doing well with the medications. She says she is sleeping well. Denies side effects or physical complaints. Denies problems with mood, sleep, energy, appetite or concentration. Denies suicidality, homicidality or auditory or visual hallucinations.  There is staff continues to euphoric, they flirtatious  with female peers  Per nursing: D: Pt denies SI/HI/AVH. Pt is pleasant and cooperative. Pt stated she had a good day. Pt had bright affect on unit. Pt was interacting with her peers most of the evening.   A: Pt was offered support and encouragement. Pt was given scheduled medications. Pt was encourage to attend groups. Q 15 minute checks were done for safety.    R:Pt attends groups and interacts well with peers and staff. Pt is taking medication. Pt has no complaints.Pt receptive to treatment and safety maintained on unit.   Principal Problem: Bipolar affective disorder, current episode manic (HCC) Diagnosis:   Patient Active Problem List   Diagnosis Date Noted  . Bipolar affective disorder, current episode manic (HCC) [F31.9] 09/12/2016  . Tobacco use disorder [F17.200] 09/12/2016  . Cannabis use disorder, severe, dependence (HCC) [F12.20] 09/12/2016   Total Time spent with patient: 30 minutes  Past Psychiatric History: Patient has been off medications for several years. She is not currently following up with anyone. She's been diagnosed with bipolar disorder since  age 77. She has had multiple prior psychiatric hospitalizations. She has been in our unit in the past in 2016. She denies any history of suicidal attempts or self injury  Past Medical History:  Past Medical History:  Diagnosis  Date  . Anxiety   . Bipolar 1 disorder (HCC)   . Depression   . Insomnia   . Substance abuse     Past Surgical History:  Procedure Laterality Date  . CESAREAN SECTION    . CESAREAN SECTION     Family History: History reviewed. No pertinent family history.   Family Psychiatric  History: Patient reports that her 54 year old daughter has bipolar disorder. Her father was an alcoholic and an die of an overdose of crystal meth  Social History: Patient is currently living by herself in [place]. She has 2 children ages 66 years old and a 54 year old. Her 32 year old was adopted by the patient's mother into treatment-year-old is currently living with the patient's mother as well. Patient has been married in the past she is currently divorced.  History  Alcohol Use No     History  Drug Use  . Types: Marijuana    Social History   Social History  . Marital status: Single    Spouse name: N/A  . Number of children: N/A  . Years of education: N/A   Social History Main Topics  . Smoking status: Current Some Day Smoker    Packs/day: 0.50    Types: Cigarettes  . Smokeless tobacco: Never Used  . Alcohol use No  . Drug use: Yes    Types: Marijuana  . Sexual activity: Yes    Birth control/ protection: None   Other Topics Concern  . None   Social History Narrative  . None     Current Medications: Current Facility-Administered Medications  Medication Dose Route Frequency Provider Last Rate Last Dose  . acetaminophen (TYLENOL) tablet 650 mg  650 mg Oral Q6H PRN Clapacs, John T, MD      . alum & mag hydroxide-simeth (MAALOX/MYLANTA) 200-200-20 MG/5ML suspension 30 mL  30 mL Oral Q4H PRN Clapacs, Jackquline Denmark, MD      . Melene Muller ON 09/16/2016] ARIPiprazole (ABILIFY) tablet 30 mg  30 mg Oral QPC breakfast Jimmy Footman, MD      . ARIPiprazole ER SRER 400 mg  400 mg Intramuscular Q30 days Jimmy Footman, MD   400 mg at 09/14/16 1354  . eye wash  ((SODIUM/POTASSIUM/SOD CHLORIDE)) ophthalmic solution 2 drop  2 drop Both Eyes PRN Jimmy Footman, MD      . lithium carbonate (ESKALITH) CR tablet 450 mg  450 mg Oral BID Jimmy Footman, MD   450 mg at 09/15/16 4098  . LORazepam (ATIVAN) tablet 2 mg  2 mg Oral QHS Jimmy Footman, MD   2 mg at 09/14/16 2146  . magnesium hydroxide (MILK OF MAGNESIA) suspension 30 mL  30 mL Oral Daily PRN Clapacs, Jackquline Denmark, MD        Lab Results:  No results found for this or any previous visit (from the past 48 hour(s)).  Blood Alcohol level:  Lab Results  Component Value Date   ETH <5 12/06/2015    Metabolic Disorder Labs: Lab Results  Component Value Date   HGBA1C 4.8 09/12/2016   MPG 91 09/12/2016   Lab Results  Component Value Date   PROLACTIN 37.4 (H) 09/12/2016   Lab Results  Component Value Date   CHOL 130 09/12/2016   TRIG 79 09/12/2016  HDL 41 09/12/2016   CHOLHDL 3.2 09/12/2016   VLDL 16 09/12/2016   LDLCALC 73 09/12/2016    Physical Findings: AIMS: Facial and Oral Movements Muscles of Facial Expression: None, normal Lips and Perioral Area: None, normal Jaw: None, normal Tongue: None, normal,Extremity Movements Upper (arms, wrists, hands, fingers): None, normal Lower (legs, knees, ankles, toes): None, normal, Trunk Movements Neck, shoulders, hips: None, normal, Overall Severity Severity of abnormal movements (highest score from questions above): None, normal Incapacitation due to abnormal movements: None, normal Patient's awareness of abnormal movements (rate only patient's report): No Awareness, Dental Status Current problems with teeth and/or dentures?: Yes (Poor dental hygiene) Does patient usually wear dentures?: No  CIWA:    COWS:     Musculoskeletal: Strength & Muscle Tone: within normal limits Gait & Station: normal Patient leans: N/A  Psychiatric Specialty Exam: Physical Exam  Constitutional: She is oriented to person,  place, and time. She appears well-developed and well-nourished.  HENT:  Head: Normocephalic and atraumatic.  Eyes: Conjunctivae and EOM are normal.  Neck: Normal range of motion.  Respiratory: Effort normal.  Musculoskeletal: Normal range of motion.  Neurological: She is alert and oriented to person, place, and time.    Review of Systems  Constitutional: Negative.   HENT: Negative.   Eyes: Negative.   Respiratory: Negative.   Cardiovascular: Negative.   Gastrointestinal: Negative.   Genitourinary: Negative.   Musculoskeletal: Negative.   Skin: Negative.   Neurological: Negative.   Endo/Heme/Allergies: Negative.   Psychiatric/Behavioral: Negative for depression, hallucinations, memory loss, substance abuse and suicidal ideas. The patient is not nervous/anxious and does not have insomnia.     Blood pressure (!) 130/102, pulse 94, temperature 98.2 F (36.8 C), temperature source Oral, resp. rate 17, height 5\' 1"  (1.549 m), weight 57.6 kg (127 lb), last menstrual period 08/27/2016, SpO2 99 %.Body mass index is 24 kg/m.  General Appearance: Fairly Groomed  Eye Contact:  Good  Speech:  Clear and Coherent  Volume:  Increased  Mood:  Euphoric  Affect:  Congruent  Thought Process:  Linear and Descriptions of Associations: Intact  Orientation:  Full (Time, Place, and Person)  Thought Content:  Hallucinations: None  Suicidal Thoughts:  No  Homicidal Thoughts:  No  Memory:  Immediate;   Fair Recent;   Fair Remote;   Fair  Judgement:  Poor  Insight:  Shallow  Psychomotor Activity:  Increased  Concentration:  Concentration: Poor and Attention Span: Poor  Recall:  Good  Fund of Knowledge:  Good  Language:  Good  Akathisia:  No  Handed:    AIMS (if indicated):     Assets:  Manufacturing systems engineer Social Support  ADL's:  Intact  Cognition:  WNL  Sleep:  Number of Hours: 6.3     Treatment Plan Summary: Daily contact with patient to assess and evaluate symptoms and progress in  treatment and Medication management   Patient is a 31 year old Caucasian female with bipolar disorder type I. She is presenting manic in the setting of noncompliance with medications.  Bipolar disorder: Continue Abilify but will increase to 30 mgby mouth daily and lithium CR 450 mg by mouth twice a day. Received Abilify injectable 400 mg IM on 5/17.  For insomnia: Continue Ativan 2 mg by mouth daily at bedtime  Tobacco use disorder patient declines from receiving a nicotine patch  Substance abuse: Patient has been smoking marijuana but denies use of any other substances lately. She has been educated about the negative effects of cannabis  and other substances in mood.  Diet regular  Precautions every 15 minute checks  Hospitalization status voluntary  Disposition once a stable she will be discharged back to her home  Follow up to be determined  Labs: TSH normal, hemoglobin A1c 4.8, lipid panel within the normal limits.  Urine pregnancy negative.  Renal function within the normal limits  Patient will likely be discharged on Monday after having lithium level check. Please order lithium level for Monday am on Sunday.   Jimmy Footman, MD 09/15/2016, 12:14 PM

## 2016-09-16 DIAGNOSIS — F1220 Cannabis dependence, uncomplicated: Secondary | ICD-10-CM

## 2016-09-16 NOTE — Progress Notes (Signed)
Pt required redirection multiple times for inappropriately touching female peers. As soon as redirected, pt requests to pull nurse to the side to talk and goes on about why she is touching. Redirectable at the time, but continues same behaviors. Pt is somewhat manipulative. Appears to be less manic, more organized with thoughts, but does still voice some disorganization when interacting with this nurse by getting off topic and not remembering what she is about to say. Tearful regarding letters brought in by family from incarcerated friends "who might be getting 15 years." Pt is medication compliant. Labile. Denies SI/HI/AVH, pain. Reports good sleep last night, good appetite, normal energy, good concentration. Rates depression 0/10, hopelessness 0/10, anxiety 0/10 (low 0-10 high). Goal today is "my release" by "classes and talking." Support and encouragement provided. Medications administered as ordered with education. Safety maintained with every 15 minute checks. Will continue to monitor.

## 2016-09-16 NOTE — Progress Notes (Signed)
Patient was polite and friendly at the beginning of the shift. She approached Clinical research associatewriter at 2025 and asked for her "bedtime medications".  Patient was resistant to waiting until 2100 and became tearful while describing "severe anxiety".  She also became tearful.  She was given her scheduled medications and stated she was going to rest.  She was seen walking around the unit and drinking coffee or sitting with the Patient group and laughing.  At one point, she was tearful and sad, and within minutes could be laughing.  Mood is labile this evening.  At 0100, she was observed in the hall and when approached, she asked "could we go to the snack machine?".  She was offered ice water and refused.  She returned to bed.

## 2016-09-16 NOTE — Progress Notes (Addendum)
[facility] MD Progress Note  09/16/2016 12:13 PM Janice Walls  MRN:  [id number] Subjective:  Patient is a 31 year old single Caucasian female from it in [place]. The patient has a history of bipolar disorder. She has been noncompliant with treatment.  Patient presented voluntarily to our emergency department on May 13 requesting a urine toxicology screen, patient brought her cat with her. Patient said that her mother thinks she is still using drugs when she is not.  She wanted a urine toxicology screen here so she can show her mom she was not abusing any substances.  Presentation the patient was clearly manic. Her thought process was disorganized. Her mother came to visit her and the patient became very agitated and verbally aggressive to her mother and the staff in the emergency department. The patient was discriminating yelling at staff patient tried to run up the hall and out of the area where she was. Patient started screaming that she was going to just kill herself.  During assessment today the patient was pleasant and cooperative but clearly euphoric, hyperverbal and has psychomotor agitation. The patient does not believe she is manic at this time. She denies having any problems with mood appetite, energy, sleep or concentration. She claims that she has been is sleeping 6 hours every night. Per nursing is that she slept 3 hours last night.  5/16  Pt still euphoric, significant psychomotor agitation. She is seeing pacing around the unit talking loudly on the phone and talking loudly to peers. Thought process was linear today. Denies any suicidality, homicidality or auditory or visual hallucinations. She denies any side effects from her medications. She denies any physical complaints. States that she slept very well last night  5/17 patient continues to be euphoric and displays psychomotor agitation. She says she is tolerating medications quite well. She did have an episode of vomiting this  morning but denies feeling nauseous now. She denies side effects or any physical complaints. She has been participating in groups. He has not been disruptive.  5/18 patient's mood was labile today. She was smiling and in crying. Says that she is upset with her mother. Other than that she feels she is doing well with the medications. She says she is sleeping well. Denies side effects or physical complaints. Denies problems with mood, sleep, energy, appetite or concentration. Denies suicidality, homicidality or auditory or visual hallucinations.  There is staff continues to euphoric, they flirtatious  with female peers  09/16/16:  The patient reports that she is feeling better in terms of her mood. She says her mind is not racing however nursing does report that she was labile in the evening yesterday. She as she is feeling sad and tearful because it is the anniversary of the death of 2 friends of hers that overdose. She wants to be able to stay clean from the marijuana and is considering pursuing a substance abuse treatment program at [facility]. She denies any current active or passive suicidal thoughts or psychotic symptoms. She was tearful at times alternating with laughing and being joyful. Speech is not pressured and she was easily redirected. Thought processes were more organized and she was not having problems with flight of ideas. She denies any problems with insomnia and slept 6 hours last night. She denies any change in appetite, weight gain or weight loss. The patient has been social on the unit and no behavioral disturbances. She denies any somatic complaints.  Labs reviewed with the patient including elevated liver enzymes and the  need to follow up with a PCP after discharge for proper treatment for hepatitis C. She was also encouraged to enter a meaningful recovery program after discharge.  Supportive psychotherapy provided and time spent in grief counseling. Times spent discussing anger and guilt she  feels over her friend's death.     Principal Problem: Bipolar affective disorder, current episode manic (HCC) Diagnosis:   Patient Active Problem List   Diagnosis Date Noted  . Bipolar affective disorder, current episode manic (HCC) [F31.9] 09/12/2016  . Tobacco use disorder [F17.200] 09/12/2016  . Cannabis use disorder, severe, dependence (HCC) [F12.20] 09/12/2016   Total Time spent with patient: 30 minutes  Past Psychiatric History: Patient has been off medications for several years. She is not currently following up with anyone. She's been diagnosed with bipolar disorder since age 25. She has had multiple prior psychiatric hospitalizations. She has been in our unit in the past in 2016. She denies any history of suicidal attempts or self injury  Past Medical History:  Past Medical History:  Diagnosis Date  . Anxiety   . Bipolar 1 disorder (HCC)   . Depression   . Insomnia   . Substance abuse     Past Surgical History:  Procedure Laterality Date  . CESAREAN SECTION    . CESAREAN SECTION     Family History: History reviewed. No pertinent family history.   Family Psychiatric  History: Patient reports that her 68 year old daughter has bipolar disorder. Her father was an alcoholic and an die of an overdose of crystal meth  Social History: Patient is currently living by herself in [place]. She has 2 children ages 50 years old and a 65 year old. Her 32 year old was adopted by the patient's mother into treatment-year-old is currently living with the patient's mother as well. Patient has been married in the past she is currently divorced.  History  Alcohol Use No     History  Drug Use  . Types: Marijuana    Social History   Social History  . Marital status: Single    Spouse name: N/A  . Number of children: N/A  . Years of education: N/A   Social History Main Topics  . Smoking status: Current Some Day Smoker    Packs/day: 0.50    Types: Cigarettes  . Smokeless  tobacco: Never Used  . Alcohol use No  . Drug use: Yes    Types: Marijuana  . Sexual activity: Yes    Birth control/ protection: None   Other Topics Concern  . None   Social History Narrative  . None     Current Medications: Current Facility-Administered Medications  Medication Dose Route Frequency Provider Last Rate Last Dose  . acetaminophen (TYLENOL) tablet 650 mg  650 mg Oral Q6H PRN Clapacs, John T, MD      . alum & mag hydroxide-simeth (MAALOX/MYLANTA) 200-200-20 MG/5ML suspension 30 mL  30 mL Oral Q4H PRN Clapacs, John T, MD      . ARIPiprazole (ABILIFY) tablet 30 mg  30 mg Oral QPC breakfast Jimmy Footman, MD   30 mg at 09/16/16 0824  . ARIPiprazole ER SRER 400 mg  400 mg Intramuscular Q30 days Jimmy Footman, MD   400 mg at 09/14/16 1354  . eye wash ((SODIUM/POTASSIUM/SOD CHLORIDE)) ophthalmic solution 2 drop  2 drop Both Eyes PRN Jimmy Footman, MD      . lithium carbonate (ESKALITH) CR tablet 450 mg  450 mg Oral BID Jimmy Footman, MD   450 mg at  09/16/16 0824  . LORazepam (ATIVAN) tablet 2 mg  2 mg Oral QHS Jimmy FootmanHernandez-Gonzalez, Andrea, MD   2 mg at 09/15/16 2037  . magnesium hydroxide (MILK OF MAGNESIA) suspension 30 mL  30 mL Oral Daily PRN Clapacs, Jackquline DenmarkJohn T, MD        Lab Results:  No results found for this or any previous visit (from the past 48 hour(s)).  Blood Alcohol level:  Lab Results  Component Value Date   ETH <5 12/06/2015    Metabolic Disorder Labs: Lab Results  Component Value Date   HGBA1C 4.8 09/12/2016   MPG 91 09/12/2016   Lab Results  Component Value Date   PROLACTIN 37.4 (H) 09/12/2016   Lab Results  Component Value Date   CHOL 130 09/12/2016   TRIG 79 09/12/2016   HDL 41 09/12/2016   CHOLHDL 3.2 09/12/2016   VLDL 16 09/12/2016   LDLCALC 73 09/12/2016    Physical Findings: AIMS: Facial and Oral Movements Muscles of Facial Expression: None, normal Lips and Perioral Area: None,  normal Jaw: None, normal Tongue: None, normal,Extremity Movements Upper (arms, wrists, hands, fingers): None, normal Lower (legs, knees, ankles, toes): None, normal, Trunk Movements Neck, shoulders, hips: None, normal, Overall Severity Severity of abnormal movements (highest score from questions above): None, normal Incapacitation due to abnormal movements: None, normal Patient's awareness of abnormal movements (rate only patient's report): No Awareness, Dental Status Current problems with teeth and/or dentures?: Yes (Poor dental hygiene) Does patient usually wear dentures?: No  CIWA:    COWS:     Musculoskeletal: Strength & Muscle Tone: within normal limits Gait & Station: normal Patient leans: N/A  Psychiatric Specialty Exam: Physical Exam  Constitutional: She is oriented to person, place, and time. She appears well-developed and well-nourished.  HENT:  Head: Normocephalic and atraumatic.  Eyes: Conjunctivae and EOM are normal.  Neck: Normal range of motion.  Respiratory: Effort normal.  Musculoskeletal: Normal range of motion.  Neurological: She is alert and oriented to person, place, and time.    Review of Systems  Constitutional: Negative.   HENT: Negative.   Eyes: Negative.   Respiratory: Negative.   Cardiovascular: Negative.   Gastrointestinal: Negative.   Genitourinary: Negative.   Musculoskeletal: Negative.   Skin: Negative.   Neurological: Negative.   Endo/Heme/Allergies: Negative.     Blood pressure 121/86, pulse 97, temperature 98.6 F (37 C), temperature source Oral, resp. rate 18, height 5\' 1"  (1.549 m), weight 57.6 kg (127 lb), last menstrual period 08/27/2016, SpO2 99 %.Body mass index is 24 kg/m.  General Appearance: Fairly Groomed  Eye Contact:  Good  Speech:  Clear and Coherent  Volume:  Increased  Mood:  "I feel good"  Affect:  Calmer and not labile or as euphoric  Thought Process:  Linear and Descriptions of Associations: Intact  Orientation:   Full (Time, Place, and Person)  Thought Content:  Hallucinations: None  Suicidal Thoughts:  No  Homicidal Thoughts:  No  Memory:  Immediate;   Fair Recent;   Fair Remote;   Fair  Judgement:  Poor  Insight:  Shallow  Psychomotor Activity:  Increased  Concentration:  Concentration: Poor and Attention Span: Poor  Recall:  Good  Fund of Knowledge:  Good  Language:  Good  Akathisia:  No  Handed:    AIMS (if indicated):     Assets:  Communication Skills Social Support  ADL's:  Intact  Cognition:  WNL  Sleep:  Number of Hours: 6.15  Treatment Plan Summary: Daily contact with patient to assess and evaluate symptoms and progress in treatment and Medication management   Patient is a 31 year old Caucasian female with bipolar disorder type I. She is presenting manic in the setting of noncompliance with medications.  Bipolar disorder: Continue Abilify now at 30 mgby mouth daily and lithium CR 450 mg by mouth twice a day. Received Abilify injectable 400 mg IM on 5/17. Will check Lithium level and CMP on Monday.  For insomnia: Continue Ativan 2 mg by mouth daily at bedtime. Will taper off at discharge as there is a long history of benzo abuse in the past. Consider switch to Trazodone  Tobacco use disorder patient declines from receiving a nicotine patch  Substance abuse: Patient has been smoking marijuana but denies use of any other substances lately. She also has a history of benzo abuse. She has been educated about the negative effects of cannabis and other substances in mood. She was advised to abstain from all illicit drugs and alcohol as they may worsen mood symptoms. The patient was encouraged and her meaningful recovery program at the time of discharge but currently is resistant.  Elevated LFTs: She has Hepatitis C. Will recheck LFTs on Monday with Lithium check. The patient has been made aware of the need to get treatment for hepatitis C. Follow-up appointment at [facility]  will be scheduled after discharge.  Diet regular  Precautions every 15 minute checks  Hospitalization status voluntary  Disposition once a stable she will be discharged back to her home. She will need psychotropic medication management follow-up appointment. The patient also needs to follow up at [facility].  Labs: TSH normal, hemoglobin A1c 4.8, lipid panel within the normal limits.  Urine pregnancy negative.  Renal function within the normal limits  Patient will likely be discharged on Monday .   Levora Angel, MD 09/16/2016, 12:13 PM

## 2016-09-16 NOTE — Plan of Care (Signed)
Problem: Coping: Goal: Ability to identify and develop effective coping behavior will improve Outcome: Not Progressing Patient has demonstrated a labile mood this evening.  She requires frequent redirection and support.

## 2016-09-16 NOTE — [facility] Group Notes (Signed)
[facility] LCSW Group Therapy  09/16/2016 3:25 PM  Type of Therapy:  Group Therapy  Participation Level:  Active  Participation Quality:  Attentive  Affect:  Appropriate  Cognitive:  Alert and Disorganized  Insight:  Limited  Engagement in Therapy:  Limited  Modes of Intervention:  Activity, Clarification, Discussion, Education, Exploration, Limit-setting, Problem-solving, Reality Testing, Socialization and Support  Summary of Progress/Problems: Coping Skills: Patients defined and discussed healthy coping skills. Patients identified healthy coping skills they would like to try during hospitalization and after discharge. CSW offered insight to varying coping skills that may have been new to patients such as practicing mindfulness.  Wilburt Messina G. Garnette CzechSampson MSW, LCSWA 09/16/2016, 3:25 PM

## 2016-09-16 NOTE — Plan of Care (Signed)
Problem: Education: Goal: Emotional status will improve Outcome: Progressing Less manic, but continues to be labile with pressured speech. Inappropriately touches female peers. Support and encouragement provided.

## 2016-09-17 NOTE — Progress Notes (Signed)
Pt irritable this morning, tearful. When this nurse woke her for breakfast, thoughts were disorganized. Became attention seeking and upset/crying regarding a peer being called into the medication room by another nurse before she was called to come in this morning. Required redirection and requested PRN anxiety medication that is not available due to becoming upset. No issues noted thereafter. Pt later wrote the nurses a note about how thankful she is for the care provided. Pt is labile, manipulative, attempts to split staff. Denies SI/HI/AVH, pain. Reports good sleep last night, good appetite, high energy, good concentration. Rates depression 0/10, hopelessness 0/10, anxiety 0/10 (low 0-10 high). Goal today is "being released, and getting my clothes clean" by "go to all classes-not get angry, ask questions." Pt is medication compliant. Support and encouragement provided. Medications administered as ordered with education. Safety maintained with every 15 minute checks. Will continue to monitor.

## 2016-09-17 NOTE — Progress Notes (Signed)
[facility] MD Progress Note  09/17/2016 2:59 PM Janice Walls  MRN:  [id number] Subjective:  Patient is a 31 year old single Caucasian female from it in [place]. The patient has a history of bipolar disorder. She has been noncompliant with treatment.  Patient presented voluntarily to our emergency department on May 13 requesting a urine toxicology screen, patient brought her cat with her. Patient said that her mother thinks she is still using drugs when she is not.  She wanted a urine toxicology screen here so she can show her mom she was not abusing any substances.  Presentation the patient was clearly manic. Her thought process was disorganized. Her mother came to visit her and the patient became very agitated and verbally aggressive to her mother and the staff in the emergency department. The patient was discriminating yelling at staff patient tried to run up the hall and out of the area where she was. Patient started screaming that she was going to just kill herself.  During assessment today the patient was pleasant and cooperative but clearly euphoric, hyperverbal and has psychomotor agitation. The patient does not believe she is manic at this time. She denies having any problems with mood appetite, energy, sleep or concentration. She claims that she has been is sleeping 6 hours every night. Per nursing is that she slept 3 hours last night.  5/16  Pt still euphoric, significant psychomotor agitation. She is seeing pacing around the unit talking loudly on the phone and talking loudly to peers. Thought process was linear today. Denies any suicidality, homicidality or auditory or visual hallucinations. She denies any side effects from her medications. She denies any physical complaints. States that she slept very well last night  5/17 patient continues to be euphoric and displays psychomotor agitation. She says she is tolerating medications quite well. She did have an episode of vomiting this  morning but denies feeling nauseous now. She denies side effects or any physical complaints. She has been participating in groups. He has not been disruptive.  5/18 patient's mood was labile today. She was smiling and in crying. Says that she is upset with her mother. Other than that she feels she is doing well with the medications. She says she is sleeping well. Denies side effects or physical complaints. Denies problems with mood, sleep, energy, appetite or concentration. Denies suicidality, homicidality or auditory or visual hallucinations.  There is staff continues to euphoric, they flirtatious  with female peers  09/16/16:The patient reports that she is feeling better in terms of her mood. She says her mind is not racing however nursing does report that she was labile in the evening yesterday. She as she is feeling sad and tearful because it is the anniversary of the death of 2 friends of hers that overdose. She wants to be able to stay clean from the marijuana and is considering pursuing a substance abuse treatment program at [facility]. She denies any current active or passive suicidal thoughts or psychotic symptoms. She was tearful at times alternating with laughing and being joyful. Speech is not pressured and she was easily redirected. Thought processes were more organized and she was not having problems with flight of ideas. She denies any problems with insomnia and slept 6 hours last night. She denies any change in appetite, weight gain or weight loss. The patient has been social on the unit and no behavioral disturbances. She denies any somatic complaints.  Labs reviewed with the patient including elevated liver enzymes and the need to  follow up with a PCP after discharge for proper treatment for hepatitis C. She was also encouraged to enter a meaningful recovery program after discharge.  Supportive psychotherapy provided and time spent in grief counseling. Times spent discussing anger and guilt she  feels over her friend's death.   5:20/18:The patient is still mildly labile today. She was tearful at one point and then laughing appropriately at another point. Yesterday evening, the patient got upset with the nurse and mildly hostile. No violent behavior. She has been somewhat inappropriate with female peers and was hugging a female peer. She denies that she was doing anything sexually inappropriate and was just trying to provide support. Times spent redirecting the patient and educating her the physical interaction is all out between patients. She denies any racing thoughts and says her mood is "good". She is anxious for discharge. She denies any current active or passive suicidal thoughts or hallucinations. She has been compliant with psychotropic medications and denies any physical adverse side effects associated with the medication. So far, she is sleeping fairly well and denies any change in appetite, weight gain or weight loss. She is attending groups regularly. She is looking forward to discharge and is willing to pursue substance abuse treatment [facility] after discharge.   Principal Problem: Bipolar affective disorder, current episode manic (HCC) Diagnosis:   Patient Active Problem List   Diagnosis Date Noted  . Bipolar affective disorder, current episode manic (HCC) [F31.9] 09/12/2016  . Tobacco use disorder [F17.200] 09/12/2016  . Cannabis use disorder, severe, dependence (HCC) [F12.20] 09/12/2016   Total Time spent with patient: 30 minutes  Past Psychiatric History: Patient has been off medications for several years. She is not currently following up with anyone. She's been diagnosed with bipolar disorder since age 31. She has had multiple prior psychiatric hospitalizations. She has been in our unit in the past in 2016. She denies any history of suicidal attempts or self injury  Past Medical History:  Past Medical History:  Diagnosis Date  . Anxiety   . Bipolar 1 disorder (HCC)   .  Depression   . Insomnia   . Substance abuse     Past Surgical History:  Procedure Laterality Date  . CESAREAN SECTION    . CESAREAN SECTION     Family History: History reviewed. No pertinent family history.   Family Psychiatric  History: Patient reports that her 883 year old daughter has bipolar disorder. Her father was an alcoholic and an die of an overdose of crystal meth  Social History: [place]. She has 2 children ages 32 years old and a 803 year old. Her 32 year old was adopted by the patient's mother into treatment-year-old is currently living with the patient's mother as well. Patient has been married in the past she is currently divorced.  History  Alcohol Use No     History  Drug Use  . Types: Marijuana    Social History   Social History  . Marital status: Single    Spouse name: N/A  . Number of children: N/A  . Years of education: N/A   Social History Main Topics  . Smoking status: Current Some Day Smoker    Packs/day: 0.50    Types: Cigarettes  . Smokeless tobacco: Never Used  . Alcohol use No  . Drug use: Yes    Types: Marijuana  . Sexual activity: Yes    Birth control/ protection: None   Other Topics Concern  . None  Social History Narrative  . None     Current Medications: Current Facility-Administered Medications  Medication Dose Route Frequency Provider Last Rate Last Dose  . acetaminophen (TYLENOL) tablet 650 mg  650 mg Oral Q6H PRN Clapacs, John T, MD      . alum & mag hydroxide-simeth (MAALOX/MYLANTA) 200-200-20 MG/5ML suspension 30 mL  30 mL Oral Q4H PRN Clapacs, John T, MD      . ARIPiprazole (ABILIFY) tablet 30 mg  30 mg Oral QPC breakfast Jimmy Footman, MD   30 mg at 09/17/16 0753  . ARIPiprazole ER SRER 400 mg  400 mg Intramuscular Q30 days Jimmy Footman, MD   400 mg at 09/14/16 1354  . eye wash ((SODIUM/POTASSIUM/SOD CHLORIDE)) ophthalmic solution 2 drop  2  drop Both Eyes PRN Jimmy Footman, MD      . lithium carbonate (ESKALITH) CR tablet 450 mg  450 mg Oral BID Jimmy Footman, MD   450 mg at 09/17/16 0753  . LORazepam (ATIVAN) tablet 2 mg  2 mg Oral QHS Jimmy Footman, MD   2 mg at 09/16/16 2103  . magnesium hydroxide (MILK OF MAGNESIA) suspension 30 mL  30 mL Oral Daily PRN Clapacs, Jackquline Denmark, MD        Lab Results:  No results found for this or any previous visit (from the past 48 hour(s)).  Blood Alcohol level:  Lab Results  Component Value Date   ETH <5 12/06/2015    Metabolic Disorder Labs: Lab Results  Component Value Date   HGBA1C 4.8 09/12/2016   MPG 91 09/12/2016   Lab Results  Component Value Date   PROLACTIN 37.4 (H) 09/12/2016   Lab Results  Component Value Date   CHOL 130 09/12/2016   TRIG 79 09/12/2016   HDL 41 09/12/2016   CHOLHDL 3.2 09/12/2016   VLDL 16 09/12/2016   LDLCALC 73 09/12/2016    Physical Findings: AIMS: Facial and Oral Movements Muscles of Facial Expression: None, normal Lips and Perioral Area: None, normal Jaw: None, normal Tongue: None, normal,Extremity Movements Upper (arms, wrists, hands, fingers): None, normal Lower (legs, knees, ankles, toes): None, normal, Trunk Movements Neck, shoulders, hips: None, normal, Overall Severity Severity of abnormal movements (highest score from questions above): None, normal Incapacitation due to abnormal movements: None, normal Patient's awareness of abnormal movements (rate only patient's report): No Awareness, Dental Status Current problems with teeth and/or dentures?: Yes (Poor dental hygiene) Does patient usually wear dentures?: No  CIWA:    COWS:     Musculoskeletal: Strength & Muscle Tone: within normal limits Gait & Station: normal Patient leans: N/A  Psychiatric Specialty Exam: Physical Exam  Constitutional: She is oriented to person, place, and time. She appears well-developed and well-nourished.   HENT:  Head: Normocephalic and atraumatic.  Eyes: Conjunctivae and EOM are normal.  Neck: Normal range of motion.  Respiratory: Effort normal.  Musculoskeletal: Normal range of motion.  Neurological: She is alert and oriented to person, place, and time.    Review of Systems  Constitutional: Negative.   HENT: Negative.   Eyes: Negative.   Respiratory: Negative.   Cardiovascular: Negative.   Gastrointestinal: Negative.   Genitourinary: Negative.   Musculoskeletal: Negative.   Skin: Negative.   Neurological: Negative.   Endo/Heme/Allergies: Negative.     Blood pressure 114/73, pulse 69, temperature 98.6 F (37 C), temperature source Oral, resp. rate 18, height 5\' 1"  (1.549 m), weight 57.6 kg (127 lb), last menstrual period 08/27/2016, SpO2 99 %.Body mass index is 24  kg/m.  General Appearance: Fairly Groomed  Eye Contact:  Good  Speech:  Clear and Coherent  Volume:  Increased  Mood:  "I feel good"  Affect:  Still mildly labile and tearful  Thought Process:  Linear and Descriptions of Associations: Intact  Orientation:  Full (Time, Place, and Person)  Thought Content:  Hallucinations: None  Suicidal Thoughts:  No  Homicidal Thoughts:  No  Memory:  Immediate;   Fair Recent;   Fair Remote;   Fair  Judgement:  Poor  Insight:  Shallow  Psychomotor Activity:  Increased  Concentration:  Concentration: Poor and Attention Span: Poor  Recall:  Good  Fund of Knowledge:  Good  Language:  Good  Akathisia:  No  Handed:    AIMS (if indicated):     Assets:  Manufacturing systems engineer Social Support  ADL's:  Intact  Cognition:  WNL  Sleep:  Number of Hours: 7.45     Treatment Plan Summary: Daily contact with patient to assess and evaluate symptoms and progress in treatment and Medication management   Patient is a 31 year old Caucasian female with bipolar disorder type I. She is presenting manic in the setting of noncompliance with medications.  Bipolar disorder: Continue Abilify  now at 30 mgby mouth daily and lithium CR 450 mg by mouth twice a day. Received Abilify injectable 400 mg IM on 5/17. Will check Lithium level and CMP on Monday.  For insomnia: Continue Ativan 2 mg by mouth daily at bedtime. Consider switch to Trazodone at discharge as she has a long standing history of benzo abuse.  Tobacco use disorder patient declines from receiving a nicotine patch  Substance abuse: Patient has been smoking marijuana but denies use of any other substances lately. She also has a history of benzo abuse. She has been educated about the negative effects of cannabis and other substances in mood. She was advised to abstain from all illicit drugs and alcohol as they may worsen mood symptoms and liver disease. The patient was encouraged and her meaningful recovery program at the time of discharge but currently is resistant.  Elevated LFTs: She has Hepatitis C. Will recheck LFTs on Monday with Lithium check. The patient has been made aware of the need to get treatment for hepatitis C. Follow-up appointment at [facility] will be scheduled after discharge. The patient was advised to abstain from alcohol as it may worsen liver disease.  Diet regular  Precautions every 15 minute checks  Hospitalization status voluntary  Disposition once a stable she will be discharged back to her home. She will need psychotropic medication management follow-up appointment. The patient also needs to follow up at [facility] clinic.  Labs: TSH normal, hemoglobin A1c 4.8, lipid panel within the normal limits.  Urine pregnancy negative.  Renal function within the normal limits  Patient will likely be discharged on Monday .   Levora Angel, MD 09/17/2016, 2:59 PM

## 2016-09-17 NOTE — Plan of Care (Signed)
Problem: Coping: Goal: Ability to identify and develop effective coping behavior will improve Outcome: Not Progressing Patient demonstrates irritability and a labile mood.

## 2016-09-17 NOTE — Plan of Care (Signed)
Problem: Coping: Goal: Ability to verbalize frustrations and anger appropriately will improve Outcome: Progressing Patient demonstrates awareness that others withdraw when she is irritable.

## 2016-09-17 NOTE — Progress Notes (Signed)
Patient was observed in her room most of this evening.  When approached before evening wrap up meeting, she was hostile toward writer and complained about all the noise on the unit.  She contracts for safety and took her medications.  She later returned and in a soft voice, apologized for her "gruffness".

## 2016-09-17 NOTE — Plan of Care (Signed)
Problem: Education: Goal: Mental status will improve Outcome: Progressing Continues to labile, unpredictable, manipulative and splits staff. Requires redirection. Safety maintained.

## 2016-09-17 NOTE — [facility] Group Notes (Signed)
[facility] LCSW Group Therapy  09/17/2016 3:55 PM  Type of Therapy:  Group Therapy  Participation Level:  Patient did not attend group. CSW invited patient to group.   Summary of Progress/Problems: Stages of Grief: Group facilitator defined grief and the stages of the grieving process. Facilitator offered support to patients and allowed patients to share openly about their experiences with grief. Patients explored how their individual experiences with grief have impacted their wellness and recovery. Each patient was challenged to recognize their obstacles in finding acceptance after experiencing a loss. Facilitator processed with patients new coping skills to overcome their challenges with grief. Group members received and gave support to group members.   Janice Walls G. Garnette CzechSampson MSW, LCSWA 09/17/2016, 3:55 PM

## 2016-09-17 NOTE — [facility] Group Notes (Signed)
[facility] Group Notes:  (Nursing/MHT/Case Management/Adjunct)  Date:  09/17/2016  Time:  6:00 AM  Type of Therapy:  Psychoeducational Skills  Participation Level:  Did Not Attend  Summary of Progress/Problems:  Kameron Blethen Y Tracina Beaumont 09/17/2016, 6:00 AM 

## 2016-09-17 NOTE — Progress Notes (Signed)
Patient was less irritable this evening compared to the past two evenings.  She was pleasant to peers and easier to engage in conversation about her medications.

## 2016-09-18 DIAGNOSIS — B1920 Unspecified viral hepatitis C without hepatic coma: Secondary | ICD-10-CM

## 2016-09-18 DIAGNOSIS — R740 Nonspecific elevation of levels of transaminase and lactic acid dehydrogenase [LDH]: Secondary | ICD-10-CM

## 2016-09-18 DIAGNOSIS — R7401 Elevation of levels of liver transaminase levels: Secondary | ICD-10-CM

## 2016-09-18 LAB — COMPREHENSIVE METABOLIC PANEL
ALT: 601 U/L — ABNORMAL HIGH (ref 14–54)
ANION GAP: 7 (ref 5–15)
AST: 322 U/L — ABNORMAL HIGH (ref 15–41)
Albumin: 3.8 g/dL (ref 3.5–5.0)
Alkaline Phosphatase: 65 U/L (ref 38–126)
BILIRUBIN TOTAL: 0.8 mg/dL (ref 0.3–1.2)
BUN: 14 mg/dL (ref 6–20)
CO2: 26 mmol/L (ref 22–32)
Calcium: 9.2 mg/dL (ref 8.9–10.3)
Chloride: 106 mmol/L (ref 101–111)
Creatinine, Ser: 0.6 mg/dL (ref 0.44–1.00)
GFR calc Af Amer: >60 mL/min (ref >60)
Glucose, Bld: 87 mg/dL (ref 65–99)
POTASSIUM: 4.2 mmol/L (ref 3.5–5.1)
Sodium: 139 mmol/L (ref 135–145)
TOTAL PROTEIN: 7 g/dL (ref 6.5–8.1)

## 2016-09-18 LAB — LITHIUM LEVEL: Lithium Lvl: 0.5 mmol/L — ABNORMAL LOW (ref 0.60–1.20)

## 2016-09-18 MED ORDER — DIPHENHYDRAMINE HCL 25 MG PO CAPS: 25.0000 mg | ORAL_CAPSULE | Freq: Every day | ORAL | 0 refills | Status: DC

## 2016-09-18 MED ORDER — LITHIUM CARBONATE ER 450 MG PO TBCR: 450.0000 mg | EXTENDED_RELEASE_TABLET | Freq: Two times a day (BID) | ORAL | 0 refills | Status: DC

## 2016-09-18 MED ORDER — PALIPERIDONE ER 6 MG PO TB24: 6.0000 mg | ORAL_TABLET | ORAL | 0 refills | Status: DC

## 2016-09-18 MED ORDER — ZOLPIDEM TARTRATE 5 MG PO TABS: 5.0000 mg | ORAL_TABLET | Freq: Every evening | ORAL | 0 refills | Status: DC | PRN

## 2016-09-18 MED ORDER — ARIPIPRAZOLE 30 MG PO TABS: 30.0000 mg | ORAL_TABLET | Freq: Every day | ORAL | 0 refills | Status: DC

## 2016-09-18 NOTE — Discharge Summary (Signed)
Physician Discharge Summary Note  Patient:  Janice Walls is an 31 y.o., female MRN:  [id number] DOB:  1986-04-17 Patient phone:  [phone] (home)  Patient address:   [address] [place] [address],  Total Time spent with patient: 30 minutes  Date of Admission:  09/12/2016 Date of Discharge: 09/18/16  Reason for Admission:  Mania  Principal Problem: Bipolar affective disorder, current episode manic [facility]) Discharge Diagnoses: Patient Active Problem List   Diagnosis Date Noted  . Hepatitis C [B19.20] 09/18/2016  . Transaminitis [R74.0] 09/18/2016  . Bipolar affective disorder, current episode manic (HCC) [F31.9] 09/12/2016  . Tobacco use disorder [F17.200] 09/12/2016  . Cannabis use disorder, severe, dependence (HCC) [F12.20] 09/12/2016    History of Present Illness:  Patient is a 31 year old single Caucasian female from it in [place]. The patient has a history of bipolar disorder. She has been noncompliant with treatment.  Patient presented voluntarily to our emergency department on May 13 requesting a urine toxicology screen, patient brought her cat with her. Patient said that her mother thinks she is still using drugs when she is not.  She wanted a urine toxicology screen here so she can show her mom she was not abusing any substances.  Presentation the patient was clearly manic. Her thought process was disorganized. Her mother came to visit her and the patient became very agitated and verbally aggressive to her mother and the staff in the emergency department. The patient was discriminating yelling at staff patient tried to run up the hall and out of the area where she was. Patient started screaming that she was going to just kill herself.  During assessment today the patient was pleasant and cooperative but clearly euphoric, hyperverbal and has psychomotor agitation. The patient does not believe she is manic at this time. She denies having any problems with mood  appetite, energy, sleep or concentration. She claims that she has been is sleeping 6 hours every night. Per nursing is that she slept 3 hours last night.  Patient denies the use of any illicit substances. Her urine toxicology is positive for cannabis. Patient smokes half a pack of cigarettes per day. She denies the use of alcohol or any illicit substances.  Trauma history was not explored   Associated Signs/Symptoms: Depression Symptoms:  denies (Hypo) Manic Symptoms:  Distractibility, Elevated Mood, Impulsivity, Anxiety Symptoms:  Excessive Worry, Psychotic Symptoms:  denies PTSD Symptoms: NA Total Time spent with patient: 1 hour  Past Psychiatric History: Patient has been off medications for several years. She is not currently following up with anyone. She's been diagnosed with bipolar disorder since age 27. She has had multiple prior psychiatric hospitalizations. She has been in our unit in the past in 2016. She denies any history of suicidal attempts or self injury  Past Medical History:  Past Medical History:  Diagnosis Date  . Anxiety   . Bipolar 1 disorder (HCC)   . Depression   . Insomnia   . Substance abuse     Past Surgical History:  Procedure Laterality Date  . CESAREAN SECTION    . CESAREAN SECTION     Family History: History reviewed. No pertinent family history.   Family Psychiatric  History: Patient reports that her 25 year old daughter has bipolar disorder. Her father was an alcoholic and an die of an overdose of crystal meth  Social History: Patient is currently living by herself in [place]. She has 2 children ages 16 years old and a 59 year old. Her  32 year old was adopted by the patient's mother into treatment-year-old is currently living with the patient's mother as well. Patient has been married in the past she is currently divorced.  History  Alcohol Use No     History  Drug Use  . Types: Marijuana    Social History   Social  History  . Marital status: Single    Spouse name: N/A  . Number of children: N/A  . Years of education: N/A   Social History Main Topics  . Smoking status: Current Some Day Smoker    Packs/day: 0.50    Types: Cigarettes  . Smokeless tobacco: Never Used  . Alcohol use No  . Drug use: Yes    Types: Marijuana  . Sexual activity: Yes    Birth control/ protection: None   Other Topics Concern  . None   Social History Narrative  . None    Hospital Course:    Patient is a 31 year old Caucasian female with bipolar disorder type I. She is presenting manic in the setting of noncompliance with medications.  Bipolar disorder: Pt was started on Abilify 30 mgby mouth daily and lithium CR 450 mg by mouth twice a day. Received Abilify injectable 400 mg IM on 5/17.   Lithium level 0.5 on May 21  LFT's were significantly elevated compared to admission.  Only new medication metabolized by liver is abilify oral. I will d/c abilify oral and instead use invega 6 mg qhs for about 30 days --until she receives next abilify inj. After receiving abilify IM the invega could be discontinued  For insomnia: will order benadryl 50 mg qhs  Tobacco use disorder patient declines from receiving a nicotine patch  Substance abuse: Patient has been smoking marijuana but denies use of any other substances lately. She also has a history of benzo abuse. She has been educated about the negative effects of cannabis and other substances in mood. She was advised to abstain from all illicit drugs and alcohol as they may worsen mood symptoms and liver disease. The patient was encouraged and her meaningful recovery program at the time of discharge but currently is resistant.  Elevated LFTs: She has Hepatitis C.  The patient has been made aware of the need to get treatment for hepatitis C. Follow-up appointment at [facility] will be scheduled after discharge.   Disposition:  she will be discharged back to her home.  She will need psychotropic medication management follow-up appointment. The patient also needs to follow up at [facility] clinic.  Labs: TSH normal, hemoglobin A1c 4.8, lipid panel within the normal limits.  Urine pregnancy negative.  Renal function within the normal limits  This hospitalization was uneventful. The patient was initially admitted due to mania. She was started on Abilify oral which was titrated up to 30 mg a day. She also received Abilify injectable. For mood stabilization she was started on lithium CR 450 mg twice a day. She tolerated medications well. By the time she was discharged manic symptoms were resolved.  Patient reports feeling much better. She isn't sleeping well, she denies plan with appetite, energy, or concentration. There is no evidence of mania or hypomania or psychosis. Denies SI, HI or hallucinations  Family has been contacted they do not have any concerns about discharge. They have confirmed there is no guns in the home.  Staff working with the patient feels patient is much improved and they do not have any concerns about her safety or safety of others upon discharge  Patient did not require seclusion, restraints or forced medications. She was not disruptive. Patient did not display any unsafe behaviors  On the day of discharge the patient LFTs were checked. The AST and ALT were significantly elevated, much more than what they were at admission. Patient has history of hep C. The only current medication that she is taking that is metabolized by the liver is Abilify oral. Therefore the Abilify oral will be discontinued. To prevent relapse and to reach between now and the next Abilify injection she will be prescribed with Invega 6 mg. This medication was chosen as it is not metabolized by the liver.  Prescriptions were sent to [facility]  Physical Findings: AIMS: Facial and Oral Movements Muscles of Facial Expression: None, normal Lips and Perioral Area: None,  normal Jaw: None, normal Tongue: None, normal,Extremity Movements Upper (arms, wrists, hands, fingers): None, normal Lower (legs, knees, ankles, toes): None, normal, Trunk Movements Neck, shoulders, hips: None, normal, Overall Severity Severity of abnormal movements (highest score from questions above): None, normal Incapacitation due to abnormal movements: None, normal Patient's awareness of abnormal movements (rate only patient's report): No Awareness, Dental Status Current problems with teeth and/or dentures?: Yes (Poor dental hygiene) Does patient usually wear dentures?: No  CIWA:    COWS:     Musculoskeletal: Strength & Muscle Tone: within normal limits Gait & Station: normal Patient leans: N/A  Psychiatric Specialty Exam: Physical Exam  Constitutional: She is oriented to person, place, and time. She appears well-developed and well-nourished.  HENT:  Head: Normocephalic and atraumatic.  Eyes: Conjunctivae and EOM are normal.  Neck: Normal range of motion.  Respiratory: Effort normal.  Musculoskeletal: Normal range of motion.  Neurological: She is alert and oriented to person, place, and time.    Review of Systems  Constitutional: Negative.   HENT: Negative.   Eyes: Negative.   Respiratory: Negative.   Cardiovascular: Negative.   Gastrointestinal: Negative.   Genitourinary: Negative.   Musculoskeletal: Negative.   Skin: Negative.   Neurological: Negative.   Endo/Heme/Allergies: Negative.   Psychiatric/Behavioral: Negative.     Blood pressure 131/73, pulse 80, temperature 98.5 F (36.9 C), temperature source Oral, resp. rate 18, height 5\' 1"  (1.549 m), weight 57.6 kg (127 lb), last menstrual period 08/27/2016, SpO2 99 %.Body mass index is 24 kg/m.  General Appearance: Well Groomed  Eye Contact:  Good  Speech:  Clear and Coherent  Volume:  Normal  Mood:  Euthymic  Affect:  Appropriate and Congruent  Thought Process:  Linear and Descriptions of Associations:  Intact  Orientation:  Full (Time, Place, and Person)  Thought Content:  Hallucinations: None  Suicidal Thoughts:  No  Homicidal Thoughts:  No  Memory:  Immediate;   Good Recent;   Good Remote;   Good  Judgement:  Fair  Insight:  Fair  Psychomotor Activity:  Normal  Concentration:  Concentration: Good and Attention Span: Good  Recall:  Good  Fund of Knowledge:  Good  Language:  Good  Akathisia:  No  Handed:    AIMS (if indicated):     Assets:  Manufacturing systems engineer Social Support  ADL's:  Intact  Cognition:  WNL  Sleep:  Number of Hours: 8     Have you used any form of tobacco in the last 30 days? (Cigarettes, Smokeless Tobacco, Cigars, and/or Pipes): Yes  Has this patient used any form of tobacco in the last 30 days? (Cigarettes, Smokeless Tobacco, Cigars, and/or Pipes) Yes, Yes, A prescription for an FDA-approved tobacco  cessation medication was offered at discharge and the patient refused  Blood Alcohol level:  Lab Results  Component Value Date   [facility]    Metabolic Disorder Labs:  Lab Results  Component Value Date   HGBA1C 4.8 09/12/2016   MPG 91 09/12/2016   Lab Results  Component Value Date   PROLACTIN 37.4 (H) 09/12/2016   Lab Results  Component Value Date   CHOL 130 09/12/2016   TRIG 79 09/12/2016   HDL 41 09/12/2016   CHOLHDL 3.2 09/12/2016   VLDL 16 09/12/2016   LDLCALC 73 09/12/2016   Results for Glenna DurandSIMMONS, Hilaria (MRN [id number]) as of 09/18/2016 12:13  Ref. Range 09/10/2016 13:13 09/11/2016 19:37 09/12/2016 06:52 09/12/2016 07:55 09/18/2016 06:53  Total CHOL/HDL Ratio Latest Units: RATIO   3.2    Cholesterol Latest Ref Range: 0 - 200 mg/dL   865130    HDL Cholesterol Latest Ref Range: >40 mg/dL   41    LDL (calc) Latest Ref Range: 0 - 99 mg/dL   73    Triglycerides Latest Ref Range: <150 mg/dL   79    VLDL Latest Ref Range: 0 - 40 mg/dL   16    WBC Latest Ref Range: 3.6 - 11.0 K/uL 7.4      RBC Latest Ref Range: 3.80 - 5.20 MIL/uL 4.37       Hemoglobin Latest Ref Range: 12.0 - 16.0 g/dL 78.413.5      HCT Latest Ref Range: 35.0 - 47.0 % 39.6      MCV Latest Ref Range: 80.0 - 100.0 fL 90.7      MCH Latest Ref Range: 26.0 - 34.0 pg 30.8      MCHC Latest Ref Range: 32.0 - 36.0 g/dL 69.634.0      RDW Latest Ref Range: 11.5 - 14.5 % 15.8 (H)      Platelets Latest Ref Range: 150 - 440 K/uL 184      Neutrophils Latest Units: % 57      Lymphocytes Latest Units: % 30      Monocytes Relative Latest Units: % 10      Eosinophil Latest Units: % 2      Basophil Latest Units: % 1      NEUT# Latest Ref Range: 1.4 - 6.5 K/uL 4.3      Lymphocyte # Latest Ref Range: 1.0 - 3.6 K/uL 2.2      Monocyte # Latest Ref Range: 0.2 - 0.9 K/uL 0.7      Eosinophils Absolute Latest Ref Range: 0 - 0.7 K/uL 0.2      Basophils Absolute Latest Ref Range: 0 - 0.1 K/uL 0.0      Lithium Latest Ref Range: 0.60 - 1.20 mmol/L <0.06 (L)    0.50 (L)  Prolactin Latest Ref Range: 4.8 - 23.3 ng/mL   37.4 (H)    Glucose Latest Ref Range: 65 - 99 mg/dL 98    87  Hemoglobin E9BA1C Latest Ref Range: 4.8 - 5.6 %   4.8    TSH Latest Ref Range: 0.350 - 4.500 uIU/mL   4.463     See Psychiatric Specialty Exam and Suicide Risk Assessment completed by Attending Physician prior to discharge.  Discharge destination:  Home  Is patient on multiple antipsychotic therapies at discharge:  Yes,   Do you recommend tapering to monotherapy for antipsychotics?  Yes   Has Patient had three or more failed trials of antipsychotic monotherapy by history:  No  Recommended Plan for Multiple  Antipsychotic Therapies: Taper to monotherapy as described:  gradually decrease invega   Allergies as of 09/18/2016      Reactions   Aspirin Other (See Comments)   Causes headaches Causes headaches   Morphine Other (See Comments)   Increases temperature      Medication List    STOP taking these medications   divalproex 500 MG DR tablet Commonly known as:  DEPAKOTE   haloperidol 2 MG tablet Commonly  known as:  HALDOL   hydrOXYzine 25 MG tablet Commonly known as:  ATARAX/VISTARIL   omeprazole 20 MG capsule Commonly known as:  PRILOSEC   oxyCODONE-acetaminophen 5-325 MG tablet Commonly known as:  ROXICET   Potassium Chloride ER 20 MEQ Tbcr   promethazine 12.5 MG suppository Commonly known as:  PHENERGAN   promethazine 12.5 MG tablet Commonly known as:  PHENERGAN   sulfamethoxazole-trimethoprim 800-160 MG tablet Commonly known as:  BACTRIM DS,SEPTRA DS     TAKE these medications     Indication  ARIPiprazole ER 400 MG Srer Inject 400 mg into the muscle every 30 (thirty) days. Due on June 14 Start taking on:  10/12/2016  Indication:  bipolar   diphenhydrAMINE 25 mg capsule Commonly known as:  BENADRYL Take 1 capsule (25 mg total) by mouth at bedtime.  Indication:  Trouble Sleeping   lithium carbonate 450 MG CR tablet Commonly known as:  ESKALITH Take 1 tablet (450 mg total) by mouth 2 (two) times daily.  Indication:  Bipolar   paliperidone 6 MG 24 hr tablet Commonly known as:  INVEGA Take 1 tablet (6 mg total) by mouth every morning.  Indication:  bipolar      Follow-up Information    Center, [facility]. Go on 09/21/2016.   Specialty:  General Practice Why:  Your appointment has been rescheduled. Your appointment is at 11:20 am  . Please bring your discharge paperwork and your insurance cards to this appointment. Contact information: [address] [place] [address] [phone]        [facility] Go on 09/20/2016.   Why:  Please meet Unk Pinto at [facility] on Wednesday, May 23, at 7am for your follow up appointment.  Please bring a copy of your hospital discharge paperwork. Contact information: [address] [place] [address] [phone]          >30 minutes. >50 % of the time was spent in coordination of care  Signed: Jimmy Footman, MD 09/18/2016, 12:33 PM

## 2016-09-18 NOTE — Progress Notes (Signed)
Recreation Therapy Notes  INPATIENT RECREATION TR PLAN  Patient Details Name: Janice Walls MRN: [id number] DOB: [birth year] Today's Date: 09/18/2016  Rec Therapy Plan Is patient appropriate for Therapeutic Recreation?: Yes Treatment times per week: At least once a week TR Treatment/Interventions: 1:1 session, Group participation (Comment) (Appropriate participation in daily recreational therapy tx)  Discharge Criteria Pt will be discharged from therapy if:: Treatment goals are met, Discharged Treatment plan/goals/alternatives discussed and agreed upon by:: Patient/family  Discharge Summary Short term goals set: See Care Plan Short term goals met: Complete Progress toward goals comments: One-to-one attended Which groups?: Self-esteem, Coping skills, Leisure education, Other (Comment) (Self-expression) One-to-one attended: Anger management Reason goals not met: N/A Therapeutic equipment acquired: None Reason patient discharged from therapy: Discharge from hospital Pt/family agrees with progress & goals achieved: Yes Date patient discharged from therapy: 09/18/16   Leonette Monarch, LRT/CTRS 09/18/2016, 11:50 AM

## 2016-09-18 NOTE — Progress Notes (Signed)
Provided and reviewed discharge paperwork and prescriptions. Verified understanding by use of teach back method. Verbalizes understanding as well. Denies SI/HI/AVH, pain. Belongings returned once discharged as noted. Pt to be discharged to ED parking lot where her vehicle is parked. Pt will drive herself home. Safety maintained.

## 2016-09-18 NOTE — Progress Notes (Signed)
Recreation Therapy Notes  Date: 05.21.18 Time: 9:30 am Location: Craft Room  Group Topic: Self-expression  Goal Area(s) Addresses:  Patient will be able to identify a color that represents each emotion. Patient will verbalize benefit of using art as a means of self-expression. Patient will verbalize one emotion experienced while participating in activity.  Behavioral Response: Intermittently Attentive, Interactive  Intervention: The Colors Within Me  Activity: Patients were given a blank face worksheet and were instructed to pick a color for each emotion they were feeling and show on the worksheet how much of that emotion they were feeling.  Education: LRT educated patient on other forms of self-expression.  Education Outcome: In group clarification offered  Clinical Observations/Feedback: Patient drew a Programmer, multimediaself-portrait. Patient contributed to group discussion by stating what emotions she was feeling. Patient was drawing for the majority of group.  [name],Zabian Swayne M, LRT/CTRS 09/18/2016 10:27 AM

## 2016-09-18 NOTE — [facility] Group Notes (Signed)
[facility] Group Notes:  (Nursing/MHT/Case Management/Adjunct)  Date:  09/18/2016  Time:  4:13 AM  Type of Therapy:  Group Therapy  Participation Level:  Active  Participation Quality:  Appropriate  Affect:  Appropriate  Cognitive:  Appropriate  Insight:  Appropriate  Engagement in Group:  Engaged  Modes of Intervention:  n/a  Summary of Progress/Problems:  Janice Walls Janice Walls 09/18/2016, 4:13 AM

## 2016-09-18 NOTE — Plan of Care (Signed)
Problem: [facility] Participation in Recreation Therapeutic Interventions Goal: STG-Patient will identify at least five coping skills for ** STG: Coping Skills - Within 4 treatment sessions, patient will verbalize at least 5 coping skills for anger in each of 2 treatment sessions to increase anger management skills.  Outcome: Completed/Met Date Met: 09/18/16 Treatment Session 2; Completed 2 out of 2: At approximately 9:00 am, LRT met with patient in consultation room. Patient verbalized 5 coping skills for anger. LRT encouraged patient to use her coping skills when she felt herself get angry to help calm herself down.  Leonette Monarch, LRT/CTRS 05.21.18 11:48 am

## 2016-09-18 NOTE — Progress Notes (Signed)
  [facility] :  Will you be returning to the same living situation after discharge:  Yes,  own home At discharge, do you have transportation home?: Yes,  own car Do you have the ability to pay for your medications: Yes,  medicaid  Release of information consent forms completed and in the chart;  Patient's signature needed at discharge.  Patient to Follow up at: Follow-up Information    [facility] Go on 09/21/2016.   Specialty:  General Practice Why:  Your appointment has been rescheduled. Your appointment is at 11:20 am  . Please bring your discharge paperwork and your insurance cards to this appointment. Contact information: [address] [place] [address] [phone]        [facility] Go on 09/20/2016.   Why:  [facility] May [facility], at 7am for your follow up appointment.  Please bring a copy of your hospital discharge paperwork. Contact information: [address] [place] [address] [phone]           Next level of care provider has access to [facility] Link:no  Safety Planning and Suicide Prevention discussed: Yes,  with mother  Have you used any form of tobacco in the last 30 days? (Cigarettes, Smokeless Tobacco, Cigars, and/or Pipes): Yes  Has patient been referred to the Quitline?: Patient refused referral  Patient has been referred for addiction treatment: Yes  Lorri FrederickWierda, Lakaisha Danish Jon, LCSW 09/18/2016, 10:41 AM

## 2016-09-18 NOTE — [facility] Suicide Risk Assessment (Signed)
[facility]   Principal Problem: Bipolar affective disorder, current episode manic [facility](HCC) Discharge Diagnoses:  Patient Active Problem List   Diagnosis Date Noted  . Bipolar affective disorder, current episode manic (HCC) [F31.9] 09/12/2016  . Tobacco use disorder [F17.200] 09/12/2016  . Cannabis use disorder, severe, dependence (HCC) [F12.20] 09/12/2016     Psychiatric Specialty Exam: ROS  Blood pressure 131/73, pulse 80, temperature 98.5 F (36.9 C), temperature source Oral, resp. rate 18, height 5\' 1"  (1.549 m), weight 57.6 kg (127 lb), last menstrual period 08/27/2016, SpO2 99 %.Body mass index is 24 kg/m.                                                       Mental Status Per Nursing Assessment::   On Admission:     Demographic Factors:  Caucasian and Living alone  Loss Factors: Loss of significant relationship and Financial problems/change in socioeconomic status  Historical Factors: Impulsivity  Risk Reduction Factors:   Responsible for children under 32 years of age, Sense of responsibility to family and Positive social support  No acces to guns  Continued Clinical Symptoms:  Previous Psychiatric Diagnoses and Treatments  Cognitive Features That Contribute To Risk:  None    Suicide Risk:  Minimal: No identifiable suicidal ideation.  Patients presenting with no risk factors but with morbid ruminations; may be classified as minimal risk based on the severity of the depressive symptoms  Follow-up Information    [facility]. Go on 09/21/2016.   Specialty:  General Practice Why:  Your appointment has been rescheduled. Your appointment is at 11:20 am  . Please bring your discharge paperwork and your insurance cards to this appointment. Contact information: [address] [place] [address] [phone]        [facility] Go on 09/20/2016.   Why:  Please  [facility] at 7am for your follow up appointment.  Please bring a copy of your hospital discharge paperwork. Contact information: [address] [place] [address] [phone]             Jimmy FootmanHernandez-Gonzalez,  Marylynne Keelin, MD 09/18/2016, 9:19 AM

## 2016-10-09 ENCOUNTER — Encounter: Payer: Self-pay | Admitting: Emergency Medicine

## 2016-10-09 ENCOUNTER — Emergency Department: Payer: Medicaid Other

## 2016-10-09 ENCOUNTER — Emergency Department
Admission: EM | Admit: 2016-10-09 | Discharge: 2016-10-09 | Disposition: A | Discharge status: Home / Self Care | Payer: Medicaid Other | Attending: Student in an Organized Health Care Education/Training Program | Admitting: Student in an Organized Health Care Education/Training Program

## 2016-10-09 DIAGNOSIS — F319 Bipolar disorder, unspecified: Secondary | ICD-10-CM | POA: Diagnosis not present

## 2016-10-09 DIAGNOSIS — F1220 Cannabis dependence, uncomplicated: Secondary | ICD-10-CM | POA: Diagnosis not present

## 2016-10-09 DIAGNOSIS — F17210 Nicotine dependence, cigarettes, uncomplicated: Secondary | ICD-10-CM | POA: Diagnosis not present

## 2016-10-09 DIAGNOSIS — N200 Calculus of kidney: Secondary | ICD-10-CM | POA: Diagnosis not present

## 2016-10-09 DIAGNOSIS — R300 Dysuria: Secondary | ICD-10-CM | POA: Diagnosis not present

## 2016-10-09 DIAGNOSIS — Z79899 Other long term (current) drug therapy: Secondary | ICD-10-CM | POA: Insufficient documentation

## 2016-10-09 LAB — URINALYSIS, ROUTINE W REFLEX MICROSCOPIC
BACTERIA UA: NONE SEEN
Bilirubin Urine: NEGATIVE
Glucose, UA: NEGATIVE mg/dL
Ketones, ur: NEGATIVE mg/dL
NITRITE: NEGATIVE
Protein, ur: NEGATIVE mg/dL
SPECIFIC GRAVITY, URINE: 1.009 (ref 1.005–1.030)
pH: 6 (ref 5.0–8.0)

## 2016-10-09 LAB — POCT PREGNANCY, URINE: PREG TEST UR: NEGATIVE

## 2016-10-09 MED ORDER — IBUPROFEN 600 MG PO TABS
600.0000 mg | ORAL_TABLET | Freq: Once | ORAL | Status: AC
  Administered 2016-10-09: 600 mg via ORAL
  Filled 2016-10-09: qty 1

## 2016-10-09 MED ORDER — SULFAMETHOXAZOLE-TRIMETHOPRIM 800-160 MG PO TABS: 1.0000 | ORAL_TABLET | Freq: Two times a day (BID) | ORAL | 0 refills | Status: AC

## 2016-10-09 MED ORDER — IBUPROFEN 600 MG PO TABS: 600.0000 mg | ORAL_TABLET | Freq: Four times a day (QID) | ORAL | 0 refills | Status: DC | PRN

## 2016-10-09 MED ORDER — TAMSULOSIN HCL 0.4 MG PO CAPS: 0.4000 mg | ORAL_CAPSULE | Freq: Every day | ORAL | 0 refills | Status: DC

## 2016-10-09 NOTE — ED Provider Notes (Signed)
[facility] Emergency Department Provider Note   ____________________________________________   I have reviewed the triage vital signs and the nursing notes.   HISTORY  Chief Complaint Dysuria    HPI Janice Walls is a 31 y.o. female presents withdysuria that developed over the last 2-3 days. She describes pain in the lower pelvic region is also at rest. Patient reports possibly blood in her urine [place] she is unable to confirm. Patient reports history of urinary tract infections in the past but has not had one recently. Patient is not sexually active at this time her last reported intercourse was 3 months ago. She denies any sexually transmitted diseases but she is hep C positive.   Patient denies fever, chills, headache, vision changes, chest pain, chest tightness, shortness of breath, abdominal pain, nausea and vomiting.  Past Medical History:  Diagnosis Date  . Anxiety   . Bipolar 1 disorder (HCC)   . Depression   . Insomnia   . Substance abuse     Patient Active Problem List   Diagnosis Date Noted  . Hepatitis C 09/18/2016  . Transaminitis 09/18/2016  . Bipolar affective disorder, current episode manic (HCC) 09/12/2016  . Tobacco use disorder 09/12/2016  . Cannabis use disorder, severe, dependence (HCC) 09/12/2016    Past Surgical History:  Procedure Laterality Date  . CESAREAN SECTION    . CESAREAN SECTION      Prior to Admission medications   Medication Sig Start Date End Date Taking? Authorizing Provider  ARIPiprazole ER 400 MG SRER Inject 400 mg into the muscle every 30 (thirty) days. Due on June 14 10/12/16   Jimmy Footman, MD  diphenhydrAMINE (BENADRYL) 25 mg capsule Take 1 capsule (25 mg total) by mouth at bedtime. 09/18/16   Jimmy Footman, MD  ibuprofen (ADVIL,MOTRIN) 600 MG tablet Take 1 tablet (600 mg total) by mouth every 6 (six) hours as needed. 10/09/16   Aggie Douse M, PA-C  lithium carbonate  (ESKALITH) 450 MG CR tablet Take 1 tablet (450 mg total) by mouth 2 (two) times daily. 09/18/16   Jimmy Footman, MD  paliperidone (INVEGA) 6 MG 24 hr tablet Take 1 tablet (6 mg total) by mouth every morning. 09/18/16   Jimmy Footman, MD  sulfamethoxazole-trimethoprim (BACTRIM DS,SEPTRA DS) 800-160 MG tablet Take 1 tablet by mouth 2 (two) times daily. 10/09/16 10/19/16  Alexie Samson M, PA-C  tamsulosin (FLOMAX) 0.4 MG CAPS capsule Take 1 capsule (0.4 mg total) by mouth daily after breakfast. 10/09/16   Kimball Appleby M, PA-C    Allergies Aspirin and Morphine  No family history on file.  Social History Social History  Substance Use Topics  . Smoking status: Current Some Day Smoker    Packs/day: 0.50    Types: Cigarettes  . Smokeless tobacco: Never Used  . Alcohol use No    Review of Systems Constitutional: Negative for fever/chills Eyes: No visual changes. ENT:  Negative for sore throat and for difficulty swallowing Cardiovascular: Denies chest pain. Respiratory: Denies cough Denies shortness of breath. Gastrointestinal: No abdominal pain.  No nausea, vomiting, diarrhea. Genitourinary: Positive for dysuria. Hematuria. Left side flank pain Musculoskeletal: Negative for generalized body aches. Skin: Negative for rash. Neurological: Negative for headaches.  Negative focal weakness or numbness. ____________________________________________   PHYSICAL EXAM:  VITAL SIGNS: ED Triage Vitals [10/09/16 1059]  Enc Vitals Group     BP 112/73     Pulse Rate 98     Resp 18     Temp 98.3  F (36.8 C)     Temp Source Oral     SpO2 100 %     Weight 130 lb (59 kg)     Height 5\' 2"  (1.575 m)     Head Circumference      Peak Flow      Pain Score 10     Pain Loc      Pain Edu?      Excl. in GC?     Constitutional: Alert and oriented. Well appearing and in no acute distress.  Head: Normocephalic and atraumatic. Eyes: Conjunctivae are normal. PERRL.    Cardiovascular: Normal rate, regular rhythm. Normal distal pulses. Respiratory: Normal respiratory effort.  Gastrointestinal: Soft and nontender. No distention. Genitourinary: Dysuria, pelvic pain, left side CVA tenderness. Hematuria. Negative vaginal discharge.  Musculoskeletal: Nontender with normal range of motion in all extremities. Neurologic: Normal speech and language. No gross focal neurologic deficits are appreciated. No gait instability. Cranial nerves: II-X intact. No sensory loss or abnormal reflexes.  Skin:  Skin is warm, dry and intact. No rash noted. Psychiatric: Mood and affect are normal.  ____________________________________________   LABS (all labs ordered are listed, but only abnormal results are displayed)  Labs Reviewed  URINALYSIS, ROUTINE W REFLEX MICROSCOPIC - Abnormal; Notable for the following:       Result Value   Color, Urine YELLOW (*)    APPearance HAZY (*)    Hgb urine dipstick LARGE (*)    Leukocytes, UA TRACE (*)    Squamous Epithelial / LPF 6-30 (*)    All other components within normal limits  POC URINE PREG, ED  POCT PREGNANCY, URINE   ____________________________________________  EKG none ____________________________________________  RADIOLOGY CT renal study IMPRESSION: Tiny nonobstructing right renal calculi. No hydronephrosis or obstructing stone.  Evaluation of bowel is limited by under distension and lack of fat planes. No obvious bowel inflammatory process is noted. Specifically, no inflammation surrounds the appendix or terminal ileum.  Trace calcification iliac arteries.  Top-normal size peri aortic lymph nodes.  Degenerative changes lower thoracic and lumbar spine. ____________________________________________   PROCEDURES  Procedure(s) performed: no    Critical Care performed: no ____________________________________________   INITIAL IMPRESSION / ASSESSMENT AND PLAN / ED COURSE  Pertinent labs & imaging  results that were available during my care of the patient were reviewed by me and considered in my medical decision making (see chart for details).  Patient presented with dysuria, lower pelvic pain, hematuria and left flank pain that began several days ago. Physical exam findings, imaging and labs were reassuring for no cystitis however, tiny nonobstructing right renal calculi. Patient will be provided prescription for tamsulosin 0.4, Bactrim for antibiotic coverage and recommend NSAIDS for pain management as needed. Vital signs were reassuring. Recommended hydration along with regimen above to assist passing of the stone. Patient informed of clinical course, understand medical decision-making process, and agree with plan.  Patient was advised to follow up with PCP as needed and was also advised to return to the emergency department for symptoms that change or worsen.      ____________________________________________   FINAL CLINICAL IMPRESSION(S) / ED DIAGNOSES  Final diagnoses:  Dysuria  Nephrolithiasis       NEW MEDICATIONS STARTED DURING THIS VISIT:  Discharge Medication List as of 10/09/2016  2:39 PM    START taking these medications   Details  ibuprofen (ADVIL,MOTRIN) 600 MG tablet Take 1 tablet (600 mg total) by mouth every 6 (six) hours as needed., Starting Mon  10/09/2016, Print    sulfamethoxazole-trimethoprim (BACTRIM DS,SEPTRA DS) 800-160 MG tablet Take 1 tablet by mouth 2 (two) times daily., Starting Mon 10/09/2016, Until Thu 10/19/2016, Print    tamsulosin (FLOMAX) 0.4 MG CAPS capsule Take 1 capsule (0.4 mg total) by mouth daily after breakfast., Starting Mon 10/09/2016, Print         Note:  This document was prepared using Dragon voice recognition software and may include unintentional dictation errors.    Clois ComberLittle, Ewart Carrera M, PA-C 10/09/16 1556    Willy Eddyobinson, Patrick, MD 10/09/16 1558

## 2016-10-09 NOTE — ED Triage Notes (Signed)
Urinary frequency and pain x 3 days.  

## 2016-10-09 NOTE — ED Notes (Signed)
See triage note  States she developed some dysuria over the past few days   Also noticed some blood when she wiped

## 2016-11-07 ENCOUNTER — Emergency Department
Admission: EM | Admit: 2016-11-07 | Discharge: 2016-11-07 | Disposition: A | Discharge status: Home / Self Care | Payer: Medicaid Other | Attending: Emergency Medicine | Admitting: Emergency Medicine

## 2016-11-07 ENCOUNTER — Encounter: Payer: Self-pay | Admitting: Emergency Medicine

## 2016-11-07 DIAGNOSIS — R319 Hematuria, unspecified: Secondary | ICD-10-CM | POA: Insufficient documentation

## 2016-11-07 DIAGNOSIS — F17210 Nicotine dependence, cigarettes, uncomplicated: Secondary | ICD-10-CM | POA: Diagnosis not present

## 2016-11-07 DIAGNOSIS — R740 Nonspecific elevation of levels of transaminase and lactic acid dehydrogenase [LDH]: Secondary | ICD-10-CM | POA: Insufficient documentation

## 2016-11-07 DIAGNOSIS — R7401 Elevation of levels of liver transaminase levels: Secondary | ICD-10-CM

## 2016-11-07 DIAGNOSIS — R1030 Lower abdominal pain, unspecified: Secondary | ICD-10-CM | POA: Diagnosis present

## 2016-11-07 DIAGNOSIS — Z79899 Other long term (current) drug therapy: Secondary | ICD-10-CM | POA: Insufficient documentation

## 2016-11-07 DIAGNOSIS — R1084 Generalized abdominal pain: Secondary | ICD-10-CM

## 2016-11-07 LAB — COMPREHENSIVE METABOLIC PANEL
ALK PHOS: 131 U/L — AB (ref 38–126)
ALT: 870 U/L — AB (ref 14–54)
AST: 342 U/L — ABNORMAL HIGH (ref 15–41)
Albumin: 3.9 g/dL (ref 3.5–5.0)
Anion gap: 6 (ref 5–15)
BUN: 12 mg/dL (ref 6–20)
CALCIUM: 9 mg/dL (ref 8.9–10.3)
CO2: 26 mmol/L (ref 22–32)
CREATININE: 0.42 mg/dL — AB (ref 0.44–1.00)
Chloride: 105 mmol/L (ref 101–111)
Glucose, Bld: 90 mg/dL (ref 65–99)
Potassium: 4.4 mmol/L (ref 3.5–5.1)
Sodium: 137 mmol/L (ref 135–145)
Total Bilirubin: 0.6 mg/dL (ref 0.3–1.2)
Total Protein: 7.3 g/dL (ref 6.5–8.1)

## 2016-11-07 LAB — URINALYSIS, COMPLETE (UACMP) WITH MICROSCOPIC
Bacteria, UA: NONE SEEN
Bilirubin Urine: NEGATIVE
GLUCOSE, UA: NEGATIVE mg/dL
KETONES UR: NEGATIVE mg/dL
LEUKOCYTES UA: NEGATIVE
Nitrite: NEGATIVE
Protein, ur: NEGATIVE mg/dL
SPECIFIC GRAVITY, URINE: 1.014 (ref 1.005–1.030)
pH: 7 (ref 5.0–8.0)

## 2016-11-07 LAB — CBC
HCT: 41.7 % (ref 35.0–47.0)
Hemoglobin: 14.1 g/dL (ref 12.0–16.0)
MCH: 31.4 pg (ref 26.0–34.0)
MCHC: 33.9 g/dL (ref 32.0–36.0)
MCV: 92.7 fL (ref 80.0–100.0)
PLATELETS: 226 10*3/uL (ref 150–440)
RBC: 4.5 MIL/uL (ref 3.80–5.20)
RDW: 14.2 % (ref 11.5–14.5)
WBC: 9.3 10*3/uL (ref 3.6–11.0)

## 2016-11-07 LAB — LIPASE, BLOOD: Lipase: 32 U/L (ref 11–51)

## 2016-11-07 LAB — POC URINE PREG, ED: Preg Test, Ur: NEGATIVE

## 2016-11-07 MED ORDER — ONDANSETRON 4 MG PO TBDP
4.0000 mg | ORAL_TABLET | Freq: Once | ORAL | Status: AC | PRN
  Administered 2016-11-07: 4 mg via ORAL
  Filled 2016-11-07: qty 1

## 2016-11-07 MED ORDER — IBUPROFEN 600 MG PO TABS: 600.0000 mg | ORAL_TABLET | Freq: Three times a day (TID) | ORAL | 0 refills | Status: DC | PRN

## 2016-11-07 MED ORDER — IBUPROFEN 400 MG PO TABS
600.0000 mg | ORAL_TABLET | Freq: Once | ORAL | Status: AC
  Administered 2016-11-07: 600 mg via ORAL
  Filled 2016-11-07: qty 2

## 2016-11-07 NOTE — ED Triage Notes (Signed)
Patient reports abdominal cramping and vaginal bleeding x3 days. States she was recently dx with kidney stones and was unsure if it was related. Patient reports that she had sex 3 days ago and that is when pain and bleeding began. Reports using 1 pad/hr x 3 days. States she has had some nausea. Denies vomiting.

## 2016-11-07 NOTE — ED Provider Notes (Signed)
[facility] Emergency Department Provider Note  ____________________________________________   First MD Initiated Contact with Patient 11/07/16 1743     (approximate)  I have reviewed the triage vital signs and the nursing notes.   HISTORY  Chief Complaint Vaginal Bleeding    HPI Janice Walls is a 31 y.o. female who self presents the emergency department with several days of diffuse abdominal discomfort. No fevers or chills. Some nausea but no vomiting. One month ago she was diagnosed with a right-sided kidney stone and she/if this is the same. Her last menstrual period was 3 weeks ago. I appreciate the triage note is vaginal bleeding however she denies vaginal bleeding stating that she has a small amount of hematuria. Nothing seems to make her pain better or worse.   Past Medical History:  Diagnosis Date  . Anxiety   . Bipolar 1 disorder (HCC)   . Depression   . Insomnia   . Substance abuse     Patient Active Problem List   Diagnosis Date Noted  . Hepatitis C 09/18/2016  . Transaminitis 09/18/2016  . Bipolar affective disorder, current episode manic (HCC) 09/12/2016  . Tobacco use disorder 09/12/2016  . Cannabis use disorder, severe, dependence (HCC) 09/12/2016    Past Surgical History:  Procedure Laterality Date  . CESAREAN SECTION    . CESAREAN SECTION      Prior to Admission medications   Medication Sig Start Date End Date Taking? Authorizing Provider  ARIPiprazole ER 400 MG SRER Inject 400 mg into the muscle every 30 (thirty) days. Due on June 14 10/12/16   Jimmy Footman, MD  diphenhydrAMINE (BENADRYL) 25 mg capsule Take 1 capsule (25 mg total) by mouth at bedtime. 09/18/16   Jimmy Footman, MD  ibuprofen (ADVIL,MOTRIN) 600 MG tablet Take 1 tablet (600 mg total) by mouth every 6 (six) hours as needed. 10/09/16   Little, Traci M, PA-C  lithium carbonate (ESKALITH) 450 MG CR tablet Take 1 tablet (450 mg total)  by mouth 2 (two) times daily. 09/18/16   Jimmy Footman, MD  paliperidone (INVEGA) 6 MG 24 hr tablet Take 1 tablet (6 mg total) by mouth every morning. 09/18/16   Jimmy Footman, MD  tamsulosin (FLOMAX) 0.4 MG CAPS capsule Take 1 capsule (0.4 mg total) by mouth daily after breakfast. 10/09/16   Little, Traci M, PA-C    Allergies Patient has no active allergies.  History reviewed. No pertinent family history.  Social History Social History  Substance Use Topics  . Smoking status: Current Some Day Smoker    Packs/day: 0.50    Types: Cigarettes  . Smokeless tobacco: Never Used  . Alcohol use No    Review of Systems Constitutional: No fever/chills Eyes: No visual changes. ENT: No sore throat. Cardiovascular: Denies chest pain. Respiratory: Denies shortness of breath. Gastrointestinal: Positive abdominal pain.  No nausea, no vomiting.  No diarrhea.  No constipation. Genitourinary: Negative for dysuria. Musculoskeletal: Negative for back pain. Skin: Negative for rash. Neurological: Negative for headaches, focal weakness or numbness.   ____________________________________________   PHYSICAL EXAM:  VITAL SIGNS: ED Triage Vitals  Enc Vitals Group     BP 11/07/16 1638 111/74     Pulse Rate 11/07/16 1638 82     Resp 11/07/16 1638 16     Temp 11/07/16 1638 98.7 F (37.1 C)     Temp Source 11/07/16 1638 Oral     SpO2 11/07/16 1638 100 %     Weight 11/07/16 1639 140 lb (  63.5 kg)     Height 11/07/16 1639 5\' 2"  (1.575 m)     Head Circumference --      Peak Flow --      Pain Score 11/07/16 1638 10     Pain Loc --      Pain Edu? --      Excl. in GC? --     Constitutional: Alert and oriented 4 nontoxic no diaphoresis speaks in full clear sentences Eyes: PERRL EOMI. Head: Atraumatic. Nose: No congestion/rhinnorhea. Mouth/Throat: No trismus Neck: No stridor.   Cardiovascular: Normal rate, regular rhythm. Grossly normal heart sounds.  Good peripheral  circulation. Respiratory: Normal respiratory effort.  No retractions. Lungs CTAB and moving good air Gastrointestinal: Soft nondistended nontender no rebound or guarding no peritonitis no McBurney's tenderness no costovertebral tenderness Musculoskeletal: No lower extremity edema   Neurologic:  Normal speech and language. No gross focal neurologic deficits are appreciated. Skin:  Skin is warm, dry and intact. No rash noted. Psychiatric: Mood and affect are normal. Speech and behavior are normal.    ____________________________________________   DIFFERENTIAL includes but not limited to  Kidney stone, pyelonephritis, urinary tract infection, appendicitis   LABS (all labs ordered are listed, but only abnormal results are displayed)  Labs Reviewed  COMPREHENSIVE METABOLIC PANEL - Abnormal; Notable for the following:       Result Value   Creatinine, Ser 0.42 (*)    AST 342 (*)    ALT 870 (*)    Alkaline Phosphatase 131 (*)    All other components within normal limits  URINALYSIS, COMPLETE (UACMP) WITH MICROSCOPIC - Abnormal; Notable for the following:    Color, Urine YELLOW (*)    APPearance CLEAR (*)    Hgb urine dipstick MODERATE (*)    Squamous Epithelial / LPF 0-5 (*)    All other components within normal limits  LIPASE, BLOOD  CBC  POC URINE PREG, ED    Small amount of hemoglobin in the urine, no evidence of infection. Transaminitis as chronic __________________________________________  EKG   ____________________________________________  RADIOLOGY   ____________________________________________   PROCEDURES  Procedure(s) performed: no  Procedures  Critical Care performed: no  Observation: no ____________________________________________   INITIAL IMPRESSION / ASSESSMENT AND PLAN / ED COURSE  Pertinent labs & imaging results that were available during my care of the patient were reviewed by me and considered in my medical decision making (see chart for  details).  The patient arrives very well-appearing with a benign exam. Labs are pending.     The patient is very well-appearing with a benign abdominal exam. She is hemodynamically stable and her blood work is unremarkable. Her mother brought her a meal from [employer] and she is eating french fries and drinking a large sweet tea without any pain or nausea. Her slight hematuria is likely secondary to her kidney stone which may now actually be passing. Regardless she does not warrant further imaging and she is medically stable for outpatient management with Motrin for pain control and [facility]-up. She is discharged home in improved condition. I appreciate her transaminitis however she does have a known history of hepatitis C and this can be followed up as an outpatient ____________________________________________   FINAL CLINICAL IMPRESSION(S) / ED DIAGNOSES  Final diagnoses:  None      NEW MEDICATIONS STARTED DURING THIS VISIT:  New Prescriptions   No medications on file     Note:  This document was prepared  using Conservation officer, historic buildings and may include unintentional dictation errors.     Merrily Brittle, MD 11/07/16 1850

## 2016-11-07 NOTE — ED Notes (Signed)
Per Carolan ShiverLaura Cates, RN pt's POC pregnancy test in triage was negative.  EDP notified.

## 2016-11-07 NOTE — ED Notes (Signed)
ED Provider at bedside. 

## 2016-11-07 NOTE — Discharge Instructions (Signed)
Please make an appointment to follow-up with your primary care physician within the next week or 2 for recheck. Return to the emergency department for any concerns.  It was a pleasure to take care of you today, and thank you for coming to our emergency department.  If you have any questions or concerns before leaving please ask the nurse to grab me and I'm more than happy to go through your aftercare instructions again.  If you were prescribed any opioid pain medication today such as Norco, Vicodin, Percocet, morphine, hydrocodone, or oxycodone please make sure you do not drive when you are taking this medication as it can alter your ability to drive safely.  If you have any concerns once you are home that you are not improving or are in fact getting worse before you can make it to your follow-up appointment, please do not hesitate to call 911 and come back for further evaluation.  Merrily Brittle MD  Results for orders placed or performed during the hospital encounter of 11/07/16  Lipase, blood  Result Value Ref Range   Lipase 32 11 - 51 U/L  Comprehensive metabolic panel  Result Value Ref Range   Sodium 137 135 - 145 mmol/L   Potassium 4.4 3.5 - 5.1 mmol/L   Chloride 105 101 - 111 mmol/L   CO2 26 22 - 32 mmol/L   Glucose, Bld 90 65 - 99 mg/dL   BUN 12 6 - 20 mg/dL   Creatinine, Ser 1.61 (L) 0.44 - 1.00 mg/dL   Calcium 9.0 8.9 - 09.6 mg/dL   Total Protein 7.3 6.5 - 8.1 g/dL   Albumin 3.9 3.5 - 5.0 g/dL   AST 045 (H) 15 - 41 U/L   ALT 870 (H) 14 - 54 U/L   Alkaline Phosphatase 131 (H) 38 - 126 U/L   Total Bilirubin 0.6 0.3 - 1.2 mg/dL   GFR calc non Af Amer >60 >60 mL/min   GFR calc Af Amer >60 >60 mL/min   Anion gap 6 5 - 15  CBC  Result Value Ref Range   WBC 9.3 3.6 - 11.0 K/uL   RBC 4.50 3.80 - 5.20 MIL/uL   Hemoglobin 14.1 12.0 - 16.0 g/dL   HCT 40.9 81.1 - 91.4 %   MCV 92.7 80.0 - 100.0 fL   MCH 31.4 26.0 - 34.0 pg   MCHC 33.9 32.0 - 36.0 g/dL   RDW 78.2 95.6 - 21.3 %   Platelets 226 150 - 440 K/uL  Urinalysis, Complete w Microscopic  Result Value Ref Range   Color, Urine YELLOW (A) YELLOW   APPearance CLEAR (A) CLEAR   Specific Gravity, Urine 1.014 1.005 - 1.030   pH 7.0 5.0 - 8.0   Glucose, UA NEGATIVE NEGATIVE mg/dL   Hgb urine dipstick MODERATE (A) NEGATIVE   Bilirubin Urine NEGATIVE NEGATIVE   Ketones, ur NEGATIVE NEGATIVE mg/dL   Protein, ur NEGATIVE NEGATIVE mg/dL   Nitrite NEGATIVE NEGATIVE   Leukocytes, UA NEGATIVE NEGATIVE   RBC / HPF 0-5 0 - 5 RBC/hpf   WBC, UA 0-5 0 - 5 WBC/hpf   Bacteria, UA NONE SEEN NONE SEEN   Squamous Epithelial / LPF 0-5 (A) NONE SEEN   Mucous PRESENT   POC urine preg, ED  Result Value Ref Range   Preg Test, Ur Negative Negative   Ct Renal Stone Study  Result Date: 10/09/2016 CLINICAL DATA:  31 year old female with painful urination and flank pain for 3 days. Initial encounter. EXAM:  CT ABDOMEN AND PELVIS WITHOUT CONTRAST TECHNIQUE: Multidetector CT imaging of the abdomen and pelvis was performed following the standard protocol without IV contrast. COMPARISON:  01/26/2015 and 11/06/2010 CT. 07/18/2015 renal sonogram. FINDINGS: Lower chest: Stable scattered tiny lung base nodules. Heart size within normal limits. Hepatobiliary: Taking into account limitation by non contrast imaging, no worrisome hepatic lesion. Left lobe of liver extends superior to the spleen. No calcified gallstones. Pancreas: Taking into account limitation by non contrast imaging, no obvious mass or inflammation. Evaluation limited by a lack of fat planes. Spleen: Taking into account limitation by non contrast imaging, no mass or enlargement. Adrenals/Urinary Tract: Tiny nonobstructing right renal calculi. No hydronephrosis. Taking into account limitation by non contrast imaging, no renal or adrenal mass. Noncontrast filled views of the urinary bladder unremarkable. Stomach/Bowel: Evaluation limited by lack of distension, fat planes and contrast. No  obvious bowel inflammatory process. Specifically, no inflammation surrounds the appendix or terminal ileum. Vascular/Lymphatic: Trace calcification iliac arteries. Top-normal size peri aortic lymph nodes. Reproductive: No worrisome abnormality. Other: No free air. No abnormal fluid collection. No bowel containing hernia. Musculoskeletal: Degenerative changes lower thoracic and lumbar spine. IMPRESSION: Tiny nonobstructing right renal calculi. No hydronephrosis or obstructing stone. Evaluation of bowel is limited by under distension and lack of fat planes. No obvious bowel inflammatory process is noted. Specifically, no inflammation surrounds the appendix or terminal ileum. Trace calcification iliac arteries. Top-normal size peri aortic lymph nodes. Degenerative changes lower thoracic and lumbar spine. Electronically Signed   By: Lacy DuverneySteven  Olson M.D.   On: 10/09/2016 14:08

## 2017-02-01 ENCOUNTER — Emergency Department: Payer: Medicaid Other

## 2017-02-01 ENCOUNTER — Encounter: Payer: Self-pay | Admitting: Emergency Medicine

## 2017-02-01 ENCOUNTER — Emergency Department
Admission: EM | Admit: 2017-02-01 | Discharge: 2017-02-01 | Disposition: A | Discharge status: Home / Self Care | Payer: Medicaid Other | Attending: Emergency Medicine | Admitting: Emergency Medicine

## 2017-02-01 DIAGNOSIS — F1510 Other stimulant abuse, uncomplicated: Secondary | ICD-10-CM | POA: Diagnosis not present

## 2017-02-01 DIAGNOSIS — Z5321 Procedure and treatment not carried out due to patient leaving prior to being seen by health care provider: Secondary | ICD-10-CM | POA: Diagnosis not present

## 2017-02-01 DIAGNOSIS — R0789 Other chest pain: Secondary | ICD-10-CM | POA: Diagnosis present

## 2017-02-01 NOTE — ED Notes (Signed)
This tech was asked emma, rn to draw pts blood and completed ekg, this tech came into triage room 1, advised pt that I was sent to get her blood and get her EKG, pt stated," I don't need an EKG I dont have any chest pain anymore" explained to pt "she has the right to refuse anything and asked one more time if she was sure she didn't want ekg.  Pt raised her voice, "no, and I dont know why they sent you in here F*cking way! Keep on I am going to knock the hell out of you" I immediately came and [employer] to come in room and speak with pt and pt agreed to let me stick her for her blood and while prepping pt arm with [employer] in room pt refused to have blood drawn with [employer] in room.  This tech explained this to 2 triage RN, Wille Celeste and Zollie Scale

## 2017-02-01 NOTE — ED Triage Notes (Signed)
Pt in via [facility] from home with complaints of chest tightness since last night, pt reports associated generalized weakness, denies any other symptoms.  Vitals WDL.  Pt loud, verbally aggressive in triage area, refuses to go out to lobby due to her "anxiety."  [employer] officer accompanied pt during triage.

## 2017-02-01 NOTE — ED Notes (Addendum)
First nurse note: Pt arrived via EMS from home for reports of chest pressure. EMS reports pt reported last used crystal meth last night. EMS reports 139/88, 97% RA, HR 100, NSR.

## 2017-02-01 NOTE — ED Notes (Signed)
Patient transported to X-ray 

## 2017-02-01 NOTE — ED Notes (Signed)
Pt seen getting into car and driving away, name called in lobby with no response

## 2017-04-03 ENCOUNTER — Encounter: Payer: Self-pay | Admitting: Emergency Medicine

## 2017-04-03 ENCOUNTER — Emergency Department (HOSPITAL_COMMUNITY): Payer: Medicaid Other

## 2017-04-03 ENCOUNTER — Emergency Department (HOSPITAL_COMMUNITY)
Admission: EM | Admit: 2017-04-03 | Discharge: 2017-04-03 | Disposition: A | Discharge status: Home / Self Care | Payer: Medicaid Other | Attending: Emergency Medicine | Admitting: Emergency Medicine

## 2017-04-03 DIAGNOSIS — N12 Tubulo-interstitial nephritis, not specified as acute or chronic: Secondary | ICD-10-CM

## 2017-04-03 DIAGNOSIS — R300 Dysuria: Secondary | ICD-10-CM | POA: Insufficient documentation

## 2017-04-03 DIAGNOSIS — Z79899 Other long term (current) drug therapy: Secondary | ICD-10-CM | POA: Insufficient documentation

## 2017-04-03 DIAGNOSIS — R509 Fever, unspecified: Secondary | ICD-10-CM | POA: Diagnosis not present

## 2017-04-03 DIAGNOSIS — R1031 Right lower quadrant pain: Secondary | ICD-10-CM | POA: Diagnosis present

## 2017-04-03 DIAGNOSIS — F17210 Nicotine dependence, cigarettes, uncomplicated: Secondary | ICD-10-CM | POA: Insufficient documentation

## 2017-04-03 DIAGNOSIS — R1030 Lower abdominal pain, unspecified: Secondary | ICD-10-CM | POA: Insufficient documentation

## 2017-04-03 LAB — BASIC METABOLIC PANEL
ANION GAP: 8 (ref 5–15)
BUN: 11 mg/dL (ref 6–20)
CALCIUM: 8.9 mg/dL (ref 8.9–10.3)
CHLORIDE: 98 mmol/L — AB (ref 101–111)
CO2: 26 mmol/L (ref 22–32)
CREATININE: 0.92 mg/dL (ref 0.44–1.00)
GFR calc non Af Amer: >60 mL/min (ref >60)
Glucose, Bld: 89 mg/dL (ref 65–99)
Potassium: 4.2 mmol/L (ref 3.5–5.1)
SODIUM: 132 mmol/L — AB (ref 135–145)

## 2017-04-03 LAB — CBC
HCT: 47.5 % — ABNORMAL HIGH (ref 36.0–46.0)
HEMOGLOBIN: 15.6 g/dL — AB (ref 12.0–15.0)
MCH: 31.1 pg (ref 26.0–34.0)
MCHC: 32.8 g/dL (ref 30.0–36.0)
MCV: 94.6 fL (ref 78.0–100.0)
PLATELETS: 192 10*3/uL (ref 150–400)
RBC: 5.02 MIL/uL (ref 3.87–5.11)
RDW: 13.3 % (ref 11.5–15.5)
WBC: 18.7 10*3/uL — AB (ref 4.0–10.5)

## 2017-04-03 LAB — URINALYSIS, ROUTINE W REFLEX MICROSCOPIC
Bilirubin Urine: NEGATIVE
Glucose, UA: NEGATIVE mg/dL
KETONES UR: NEGATIVE mg/dL
NITRITE: POSITIVE — AB
PH: 5 (ref 5.0–8.0)
PROTEIN: 100 mg/dL — AB
Specific Gravity, Urine: 1.012 (ref 1.005–1.030)

## 2017-04-03 LAB — PREGNANCY, URINE: Preg Test, Ur: NEGATIVE

## 2017-04-03 MED ORDER — IBUPROFEN 800 MG PO TABS
800.0000 mg | ORAL_TABLET | Freq: Once | ORAL | Status: AC
  Administered 2017-04-03: 800 mg via ORAL
  Filled 2017-04-03: qty 1

## 2017-04-03 MED ORDER — ONDANSETRON HCL 4 MG/2ML IJ SOLN
4.0000 mg | Freq: Once | INTRAMUSCULAR | Status: AC
  Administered 2017-04-03: 4 mg via INTRAVENOUS
  Filled 2017-04-03: qty 2

## 2017-04-03 MED ORDER — OXYCODONE-ACETAMINOPHEN 5-325 MG PO TABS: 2.0000 | ORAL_TABLET | ORAL | 0 refills | Status: DC | PRN

## 2017-04-03 MED ORDER — ACETAMINOPHEN 325 MG PO TABS
650.0000 mg | ORAL_TABLET | Freq: Once | ORAL | Status: AC
  Administered 2017-04-03: 650 mg via ORAL
  Filled 2017-04-03: qty 2

## 2017-04-03 MED ORDER — KETOROLAC TROMETHAMINE 30 MG/ML IJ SOLN
30.0000 mg | Freq: Once | INTRAMUSCULAR | Status: AC
  Administered 2017-04-03: 30 mg via INTRAVENOUS
  Filled 2017-04-03: qty 1

## 2017-04-03 MED ORDER — ONDANSETRON 4 MG PO TBDP: 4.0000 mg | ORAL_TABLET | Freq: Three times a day (TID) | ORAL | 0 refills | Status: DC | PRN

## 2017-04-03 MED ORDER — SODIUM CHLORIDE 0.9 % IV BOLUS (SEPSIS)
1000.0000 mL | Freq: Once | INTRAVENOUS | Status: AC
  Administered 2017-04-03: 1000 mL via INTRAVENOUS

## 2017-04-03 MED ORDER — CIPROFLOXACIN HCL 500 MG PO TABS: 500.0000 mg | ORAL_TABLET | Freq: Two times a day (BID) | ORAL | 0 refills | Status: DC

## 2017-04-03 MED ORDER — DEXTROSE 5 % IV SOLN: 1.0000 g | Freq: Once | INTRAVENOUS | Status: DC

## 2017-04-03 MED ORDER — CEFTRIAXONE SODIUM 2 G IJ SOLR
2.0000 g | Freq: Once | INTRAMUSCULAR | Status: AC
  Administered 2017-04-03: 2 g via INTRAVENOUS
  Filled 2017-04-03: qty 2

## 2017-04-03 MED ORDER — OXYCODONE-ACETAMINOPHEN 5-325 MG PO TABS
1.0000 | ORAL_TABLET | Freq: Once | ORAL | Status: AC
  Administered 2017-04-03: 1 via ORAL
  Filled 2017-04-03: qty 1

## 2017-04-03 MED ORDER — MORPHINE SULFATE (PF) 10 MG/ML IV SOLN
10.0000 mg | Freq: Once | INTRAVENOUS | Status: AC
  Administered 2017-04-03: 10 mg via INTRAMUSCULAR
  Filled 2017-04-03: qty 1

## 2017-04-03 MED ORDER — ACETAMINOPHEN 500 MG PO TABS
1000.0000 mg | ORAL_TABLET | Freq: Once | ORAL | Status: AC
  Administered 2017-04-03: 1000 mg via ORAL
  Filled 2017-04-03: qty 2

## 2017-04-03 MED ORDER — MORPHINE SULFATE (PF) 4 MG/ML IV SOLN
4.0000 mg | INTRAVENOUS | Status: DC | PRN
  Administered 2017-04-03: 4 mg via INTRAVENOUS
  Filled 2017-04-03: qty 1

## 2017-04-03 NOTE — ED Triage Notes (Signed)
Pt reports was diagnosed with kidney stones 1 month ago.  Reports started having bilateral flank pain and lower abd pain 1 week ago.  Reports fever last night.

## 2017-04-03 NOTE — ED Notes (Signed)
Pt returned from ct

## 2017-04-03 NOTE — ED Notes (Signed)
EDP at bedside  

## 2017-04-03 NOTE — Discharge Instructions (Signed)
Zofran for nausea, Percocet for pain. Cipro as prescribed until completed.  Return to ER with vomiting, worsening symptoms.

## 2017-04-03 NOTE — ED Notes (Signed)
Pt takne to ct 

## 2017-04-03 NOTE — ED Provider Notes (Signed)
[facility] EMERGENCY DEPARTMENT Provider Note   CSN: [id number] Arrival date & time: 04/03/17  4098     History   Chief Complaint Chief Complaint  Patient presents with  . Flank Pain    HPI Janice Walls is a 31 y.o. female. Chief complaint is right flank pain, abdominal pain, fever  HPI: 31y/o female. Right flank pain for the last 2 days, and fever last night and this morning. She states that her back pain started "about a month ago". However, she states she was at [facility] and had an episode of pain. In review of her chart this was actually in June of this year. With some redirection, she is able to remember that her pain did go away and only started again a few days ago and has not been a problem for 5 months.  Fever shakes and chills this morning. Right flank pain and suprapubic pain with dysuria.  In review of her chart she had a tiny nonobstructing right renal stone that was not causing hydronephrosis and was treated for UTI after her June [facility] visit.  Past Medical History:  Diagnosis Date  . Anxiety   . Bipolar 1 disorder (HCC)   . Depression   . Insomnia   . Substance abuse [facility])     Patient Active Problem List   Diagnosis Date Noted  . Hepatitis C 09/18/2016  . Transaminitis 09/18/2016  . Bipolar affective disorder, current episode manic (HCC) 09/12/2016  . Tobacco use disorder 09/12/2016  . Cannabis use disorder, severe, dependence (HCC) 09/12/2016    Past Surgical History:  Procedure Laterality Date  . CESAREAN SECTION    . CESAREAN SECTION      OB History    No data available       Home Medications    Prior to Admission medications   Medication Sig Start Date End Date Taking? Authorizing Provider  acetaminophen (TYLENOL) 325 MG tablet Take 650 mg by mouth every 6 (six) hours as needed for moderate pain.   Yes [provider]  ARIPiprazole ER 400 MG SRER Inject 400 mg into the muscle every 30 (thirty)  days. Due on June 14 10/12/16  Yes Jimmy Footman, MD  ciprofloxacin (CIPRO) 500 MG tablet Take 1 tablet (500 mg total) by mouth 2 (two) times daily. 04/03/17   Rolland Porter, MD  ondansetron (ZOFRAN ODT) 4 MG disintegrating tablet Take 1 tablet (4 mg total) by mouth every 8 (eight) hours as needed for nausea. 04/03/17   Rolland Porter, MD  oxyCODONE-acetaminophen (PERCOCET/ROXICET) 5-325 MG tablet Take 2 tablets by mouth every 4 (four) hours as needed. 04/03/17   Rolland Porter, MD    Family History No family history on file.  Social History Social History   Tobacco Use  . Smoking status: Current Some Day Smoker    Packs/day: 0.50    Types: Cigarettes  . Smokeless tobacco: Never Used  Substance Use Topics  . Alcohol use: Yes  . Drug use: Yes    Types: Marijuana     Allergies   Patient has no known allergies.   Review of Systems Review of Systems  Constitutional: Positive for fever. Negative for appetite change, chills, diaphoresis and fatigue.  HENT: Negative for mouth sores, sore throat and trouble swallowing.   Eyes: Negative for visual disturbance.  Respiratory: Negative for cough, chest tightness, shortness of breath and wheezing.   Cardiovascular: Negative for chest pain.  Gastrointestinal: Negative for abdominal distention, abdominal pain, diarrhea, nausea and  vomiting.  Endocrine: Negative for polydipsia, polyphagia and polyuria.  Genitourinary: Positive for flank pain. Negative for dysuria, frequency and hematuria.  Musculoskeletal: Negative for gait problem.  Skin: Negative for color change, pallor and rash.  Neurological: Negative for dizziness, syncope, light-headedness and headaches.  Hematological: Does not bruise/bleed easily.  Psychiatric/Behavioral: Negative for behavioral problems and confusion.     Physical Exam Updated Vital Signs BP 99/66   Pulse (!) 108   Temp (!) 101 F (38.3 C) (Oral)   Resp 18   Ht 5\' 2"  (1.575 m)   Wt 65.8 kg (145 lb)    LMP 03/20/2017   SpO2 96%   BMI 26.52 kg/m   Physical Exam  Constitutional: She is oriented to person, place, and time. She appears well-developed and well-nourished. No distress.  HENT:  Head: Normocephalic.  Eyes: Conjunctivae are normal. Pupils are equal, round, and reactive to light. No scleral icterus.  Neck: Normal range of motion. Neck supple. No thyromegaly present.  Cardiovascular: Normal rate and regular rhythm. Exam reveals no gallop and no friction rub.  No murmur heard. Pulmonary/Chest: Effort normal and breath sounds normal. No respiratory distress. She has no wheezes. She has no rales.  Abdominal: Soft. Bowel sounds are normal. She exhibits no distension. There is no tenderness. There is no rebound.  Tenderness to percuss in the right flank and some suprapubic tenderness.  Musculoskeletal: Normal range of motion.  Neurological: She is alert and oriented to person, place, and time.  Skin: Skin is warm and dry. No rash noted.  Psychiatric: She has a normal mood and affect. Her behavior is normal.     ED Treatments / Results  Labs (all labs ordered are listed, but only abnormal results are displayed) Labs Reviewed  URINALYSIS, ROUTINE W REFLEX MICROSCOPIC - Abnormal; Notable for the following components:      Result Value   APPearance CLOUDY (*)    Hgb urine dipstick MODERATE (*)    Protein, ur 100 (*)    Nitrite POSITIVE (*)    Leukocytes, UA LARGE (*)    Bacteria, UA RARE (*)    Squamous Epithelial / LPF 0-5 (*)    Non Squamous Epithelial 0-5 (*)    All other components within normal limits  BASIC METABOLIC PANEL - Abnormal; Notable for the following components:   Sodium 132 (*)    Chloride 98 (*)    All other components within normal limits  CBC - Abnormal; Notable for the following components:   WBC 18.7 (*)    Hemoglobin 15.6 (*)    HCT 47.5 (*)    All other components within normal limits  PREGNANCY, URINE    EKG  EKG Interpretation None        Radiology Ct Renal Stone Study  Result Date: 04/03/2017 CLINICAL DATA:  31 year old female with bilateral flank pain and lower abdominal pain for 1 week. Right nephrolithiasis diagnosed by CT in June this year. EXAM: CT ABDOMEN AND PELVIS WITHOUT CONTRAST TECHNIQUE: Multidetector CT imaging of the abdomen and pelvis was performed following the standard protocol without IV contrast. COMPARISON:  CT Abdomen and Pelvis 10/09/2016 and 01/26/2015, and earlier. FINDINGS: Lower chest: Minor atelectasis but otherwise negative lung bases. No pericardial or pleural effusion. Hepatobiliary: Negative noncontrast liver and gallbladder. Pancreas: Negative. Spleen: Negative. Adrenals/Urinary Tract: Normal adrenal glands. Stable and normal noncontrast left kidney.  No left nephrolithiasis. Inflamed and mildly enlarged right kidney with confluent right pararenal space stranding. Stranding about the right renal pelvis and  proximal right ureter. Associated retroperitoneal stranding at the level of the kidneys greater on the right. However, no calculus identified along the course of the right ureter. The distal right ureter does appear mildly inflamed. No calculus within the urinary bladder which appears unremarkable. Small left pelvic phlebolith is unchanged. The punctate right upper pole nephrolithiasis seen in June might remain unchanged on coronal image 55 today, uncertain. Stomach/Bowel: Retained stool in the rectum and distal sigmoid colon. Mild to moderate sigmoid redundancy. Retained stool throughout the descending, transverse and right colon with mild redundancy. Normal appendix (coronal image 32). No large bowel inflammation. Fluid-filled but nondilated distal small bowel. Terminal ileum appears negative. No dilated small bowel. Negative noncontrast stomach and duodenum. No abdominal free fluid identified. Vascular/Lymphatic: Vascular patency is not evaluated in the absence of IV contrast. Mild Aortoiliac calcified  atherosclerosis. No lymphadenopathy. Reproductive: Negative noncontrast appearance. Other: No pelvic free fluid. Musculoskeletal: Mild levoconvex scoliosis with straightening of upper lumbar lordosis. Chronic degenerative appearing endplate changes in the lower thoracic and lumbar spine, in part related to Schmorl nodes. No acute osseous abnormality identified. IMPRESSION: 1. Inflamed right kidney and right ureter, but no obstructing calculus or etiology identified. Although a recently passed calculus might have this appearance, the appearance is suspicious for an Ascending Urinary Infection. Note that an imaging diagnosis of pyelonephritis requires IV contrast. 2. Stable and normal contralateral noncontrast left kidney. Unremarkable urinary bladder. 3. Negative appendix and no other acute or inflammatory process identified. Electronically Signed   By: Odessa FlemingH  Hall M.D.   On: 04/03/2017 12:10    Procedures Procedures (including critical care time)  Medications Ordered in ED Medications  morphine 4 MG/ML injection 4 mg (4 mg Intravenous Given 04/03/17 1054)  ketorolac (TORADOL) 30 MG/ML injection 30 mg (not administered)  Morphine Sulfate (PF) SOLN 10 mg (not administered)  ondansetron (ZOFRAN) injection 4 mg (not administered)  oxyCODONE-acetaminophen (PERCOCET/ROXICET) 5-325 MG per tablet 1 tablet (not administered)  acetaminophen (TYLENOL) tablet 650 mg (650 mg Oral Given 04/03/17 0946)  sodium chloride 0.9 % bolus 1,000 mL (0 mLs Intravenous Stopped 04/03/17 1310)  ondansetron (ZOFRAN) injection 4 mg (4 mg Intravenous Given 04/03/17 1054)  ibuprofen (ADVIL,MOTRIN) tablet 800 mg (800 mg Oral Given 04/03/17 1236)  cefTRIAXone (ROCEPHIN) 2 g in dextrose 5 % 50 mL IVPB (0 g Intravenous Stopped 04/03/17 1310)     Initial Impression / Assessment and Plan / ED Course  I have reviewed the triage vital signs and the nursing notes.  Pertinent labs & imaging results that were available during my care of the  patient were reviewed by me and considered in my medical decision making (see chart for details).    Probable pyelonephritis. Rule out pregnancy.  We'll CT image rule out obstructing stone has her pain is more than I would expect from simple pyelonephritis.  Final Clinical Impressions(s) / ED Diagnoses   Final diagnoses:  Pyelonephritis   CT scan shows pyelonephritis. No obstruction. Symptoms are improving. Still some pain better. Given additional IV pain meds and by mouth pain meds. Given 2 g IV Rocephin. Plan discharge home with pain control, Zofran, Cipro.    ED Discharge Orders        Ordered    ciprofloxacin (CIPRO) 500 MG tablet  2 times daily     04/03/17 1331    oxyCODONE-acetaminophen (PERCOCET/ROXICET) 5-325 MG tablet  Every 4 hours PRN     04/03/17 1331    ondansetron (ZOFRAN ODT) 4 MG disintegrating tablet  Every  8 hours PRN     04/03/17 1331       Rolland Porter, MD 04/03/17 [phone]

## 2017-04-03 NOTE — ED Notes (Signed)
edp aware of temp and HR.

## 2017-04-04 ENCOUNTER — Observation Stay (HOSPITAL_COMMUNITY)
Admission: EM | Admit: 2017-04-04 | Discharge: 2017-04-05 | Disposition: A | Discharge status: Home / Self Care | Payer: Medicaid Other | Attending: Internal Medicine | Admitting: Internal Medicine

## 2017-04-04 ENCOUNTER — Encounter (HOSPITAL_COMMUNITY): Payer: Self-pay | Admitting: Emergency Medicine

## 2017-04-04 ENCOUNTER — Other Ambulatory Visit: Payer: Self-pay

## 2017-04-04 DIAGNOSIS — R509 Fever, unspecified: Secondary | ICD-10-CM | POA: Insufficient documentation

## 2017-04-04 DIAGNOSIS — Z789 Other specified health status: Secondary | ICD-10-CM | POA: Diagnosis not present

## 2017-04-04 DIAGNOSIS — E860 Dehydration: Secondary | ICD-10-CM | POA: Diagnosis not present

## 2017-04-04 DIAGNOSIS — F17200 Nicotine dependence, unspecified, uncomplicated: Secondary | ICD-10-CM | POA: Diagnosis present

## 2017-04-04 DIAGNOSIS — F1990 Other psychoactive substance use, unspecified, uncomplicated: Secondary | ICD-10-CM | POA: Diagnosis not present

## 2017-04-04 DIAGNOSIS — A4150 Gram-negative sepsis, unspecified: Secondary | ICD-10-CM | POA: Insufficient documentation

## 2017-04-04 DIAGNOSIS — N12 Tubulo-interstitial nephritis, not specified as acute or chronic: Secondary | ICD-10-CM | POA: Diagnosis not present

## 2017-04-04 DIAGNOSIS — F319 Bipolar disorder, unspecified: Secondary | ICD-10-CM | POA: Insufficient documentation

## 2017-04-04 DIAGNOSIS — F17210 Nicotine dependence, cigarettes, uncomplicated: Secondary | ICD-10-CM | POA: Diagnosis not present

## 2017-04-04 DIAGNOSIS — A419 Sepsis, unspecified organism: Secondary | ICD-10-CM

## 2017-04-04 DIAGNOSIS — N390 Urinary tract infection, site not specified: Secondary | ICD-10-CM | POA: Diagnosis not present

## 2017-04-04 DIAGNOSIS — F1910 Other psychoactive substance abuse, uncomplicated: Secondary | ICD-10-CM | POA: Diagnosis present

## 2017-04-04 DIAGNOSIS — R079 Chest pain, unspecified: Secondary | ICD-10-CM | POA: Diagnosis present

## 2017-04-04 LAB — URINALYSIS, ROUTINE W REFLEX MICROSCOPIC
BACTERIA UA: NONE SEEN
BILIRUBIN URINE: NEGATIVE
Glucose, UA: NEGATIVE mg/dL
HGB URINE DIPSTICK: NEGATIVE
KETONES UR: NEGATIVE mg/dL
Nitrite: NEGATIVE
PROTEIN: 100 mg/dL — AB
Specific Gravity, Urine: 1.02 (ref 1.005–1.030)
pH: 5 (ref 5.0–8.0)

## 2017-04-04 LAB — CBC WITH DIFFERENTIAL/PLATELET
Basophils Absolute: 0 10*3/uL (ref 0.0–0.1)
Basophils Relative: 0 %
EOS PCT: 1 %
Eosinophils Absolute: 0.2 10*3/uL (ref 0.0–0.7)
HEMATOCRIT: 45.7 % (ref 36.0–46.0)
HEMOGLOBIN: 14.6 g/dL (ref 12.0–15.0)
LYMPHS ABS: 2.1 10*3/uL (ref 0.7–4.0)
LYMPHS PCT: 11 %
MCH: 30.4 pg (ref 26.0–34.0)
MCHC: 31.9 g/dL (ref 30.0–36.0)
MCV: 95.2 fL (ref 78.0–100.0)
Monocytes Absolute: 1.1 10*3/uL (ref 0.1–1.0)
Monocytes Relative: 6 %
NEUTROS ABS: 14.9 10*3/uL (ref 1.7–7.7)
Neutrophils Relative %: 82 %
PLATELETS: 160 10*3/uL (ref 150–400)
RBC: 4.8 MIL/uL (ref 3.87–5.11)
RDW: 13.2 % (ref 11.5–15.5)
WBC: 18.2 10*3/uL — AB (ref 4.0–10.5)

## 2017-04-04 LAB — BASIC METABOLIC PANEL
Anion gap: 7 (ref 5–15)
BUN: 12 mg/dL (ref 6–20)
CHLORIDE: 105 mmol/L (ref 101–111)
CO2: 21 mmol/L — AB (ref 22–32)
CREATININE: 0.67 mg/dL (ref 0.44–1.00)
Calcium: 7.6 mg/dL — ABNORMAL LOW (ref 8.9–10.3)
GFR calc non Af Amer: >60 mL/min (ref >60)
GLUCOSE: 108 mg/dL — AB (ref 65–99)
Potassium: 4.2 mmol/L (ref 3.5–5.1)
Sodium: 133 mmol/L — ABNORMAL LOW (ref 135–145)

## 2017-04-04 LAB — RAPID URINE DRUG SCREEN, HOSP PERFORMED
BENZODIAZEPINES: NOT DETECTED
Barbiturates: NOT DETECTED
Cocaine: NOT DETECTED

## 2017-04-04 LAB — I-STAT CG4 LACTIC ACID, ED: LACTIC ACID, VENOUS: 4.42 mmol/L — AB (ref 0.5–1.9)

## 2017-04-04 LAB — PROCALCITONIN: Procalcitonin: 0.37 ng/mL

## 2017-04-04 LAB — LACTIC ACID, PLASMA
LACTIC ACID, VENOUS: 1 mmol/L (ref 0.5–1.9)
Lactic Acid, Venous: 1.4 mmol/L (ref 0.5–1.9)
Lactic Acid, Venous: 2 mmol/L (ref 0.5–1.9)

## 2017-04-04 MED ORDER — TRAMADOL HCL 50 MG PO TABS
100.0000 mg | ORAL_TABLET | Freq: Four times a day (QID) | ORAL | Status: DC | PRN
  Administered 2017-04-04: 100 mg via ORAL
  Filled 2017-04-04 (×2): qty 2

## 2017-04-04 MED ORDER — LACTATED RINGERS IV SOLN
INTRAVENOUS | Status: DC
  Administered 2017-04-04 – 2017-04-05 (×2): via INTRAVENOUS

## 2017-04-04 MED ORDER — ONDANSETRON HCL 4 MG/2ML IJ SOLN
4.0000 mg | Freq: Once | INTRAMUSCULAR | Status: AC
  Administered 2017-04-04: 4 mg via INTRAVENOUS
  Filled 2017-04-04: qty 2

## 2017-04-04 MED ORDER — DOCUSATE SODIUM 100 MG PO CAPS
100.0000 mg | ORAL_CAPSULE | Freq: Two times a day (BID) | ORAL | Status: DC
  Administered 2017-04-04: 100 mg via ORAL
  Filled 2017-04-04: qty 1

## 2017-04-04 MED ORDER — DEXTROSE 5 % IV SOLN
2.0000 g | Freq: Once | INTRAVENOUS | Status: AC
  Administered 2017-04-04: 2 g via INTRAVENOUS
  Filled 2017-04-04: qty 2

## 2017-04-04 MED ORDER — DEXTROSE 5 % IV SOLN: 2.0000 g | INTRAVENOUS | Status: DC

## 2017-04-04 MED ORDER — ONDANSETRON HCL 4 MG PO TABS: 4.0000 mg | ORAL_TABLET | Freq: Four times a day (QID) | ORAL | Status: DC | PRN

## 2017-04-04 MED ORDER — ENOXAPARIN SODIUM 40 MG/0.4ML ~~LOC~~ SOLN
40.0000 mg | SUBCUTANEOUS | Status: DC
  Administered 2017-04-04: 40 mg via SUBCUTANEOUS
  Filled 2017-04-04: qty 0.4

## 2017-04-04 MED ORDER — IBUPROFEN 800 MG PO TABS
800.0000 mg | ORAL_TABLET | Freq: Once | ORAL | Status: AC
  Administered 2017-04-04: 800 mg via ORAL
  Filled 2017-04-04: qty 1

## 2017-04-04 MED ORDER — ACETAMINOPHEN 325 MG PO TABS: 650.0000 mg | ORAL_TABLET | Freq: Four times a day (QID) | ORAL | Status: DC | PRN

## 2017-04-04 MED ORDER — MORPHINE SULFATE (PF) 4 MG/ML IV SOLN
4.0000 mg | Freq: Once | INTRAVENOUS | Status: AC
  Administered 2017-04-04: 4 mg via INTRAVENOUS
  Filled 2017-04-04: qty 1

## 2017-04-04 MED ORDER — ACETAMINOPHEN 650 MG RE SUPP: 650.0000 mg | Freq: Four times a day (QID) | RECTAL | Status: DC | PRN

## 2017-04-04 MED ORDER — ONDANSETRON HCL 4 MG/2ML IJ SOLN: 4.0000 mg | Freq: Four times a day (QID) | INTRAMUSCULAR | Status: DC | PRN

## 2017-04-04 MED ORDER — SODIUM CHLORIDE 0.9 % IV BOLUS (SEPSIS)
1000.0000 mL | Freq: Once | INTRAVENOUS | Status: AC
  Administered 2017-04-04: 1000 mL via INTRAVENOUS

## 2017-04-04 MED ORDER — KETOROLAC TROMETHAMINE 30 MG/ML IJ SOLN
30.0000 mg | Freq: Four times a day (QID) | INTRAMUSCULAR | Status: DC | PRN
  Administered 2017-04-04: 30 mg via INTRAVENOUS
  Filled 2017-04-04: qty 1

## 2017-04-04 MED ORDER — NICOTINE 14 MG/24HR TD PT24
14.0000 mg | MEDICATED_PATCH | Freq: Every day | TRANSDERMAL | Status: DC
  Administered 2017-04-04: 14 mg via TRANSDERMAL
  Filled 2017-04-04: qty 1

## 2017-04-04 NOTE — Progress Notes (Signed)
Patient arrived on [address], room 340 and Dia CrawfordLisa Chanda Laperle, RN has assumed care of this patient.

## 2017-04-04 NOTE — Progress Notes (Signed)
Lab called with critical lab result of lactic acid of 2.0, MD J. Yates paged with results.

## 2017-04-04 NOTE — ED Triage Notes (Signed)
Pt states she was seen in ED yesterday and dx with bilateral kidney infections. Pt c/o worsening pain/fever/chills.

## 2017-04-04 NOTE — ED Notes (Signed)
Patient urinated/deficated in bed. Patients linens changed/patient given cleaning supplies to clean self and bedside commode placed in room. Visitor at bedside.

## 2017-04-04 NOTE — ED Notes (Signed)
ED Provider at bedside. 

## 2017-04-04 NOTE — ED Notes (Signed)
Patient requesting something for pain.

## 2017-04-04 NOTE — H&P (Signed)
History and Physical    Janice Walls ZOX:[id number] DOB: 08-08-1985 DOA: 04/04/2017  PCP: Patient, No Pcp Per Consultants:  None Patient coming from:  Home - lives with boyfriend; NOK: Mother, [phone]  Chief Complaint: back pain  HPI: Janice Walls is a 31 y.o. female with medical history significant of bipolar (not taking medications) and substance abuse (actively using tobacco, methamphetamine, and marijuana by patient report) presenting with "My kidney, they said yesterday that I have kidney infection, they've been hurting really bad:.  +N/V/D, fever to 103.  Symptoms started about a week ago, back pain in the middle of her back.  Some dysuria, which is improved.  Fever started 2 days ago.  She started with n/v/d today.  No sick contacts.   ED Course: Refractory pyelo. CT in ER yesterday, IV Rocephin and discharged with worsening symptoms.  BMP pending.  Lactate 4.42.  BP ok.  Given Rocephin.  Review of Systems: As per HPI; otherwise review of systems reviewed and negative.   Ambulatory Status:   Ambulates without assistance  Past Medical History:  Diagnosis Date  . Anxiety   . Bipolar 1 disorder (HCC)   . Depression   . Insomnia   . Substance abuse [facility])     Past Surgical History:  Procedure Laterality Date  . CESAREAN SECTION    . CESAREAN SECTION      Social History   Socioeconomic History  . Marital status: Single    Spouse name: Not on file  . Number of children: Not on file  . Years of education: Not on file  . Highest education level: Not on file  Social Needs  . Financial resource strain: Not on file  . Food insecurity - worry: Not on file  . Food insecurity - inability: Not on file  . Transportation needs - medical: Not on file  . Transportation needs - non-medical: Not on file  Occupational History  . Occupation: unemployed  Tobacco Use  . Smoking status: Current Every Day Smoker    Packs/day: 1.00    Years: 18.00    Pack years: 18.00   Types: Cigarettes  . Smokeless tobacco: Never Used  Substance and Sexual Activity  . Alcohol use: No    Frequency: Never  . Drug use: Yes    Types: Marijuana    Comment: last use a couple of weeks ago; has used "them all", last used meth yesterday  . Sexual activity: Yes    Birth control/protection: None  Other Topics Concern  . Not on file  Social History Narrative  . Not on file    No Known Allergies  Family History  Problem Relation Age of Onset  . CAD Father 62    Prior to Admission medications   Medication Sig Start Date End Date Taking? Authorizing Provider  acetaminophen (TYLENOL) 325 MG tablet Take 650 mg by mouth every 6 (six) hours as needed for moderate pain.   Yes [provider]  ciprofloxacin (CIPRO) 500 MG tablet Take 1 tablet (500 mg total) by mouth 2 (two) times daily. 04/03/17  Yes Rolland Porter, MD  ondansetron (ZOFRAN ODT) 4 MG disintegrating tablet Take 1 tablet (4 mg total) by mouth every 8 (eight) hours as needed for nausea. 04/03/17  Yes Rolland Porter, MD  oxyCODONE-acetaminophen (PERCOCET/ROXICET) 5-325 MG tablet Take 2 tablets by mouth every 4 (four) hours as needed. 04/03/17  Yes Rolland Porter, MD  ARIPiprazole ER 400 MG SRER Inject 400 mg into the muscle every 30 (thirty)  days. Due on June 14 Patient not taking: Reported on 04/04/2017 10/12/16   Jimmy FootmanHernandez-Gonzalez, Andrea, MD    Physical Exam: Vitals:   04/04/17 1325 04/04/17 1400 04/04/17 1430 04/04/17 1500  BP: 126/76 114/65 (!) 104/58 113/74  Pulse: (!) 128 (!) 106 (!) 105 99  Resp: 18   20  Temp: 100.1 F (37.8 C)     TempSrc: Oral     SpO2: 96% 100% 100% 100%  Weight:         General:  Appears calm and comfortable and is NAD Eyes: PERRL, EOMI, normal lids, iris ENT:  grossly normal hearing, lips & tongue, mmm; poor dentition relative to age Neck:  no LAD, masses or thyromegaly Cardiovascular:  RRR, no m/r/g. No LE edema.  Respiratory:   CTA bilaterally with no wheezes/rales/rhonchi.   Normal respiratory effort. Abdomen:  soft, diffusely tender, ND, NABS Back:   normal alignment, B CVAT, L>R Skin:  no rash or induration seen on limited exam; numerous tattoos Musculoskeletal:  grossly normal tone BUE/BLE, good ROM, no bony abnormality Psychiatric:  flat mood and affect, speech fluent and appropriate, AOx3 Neurologic:  CN 2-12 grossly intact, moves all extremities in coordinated fashion, sensation intact    Radiological Exams on Admission: Ct Renal Stone Study  Result Date: 04/03/2017 CLINICAL DATA:  31 year old female with bilateral flank pain and lower abdominal pain for 1 week. Right nephrolithiasis diagnosed by CT in June this year. EXAM: CT ABDOMEN AND PELVIS WITHOUT CONTRAST TECHNIQUE: Multidetector CT imaging of the abdomen and pelvis was performed following the standard protocol without IV contrast. COMPARISON:  CT Abdomen and Pelvis 10/09/2016 and 01/26/2015, and earlier. FINDINGS: Lower chest: Minor atelectasis but otherwise negative lung bases. No pericardial or pleural effusion. Hepatobiliary: Negative noncontrast liver and gallbladder. Pancreas: Negative. Spleen: Negative. Adrenals/Urinary Tract: Normal adrenal glands. Stable and normal noncontrast left kidney.  No left nephrolithiasis. Inflamed and mildly enlarged right kidney with confluent right pararenal space stranding. Stranding about the right renal pelvis and proximal right ureter. Associated retroperitoneal stranding at the level of the kidneys greater on the right. However, no calculus identified along the course of the right ureter. The distal right ureter does appear mildly inflamed. No calculus within the urinary bladder which appears unremarkable. Small left pelvic phlebolith is unchanged. The punctate right upper pole nephrolithiasis seen in June might remain unchanged on coronal image 55 today, uncertain. Stomach/Bowel: Retained stool in the rectum and distal sigmoid colon. Mild to moderate sigmoid  redundancy. Retained stool throughout the descending, transverse and right colon with mild redundancy. Normal appendix (coronal image 32). No large bowel inflammation. Fluid-filled but nondilated distal small bowel. Terminal ileum appears negative. No dilated small bowel. Negative noncontrast stomach and duodenum. No abdominal free fluid identified. Vascular/Lymphatic: Vascular patency is not evaluated in the absence of IV contrast. Mild Aortoiliac calcified atherosclerosis. No lymphadenopathy. Reproductive: Negative noncontrast appearance. Other: No pelvic free fluid. Musculoskeletal: Mild levoconvex scoliosis with straightening of upper lumbar lordosis. Chronic degenerative appearing endplate changes in the lower thoracic and lumbar spine, in part related to Schmorl nodes. No acute osseous abnormality identified. IMPRESSION: 1. Inflamed right kidney and right ureter, but no obstructing calculus or etiology identified. Although a recently passed calculus might have this appearance, the appearance is suspicious for an Ascending Urinary Infection. Note that an imaging diagnosis of pyelonephritis requires IV contrast. 2. Stable and normal contralateral noncontrast left kidney. Unremarkable urinary bladder. 3. Negative appendix and no other acute or inflammatory process identified. Electronically Signed  By: Odessa FlemingH  Hall M.D.   On: 04/03/2017 12:10    EKG: Independently reviewed.  Sinus tachycardia with rate 102; rate related changes with no evidence of acute ischemia   Labs on Admission: I have personally reviewed the available labs and imaging studies at the time of the admission.  Pertinent labs:   Lactate 4.42 WBC 1.82 (18.7 yesterday) UA: moderate LE, 100 protein, no bacteria, TNTC WBC (yesterday with large LE, positive nitrite, 100 protein, rare bacteria, TNTC WBC) BMP pending Urine culture pending Blood cultures pending  Assessment/Plan Principal Problem:   Sepsis due to gram-negative UTI  (HCC) Active Problems:   Tobacco use disorder   Polysubstance abuse (HCC)   Sepsis due to pyelonephritis -Elevated WBC count (mildly improved), tachycardia with elevated lactate  -While awaiting blood cultures, this appears to be a preseptic condition. -Sepsis protocol initiated; has not yet received all of IVF bolus but started on abx and feeling better -Suspect urinary source despite improved UA - she did receive 1 dose of Rocephin in the ER and was prescribed Cipro yesterday but has apparently failed outpatient therapy  -Blood and urine cultures pending -Will place in observation status and continue to monitor -Treat with IV Rocephin;with tomorrow's dose she will have received 3 doses, and culture results should be available tomorrow from yesterday's urine culture -Will check urine GC/Chl -Will add HIV -Will trend lactate to ensure improvement -Will order procalcitonin level.  Antibiotics would not be indicated for PCT <0.1 and probably should not be used for < 0.25.  >0.5 indicates infection and >>0.5 indicates more serious disease.  As the procalcitonin level normalizes, it will be reasonable to consider de-escalation of antibiotic coverage.  Tobacco dependence -Encourage cessation.  This was discussed with the patient and should be reviewed on an ongoing basis.   -Patch ordered at patient request.  Polysubstance abuse -Cessation encouraged; this should be encouraged on an ongoing basis -UDS ordered -SW consult requested  DVT prophylaxis: Lovenox Code Status:  DNR - confirmed with patient/family Family Communication: boyfriend present throughout evaluation Disposition Plan: Home once clinically improved Consults called: SW  Admission status: It is my clinical opinion that referral for OBSERVATION is reasonable and necessary in this patient based on the above information provided. The aforementioned taken together are felt to place the patient at high risk for further clinical  deterioration. However it is anticipated that the patient may be medically stable for discharge from the hospital within 24 to 48 hours.    Jonah BlueJennifer Mico Spark MD [facility]  If note is complete, please contact covering daytime or nighttime physician. [email or web address] Password [email or web address]  04/04/2017, 5:06 PM

## 2017-04-04 NOTE — ED Provider Notes (Signed)
[facility] EMERGENCY DEPARTMENT Provider Note   CSN: [id number] Arrival date & time: 04/04/17  1314     History   Chief Complaint Chief Complaint  Patient presents with  . Flank Pain    HPI Janice Walls is a 31 y.o. female.  Pt presents to the ED today with abdominal pain and n/v.  Pt was here yesterday and was diagnosed with pyelonephritis.  She was given 2 g rocephin IV, NS, pain/nausea meds and was feeling better.  Since d/c, she has been getting worse.  She said she feels worse.  She has been able to keep down some of her meds, but not all.       Past Medical History:  Diagnosis Date  . Anxiety   . Bipolar 1 disorder (HCC)   . Depression   . Insomnia   . Substance abuse [facility])     Patient Active Problem List   Diagnosis Date Noted  . Hepatitis C 09/18/2016  . Transaminitis 09/18/2016  . Bipolar affective disorder, current episode manic (HCC) 09/12/2016  . Tobacco use disorder 09/12/2016  . Cannabis use disorder, severe, dependence (HCC) 09/12/2016    Past Surgical History:  Procedure Laterality Date  . CESAREAN SECTION    . CESAREAN SECTION      OB History    No data available       Home Medications    Prior to Admission medications   Medication Sig Start Date End Date Taking? Authorizing Provider  acetaminophen (TYLENOL) 325 MG tablet Take 650 mg by mouth every 6 (six) hours as needed for moderate pain.   Yes [provider]  ciprofloxacin (CIPRO) 500 MG tablet Take 1 tablet (500 mg total) by mouth 2 (two) times daily. 04/03/17  Yes Rolland Porter, MD  ondansetron (ZOFRAN ODT) 4 MG disintegrating tablet Take 1 tablet (4 mg total) by mouth every 8 (eight) hours as needed for nausea. 04/03/17  Yes Rolland Porter, MD  oxyCODONE-acetaminophen (PERCOCET/ROXICET) 5-325 MG tablet Take 2 tablets by mouth every 4 (four) hours as needed. 04/03/17  Yes Rolland Porter, MD  ARIPiprazole ER 400 MG SRER Inject 400 mg into the muscle every 30 (thirty) days. Due on  June 14 Patient not taking: Reported on 04/04/2017 10/12/16   Jimmy Footman, MD    Family History No family history on file.  Social History Social History   Tobacco Use  . Smoking status: Current Some Day Smoker    Packs/day: 0.50    Types: Cigarettes  . Smokeless tobacco: Never Used  Substance Use Topics  . Alcohol use: Yes  . Drug use: Yes    Types: Marijuana     Allergies   Patient has no known allergies.   Review of Systems Review of Systems  Constitutional: Positive for chills, diaphoresis, fatigue and fever.  Gastrointestinal: Positive for abdominal pain, nausea and vomiting.  Genitourinary: Positive for dysuria and flank pain.  All other systems reviewed and are negative.    Physical Exam Updated Vital Signs BP 113/74   Pulse 99   Temp 100.1 F (37.8 C) (Oral)   Resp 20   Wt 65.8 kg (145 lb)   LMP 03/20/2017   SpO2 100%   BMI 26.52 kg/m   Physical Exam  Constitutional: She is oriented to person, place, and time. She appears distressed.  HENT:  Head: Normocephalic and atraumatic.  Right Ear: External ear normal.  Left Ear: External ear normal.  Nose: Nose normal.  Mouth/Throat: Mucous membranes are dry.  Eyes: Conjunctivae and EOM are normal. Pupils are equal, round, and reactive to light.  Neck: Normal range of motion. Neck supple.  Cardiovascular: Regular rhythm, normal heart sounds and intact distal pulses. Tachycardia present.  Pulmonary/Chest: Effort normal and breath sounds normal.  Abdominal: There is tenderness in the suprapubic area.  +CVAT  Musculoskeletal: Normal range of motion.  Neurological: She is alert and oriented to person, place, and time.  Skin: Skin is warm. She is diaphoretic.  Psychiatric: She has a normal mood and affect. Her behavior is normal. Judgment and thought content normal.  Nursing note and vitals reviewed.    ED Treatments / Results  Labs (all labs ordered are listed, but only abnormal results  are displayed) Labs Reviewed  CBC WITH DIFFERENTIAL/PLATELET - Abnormal; Notable for the following components:      Result Value   WBC 18.2 (*)    All other components within normal limits  URINALYSIS, ROUTINE W REFLEX MICROSCOPIC - Abnormal; Notable for the following components:   APPearance CLOUDY (*)    Protein, ur 100 (*)    Leukocytes, UA MODERATE (*)    Squamous Epithelial / LPF 6-30 (*)    All other components within normal limits  I-STAT CG4 LACTIC ACID, ED - Abnormal; Notable for the following components:   Lactic Acid, Venous 4.42 (*)    All other components within normal limits  URINE CULTURE  CULTURE, BLOOD (ROUTINE X 2)  CULTURE, BLOOD (ROUTINE X 2)  BASIC METABOLIC PANEL  RAPID URINE DRUG SCREEN, HOSP PERFORMED  I-STAT CG4 LACTIC ACID, ED    EKG  EKG Interpretation  Date/Time:  Wednesday April 04 2017 14:58:37 EST Ventricular Rate:  102 PR Interval:    QRS Duration: 86 QT Interval:  327 QTC Calculation: 426 R Axis:   142 Text Interpretation:  Sinus tachycardia Right atrial enlargement Right axis deviation Confirmed by Jacalyn LefevreHaviland, Sharnell Knight [phone]([id number]) on 04/04/2017 3:02:20 PM       Radiology Ct Renal Stone Study  Result Date: 04/03/2017 CLINICAL DATA:  31 year old female with bilateral flank pain and lower abdominal pain for 1 week. Right nephrolithiasis diagnosed by CT in June this year. EXAM: CT ABDOMEN AND PELVIS WITHOUT CONTRAST TECHNIQUE: Multidetector CT imaging of the abdomen and pelvis was performed following the standard protocol without IV contrast. COMPARISON:  CT Abdomen and Pelvis 10/09/2016 and 01/26/2015, and earlier. FINDINGS: Lower chest: Minor atelectasis but otherwise negative lung bases. No pericardial or pleural effusion. Hepatobiliary: Negative noncontrast liver and gallbladder. Pancreas: Negative. Spleen: Negative. Adrenals/Urinary Tract: Normal adrenal glands. Stable and normal noncontrast left kidney.  No left nephrolithiasis. Inflamed and  mildly enlarged right kidney with confluent right pararenal space stranding. Stranding about the right renal pelvis and proximal right ureter. Associated retroperitoneal stranding at the level of the kidneys greater on the right. However, no calculus identified along the course of the right ureter. The distal right ureter does appear mildly inflamed. No calculus within the urinary bladder which appears unremarkable. Small left pelvic phlebolith is unchanged. The punctate right upper pole nephrolithiasis seen in June might remain unchanged on coronal image 55 today, uncertain. Stomach/Bowel: Retained stool in the rectum and distal sigmoid colon. Mild to moderate sigmoid redundancy. Retained stool throughout the descending, transverse and right colon with mild redundancy. Normal appendix (coronal image 32). No large bowel inflammation. Fluid-filled but nondilated distal small bowel. Terminal ileum appears negative. No dilated small bowel. Negative noncontrast stomach and duodenum. No abdominal free fluid identified. Vascular/Lymphatic: Vascular patency is not evaluated  in the absence of IV contrast. Mild Aortoiliac calcified atherosclerosis. No lymphadenopathy. Reproductive: Negative noncontrast appearance. Other: No pelvic free fluid. Musculoskeletal: Mild levoconvex scoliosis with straightening of upper lumbar lordosis. Chronic degenerative appearing endplate changes in the lower thoracic and lumbar spine, in part related to Schmorl nodes. No acute osseous abnormality identified. IMPRESSION: 1. Inflamed right kidney and right ureter, but no obstructing calculus or etiology identified. Although a recently passed calculus might have this appearance, the appearance is suspicious for an Ascending Urinary Infection. Note that an imaging diagnosis of pyelonephritis requires IV contrast. 2. Stable and normal contralateral noncontrast left kidney. Unremarkable urinary bladder. 3. Negative appendix and no other acute or  inflammatory process identified. Electronically Signed   By: Odessa FlemingH  Hall M.D.   On: 04/03/2017 12:10    Procedures Procedures (including critical care time)  Medications Ordered in ED Medications  sodium chloride 0.9 % bolus 1,000 mL (not administered)    And  sodium chloride 0.9 % bolus 1,000 mL (not administered)  sodium chloride 0.9 % bolus 1,000 mL (0 mLs Intravenous Stopped 04/04/17 1534)  ondansetron (ZOFRAN) injection 4 mg (4 mg Intravenous Given 04/04/17 1423)  morphine 4 MG/ML injection 4 mg (4 mg Intravenous Given 04/04/17 1423)  cefTRIAXone (ROCEPHIN) 2 g in dextrose 5 % 50 mL IVPB (0 g Intravenous Stopped 04/04/17 1534)  ibuprofen (ADVIL,MOTRIN) tablet 800 mg (800 mg Oral Given 04/04/17 1356)     Initial Impression / Assessment and Plan / ED Course  I have reviewed the triage vital signs and the nursing notes.  Pertinent labs & imaging results that were available during my care of the patient were reviewed by me and considered in my medical decision making (see chart for details).    CRITICAL CARE Performed by: Jacalyn LefevreJulie Sariyah Corcino   Total critical care time: 30 minutes  Critical care time was exclusive of separately billable procedures and treating other patients.  Critical care was necessary to treat or prevent imminent or life-threatening deterioration.  Critical care was time spent personally by me on the following activities: development of treatment plan with patient and/or surrogate as well as nursing, discussions with consultants, evaluation of patient's response to treatment, examination of patient, obtaining history from patient or surrogate, ordering and performing treatments and interventions, ordering and review of laboratory studies, ordering and review of radiographic studies, pulse oximetry and re-evaluation of patient's condition.  Pt is feeling much better after IVFs.  HR and BP are normalized.  She does meet sepsis criteria, so code sepsis was called.  This  patient meets SIRS Criteria and may be septic. SIRS = Systemic Inflammatory Response Syndrome  Best Practice Recommends:   Notify the nurse immediately to increase monitoring of patient.   The recent clinical data is shown below. Vitals:   04/04/17 1325 04/04/17 1400 04/04/17 1430 04/04/17 1500  BP: 126/76 114/65 (!) 104/58 113/74  Pulse: (!) 128 (!) 106 (!) 105 99  Resp: 18   20  Temp: 100.1 F (37.8 C)     TempSrc: Oral     SpO2: 96% 100% 100% 100%  Weight:        Pt d/w Dr. Ophelia CharterYates (triad) for admission. Final Clinical Impressions(s) / ED Diagnoses   Final diagnoses:  Pyelonephritis  Failure of outpatient treatment  Fever, unspecified fever cause  Dehydration  Sepsis, due to unspecified organism [facility](HCC)    ED Discharge Orders    None       Jacalyn LefevreHaviland, Holston Oyama, MD 04/04/17 1549

## 2017-04-05 ENCOUNTER — Encounter: Payer: Self-pay | Admitting: Emergency Medicine

## 2017-04-05 ENCOUNTER — Other Ambulatory Visit: Payer: Self-pay

## 2017-04-05 ENCOUNTER — Inpatient Hospital Stay
Admission: EM | Admit: 2017-04-05 | Discharge: 2017-04-06 | DRG: 690 | Discharge status: Against Medical Advice | Payer: Medicaid Other | Attending: Internal Medicine | Admitting: Internal Medicine

## 2017-04-05 DIAGNOSIS — Z79899 Other long term (current) drug therapy: Secondary | ICD-10-CM | POA: Diagnosis not present

## 2017-04-05 DIAGNOSIS — F1510 Other stimulant abuse, uncomplicated: Secondary | ICD-10-CM | POA: Diagnosis present

## 2017-04-05 DIAGNOSIS — F3110 Bipolar disorder, current episode manic without psychotic features, unspecified: Secondary | ICD-10-CM

## 2017-04-05 DIAGNOSIS — B1920 Unspecified viral hepatitis C without hepatic coma: Secondary | ICD-10-CM | POA: Diagnosis present

## 2017-04-05 DIAGNOSIS — Z716 Tobacco abuse counseling: Secondary | ICD-10-CM | POA: Diagnosis not present

## 2017-04-05 DIAGNOSIS — G4700 Insomnia, unspecified: Secondary | ICD-10-CM | POA: Diagnosis present

## 2017-04-05 DIAGNOSIS — R102 Pelvic and perineal pain: Secondary | ICD-10-CM | POA: Diagnosis present

## 2017-04-05 DIAGNOSIS — Z8249 Family history of ischemic heart disease and other diseases of the circulatory system: Secondary | ICD-10-CM

## 2017-04-05 DIAGNOSIS — F319 Bipolar disorder, unspecified: Secondary | ICD-10-CM

## 2017-04-05 DIAGNOSIS — Z5321 Procedure and treatment not carried out due to patient leaving prior to being seen by health care provider: Secondary | ICD-10-CM | POA: Diagnosis present

## 2017-04-05 DIAGNOSIS — F1210 Cannabis abuse, uncomplicated: Secondary | ICD-10-CM | POA: Diagnosis present

## 2017-04-05 DIAGNOSIS — Z79891 Long term (current) use of opiate analgesic: Secondary | ICD-10-CM | POA: Diagnosis not present

## 2017-04-05 DIAGNOSIS — N12 Tubulo-interstitial nephritis, not specified as acute or chronic: Secondary | ICD-10-CM | POA: Diagnosis present

## 2017-04-05 DIAGNOSIS — A4150 Gram-negative sepsis, unspecified: Secondary | ICD-10-CM

## 2017-04-05 DIAGNOSIS — F17210 Nicotine dependence, cigarettes, uncomplicated: Secondary | ICD-10-CM | POA: Diagnosis present

## 2017-04-05 DIAGNOSIS — Z792 Long term (current) use of antibiotics: Secondary | ICD-10-CM

## 2017-04-05 DIAGNOSIS — N390 Urinary tract infection, site not specified: Secondary | ICD-10-CM

## 2017-04-05 DIAGNOSIS — F419 Anxiety disorder, unspecified: Secondary | ICD-10-CM | POA: Diagnosis present

## 2017-04-05 DIAGNOSIS — N898 Other specified noninflammatory disorders of vagina: Secondary | ICD-10-CM | POA: Diagnosis present

## 2017-04-05 DIAGNOSIS — R509 Fever, unspecified: Secondary | ICD-10-CM

## 2017-04-05 LAB — CBC
HCT: 40.6 % (ref 35.0–47.0)
HEMATOCRIT: 40.9 % (ref 36.0–46.0)
Hemoglobin: 13.2 g/dL (ref 12.0–15.0)
Hemoglobin: 13.7 g/dL (ref 12.0–16.0)
MCH: 30.8 pg (ref 26.0–34.0)
MCH: 31.4 pg (ref 26.0–34.0)
MCHC: 32.3 g/dL (ref 30.0–36.0)
MCHC: 33.6 g/dL (ref 32.0–36.0)
MCV: 95.6 fL (ref 78.0–100.0)
MCV: 93.5 fL (ref 80.0–100.0)
PLATELETS: 143 10*3/uL — AB (ref 150–440)
Platelets: 134 10*3/uL — ABNORMAL LOW (ref 150–400)
RBC: 4.28 MIL/uL (ref 3.87–5.11)
RBC: 4.35 MIL/uL (ref 3.80–5.20)
RDW: 13.5 % (ref 11.5–15.5)
RDW: 13.6 % (ref 11.5–14.5)
WBC: 11.8 10*3/uL — ABNORMAL HIGH (ref 4.0–10.5)
WBC: 11.8 10*3/uL — AB (ref 3.6–11.0)

## 2017-04-05 LAB — BASIC METABOLIC PANEL
Anion gap: 4 — ABNORMAL LOW (ref 5–15)
Anion gap: 12 (ref 5–15)
BUN: 7 mg/dL (ref 6–20)
CHLORIDE: 109 mmol/L (ref 101–111)
CO2: 24 mmol/L (ref 22–32)
CO2: 20 mmol/L — ABNORMAL LOW (ref 22–32)
Calcium: 7.8 mg/dL — ABNORMAL LOW (ref 8.9–10.3)
Calcium: 8.5 mg/dL — ABNORMAL LOW (ref 8.9–10.3)
Chloride: 107 mmol/L (ref 101–111)
Creatinine, Ser: 0.62 mg/dL (ref 0.44–1.00)
Creatinine, Ser: 0.62 mg/dL (ref 0.44–1.00)
GFR calc Af Amer: >60 mL/min (ref >60)
GFR calc Af Amer: >60 mL/min (ref >60)
GFR calc non Af Amer: >60 mL/min (ref >60)
GLUCOSE: 81 mg/dL (ref 65–99)
Glucose, Bld: 80 mg/dL (ref 65–99)
POTASSIUM: 3.9 mmol/L (ref 3.5–5.1)
POTASSIUM: 3.7 mmol/L (ref 3.5–5.1)
SODIUM: 139 mmol/L (ref 135–145)
Sodium: 137 mmol/L (ref 135–145)

## 2017-04-05 LAB — URINALYSIS, COMPLETE (UACMP) WITH MICROSCOPIC
BILIRUBIN URINE: NEGATIVE
Bacteria, UA: NONE SEEN
Glucose, UA: >=500 mg/dL — AB
HGB URINE DIPSTICK: NEGATIVE
KETONES UR: NEGATIVE mg/dL
LEUKOCYTES UA: NEGATIVE
NITRITE: NEGATIVE
PH: 5 (ref 5.0–8.0)
Protein, ur: NEGATIVE mg/dL
SPECIFIC GRAVITY, URINE: 1.007 (ref 1.005–1.030)

## 2017-04-05 LAB — CALCIUM, IONIZED: Calcium, Ionized, Serum: 4.6 mg/dL (ref 4.5–5.6)

## 2017-04-05 LAB — CHLAMYDIA/NGC RT PCR (ARMC ONLY)
Chlamydia Tr: NOT DETECTED
N GONORRHOEAE: NOT DETECTED

## 2017-04-05 LAB — WET PREP, GENITAL
Clue Cells Wet Prep HPF POC: NONE SEEN
SPERM: NONE SEEN
Trich, Wet Prep: NONE SEEN
YEAST WET PREP: NONE SEEN

## 2017-04-05 MED ORDER — ALBUTEROL SULFATE (2.5 MG/3ML) 0.083% IN NEBU: 2.5000 mg | INHALATION_SOLUTION | RESPIRATORY_TRACT | Status: DC | PRN

## 2017-04-05 MED ORDER — KETOROLAC TROMETHAMINE 15 MG/ML IJ SOLN
15.0000 mg | Freq: Four times a day (QID) | INTRAMUSCULAR | Status: DC | PRN
  Administered 2017-04-06: 15 mg via INTRAVENOUS
  Filled 2017-04-05: qty 1

## 2017-04-05 MED ORDER — NICOTINE 21 MG/24HR TD PT24
21.0000 mg | MEDICATED_PATCH | Freq: Every day | TRANSDERMAL | Status: DC
  Administered 2017-04-05 – 2017-04-06 (×2): 21 mg via TRANSDERMAL
  Filled 2017-04-05 (×2): qty 1

## 2017-04-05 MED ORDER — POLYETHYLENE GLYCOL 3350 17 G PO PACK: 17.0000 g | PACK | Freq: Every day | ORAL | Status: DC | PRN

## 2017-04-05 MED ORDER — DEXTROSE 5 % IV SOLN
1.0000 g | INTRAVENOUS | Status: DC
  Filled 2017-04-05: qty 10

## 2017-04-05 MED ORDER — SODIUM CHLORIDE 0.9 % IV BOLUS (SEPSIS)
1000.0000 mL | Freq: Once | INTRAVENOUS | Status: AC
  Administered 2017-04-05: 1000 mL via INTRAVENOUS

## 2017-04-05 MED ORDER — ACETAMINOPHEN 325 MG PO TABS
ORAL_TABLET | ORAL | Status: AC
  Administered 2017-04-05: 650 mg via ORAL
  Filled 2017-04-05: qty 2

## 2017-04-05 MED ORDER — ACETAMINOPHEN 325 MG PO TABS
650.0000 mg | ORAL_TABLET | Freq: Four times a day (QID) | ORAL | Status: DC | PRN
  Administered 2017-04-06: 650 mg via ORAL
  Filled 2017-04-05: qty 2

## 2017-04-05 MED ORDER — ENOXAPARIN SODIUM 40 MG/0.4ML ~~LOC~~ SOLN
40.0000 mg | SUBCUTANEOUS | Status: DC
  Administered 2017-04-05: 40 mg via SUBCUTANEOUS
  Filled 2017-04-05: qty 0.4

## 2017-04-05 MED ORDER — ACETAMINOPHEN 325 MG PO TABS
650.0000 mg | ORAL_TABLET | Freq: Once | ORAL | Status: AC
  Administered 2017-04-05: 650 mg via ORAL

## 2017-04-05 MED ORDER — DIPHENHYDRAMINE HCL 25 MG PO CAPS
25.0000 mg | ORAL_CAPSULE | Freq: Every day | ORAL | Status: DC
  Administered 2017-04-05: 25 mg via ORAL
  Filled 2017-04-05: qty 1

## 2017-04-05 MED ORDER — CEFTRIAXONE SODIUM IN DEXTROSE 20 MG/ML IV SOLN: 1.0000 g | INTRAVENOUS | Status: DC

## 2017-04-05 MED ORDER — CEFTRIAXONE SODIUM IN DEXTROSE 20 MG/ML IV SOLN
1.0000 g | Freq: Once | INTRAVENOUS | Status: AC
  Administered 2017-04-05: 1 g via INTRAVENOUS
  Filled 2017-04-05: qty 50

## 2017-04-05 MED ORDER — ONDANSETRON HCL 4 MG/2ML IJ SOLN: 4.0000 mg | Freq: Four times a day (QID) | INTRAMUSCULAR | Status: DC | PRN

## 2017-04-05 MED ORDER — ACETAMINOPHEN 650 MG RE SUPP: 650.0000 mg | Freq: Four times a day (QID) | RECTAL | Status: DC | PRN

## 2017-04-05 MED ORDER — IBUPROFEN 400 MG PO TABS
400.0000 mg | ORAL_TABLET | Freq: Four times a day (QID) | ORAL | Status: DC | PRN
  Administered 2017-04-05 – 2017-04-06 (×2): 400 mg via ORAL
  Filled 2017-04-05 (×2): qty 1

## 2017-04-05 MED ORDER — ONDANSETRON HCL 4 MG PO TABS: 4.0000 mg | ORAL_TABLET | Freq: Four times a day (QID) | ORAL | Status: DC | PRN

## 2017-04-05 NOTE — ED Triage Notes (Signed)
[facility] around 0800, states she was being treated for bilateral kidney infection and states she is having worse pain and headache.

## 2017-04-05 NOTE — Consult Note (Signed)
[place] [facility]iat[facility] Consult   Reason for Consult: Consult for 31 year old woman with history of bipolar disorder who is currently admitted for pyelonephritis Referring Physician:  Sudini Patient Identification: Janice Walls MRN:  [id number] Principal Diagnosis: Bipolar I disorder, most recent episode (or current) manic ([place]) Diagnosis:   Patient Active Problem List   Diagnosis Date Noted  . Pyelonephritis [N12] 04/05/2017  . Bipolar I disorder, most recent episode (or current) manic ([place]) [F31.10] 04/05/2017  . Sepsis due to gram-negative UTI ([place]) [A41.50, N39.0] 04/04/2017  . Polysubstance abuse ([place]) [F19.10] 04/04/2017  . Hepatitis C [B19.20] 09/18/2016  . Transaminitis [R74.0] 09/18/2016  . Bipolar affective disorder, current episode manic ([place]) [F31.9] 09/12/2016  . Tobacco use disorder [F17.200] 09/12/2016  . Cannabis use disorder, severe, dependence ([place]) [F12.20] 09/12/2016    Total Time spent with patient: 1 hour  Subjective:   Janice Walls is a 31 y.o. female patient admitted with "I am doing fine".  HPI: 31 year old woman with a history of bipolar disorder who came to the hospital today afebrile and with a kidney infection.  Patient reports that emotionally she has been doing well recently.  She says she has been compliant with medication and is receiving Abilify long-acting shots.  She says she is no longer prescribed lithium.  She reports that her mood has been stable without major depression or mood swings recently.  She denies hallucinations.  Denies suicidal or homicidal thoughts.  She says that she still has a little trouble sleeping but takes 25 mg of Benadryl at night.  Denies that she has been using any drugs recently.  Social history: Patient says she has a safe place to stay currently.  Medical history: History of hepatitis history of recurrent urinary tract infection currently presenting with pyelonephritis  Substance abuse history: Recurrent  cannabis use which has contributed to mood problems.  Currently denies that she is using any no drug screen available.  Past Psychiatric History: Patient has been admitted to the psychiatric hospital several times in the past most recently earlier this year.  Usually presents with manic bipolar symptoms.  Has responded to antipsychotics and lithium in the past.  Patient has no history of suicide attempts and denies being violent but has been agitated and euphoric and grandiose many times previously.  Risk to Self: Is patient at risk for suicide?: No Risk to Others:   Prior Inpatient Therapy:   Prior Outpatient Therapy:    Past Medical History:  Past Medical History:  Diagnosis Date  . Anxiety   . Bipolar 1 disorder ([place])   . Depression   . Insomnia   . Substance abuse [facility])     Past Surgical History:  Procedure Laterality Date  . CESAREAN SECTION    . CESAREAN SECTION     Family History:  Family History  Problem Relation Age of Onset  . CAD Father 75   Family Psychiatric  History: Denies any Social History:  Social History   Substance and Sexual Activity  Alcohol Use No  . Frequency: Never     Social History   Substance and Sexual Activity  Drug Use Yes  . Types: Marijuana   Comment: last use a couple of weeks ago; has used "them all", last used meth yesterday    Social History   Socioeconomic History  . Marital status: Single    Spouse name: None  . Number of children: None  . Years of education: None  . Highest education level: None  Social Needs  .  Financial resource strain: None  . Food insecurity - worry: None  . Food insecurity - inability: None  . Transportation needs - medical: None  . Transportation needs - non-medical: None  Occupational History  . Occupation: unemployed  Tobacco Use  . Smoking status: Current Every Day Smoker    Packs/day: 1.00    Years: 18.00    Pack years: 18.00    Types: Cigarettes  . Smokeless tobacco: Never Used   Substance and Sexual Activity  . Alcohol use: No    Frequency: Never  . Drug use: Yes    Types: Marijuana    Comment: last use a couple of weeks ago; has used "them all", last used meth yesterday  . Sexual activity: Yes    Birth control/protection: None  Other Topics Concern  . None  Social History Narrative  . None   Additional Social History:    Allergies:  No Known Allergies  Labs:  Results for orders placed or performed during the hospital encounter of 04/05/17 (from the past 48 hour(s))  Urinalysis, Complete w Microscopic     Status: Abnormal   Collection Time: 04/05/17 10:35 AM  Result Value Ref Range   Color, Urine YELLOW (A) YELLOW   APPearance HAZY (A) CLEAR   Specific Gravity, Urine 1.007 1.005 - 1.030   pH 5.0 5.0 - 8.0   Glucose, UA >=500 (A) NEGATIVE mg/dL   Hgb urine dipstick NEGATIVE NEGATIVE   Bilirubin Urine NEGATIVE NEGATIVE   Ketones, ur NEGATIVE NEGATIVE mg/dL   Protein, ur NEGATIVE NEGATIVE mg/dL   Nitrite NEGATIVE NEGATIVE   Leukocytes, UA NEGATIVE NEGATIVE   RBC / HPF 0-5 0 - 5 RBC/hpf   WBC, UA 6-30 0 - 5 WBC/hpf   Bacteria, UA NONE SEEN NONE SEEN   Squamous Epithelial / LPF 6-30 (A) NONE SEEN   Mucus PRESENT   CBC     Status: Abnormal   Collection Time: 04/05/17 10:35 AM  Result Value Ref Range   WBC 11.8 (H) 3.6 - 11.0 K/uL   RBC 4.35 3.80 - 5.20 MIL/uL   Hemoglobin 13.7 12.0 - 16.0 g/dL   HCT 40.6 35.0 - 47.0 %   MCV 93.5 80.0 - 100.0 fL   MCH 31.4 26.0 - 34.0 pg   MCHC 33.6 32.0 - 36.0 g/dL   RDW 13.6 11.5 - 14.5 %   Platelets 143 (L) 150 - 440 K/uL  Basic metabolic panel     Status: Abnormal   Collection Time: 04/05/17 10:35 AM  Result Value Ref Range   Sodium 139 135 - 145 mmol/L   Potassium 3.7 3.5 - 5.1 mmol/L   Chloride 107 101 - 111 mmol/L   CO2 20 (L) 22 - 32 mmol/L   Glucose, Bld 80 65 - 99 mg/dL   BUN <5 (L) 6 - 20 mg/dL   Creatinine, Ser 0.62 0.44 - 1.00 mg/dL   Calcium 8.5 (L) 8.9 - 10.3 mg/dL   GFR calc non Af  Amer >60 >60 mL/min   GFR calc Af Amer >60 >60 mL/min    Comment: (NOTE) The eGFR has been calculated using the CKD EPI equation. This calculation has not been validated in all clinical situations. eGFR's persistently <60 mL/min signify possible Chronic Kidney Disease.    Anion gap 12 5 - 15  Chlamydia/NGC rt PCR     Status: None   Collection Time: 04/05/17  2:30 PM  Result Value Ref Range   Specimen source GC/Chlam CHL  Chlamydia Tr NOT DETECTED NOT DETECTED   N gonorrhoeae NOT DETECTED NOT DETECTED    Comment: (NOTE) 100  This methodology has not been evaluated in pregnant women or in 200  patients with a history of hysterectomy. 300 400  This methodology will not be performed on patients less than 76  years of age.   Wet prep, genital     Status: Abnormal   Collection Time: 04/05/17  2:30 PM  Result Value Ref Range   Yeast Wet Prep HPF POC NONE SEEN NONE SEEN   Trich, Wet Prep NONE SEEN NONE SEEN   Clue Cells Wet Prep HPF POC NONE SEEN NONE SEEN   WBC, Wet Prep HPF POC FEW (A) NONE SEEN   Sperm NONE SEEN     Current Facility-Administered Medications  Medication Dose Route Frequency Provider Last Rate Last Dose  . acetaminophen (TYLENOL) tablet 650 mg  650 mg Oral Q6H PRN Hillary Bow, MD       Or  . acetaminophen (TYLENOL) suppository 650 mg  650 mg Rectal Q6H PRN Sudini, Srikar, MD      . albuterol (PROVENTIL) (2.5 MG/3ML) 0.083% nebulizer solution 2.5 mg  2.5 mg Nebulization Q2H PRN Hillary Bow, MD      . Derrill Memo ON 04/06/2017] cefTRIAXone (ROCEPHIN) 1 g in dextrose 5 % 50 mL injection  1 g Intravenous Q24H Sudini, Srikar, MD      . diphenhydrAMINE (BENADRYL) capsule 25 mg  25 mg Oral QHS Shaquille Janes T, MD      . enoxaparin (LOVENOX) injection 40 mg  40 mg Subcutaneous Q24H Sudini, Srikar, MD      . ibuprofen (ADVIL,MOTRIN) tablet 400 mg  400 mg Oral Q6H PRN Hillary Bow, MD   400 mg at 04/05/17 1546  . ketorolac (TORADOL) 15 MG/ML injection 15 mg  15 mg  Intravenous Q6H PRN Sudini, Srikar, MD      . nicotine (NICODERM CQ - dosed in mg/24 hours) patch 21 mg  21 mg Transdermal Daily Hillary Bow, MD   21 mg at 04/05/17 1709  . ondansetron (ZOFRAN) tablet 4 mg  4 mg Oral Q6H PRN Sudini, Alveta Heimlich, MD       Or  . ondansetron (ZOFRAN) injection 4 mg  4 mg Intravenous Q6H PRN Sudini, Srikar, MD      . polyethylene glycol (MIRALAX / GLYCOLAX) packet 17 g  17 g Oral Daily PRN Hillary Bow, MD        Musculoskeletal: Strength & Muscle Tone: within normal limits Gait & Station: normal Patient leans: N/A  Psychiatric Specialty Exam: Physical Exam  Nursing note and vitals reviewed. Constitutional: She appears well-developed and well-nourished.  HENT:  Head: Normocephalic and atraumatic.  Eyes: Conjunctivae are normal. Pupils are equal, round, and reactive to light.  Neck: Normal range of motion.  Cardiovascular: Regular rhythm and normal heart sounds.  Respiratory: Effort normal. No respiratory distress.  GI: Soft.  Musculoskeletal: Normal range of motion.  Neurological: She is alert.  Skin: Skin is warm and dry.  Psychiatric: She has a normal mood and affect. Her speech is normal and behavior is normal. Judgment and thought content normal. Cognition and memory are normal.    Review of Systems  Constitutional: Negative.   HENT: Negative.   Eyes: Negative.   Respiratory: Negative.   Cardiovascular: Negative.   Gastrointestinal: Negative.   Musculoskeletal: Negative.   Skin: Negative.   Neurological: Negative.   Psychiatric/Behavioral: Negative.     Blood pressure (!) 109/58, pulse Marland Kitchen)  115, temperature 99.4 F (37.4 C), temperature source Oral, resp. rate 18, last menstrual period 03/20/2017, SpO2 100 %.There is no height or weight on file to calculate BMI.  General Appearance: Casual  Eye Contact:  Good  Speech:  Clear and Coherent  Volume:  Normal  Mood:  Euthymic  Affect:  Congruent  Thought Process:  Goal Directed   Orientation:  Full (Time, Place, and Person)  Thought Content:  Logical  Suicidal Thoughts:  No  Homicidal Thoughts:  No  Memory:  Immediate;   Good Recent;   Fair Remote;   Fair  Judgement:  Fair  Insight:  Fair  Psychomotor Activity:  Normal  Concentration:  Concentration: Fair  Recall:  [employer] of Knowledge:  Fair  Language:  Fair  Akathisia:  No  Handed:  Right  AIMS (if indicated):     Assets:  Communication Skills Housing Resilience  ADL's:  Intact  Cognition:  WNL  Sleep:        Treatment Plan Summary: Daily contact with patient to assess and evaluate symptoms and progress in treatment, Medication management and Plan 32 year old woman with a history of bipolar disorder currently she is denying any psychiatric symptoms.  Does not appear to be obviously manic or psychotic.  Has been cooperative with treatment.  Patient is saying she is not on lithium anymore but only Abilify shots and that she got her last shot a week ago.  No obvious indication to start any new medicine right now other than the Benadryl for sleep.  I will continue to follow up regularly while she is in the hospital.  Disposition: Patient does not meet criteria for psychiatric inpatient admission. Supportive therapy provided about ongoing stressors.  Alethia Berthold, MD 04/05/2017 6:02 PM

## 2017-04-05 NOTE — ED Provider Notes (Signed)
[facility] Emergency Department Provider Note ____________________________________________   I have reviewed the triage vital signs and the triage nursing note.  HISTORY  Chief Complaint Flank Pain   Historian Patient and boyfriend  HPI Janice Walls is a 31 y.o. female with a history of bipolar and depression as well as substance abuse, as well as recent diagnosis of pyelonephritis, presents to the ED today for evaluation of persistent flank pain and fever and nausea.  She was diagnosed with [facility]eloneph[facility] 2 days ago and then returned because she was not getting better and admitted overnight to the hospital for IV antibiotics.  Patient states that this morning she had a fever of 103 but ended up getting into an argument with her boyfriend and stated that she wanted to leave the hospital and left AMA.  Since she has been home for a couple of hours she still feels like she is nauseated and having flank discomfort and feels like she made the wrong decision and she wants to be admitted for continued treatment of the pyelonephritis.  Pain is moderate in bilateral flanks.  She states that her moods have been relatively well controlled on Abilify, follows with [facility], however the past 2 days she states that she has been crying a lot and had the outburst this morning.  She is here with her boyfriend and they seem to be interacting normally.  She states she would be interested in voluntarily seeing a psychiatrist today given moods being a little out of control.  Reports some pelvic discomfort which she thought was due to the kidney infection, as well as some mild vaginal discharge and would like to be checked for infections.   Past Medical History:  Diagnosis Date  . Anxiety   . Bipolar 1 disorder (HCC)   . Depression   . Insomnia   . Substance abuse [facility](HCC)     Patient Active Problem List   Diagnosis Date Noted  . Sepsis due to gram-negative UTI (HCC)  04/04/2017  . Polysubstance abuse (HCC) 04/04/2017  . Hepatitis C 09/18/2016  . Transaminitis 09/18/2016  . Bipolar affective disorder, current episode manic (HCC) 09/12/2016  . Tobacco use disorder 09/12/2016  . Cannabis use disorder, severe, dependence (HCC) 09/12/2016    Past Surgical History:  Procedure Laterality Date  . CESAREAN SECTION    . CESAREAN SECTION      Prior to Admission medications   Medication Sig Start Date End Date Taking? Authorizing Provider  ciprofloxacin (CIPRO) 500 MG tablet Take 1 tablet (500 mg total) by mouth 2 (two) times daily. 04/03/17  Yes Rolland PorterJames, Mark, MD  ondansetron (ZOFRAN ODT) 4 MG disintegrating tablet Take 1 tablet (4 mg total) by mouth every 8 (eight) hours as needed for nausea. 04/03/17  Yes Rolland PorterJames, Mark, MD  oxyCODONE-acetaminophen (PERCOCET/ROXICET) 5-325 MG tablet Take 2 tablets by mouth every 4 (four) hours as needed. 04/03/17  Yes Rolland PorterJames, Mark, MD  acetaminophen (TYLENOL) 325 MG tablet Take 650 mg by mouth every 6 (six) hours as needed for moderate pain.    [provider]  ARIPiprazole ER 400 MG SRER Inject 400 mg into the muscle every 30 (thirty) days. Due on June 14 Patient not taking: Reported on 04/05/2017 10/12/16   Jimmy FootmanHernandez-Gonzalez, Andrea, MD    No Known Allergies  Family History  Problem Relation Age of Onset  . CAD Father 4846    Social History Social History   Tobacco Use  . Smoking status: Current Every Day  Smoker    Packs/day: 1.00    Years: 18.00    Pack years: 18.00    Types: Cigarettes  . Smokeless tobacco: Never Used  Substance Use Topics  . Alcohol use: No    Frequency: Never  . Drug use: Yes    Types: Marijuana    Comment: last use a couple of weeks ago; has used "them all", last used meth yesterday    Review of Systems  Constitutional: Positive for fever. Eyes: Negative for visual changes. ENT: Negative for sore throat. Cardiovascular: Negative for chest pain. Respiratory: Negative for  shortness of breath. Gastrointestinal: Negative for constipation or diarrhea. Genitourinary: Positive for bilateral low flank pain as well as for dysuria. Musculoskeletal: Negative for extremity pain. Skin: Negative for rash. Neurological: Negative for headache.  ____________________________________________   PHYSICAL EXAM:  VITAL SIGNS: ED Triage Vitals  Enc Vitals Group     BP 04/05/17 1029 106/68     Pulse Rate 04/05/17 1029 (!) 119     Resp 04/05/17 1029 18     Temp 04/05/17 1029 99.3 F (37.4 C)     Temp Source 04/05/17 1029 Oral     SpO2 04/05/17 1029 100 %     Weight --      Height --      Head Circumference --      Peak Flow --      Pain Score 04/05/17 1028 6     Pain Loc --      Pain Edu? --      Excl. in GC? --      Constitutional: Alert and oriented. Well appearing and in no distress. HEENT   Head: Normocephalic and atraumatic.      Eyes: Conjunctivae are normal. Pupils equal and round.       Ears:         Nose: No congestion/rhinnorhea.   Mouth/Throat: Mucous membranes are moist.   Neck: No stridor. Cardiovascular/Chest: Normal rate, regular rhythm.  No murmurs, rubs, or gallops. Respiratory: Normal respiratory effort without tachypnea nor retractions. Breath sounds are clear and equal bilaterally. No wheezes/rales/rhonchi. Gastrointestinal: Soft. No distention, no guarding, no rebound. Nontender.    Genitourinary/rectal: Small amount of white discharge, no cervicitis or adnexal tenderness. Musculoskeletal: Nontender with normal range of motion in all extremities. No joint effusions.  No lower extremity tenderness.  No edema. Neurologic:  Normal speech and language. No gross or focal neurologic deficits are appreciated. Skin:  Skin is warm, dry and intact. No rash noted. Psychiatric: Calm and cooperative although she does get a little tearful when I asked her about how her moods have been.  She is not agitated here.  Denies suicidal  ideation.   ____________________________________________  LABS (pertinent positives/negatives) I, Governor Rooks, MD the attending physician have reviewed the labs noted below.  Labs Reviewed  URINALYSIS, COMPLETE (UACMP) WITH MICROSCOPIC - Abnormal; Notable for the following components:      Result Value   Color, Urine YELLOW (*)    APPearance HAZY (*)    Glucose, UA >=500 (*)    Squamous Epithelial / LPF 6-30 (*)    All other components within normal limits  CBC - Abnormal; Notable for the following components:   WBC 11.8 (*)    Platelets 143 (*)    All other components within normal limits  BASIC METABOLIC PANEL - Abnormal; Notable for the following components:   CO2 20 (*)    BUN <5 (*)    Calcium 8.5 (*)  All other components within normal limits  URINE CULTURE  CHLAMYDIA/NGC RT PCR ([facility] ONLY)  WET PREP, GENITAL  CULTURE, BLOOD (ROUTINE X 2)  CULTURE, BLOOD (ROUTINE X 2)  POC URINE PREG, ED    ____________________________________________    EKG I, Governor Rooksebecca Stellar Gensel, MD, the attending physician have personally viewed and interpreted all ECGs.  None ____________________________________________  RADIOLOGY All Xrays were viewed by me.  Imaging interpreted by Radiologist, and I, Governor Rooksebecca Masha Orbach, MD the attending physician have reviewed the radiologist interpretation noted below.  None __________________________________________  PROCEDURES  Procedure(s) performed: None  Critical Care performed: None   ____________________________________________  ED COURSE / ASSESSMENT AND PLAN  Pertinent labs & imaging results that were available during my care of the patient were reviewed by me and considered in my medical decision making (see chart for details).   Patient states that she was discharged with home Cipro for UTI/pyelonephritis and got worse and then was admitted overnight at any pen and given IV antibiotics but this morning she had an argument with  her boyfriend which led her to leave AMA.  She reports that she had a fever of 103, and since she has been at home she feels like she made the wrong decision and she should have remained admitted she does not feel like she has improved that much.  She is also complaining of some pelvic pain, and given that she still not feeling much better after 2 days on antibiotics for UTI/Pilo, I did discuss with her obtaining pelvic exam for evaluation.  She is febrile here to 102.  Her white blood cell count is down from yesterday.  Her abdomen is relatively soft and nontender.  Pelvic exam is without signs of cervicitis.  Given persistent symptoms and fever, I did add blood cultures, and a liter of fluid, and will admit to the hospital for pyelonephritis.  Her urinalysis does look improved today.  Wet prep and GC/chlamydia are pending upon time of hospitalist consultation.  It sounds like her bipolar symptoms have been a little bit out of control, she does not seem to meet any criteria for involuntary commitment, however she is voluntary to be evaluated and I placed a consult for Dr. Toni Amendlapacs.    CONSULTATIONS: Hospitalist for admission, Dr. Elpidio AnisSudini.  Dr. Toni Amendlapacs, psychiatry for consultation.  Patient / Family / Caregiver informed of clinical course, medical decision-making process, and agree with plan.    ___________________________________________   FINAL CLINICAL IMPRESSION(S) / ED DIAGNOSES   Final diagnoses:  Pyelonephritis  Fever, unspecified  Bipolar 1 disorder (HCC)      ___________________________________________        Note: This dictation was prepared with Dragon dictation. Any transcriptional errors that result from this process are unintentional     Governor RooksLord, Yerick Eggebrecht, MD 04/05/17 1444

## 2017-04-05 NOTE — Progress Notes (Signed)
Patient arrived from the ED.  Order received from Dr Elpidio AnisSudini for a nicotine patch

## 2017-04-05 NOTE — ED Notes (Signed)
Admitting MD to DC BC x 2.

## 2017-04-05 NOTE — ED Notes (Signed)
Patient has a small open wound to her left hand.  No bleeding from area at this time.  Patient states, "I grabbed something too hard and it made a blister and then popped."

## 2017-04-05 NOTE — ED Notes (Signed)
Patient is complaining of feeling very cold and of bilateral flank pain.  Patient is shivering during assessment.  Patient's face appears pale and skin is hot.  [facility] was [facility] for a bilateral kidney infection.  Patient is tachypnic at this time.

## 2017-04-05 NOTE — ED Notes (Signed)
Pt [facility]. Pt is extremely difficult to stick and plan to speak with admitting MD regarding need for further cultures to be drawn.

## 2017-04-05 NOTE — H&P (Signed)
[employer] - [place] [facility]   PATIENT NAME: Janice DurandJennifer Walls    MR#:  [id number]  DATE OF BIRTH:  10/07/1985  DATE OF ADMISSION:  04/05/2017  PRIMARY CARE PHYSICIAN: Patient, No Pcp Per   REQUESTING/REFERRING PHYSICIAN: Dr. Shaune PollackLord  CHIEF COMPLAINT:   Chief Complaint  Patient presents with  . Flank Pain    HISTORY OF PRESENT ILLNESS:  Janice Walls  is a 31 y.o. female with a known history of anxiety, bipolar presents to the emergency room complaining of fever and bilateral flank pain.  Patient was seen on 04/04/2017 in [facility] emergency room and left AGAINST MEDICAL ADVICE after receiving IV fluids and 1 dose of IV ceftriaxone for pyelonephritis which showed up on CT scan.  Today she had fever of 102.5.  Patient is being admitted for treatment of her right-sided pyelonephritis.  Urine cultures and blood cultures drawn in the outside emergency room are still pending.  PAST MEDICAL HISTORY:   Past Medical History:  Diagnosis Date  . Anxiety   . Bipolar 1 disorder (HCC)   . Depression   . Insomnia   . Substance abuse (HCC)     PAST SURGICAL HISTORY:   Past Surgical History:  Procedure Laterality Date  . CESAREAN SECTION    . CESAREAN SECTION      SOCIAL HISTORY:   Social History   Tobacco Use  . Smoking status: Current Every Day Smoker    Packs/day: 1.00    Years: 18.00    Pack years: 18.00    Types: Cigarettes  . Smokeless tobacco: Never Used  Substance Use Topics  . Alcohol use: No    Frequency: Never    FAMILY HISTORY:   Family History  Problem Relation Age of Onset  . CAD Father 3146    DRUG ALLERGIES:  No Known Allergies  REVIEW OF SYSTEMS:   Review of Systems  Constitutional: Positive for malaise/fatigue. Negative for chills and fever.  HENT: Negative for sore throat.   Eyes: Negative for blurred vision, double vision and pain.  Respiratory: Negative for cough, hemoptysis, shortness of breath and wheezing.    Cardiovascular: Negative for chest pain, palpitations, orthopnea and leg swelling.  Gastrointestinal: Positive for abdominal pain and nausea. Negative for constipation, diarrhea, heartburn and vomiting.  Genitourinary: Positive for dysuria and frequency. Negative for hematuria.  Musculoskeletal: Negative for back pain and joint pain.  Skin: Negative for rash.  Neurological: Positive for weakness. Negative for sensory change, speech change, focal weakness and headaches.  Endo/Heme/Allergies: Does not bruise/bleed easily.  Psychiatric/Behavioral: Negative for depression. The patient is not nervous/anxious.     MEDICATIONS AT HOME:   Prior to Admission medications   Medication Sig Start Date End Date Taking? Authorizing Provider  ciprofloxacin (CIPRO) 500 MG tablet Take 1 tablet (500 mg total) by mouth 2 (two) times daily. 04/03/17  Yes Rolland PorterJames, Mark, MD  ondansetron (ZOFRAN ODT) 4 MG disintegrating tablet Take 1 tablet (4 mg total) by mouth every 8 (eight) hours as needed for nausea. 04/03/17  Yes Rolland PorterJames, Mark, MD  oxyCODONE-acetaminophen (PERCOCET/ROXICET) 5-325 MG tablet Take 2 tablets by mouth every 4 (four) hours as needed. 04/03/17  Yes Rolland PorterJames, Mark, MD  acetaminophen (TYLENOL) 325 MG tablet Take 650 mg by mouth every 6 (six) hours as needed for moderate pain.    [provider]  ARIPiprazole ER 400 MG SRER Inject 400 mg into the muscle every 30 (thirty) days. Due on June 14 Patient not taking: Reported on  04/05/2017 10/12/16   Jimmy FootmanHernandez-Gonzalez, Andrea, MD     VITAL SIGNS:  Blood pressure 115/70, pulse (!) 113, temperature (!) 102.5 F (39.2 C), temperature source Oral, resp. rate 16, last menstrual period 03/20/2017, SpO2 95 %.  PHYSICAL EXAMINATION:  Physical Exam  GENERAL:  31 y.o.-year-old patient lying in the bed with no acute distress.  EYES: Pupils equal, round, reactive to light and accommodation. No scleral icterus. Extraocular muscles intact.  HEENT: Head atraumatic,  normocephalic. Oropharynx and nasopharynx clear. No oropharyngeal erythema, moist oral mucosa  NECK:  Supple, no jugular venous distention. No thyroid enlargement, no tenderness.  LUNGS: Normal breath sounds bilaterally, no wheezing, rales, rhonchi. No use of accessory muscles of respiration.  CARDIOVASCULAR: S1, S2 normal. No murmurs, rubs, or gallops.  ABDOMEN: Soft.  Bowel sounds present.  Bilateral flank pain and CVA tenderness. EXTREMITIES: No pedal edema, cyanosis, or clubbing. + 2 pedal & radial pulses b/l.   NEUROLOGIC: Cranial nerves II through XII are intact. No focal Motor or sensory deficits appreciated b/l PSYCHIATRIC: The patient is alert and oriented x 3. Good affect.  SKIN: No obvious rash, lesion, or ulcer.   LABORATORY PANEL:   CBC Recent Labs  Lab 04/05/17 1035  WBC 11.8*  HGB 13.7  HCT 40.6  PLT 143*   ------------------------------------------------------------------------------------------------------------------  Chemistries  Recent Labs  Lab 04/05/17 1035  NA 139  K 3.7  CL 107  CO2 20*  GLUCOSE 80  BUN <5*  CREATININE 0.62  CALCIUM 8.5*   ------------------------------------------------------------------------------------------------------------------  Cardiac Enzymes No results for input(s): TROPONINI in the last 168 hours. ------------------------------------------------------------------------------------------------------------------  RADIOLOGY:  No results found.   IMPRESSION AND PLAN:   * Right pyelonephritis.  Cultures pending.  Will start IV ceftriaxone. Normal saline bolus 1 L now.  *Bipolar disorder.  Continue home medications.  *DVT prophylaxis with Lovenox  All the records are reviewed and case discussed with ED provider. Management plans discussed with the patient, family and they are in agreement.  CODE STATUS: FULL CODE  TOTAL TIME TAKING CARE OF THIS PATIENT: 40 minutes.   Molinda BailiffSrikar R Macarthur Lorusso M.D on 04/05/2017 at 3:16  PM  Between 7am to 6pm - Pager - [phone]  After 6pm go to [email or web address] - password [email or web address]  [facility] [place] [facility]  Office  [phone]  CC: Primary care physician; Patient, No Pcp Per  Note: This dictation was prepared with Dragon dictation along with smaller phrase technology. Any transcriptional errors that result from this process are unintentional.
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
## 2017-04-05 NOTE — Progress Notes (Signed)
Mother of patient called to get information on patient. Adv that patient does not want her information given to anybody including her mother. Explain HIPPA. Mother understood and explain patient is unstable and aggressive. Asked that we let the doctor know that daughter is not capable of making decisions for herself. Adv I would pass information along to day nurse.

## 2017-04-06 LAB — BASIC METABOLIC PANEL
ANION GAP: 6 (ref 5–15)
BUN: 5 mg/dL — AB (ref 6–20)
CO2: 23 mmol/L (ref 22–32)
Calcium: 8.3 mg/dL — ABNORMAL LOW (ref 8.9–10.3)
Chloride: 107 mmol/L (ref 101–111)
Creatinine, Ser: 0.64 mg/dL (ref 0.44–1.00)
GFR calc Af Amer: >60 mL/min (ref >60)
GFR calc non Af Amer: >60 mL/min (ref >60)
GLUCOSE: 97 mg/dL (ref 65–99)
POTASSIUM: 4 mmol/L (ref 3.5–5.1)
Sodium: 136 mmol/L (ref 135–145)

## 2017-04-06 LAB — CBC
HEMATOCRIT: 37.1 % (ref 35.0–47.0)
HEMOGLOBIN: 12.8 g/dL (ref 12.0–16.0)
MCH: 31.4 pg (ref 26.0–34.0)
MCHC: 34.4 g/dL (ref 32.0–36.0)
MCV: 91.3 fL (ref 80.0–100.0)
Platelets: 159 10*3/uL (ref 150–440)
RBC: 4.06 MIL/uL (ref 3.80–5.20)
RDW: 13.3 % (ref 11.5–14.5)
WBC: 8.3 10*3/uL (ref 3.6–11.0)

## 2017-04-06 LAB — URINE CULTURE
CULTURE: NO GROWTH
Culture: NO GROWTH

## 2017-04-06 MED ORDER — ZOLPIDEM TARTRATE 5 MG PO TABS
5.0000 mg | ORAL_TABLET | Freq: Once | ORAL | Status: AC
  Administered 2017-04-06: 5 mg via ORAL
  Filled 2017-04-06: qty 1

## 2017-04-06 MED ORDER — OXYCODONE-ACETAMINOPHEN 5-325 MG PO TABS
1.0000 | ORAL_TABLET | Freq: Four times a day (QID) | ORAL | Status: DC | PRN
  Administered 2017-04-06: 1 via ORAL
  Filled 2017-04-06: qty 1

## 2017-04-06 NOTE — Progress Notes (Signed)
04/06/2017  5:47 PM  Around 1700 was notified by RN Beth that pt was agitated, pt and significant other arguing, yelling loudly at each other and cursing in the room.  Called security to ask significant other to leave.  Pt insisted she was leaving against medical advice if significant other ws not allowed to stay.  I explained their yelling was disrupting the care of and frightening other patients and that he needed to leave.  Pt would not accept this and began getting dressed.  I notified attending physician Imogene BurnChen and got pt AMA form.  Pt filled out form and exited the hospital AMA.  Bradly Chrisougherty, Ladislao Cohenour E, RN

## 2017-04-06 NOTE — Progress Notes (Signed)
   [employer] - [place] [facility]   PATIENT NAME: Janice DurandJennifer Walls    MR#:  [id number]  DATE OF BIRTH:  03/31/1986  SUBJECTIVE:  CHIEF COMPLAINT:   Chief Complaint  Patient presents with  . Flank Pain   The patient feels better, no abdominal pain no flank pain. REVIEW OF SYSTEMS:  Review of Systems  Constitutional: Negative for chills, fever and malaise/fatigue.  HENT: Negative for sore throat.   Eyes: Negative for blurred vision and double vision.  Respiratory: Negative for cough, hemoptysis, shortness of breath, wheezing and stridor.   Cardiovascular: Negative for chest pain, palpitations, orthopnea and leg swelling.  Gastrointestinal: Negative for abdominal pain, blood in stool, diarrhea, melena, nausea and vomiting.  Genitourinary: Negative for dysuria, flank pain and hematuria.  Musculoskeletal: Negative for back pain and joint pain.  Neurological: Negative for dizziness, sensory change, focal weakness, seizures, loss of consciousness, weakness and headaches.  Endo/Heme/Allergies: Negative for polydipsia.  Psychiatric/Behavioral: Negative for depression. The patient is not nervous/anxious.     DRUG ALLERGIES:  No Known Allergies VITALS:  Blood pressure 117/70, pulse 73, temperature 98.3 F (36.8 C), temperature source Oral, resp. rate 16, height 5\' 2"  (1.575 m), weight 164 lb 3.2 oz (74.5 kg), last menstrual period 03/20/2017, SpO2 100 %. PHYSICAL EXAMINATION:  Physical Exam  Constitutional: She is oriented to person, place, and time and well-developed, well-nourished, and in no distress.  HENT:  Head: Normocephalic.  Mouth/Throat: Oropharynx is clear and moist.  Eyes: Conjunctivae and EOM are normal. Pupils are equal, round, and reactive to light. No scleral icterus.  Neck: Normal range of motion. Neck supple. No JVD present. No tracheal deviation present.  Cardiovascular: Normal rate, regular rhythm and normal heart sounds. Exam reveals no  gallop.  No murmur heard. Pulmonary/Chest: Effort normal and breath sounds normal. No respiratory distress. She has no wheezes. She has no rales.  Abdominal: Soft. Bowel sounds are normal. She exhibits no distension. There is no tenderness. There is no rebound.  Musculoskeletal: Normal range of motion. She exhibits no edema or tenderness.  Neurological: She is alert and oriented to person, place, and time. No cranial nerve deficit.  Skin: No rash noted. No erythema.  Psychiatric: Affect normal.   LABORATORY PANEL:  Female CBC Recent Labs  Lab 04/06/17 0528  WBC 8.3  HGB 12.8  HCT 37.1  PLT 159   ------------------------------------------------------------------------------------------------------------------ Chemistries  Recent Labs  Lab 04/06/17 0528  NA 136  K 4.0  CL 107  CO2 23  GLUCOSE 97  BUN 5*  CREATININE 0.64  CALCIUM 8.3*   RADIOLOGY:  No results found. ASSESSMENT AND PLAN:    * Right pyelonephritis.  Continue IV ceftriaxone.  Urine culture: No growth.  *Bipolar disorder.  Continue home medications.  Tobacco abuse.  Smoking cessation was counseled for 3-4 minutes.  All the records are reviewed and case discussed with Care Management/Social Worker. Management plans discussed with the patient, family and they are in agreement.  CODE STATUS: Full Code  TOTAL TIME TAKING CARE OF THIS PATIENT: 28 minutes.   More than 50% of the time was spent in counseling/coordination of care: YES  POSSIBLE D/C IN 1-2 DAYS, DEPENDING ON CLINICAL CONDITION.   Shaune PollackQing Macaila Tahir M.D on 04/06/2017 at 2:21 PM  Between 7am to 6pm - Pager - [phone]  After 6pm go to [email or web address] - Therapist, [email or web address] [email or web address]  [employer]  Hospitalists

## 2017-04-06 NOTE — Discharge Summary (Signed)
   [facility] [place] [facility]   PATIENT NAME: Janice DurandJennifer Walls    MR#:  [id number]  DATE OF BIRTH:  07/17/1985  DATE OF ADMISSION:  04/05/2017   ADMITTING PHYSICIAN: Milagros LollSrikar Sudini, MD  DATE OF DISCHARGE: 04/06/2017  5:00 PM  PRIMARY CARE PHYSICIAN: Patient, No Pcp Per   ADMISSION DIAGNOSIS:  Pyelonephritis [N12] Fever, unspecified [R50.9] Bipolar 1 disorder (HCC) [F31.9] DISCHARGE DIAGNOSIS:  Principal Problem:   Bipolar I disorder, most recent episode (or current) manic (HCC) Active Problems:   Pyelonephritis  SECONDARY DIAGNOSIS:   Past Medical History:  Diagnosis Date  . Anxiety   . Bipolar 1 disorder (HCC)   . Depression   . Insomnia   . Substance abuse [facility](HCC)    HOSPITAL COURSE:    *Right pyelonephritis. Continue IV ceftriaxone.  Urine culture: No growth.  *Bipolar disorder. Continue home medications.  Tobacco abuse.  Smoking cessation was counseled for 3-4 minutes.  Patient left AMA again. DISCHARGE MEDICATIONS:   Allergies as of 04/06/2017   No Known Allergies     Medication List    TAKE these medications   acetaminophen 325 MG tablet Commonly known as:  TYLENOL Take 650 mg by mouth every 6 (six) hours as needed for moderate pain.   ARIPiprazole ER 400 MG Srer Inject 400 mg into the muscle every 30 (thirty) days. Due on June 14   ciprofloxacin 500 MG tablet Commonly known as:  CIPRO Take 1 tablet (500 mg total) by mouth 2 (two) times daily.   ondansetron 4 MG disintegrating tablet Commonly known as:  ZOFRAN ODT Take 1 tablet (4 mg total) by mouth every 8 (eight) hours as needed for nausea.   oxyCODONE-acetaminophen 5-325 MG tablet Commonly known as:  PERCOCET/ROXICET Take 2 tablets by mouth every 4 (four) hours as needed.        Shaune PollackQing Leann Walls M.D on 04/06/2017 at 5:20 PM  Between 7am to 6pm - Pager - [phone]  After 6pm go to [email or web address] - Social research officer, governmentpassword [email or web address]  [facility] [place] [facility]    Office  [phone]  CC: Primary care physician; Patient, No Pcp Per   Note: This dictation was prepared with Dragon dictation along with smaller phrase technology. Any transcriptional errors that result from this process are unintentional.

## 2017-04-09 LAB — CULTURE, BLOOD (ROUTINE X 2)
CULTURE: NO GROWTH
Culture: NO GROWTH
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
## 2017-04-10 LAB — HIV ANTIBODY (ROUTINE TESTING W REFLEX)

## 2018-01-19 ENCOUNTER — Other Ambulatory Visit: Payer: Self-pay

## 2018-01-19 ENCOUNTER — Emergency Department
Admission: EM | Admit: 2018-01-19 | Discharge: 2018-01-19 | Discharge status: Against Medical Advice | Payer: Medicaid Other | Attending: Emergency Medicine | Admitting: Emergency Medicine

## 2018-01-19 DIAGNOSIS — Z79899 Other long term (current) drug therapy: Secondary | ICD-10-CM | POA: Diagnosis not present

## 2018-01-19 DIAGNOSIS — F329 Major depressive disorder, single episode, unspecified: Secondary | ICD-10-CM | POA: Diagnosis not present

## 2018-01-19 DIAGNOSIS — F319 Bipolar disorder, unspecified: Secondary | ICD-10-CM | POA: Diagnosis not present

## 2018-01-19 DIAGNOSIS — Z008 Encounter for other general examination: Secondary | ICD-10-CM

## 2018-01-19 DIAGNOSIS — Z046 Encounter for general psychiatric examination, requested by authority: Secondary | ICD-10-CM | POA: Insufficient documentation

## 2018-01-19 DIAGNOSIS — F17210 Nicotine dependence, cigarettes, uncomplicated: Secondary | ICD-10-CM | POA: Diagnosis not present
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
## 2018-01-19 NOTE — ED Notes (Signed)
Patient given crackers and water at this time.  

## 2018-01-19 NOTE — ED Notes (Signed)
Pt left prior to consultation or discharge paperwork; this RN unaware of pt's choice to leave  No peripheral IV placed this visit.
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
## 2018-01-19 NOTE — ED Notes (Signed)
Call for pt's telepsych monitor to be set up; pt not in 1H, charge RN reports pt seen walking towards discharge approx 2340; pt will be considered eloped att, Dr Darnelle CatalanMalinda notified

## 2018-01-19 NOTE — ED Notes (Addendum)
Pt to desk to request wanting "to go talk to my ride outside", pt oriented to leaving the ED is elopement and will have to start process over, pt irritable and tearful voicing regret b/c "I should have known better than to come down here with ya'll people"   Pt returned to hall bed, refreshments offered and declined

## 2018-01-20 NOTE — ED Notes (Signed)
Pt requests an psychiatric evaluation for court systems in [place] and [place], pt reports possibly needing a "shot in the butt" but unsure of the type of medicine, reports hx of

## 2018-03-12 ENCOUNTER — Emergency Department
Admission: EM | Admit: 2018-03-12 | Discharge: 2018-03-12 | Disposition: A | Discharge status: Home / Self Care | Payer: Medicaid Other | Attending: Emergency Medicine | Admitting: Emergency Medicine

## 2018-03-12 DIAGNOSIS — Z139 Encounter for screening, unspecified: Secondary | ICD-10-CM | POA: Diagnosis not present

## 2018-03-12 DIAGNOSIS — F17210 Nicotine dependence, cigarettes, uncomplicated: Secondary | ICD-10-CM | POA: Diagnosis not present

## 2018-03-12 DIAGNOSIS — F319 Bipolar disorder, unspecified: Secondary | ICD-10-CM | POA: Diagnosis present

## 2018-03-12 LAB — CBC
HEMATOCRIT: 45.3 % (ref 36.0–46.0)
HEMOGLOBIN: 14.8 g/dL (ref 12.0–15.0)
MCH: 30 pg (ref 26.0–34.0)
MCHC: 32.7 g/dL (ref 30.0–36.0)
MCV: 91.9 fL (ref 80.0–100.0)
NRBC: 0 % (ref 0.0–0.2)
Platelets: 234 10*3/uL (ref 150–400)
RBC: 4.93 MIL/uL (ref 3.87–5.11)
RDW: 13.2 % (ref 11.5–15.5)
WBC: 8.2 10*3/uL (ref 4.0–10.5)

## 2018-03-12 LAB — COMPREHENSIVE METABOLIC PANEL
ALT: 15 U/L (ref 0–44)
ANION GAP: 10 (ref 5–15)
AST: 23 U/L (ref 15–41)
Albumin: 4.1 g/dL (ref 3.5–5.0)
Alkaline Phosphatase: 59 U/L (ref 38–126)
BUN: <5 mg/dL — ABNORMAL LOW (ref 6–20)
CHLORIDE: 107 mmol/L (ref 98–111)
CO2: 23 mmol/L (ref 22–32)
Calcium: 8.9 mg/dL (ref 8.9–10.3)
Creatinine, Ser: 0.52 mg/dL (ref 0.44–1.00)
GFR calc non Af Amer: >60 mL/min (ref >60)
Glucose, Bld: 92 mg/dL (ref 70–99)
Potassium: 3.5 mmol/L (ref 3.5–5.1)
Sodium: 140 mmol/L (ref 135–145)
Total Bilirubin: 0.8 mg/dL (ref 0.3–1.2)
Total Protein: 7.1 g/dL (ref 6.5–8.1)

## 2018-03-12 LAB — ETHANOL: Alcohol, Ethyl (B): <10 mg/dL (ref <10)
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
## 2018-03-12 NOTE — ED Provider Notes (Signed)
[facility] Emergency Department Provider Note       Time seen: ----------------------------------------- 10:38 AM on 03/12/2018 -----------------------------------------   I have reviewed the triage vital signs and the nursing notes.  HISTORY   Chief Complaint Psychiatric Evaluation    HPI Janice Walls is a 32 y.o. female with a history of anxiety, bipolar disorder, depression, insomnia, substance abuse who presents to the ED for mental health evaluation.  Patient was doing court this morning at 9 AM.  Patient states she needs a mental health evaluation.  She does occasionally have auditory hallucinations, does not want to hurt herself or anyone else.  She reports having issues with her mother and stepfather.  She is not currently taking any medication for bipolar disorder.  Past Medical History:  Diagnosis Date  . Anxiety   . Bipolar 1 disorder (HCC)   . Depression   . Insomnia   . Substance abuse [facility])     Patient Active Problem List   Diagnosis Date Noted  . Pyelonephritis 04/05/2017  . Bipolar I disorder, most recent episode (or current) manic (HCC) 04/05/2017  . Sepsis due to gram-negative UTI (HCC) 04/04/2017  . Polysubstance abuse (HCC) 04/04/2017  . Hepatitis C 09/18/2016  . Transaminitis 09/18/2016  . Bipolar affective disorder, current episode manic (HCC) 09/12/2016  . Tobacco use disorder 09/12/2016  . Cannabis use disorder, severe, dependence (HCC) 09/12/2016    Past Surgical History:  Procedure Laterality Date  . CESAREAN SECTION    . CESAREAN SECTION      Allergies Patient has no known allergies.  Social History Social History   Tobacco Use  . Smoking status: Current Every Day Smoker    Packs/day: 1.00    Years: 18.00    Pack years: 18.00    Types: Cigarettes  . Smokeless tobacco: Never Used  Substance Use Topics  . Alcohol use: No    Frequency: Never  . Drug use: Yes    Types: Marijuana    Comment: last use  a couple of weeks ago; has used "them all", last used meth yesterday   Review of Systems Constitutional: Negative for fever. Cardiovascular: Negative for chest pain. Respiratory: Negative for shortness of breath. Gastrointestinal: Negative for abdominal pain, vomiting and diarrhea. Musculoskeletal: Negative for back pain. Skin: Negative for rash. Neurological: Negative for headaches, focal weakness or numbness. Psychiatric: Positive for hallucinations, negative for suicidal or homicidal thought  All systems negative/normal/unremarkable except as stated in the HPI  ____________________________________________   PHYSICAL EXAM:  VITAL SIGNS: ED Triage Vitals  Enc Vitals Group     BP 03/12/18 0948 108/82     Pulse Rate 03/12/18 0948 84     Resp 03/12/18 0948 16     Temp 03/12/18 0948 98.6 F (37 C)     Temp Source 03/12/18 0948 Oral     SpO2 03/12/18 0948 98 %     Weight 03/12/18 0950 156 lb (70.8 kg)     Height 03/12/18 0950 5\' 2"  (1.575 m)     Head Circumference --      Peak Flow --      Pain Score --      Pain Loc --      Pain Edu? --      Excl. in [place]? --    Constitutional: Alert and oriented. Well appearing and in no distress. Cardiovascular: Normal rate, regular rhythm. No murmurs, rubs, or gallops. Respiratory: Normal respiratory effort without tachypnea nor retractions. Breath sounds are clear and  equal bilaterally. No wheezes/rales/rhonchi. Gastrointestinal: Soft and nontender. Normal bowel sounds Musculoskeletal: Nontender with normal range of motion in extremities. No lower extremity tenderness nor edema. Neurologic:  Normal speech and language. No gross focal neurologic deficits are appreciated.  Skin:  Skin is warm, dry and intact. No rash noted. Psychiatric: Bizarre mood and affect at times ____________________________________________  ED COURSE:  As part of my medical decision making, I reviewed the following data within the electronic MEDICAL RECORD NUMBER  History obtained from family if available, nursing notes, old chart and ekg, as well as notes from prior ED visits. Patient presented for mental health evaluation, we will assess with labs as indicated at this time.  We will consult psychiatry.   Procedures ____________________________________________   LABS (pertinent positives/negatives)  Labs Reviewed  COMPREHENSIVE METABOLIC PANEL - Abnormal; Notable for the following components:      Result Value   BUN <5 (*)    All other components within normal limits  ETHANOL  CBC  URINE DRUG SCREEN, QUALITATIVE ([facility] ONLY)  POC URINE PREG, ED   ____________________________________________  DIFFERENTIAL DIAGNOSIS   Bipolar disorder, malingering, substance abuse, auditory hallucinations  FINAL ASSESSMENT AND PLAN  Bipolar disorder, medical screening exam   Plan: The patient had presented for mental health evaluation. Patient's labs did not reveal any acute process.  Patient became frustrated that it was taking so long and is leaving AGAINST MEDICAL ADVICE.  I will not give her a note clearing her for court.   Ulice DashJohnathan E Evalee Gerard, MD   Note: This note was generated in part or whole with voice recognition software. Voice recognition is usually quite accurate but there are transcription errors that can and very often do occur. I apologize for any typographical errors that were not detected and corrected.     Emily FilbertWilliams, Sherica Paternostro E, MD 03/12/18 1236

## 2018-03-12 NOTE — ED Notes (Signed)
Lab called to assist with lab draw. Stated they will come as soon as can get someone here.

## 2018-03-12 NOTE — ED Notes (Signed)
Pts friend, Barbara CowerJason, kept pts purse with cell phone and wallet. Clothes in patient belonging bag.

## 2018-03-12 NOTE — ED Notes (Addendum)
Pt states she came here for a mental evaluation for court. Would not elaborate why she has court stating she is very stressed out with stuff out of here. Pt talks a lot, disorganized thought process. Denies SI/HI/AVH. Pt laughing inappropriately. Pt reports she is not on any medications. Advised awaiting psychiatric consult pt at first okay with it, then a few minutes later pt stated "I'm not going to wait three hours, I can leave and go to [facility] cause I brought myself here". When staff attempted to educate her, pt argumentative then then settled down a few minutes later and started laughing again with staff.

## 2018-03-12 NOTE — ED Notes (Signed)
After speaking with Dr. Mayford KnifeWilliams pt left ama because she did not want to stay. Pt advised that she just needed a note saying that she was at the hospital because she missed court. Pt changed her clothes and walked out.

## 2018-03-12 NOTE — ED Triage Notes (Signed)
States she needs a mental evaluation.

## 2018-03-12 NOTE — ED Notes (Signed)
Pt left AMA stating she is not going to wait any more. Spoke with provider but refused to go with provider recommendation and walked out of ED.

## 2018-03-19 ENCOUNTER — Other Ambulatory Visit: Payer: Self-pay

## 2018-03-19 ENCOUNTER — Emergency Department
Admission: EM | Admit: 2018-03-19 | Discharge: 2018-03-19 | Disposition: A | Discharge status: Home / Self Care | Payer: Medicaid Other | Attending: Student in an Organized Health Care Education/Training Program | Admitting: Student in an Organized Health Care Education/Training Program

## 2018-03-19 ENCOUNTER — Encounter: Payer: Self-pay | Admitting: Emergency Medicine

## 2018-03-19 DIAGNOSIS — F17210 Nicotine dependence, cigarettes, uncomplicated: Secondary | ICD-10-CM | POA: Diagnosis not present

## 2018-03-19 DIAGNOSIS — Z008 Encounter for other general examination: Secondary | ICD-10-CM | POA: Insufficient documentation

## 2018-03-19 DIAGNOSIS — Z79899 Other long term (current) drug therapy: Secondary | ICD-10-CM | POA: Diagnosis not present

## 2018-03-19 DIAGNOSIS — F1910 Other psychoactive substance abuse, uncomplicated: Secondary | ICD-10-CM | POA: Diagnosis not present

## 2018-03-19 DIAGNOSIS — F419 Anxiety disorder, unspecified: Secondary | ICD-10-CM | POA: Diagnosis present

## 2018-03-19 DIAGNOSIS — F99 Mental disorder, not otherwise specified: Secondary | ICD-10-CM | POA: Insufficient documentation

## 2018-03-19 DIAGNOSIS — F329 Major depressive disorder, single episode, unspecified: Secondary | ICD-10-CM | POA: Diagnosis not present

## 2018-03-19 LAB — COMPREHENSIVE METABOLIC PANEL
ALT: 15 U/L (ref 0–44)
AST: 24 U/L (ref 15–41)
Albumin: 3.9 g/dL (ref 3.5–5.0)
Alkaline Phosphatase: 59 U/L (ref 38–126)
Anion gap: 8 (ref 5–15)
BUN: 9 mg/dL (ref 6–20)
CALCIUM: 8.9 mg/dL (ref 8.9–10.3)
CHLORIDE: 108 mmol/L (ref 98–111)
CO2: 23 mmol/L (ref 22–32)
Creatinine, Ser: 0.54 mg/dL (ref 0.44–1.00)
GFR calc Af Amer: >60 mL/min (ref >60)
Glucose, Bld: 108 mg/dL — ABNORMAL HIGH (ref 70–99)
Potassium: 3.5 mmol/L (ref 3.5–5.1)
Sodium: 139 mmol/L (ref 135–145)
TOTAL PROTEIN: 6.8 g/dL (ref 6.5–8.1)
Total Bilirubin: 0.6 mg/dL (ref 0.3–1.2)

## 2018-03-19 LAB — CBC
HCT: 46.1 % — ABNORMAL HIGH (ref 36.0–46.0)
Hemoglobin: 15.2 g/dL — ABNORMAL HIGH (ref 12.0–15.0)
MCH: 30.6 pg (ref 26.0–34.0)
MCHC: 33 g/dL (ref 30.0–36.0)
MCV: 92.8 fL (ref 80.0–100.0)
NRBC: 0 % (ref 0.0–0.2)
PLATELETS: 194 10*3/uL (ref 150–400)
RBC: 4.97 MIL/uL (ref 3.87–5.11)
RDW: 13.2 % (ref 11.5–15.5)
WBC: 8 10*3/uL (ref 4.0–10.5)

## 2018-03-19 LAB — URINE DRUG SCREEN, QUALITATIVE (ARMC ONLY)
AMPHETAMINES, UR SCREEN: NOT DETECTED
BENZODIAZEPINE, UR SCRN: NOT DETECTED
Barbiturates, Ur Screen: NOT DETECTED
Cannabinoid 50 Ng, Ur ~~LOC~~: POSITIVE — AB
Cocaine Metabolite,Ur ~~LOC~~: NOT DETECTED
MDMA (ECSTASY) UR SCREEN: NOT DETECTED
METHADONE SCREEN, URINE: NOT DETECTED
Opiate, Ur Screen: NOT DETECTED
PHENCYCLIDINE (PCP) UR S: NOT DETECTED
TRICYCLIC, UR SCREEN: NOT DETECTED

## 2018-03-19 LAB — POCT PREGNANCY, URINE: PREG TEST UR: NEGATIVE

## 2018-03-19 LAB — ETHANOL

## 2018-03-19 NOTE — ED Triage Notes (Addendum)
PT to ED states she needs a behav med eval that is court ordered. States her court date is tomorrow. PT denies SI/HI. Pt denies ETOH or drug use. NAD noted . Psych hx noted. Pt calm and cooperative in triage

## 2018-03-19 NOTE — ED Provider Notes (Signed)
[facility] Emergency Department Provider Note    First MD Initiated Contact with Patient 03/19/18 1700     (approximate)  I have reviewed the triage vital signs and the nursing notes.   HISTORY  Chief Complaint No chief complaint on file.    HPI Janice Walls is a 32 y.o. female history of bipolar as well as anxiety depression insomnia and substance abuse presents the ER for "mental health evaluation for court ."  Patient does not have any documents to describe what she needs.  States that her "lawyer told her she should get one before her court appearance tomorrow.  States that she has known about this for several weeks.  Did not schedule appointment with psychiatry.  Denies any medication changes.  Denies any SI or HI.  No hallucinations, irritability, sleep changes, anhedonia, appetite changes, mood changes.   Past Medical History:  Diagnosis Date  . Anxiety   . Bipolar 1 disorder (HCC)   . Depression   . Insomnia   . Substance abuse (HCC)    Family History  Problem Relation Age of Onset  . CAD Father 62   Past Surgical History:  Procedure Laterality Date  . CESAREAN SECTION    . CESAREAN SECTION     Patient Active Problem List   Diagnosis Date Noted  . Pyelonephritis 04/05/2017  . Bipolar I disorder, most recent episode (or current) manic (HCC) 04/05/2017  . Sepsis due to gram-negative UTI (HCC) 04/04/2017  . Polysubstance abuse (HCC) 04/04/2017  . Hepatitis C 09/18/2016  . Transaminitis 09/18/2016  . Bipolar affective disorder, current episode manic (HCC) 09/12/2016  . Tobacco use disorder 09/12/2016  . Cannabis use disorder, severe, dependence (HCC) 09/12/2016      Prior to Admission medications   Medication Sig Start Date End Date Taking? Authorizing Provider  acetaminophen (TYLENOL) 325 MG tablet Take 650 mg by mouth every 6 (six) hours as needed for moderate pain.    [provider]  ARIPiprazole ER 400 MG SRER  Inject 400 mg into the muscle every 30 (thirty) days. Due on June 14 Patient not taking: Reported on 04/05/2017 10/12/16   Jimmy Footman, MD  ciprofloxacin (CIPRO) 500 MG tablet Take 1 tablet (500 mg total) by mouth 2 (two) times daily. 04/03/17   Rolland Porter, MD  ondansetron (ZOFRAN ODT) 4 MG disintegrating tablet Take 1 tablet (4 mg total) by mouth every 8 (eight) hours as needed for nausea. 04/03/17   Rolland Porter, MD  oxyCODONE-acetaminophen (PERCOCET/ROXICET) 5-325 MG tablet Take 2 tablets by mouth every 4 (four) hours as needed. 04/03/17   Rolland Porter, MD    Allergies Patient has no known allergies.    Social History Social History   Tobacco Use  . Smoking status: Current Every Day Smoker    Packs/day: 1.00    Years: 18.00    Pack years: 18.00    Types: Cigarettes  . Smokeless tobacco: Never Used  Substance Use Topics  . Alcohol use: No    Frequency: Never  . Drug use: Yes    Types: Marijuana    Comment: last use a couple of weeks ago; has used "them all", last used meth yesterday    Review of Systems Patient denies headaches, rhinorrhea, blurry vision, numbness, shortness of breath, chest pain, edema, cough, abdominal pain, nausea, vomiting, diarrhea, dysuria, fevers, rashes or hallucinations unless otherwise stated above in HPI. ____________________________________________   PHYSICAL EXAM:  VITAL SIGNS: Vitals:   03/19/18 1610  BP:  118/64  Pulse: 82  Resp: 16  Temp: 99.1 F (37.3 C)  SpO2: 99%    Constitutional: Alert and oriented.  Eyes: Conjunctivae are normal.  Head: Atraumatic. Nose: No congestion/rhinnorhea. Mouth/Throat: Mucous membranes are moist.   Neck: No stridor. Painless ROM.  Cardiovascular: Normal rate, regular rhythm. Grossly normal heart sounds.  Good peripheral circulation. Respiratory: Normal respiratory effort.  No retractions. Lungs CTAB. Gastrointestinal: Soft and nontender. No distention. No abdominal bruits. No CVA  tenderness. Genitourinary:  Musculoskeletal: No lower extremity tenderness nor edema.  No joint effusions. Neurologic:  Normal speech and language. No gross focal neurologic deficits are appreciated. No facial droop Skin:  Skin is warm, dry and intact. No rash noted. Psychiatric: Mood and affect are appropriate. Speech and behavior are normal.  ____________________________________________   LABS (all labs ordered are listed, but only abnormal results are displayed)  Results for orders placed or performed during the hospital encounter of 03/19/18 (from the past 24 hour(s))  Comprehensive metabolic panel     Status: Abnormal   Collection Time: 03/19/18  4:15 PM  Result Value Ref Range   Sodium 139 135 - 145 mmol/L   Potassium 3.5 3.5 - 5.1 mmol/L   Chloride 108 98 - 111 mmol/L   CO2 23 22 - 32 mmol/L   Glucose, Bld 108 (H) 70 - 99 mg/dL   BUN 9 6 - 20 mg/dL   Creatinine, Ser 1.61 0.44 - 1.00 mg/dL   Calcium 8.9 8.9 - 09.6 mg/dL   Total Protein 6.8 6.5 - 8.1 g/dL   Albumin 3.9 3.5 - 5.0 g/dL   AST 24 15 - 41 U/L   ALT 15 0 - 44 U/L   Alkaline Phosphatase 59 38 - 126 U/L   Total Bilirubin 0.6 0.3 - 1.2 mg/dL   GFR calc non Af Amer >60 >60 mL/min   GFR calc Af Amer >60 >60 mL/min   Anion gap 8 5 - 15  Ethanol     Status: None   Collection Time: 03/19/18  4:15 PM  Result Value Ref Range   Alcohol, Ethyl (B) <10 <10 mg/dL  cbc     Status: Abnormal   Collection Time: 03/19/18  4:15 PM  Result Value Ref Range   WBC 8.0 4.0 - 10.5 K/uL   RBC 4.97 3.87 - 5.11 MIL/uL   Hemoglobin 15.2 (H) 12.0 - 15.0 g/dL   HCT 04.5 (H) 40.9 - 81.1 %   MCV 92.8 80.0 - 100.0 fL   MCH 30.6 26.0 - 34.0 pg   MCHC 33.0 30.0 - 36.0 g/dL   RDW 91.4 78.2 - 95.6 %   Platelets 194 150 - 400 K/uL   nRBC 0.0 0.0 - 0.2 %  Urine Drug Screen, Qualitative     Status: Abnormal   Collection Time: 03/19/18  4:15 PM  Result Value Ref Range   Tricyclic, Ur Screen NONE DETECTED NONE DETECTED   Amphetamines, Ur  Screen NONE DETECTED NONE DETECTED   MDMA (Ecstasy)Ur Screen NONE DETECTED NONE DETECTED   Cocaine Metabolite,Ur Dendron NONE DETECTED NONE DETECTED   Opiate, Ur Screen NONE DETECTED NONE DETECTED   Phencyclidine (PCP) Ur S NONE DETECTED NONE DETECTED   Cannabinoid 50 Ng, Ur Hyrum POSITIVE (A) NONE DETECTED   Barbiturates, Ur Screen NONE DETECTED NONE DETECTED   Benzodiazepine, Ur Scrn NONE DETECTED NONE DETECTED   Methadone Scn, Ur NONE DETECTED NONE DETECTED  Pregnancy, urine POC     Status: None   Collection Time: 03/19/18  4:34 PM  Result Value Ref Range   Preg Test, Ur NEGATIVE NEGATIVE   ____________________________________________  ____________________________________________  RADIOLOGY   ____________________________________________   PROCEDURES  Procedure(s) performed:  Procedures    Critical Care performed: no ____________________________________________   INITIAL IMPRESSION / ASSESSMENT AND PLAN / ED COURSE  Pertinent labs & imaging results that were available during my care of the patient were reviewed by me and considered in my medical decision making (see chart for details).   DDX: well check, Psychosis, delirium, medication effect, noncompliance, polysubstance abuse, Si, Hi, depression   Janice Walls is a 32 y.o. who presents to the ED with request for "mental health evaluation ".  Stable, well-kempt and in no acute distress.  Blood work and urinalysis does test positive for cannabinoids.  No irritability.  No SI or HI.  No hallucinations.  No indication for IVC.   Clinical Course as of Mar 19 1713  Tue Mar 19, 2018  1711 I discussed this case with Dr. Toni Amendlapacs.  This is not typically something done on emergent basis and patient will need outpatient follow-up.  Patient stable for discharge home.   [PR]    Clinical Course User Index [PR] Willy Eddyobinson, Iley Deignan, MD     As part of my medical decision making, I reviewed the following data within the electronic  MEDICAL RECORD NUMBER Nursing notes reviewed and incorporated, Labs reviewed, notes from prior ED visits.   ____________________________________________   FINAL CLINICAL IMPRESSION(S) / ED DIAGNOSES  Final diagnoses:  Mental health disorder      NEW MEDICATIONS STARTED DURING THIS VISIT:  New Prescriptions   No medications on file     Note:  This document was prepared using Dragon voice recognition software and may include unintentional dictation errors.    Willy Eddyobinson, Olamide Lahaie, MD 03/19/18 [phone]

## 2018-03-19 NOTE — ED Notes (Signed)
Pt belongings bag consist of tshirt, underwear, black pants, boots, socks  Bracelets, and 1 ring noted.

## 2018-03-19 NOTE — ED Notes (Signed)

## 2018-03-19 NOTE — Discharge Instructions (Addendum)
Patient was evaluated in the emergency department by board-certified emergency physician.  She has been deemed clinically stable for outpatient evaluation

## 2018-12-07 ENCOUNTER — Encounter: Payer: Self-pay | Admitting: Emergency Medicine

## 2018-12-07 ENCOUNTER — Emergency Department
Admission: EM | Admit: 2018-12-07 | Discharge: 2018-12-08 | Disposition: A | Discharge status: Home / Self Care | Payer: Medicaid Other | Attending: Emergency Medicine | Admitting: Emergency Medicine

## 2018-12-07 ENCOUNTER — Other Ambulatory Visit: Payer: Self-pay

## 2018-12-07 DIAGNOSIS — F17210 Nicotine dependence, cigarettes, uncomplicated: Secondary | ICD-10-CM | POA: Insufficient documentation

## 2018-12-07 DIAGNOSIS — F1510 Other stimulant abuse, uncomplicated: Secondary | ICD-10-CM | POA: Insufficient documentation

## 2018-12-07 DIAGNOSIS — R440 Auditory hallucinations: Secondary | ICD-10-CM | POA: Diagnosis present

## 2018-12-07 DIAGNOSIS — F319 Bipolar disorder, unspecified: Secondary | ICD-10-CM | POA: Insufficient documentation

## 2018-12-07 DIAGNOSIS — F3131 Bipolar disorder, current episode depressed, mild: Secondary | ICD-10-CM

## 2018-12-07 DIAGNOSIS — F1910 Other psychoactive substance abuse, uncomplicated: Secondary | ICD-10-CM

## 2018-12-07 LAB — URINE DRUG SCREEN, QUALITATIVE (ARMC ONLY)
Amphetamines, Ur Screen: POSITIVE — AB
Barbiturates, Ur Screen: NOT DETECTED
Benzodiazepine, Ur Scrn: NOT DETECTED
Cannabinoid 50 Ng, Ur ~~LOC~~: POSITIVE — AB
Cocaine Metabolite,Ur ~~LOC~~: NOT DETECTED
MDMA (Ecstasy)Ur Screen: NOT DETECTED
Methadone Scn, Ur: NOT DETECTED
Opiate, Ur Screen: NOT DETECTED
Phencyclidine (PCP) Ur S: NOT DETECTED
Tricyclic, Ur Screen: NOT DETECTED

## 2018-12-07 LAB — CBC
HCT: 47.8 % — ABNORMAL HIGH (ref 36.0–46.0)
Hemoglobin: 15.8 g/dL — ABNORMAL HIGH (ref 12.0–15.0)
MCH: 30.2 pg (ref 26.0–34.0)
MCHC: 33.1 g/dL (ref 30.0–36.0)
MCV: 91.2 fL (ref 80.0–100.0)
Platelets: 267 10*3/uL (ref 150–400)
RBC: 5.24 MIL/uL — ABNORMAL HIGH (ref 3.87–5.11)
RDW: 13.3 % (ref 11.5–15.5)
WBC: 10.4 10*3/uL (ref 4.0–10.5)
nRBC: 0 % (ref 0.0–0.2)

## 2018-12-07 LAB — COMPREHENSIVE METABOLIC PANEL
ALT: 15 U/L (ref 0–44)
AST: 21 U/L (ref 15–41)
Albumin: 4.9 g/dL (ref 3.5–5.0)
Alkaline Phosphatase: 64 U/L (ref 38–126)
Anion gap: 11 (ref 5–15)
BUN: 15 mg/dL (ref 6–20)
CO2: 23 mmol/L (ref 22–32)
Calcium: 10.1 mg/dL (ref 8.9–10.3)
Chloride: 106 mmol/L (ref 98–111)
Creatinine, Ser: 0.86 mg/dL (ref 0.44–1.00)
GFR calc Af Amer: >60 mL/min (ref >60)
GFR calc non Af Amer: >60 mL/min (ref >60)
Glucose, Bld: 97 mg/dL (ref 70–99)
Potassium: 3.5 mmol/L (ref 3.5–5.1)
Sodium: 140 mmol/L (ref 135–145)
Total Bilirubin: 0.9 mg/dL (ref 0.3–1.2)
Total Protein: 8.6 g/dL — ABNORMAL HIGH (ref 6.5–8.1)

## 2018-12-07 LAB — ETHANOL: Alcohol, Ethyl (B): <10 mg/dL (ref <10)

## 2018-12-07 LAB — POCT PREGNANCY, URINE: Preg Test, Ur: NEGATIVE

## 2018-12-07 MED ORDER — ACETAMINOPHEN 325 MG PO TABS
650.0000 mg | ORAL_TABLET | Freq: Once | ORAL | Status: AC
  Administered 2018-12-07: 22:00:00 650 mg via ORAL
  Filled 2018-12-07: qty 2

## 2018-12-07 NOTE — ED Notes (Signed)
Pt. Requested and was given meal tray and drink. 

## 2018-12-07 NOTE — ED Notes (Signed)
TTS video machine brought into patients room.  TTS ready, pt. Ready.

## 2018-12-07 NOTE — ED Notes (Signed)
Phone report given to [place], [place]

## 2018-12-07 NOTE — ED Triage Notes (Addendum)
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

## 2018-12-07 NOTE — ED Notes (Signed)
First Nurse Note: Pt to ED via [facility], pt has hx/o Bi-polar, pt states that she smokes meth, last smoked meth sometime within the last 24 hours. Pt states that she is hearing voices, having HA, ear ache, and hot flashes. Pt is in NAD at this time

## 2018-12-07 NOTE — ED Notes (Signed)
Pt. States "I feel like I am having nervous breakdown"  Pt. Endorses hearing voices, pt. Does not know what they are saying.  Pt. Then says "that little bitch". Pt. Asked "who are you referring too"  Pt. States, "boyfriends new girlfriend".  Pt. States using meth recently.  Pt. States using meth only two times this year.  Pt. Denies SI/HI

## 2018-12-07 NOTE — ED Notes (Signed)
Called for pt for triage; officer on duty says pt went out front to call her daughter; will call again shortly;

## 2018-12-07 NOTE — ED Provider Notes (Signed)
[facility] Emergency Department Provider Note  Time seen: 8:06 PM  I have reviewed the triage vital signs and the nursing notes.   HISTORY  Chief Complaint Depression and Headache   HPI Janice Walls is a 33 y.o. female with a past medical history of anxiety, bipolar, substance abuse presents to the emergency department for hearing voices.  Patient states for the past several days she has been hearing voices, patient does admit to methamphetamine use.  Patient states the voices are in her head which is giving her headache.  Denies any fever cough or shortness of breath.  Denies any other significant medical complaints.   Past Medical History:  Diagnosis Date  . Anxiety   . Bipolar 1 disorder (HCC)   . Depression   . Insomnia   . Substance abuse [facility](HCC)     Patient Active Problem List   Diagnosis Date Noted  . Pyelonephritis 04/05/2017  . Bipolar I disorder, most recent episode (or current) manic (HCC) 04/05/2017  . Sepsis due to gram-negative UTI (HCC) 04/04/2017  . Polysubstance abuse (HCC) 04/04/2017  . Hepatitis C 09/18/2016  . Transaminitis 09/18/2016  . Bipolar affective disorder, current episode manic (HCC) 09/12/2016  . Tobacco use disorder 09/12/2016  . Cannabis use disorder, severe, dependence (HCC) 09/12/2016    Past Surgical History:  Procedure Laterality Date  . CESAREAN SECTION    . CESAREAN SECTION      Prior to Admission medications   Medication Sig Start Date End Date Taking? Authorizing Provider  acetaminophen (TYLENOL) 325 MG tablet Take 650 mg by mouth every 6 (six) hours as needed for moderate pain.    [provider]  ARIPiprazole ER 400 MG SRER Inject 400 mg into the muscle every 30 (thirty) days. Due on June 14 Patient not taking: Reported on 04/05/2017 10/12/16   Jimmy FootmanHernandez-Gonzalez, Andrea, MD  ciprofloxacin (CIPRO) 500 MG tablet Take 1 tablet (500 mg total) by mouth 2 (two) times daily. 04/03/17   Rolland PorterJames, Mark, MD   ondansetron (ZOFRAN ODT) 4 MG disintegrating tablet Take 1 tablet (4 mg total) by mouth every 8 (eight) hours as needed for nausea. 04/03/17   Rolland PorterJames, Mark, MD  oxyCODONE-acetaminophen (PERCOCET/ROXICET) 5-325 MG tablet Take 2 tablets by mouth every 4 (four) hours as needed. 04/03/17   Rolland PorterJames, Mark, MD    No Known Allergies  Family History  Problem Relation Age of Onset  . CAD Father 8246    Social History Social History   Tobacco Use  . Smoking status: Current Every Day Smoker    Packs/day: 1.00    Years: 18.00    Pack years: 18.00    Types: Cigarettes  . Smokeless tobacco: Never Used  Substance Use Topics  . Alcohol use: Yes    Frequency: Never  . Drug use: Yes    Types: Marijuana, Methamphetamines    Comment: meth today; marijuan unsure    Review of Systems Constitutional: Negative for fever. Cardiovascular: Negative for chest pain. Respiratory: Negative for shortness of breath. Gastrointestinal: Negative for abdominal pain Musculoskeletal: Negative for musculoskeletal complaints Skin: Negative for skin complaints  Neurological: Negative for headache All other ROS negative  ____________________________________________   PHYSICAL EXAM:  VITAL SIGNS: ED Triage Vitals  Enc Vitals Group     BP 12/07/18 1913 132/89     Pulse Rate 12/07/18 1913 82     Resp 12/07/18 1913 20     Temp 12/07/18 1913 100.3 F (37.9 C)     Temp Source  12/07/18 1913 Oral     SpO2 12/07/18 1913 98 %     Weight 12/07/18 1914 160 lb (72.6 kg)     Height 12/07/18 1914 5\' 2"  (1.575 m)     Head Circumference --      Peak Flow --      Pain Score 12/07/18 1919 2     Pain Loc --      Pain Edu? --      Excl. in [place]? --    Constitutional: Alert and oriented. Well appearing and in no distress. Eyes: Normal exam ENT      Head: Normocephalic and atraumatic.      Mouth/Throat: Mucous membranes are moist. Cardiovascular: Normal rate, regular rhythm.  Respiratory: Normal respiratory effort  without tachypnea nor retractions. Breath sounds are clear Gastrointestinal: Soft and nontender. No distention.  Musculoskeletal: Nontender with normal range of motion in all extremities.  Neurologic:  Normal speech and language. No gross focal neurologic deficits  Skin:  Skin is warm, dry and intact.  Psychiatric: States she is hearing voices.  Denies SI or HI.  ____________________________________________  INITIAL IMPRESSION / ASSESSMENT AND PLAN / ED COURSE  Pertinent labs & imaging results that were available during my care of the patient were reviewed by me and considered in my medical decision making (see chart for details).   Patient presents emergency department for psychosis, states she is hearing voices.  Does admit to recent methamphetamine use.  Differential would include substance-induced psychosis, schizophrenia.  We will check labs and continue to closely monitor the patient.  Given the patient's psychosis hearing voices, did make a statement to me that sometimes she which she would just go to sleep, will place the patient under IVC until psychiatry can evaluate.  Janice Walls was evaluated in Emergency Department on 12/07/2018 for the symptoms described in the history of present illness. She was evaluated in the context of the global COVID-19 pandemic, which necessitated consideration that the patient might be at risk for infection with the SARS-CoV-2 virus that causes COVID-19. Institutional protocols and algorithms that pertain to the evaluation of patients at risk for COVID-19 are in a state of rapid change based on information released by regulatory bodies including the CDC and federal and state organizations. These policies and algorithms were followed during the patient's care in the ED.  ____________________________________________   FINAL CLINICAL IMPRESSION(S) / ED DIAGNOSES  Psychosis   Harvest Dark, MD 12/08/18 2205

## 2018-12-07 NOTE — BH Assessment (Signed)
Assessment Note  Janice Walls is an 33 y.o. female. Ms. Carapia arrived to the ED by way of EMS. She reports, "I was having a bad headache".  She denied symptoms of depression or anxiety. She denied having auditory or visual hallucinations.  She denied suicidal ideation or intent. She denied homicidal ideation or intent.  She reports using meth, alcohol, and marijuana. She reports facing stress from financial situations, relationships, and work.    While conducting the assessment, Ms. Branscum appeared to be having active hallucinations.  Ms. Mcclees, asked the assessor, "Why would you tell me I'm going to jail if you don't answer my questions".  No statement was made to Ms. Goin about going to jail or encouraging to answer questions, as she was a willing participant.  Ms. Ebey further carried on conversations with others that were not in the room.  She became upset, arguing about the meeting being recorded and stated that the TTS assessor told her that the session had to be recorded. The meeting was not being recorded, nor did the assessor discuss recording the assessment.  Diagnosis: Substance misuse, auditory hallucinations  Past Medical History:  Past Medical History:  Diagnosis Date  . Anxiety   . Bipolar 1 disorder ([place])   . Depression   . Insomnia   . Substance abuse [facility])     Past Surgical History:  Procedure Laterality Date  . CESAREAN SECTION    . CESAREAN SECTION      Family History:  Family History  Problem Relation Age of Onset  . CAD Father 87    Social History:  reports that she has been smoking cigarettes. She has a 18.00 pack-year smoking history. She has never used smokeless tobacco. She reports current alcohol use. She reports current drug use. Drugs: Marijuana and Methamphetamines.  Additional Social History:  Alcohol / Drug Use History of alcohol / drug use?: Yes Substance #1 Name of Substance 1: Methamphetamines 1 - Age of First Use: 23 1 - Amount  (size/oz): Unsure 1 - Frequency: Not very often 1 - Last Use / Amount: 12/07/2018 Substance #2 Name of Substance 2: Alcohol 2 - Age of First Use: 12 2 - Amount (size/oz): 1 beer 2 - Frequency: once a month 2 - Last Use / Amount: 12/07/2018 Substance #3 Name of Substance 3: Marijuana 3 - Age of First Use: 11 3 - Amount (size/oz): Unsure 3 - Frequency: once a month 3 - Last Use / Amount: Unsure  CIWA: CIWA-Ar BP: 132/89 Pulse Rate: 82 COWS:    Allergies: No Known Allergies  Home Medications: (Not in a hospital admission)   OB/GYN Status:  Patient's last menstrual period was 12/02/2018 (approximate).  General Assessment Data Location of Assessment: [facility] ED TTS Assessment: In system Is this a Tele or Face-to-Face Assessment?: Face-to-Face Is this an Initial Assessment or a Re-assessment for this encounter?: Initial Assessment Patient Accompanied by:: N/A Language Other than English: No Living Arrangements: Other (Comment)(Private residence) What gender do you identify as?: Female Marital status: Single Pregnancy Status: No Living Arrangements: Alone Can pt return to current living arrangement?: Yes Admission Status: Voluntary Is patient capable of signing voluntary admission?: Yes Referral Source: Self/Family/Friend Insurance type: Medicaid  Medical Screening Exam ([place]) Medical Exam completed: Yes  Crisis Care Plan Living Arrangements: Alone Legal Guardian: Other:(Self) Name of Psychiatrist: [place] name Name of Therapist: [facility]  Education Status Is patient currently in school?: No Is the patient employed, unemployed or receiving disability?:  Receiving disability income  Risk to self with the past 6 months Suicidal Ideation: No Has patient been a risk to self within the past 6 months prior to admission? : No Suicidal Intent: No Has patient had any suicidal intent within the past 6 months prior to admission? : No Is patient at risk  for suicide?: No Suicidal Plan?: No Has patient had any suicidal plan within the past 6 months prior to admission? : No Access to Means: No What has been your use of drugs/alcohol within the last 12 months?: use of meth, alcohol, and marijuana Previous Attempts/Gestures: Yes Other Self Harm Risks: denied Triggers for Past Attempts: Unknown Intentional Self Injurious Behavior: None Family Suicide History: No Recent stressful life event(s): Financial Problems Persecutory voices/beliefs?: Yes Depression: No Depression Symptoms: (denied by patient) Substance abuse history and/or treatment for substance abuse?: No Suicide prevention information given to non-admitted patients: Not applicable  Risk to Others within the past 6 months Homicidal Ideation: No Does patient have any lifetime risk of violence toward others beyond the six months prior to admission? : No Thoughts of Harm to Others: No Current Homicidal Intent: No Current Homicidal Plan: No Access to Homicidal Means: No Identified Victim: None identified History of harm to others?: No Assessment of Violence: None Noted Does patient have access to weapons?: No Criminal Charges Pending?: Yes Describe Pending Criminal Charges: DUI, 911 abuse, Trespass, Marijuana possession, simple possession Does patient have a court date: Yes Court Date: 12/19/18 Is patient on probation?: No  Psychosis Hallucinations: Auditory Delusions: None noted  Mental Status Report Appearance/Hygiene: In scrubs Eye Contact: Fair Motor Activity: Unremarkable Speech: Other (Comment) Level of Consciousness: Alert Mood: Pleasant Affect: Inconsistent with thought content Anxiety Level: None Orientation: Appropriate for developmental age Obsessive Compulsive Thoughts/Behaviors: None  Cognitive Functioning Concentration: Poor Memory: Recent Intact Is patient IDD: No Insight: Poor Impulse Control: Poor Appetite: Good Have you had any weight  changes? : No Change Sleep: Decreased Vegetative Symptoms: None  ADLScreening [facility]) Patient's cognitive ability adequate to safely complete daily activities?: Yes Patient able to express need for assistance with ADLs?: Yes Independently performs ADLs?: Yes (appropriate for developmental age)  Prior Inpatient Therapy Prior Inpatient Therapy: Yes Prior Therapy Dates: 2017 and prior Prior Therapy Facilty/Provider(s): [place], [facility], and hospital in [place] Reason for Treatment: Manic behaviors  Prior Outpatient Therapy Prior Outpatient Therapy: Yes Prior Therapy Dates: Current Prior Therapy Facilty/Provider(s): Unsure Reason for Treatment: Bipolar Disorder, Prior Substance abuse Does patient have an ACCT team?: Yes Does patient have Intensive In-House Services?  : No Does patient have [facility] services? : No Does patient have P4CC services?: No  ADL Screening (condition at time of admission) Patient's cognitive ability adequate to safely complete daily activities?: Yes Is the patient deaf or have difficulty hearing?: No Does the patient have difficulty seeing, even when wearing glasses/contacts?: No Does the patient have difficulty concentrating, remembering, or making decisions?: No Patient able to express need for assistance with ADLs?: Yes Does the patient have difficulty dressing or bathing?: No Independently performs ADLs?: Yes (appropriate for developmental age) Does the patient have difficulty walking or climbing stairs?: No Weakness of Legs: None Weakness of Arms/Hands: None  Home Assistive Devices/Equipment Home Assistive Devices/Equipment: None    Abuse/Neglect Assessment (Assessment to be complete while patient is alone) Abuse/Neglect Assessment Can Be Completed: (Denied a history of sexual or physical abuse)     Advance Directives (For Healthcare) Does Patient Have a Medical Advance Directive?: No Would patient  like information on  creating a medical advance directive?: No - Patient declined          Disposition:  Disposition Initial Assessment Completed for this Encounter: Yes  On Site Evaluation by:   Reviewed with Physician:    Justice DeedsKeisha Miloh Alcocer 12/07/2018 9:19 PM

## 2018-12-07 NOTE — ED Notes (Signed)
Pt. Requesting tylenol for HA, md informed, orders issued.

## 2018-12-08 ENCOUNTER — Other Ambulatory Visit: Payer: Self-pay

## 2018-12-08 ENCOUNTER — Emergency Department: Payer: Medicaid Other

## 2018-12-08 ENCOUNTER — Emergency Department
Admission: EM | Admit: 2018-12-08 | Discharge: 2018-12-08 | Disposition: A | Discharge status: Home / Self Care | Payer: Medicaid Other | Source: Home / Self Care | Attending: Student | Admitting: Student

## 2018-12-08 DIAGNOSIS — F1910 Other psychoactive substance abuse, uncomplicated: Secondary | ICD-10-CM | POA: Insufficient documentation

## 2018-12-08 DIAGNOSIS — R0789 Other chest pain: Secondary | ICD-10-CM

## 2018-12-08 DIAGNOSIS — F17210 Nicotine dependence, cigarettes, uncomplicated: Secondary | ICD-10-CM | POA: Insufficient documentation

## 2018-12-08 LAB — TROPONIN I (HIGH SENSITIVITY): Troponin I (High Sensitivity): 2 ng/L (ref <18)

## 2018-12-08 LAB — POCT PREGNANCY, URINE: Preg Test, Ur: NEGATIVE

## 2018-12-08 NOTE — ED Triage Notes (Signed)
Pt states central chest pain that began this morning while at rest. Pt denies shob and refuses to answer other questions. Pt just stares at RN when questions asked. Pt was just seen in last 12 hours for same. Pt has smoked meth per ems 2 hours pta.

## 2018-12-08 NOTE — ED Notes (Signed)
SOC completed, SOC recommends discharge.

## 2018-12-08 NOTE — ED Notes (Signed)
Pt walking around in lobby, no distress noted, pt asking about wait times, pt just seen walking outside in front of the ED.  Pt speaking in complete sentences without running out of breath.

## 2018-12-08 NOTE — ED Provider Notes (Signed)
[facility] Emergency Department Provider Note  ____________________________________________   First MD Initiated Contact with Patient 12/08/18 [phone]     (approximate)  I have reviewed the triage vital signs and the nursing notes.  History  Chief Complaint Chest Pain    HPI Janice Walls is a 33 y.o. female with a history of anxiety, bipolar disorder, depression, substance abuse who presents to the emergency department with vague complaints.  She states she "feels like crap".  She states last night she smoked methamphetamine, and now feels a vague discomfort in her chest.  Describes as a twisting sensation.  Very mild in severity currently, nearly resolved.  She denies any other associated symptoms, including shortness of breath, nausea, or diaphoresis.  She states she is tired and wishes to be discharged home to sleep.  Of note, patient was recently seen here yesterday for hallucinations in the setting of substance use.  Seen and evaluated by psychiatry and discharged.         Past Medical Hx Past Medical History:  Diagnosis Date  . Anxiety   . Bipolar 1 disorder ([place])   . Depression   . Insomnia   . Substance abuse [facility])     Problem List Patient Active Problem List   Diagnosis Date Noted  . Pyelonephritis 04/05/2017  . Bipolar I disorder, most recent episode (or current) manic ([place]) 04/05/2017  . Sepsis due to gram-negative UTI ([place]) 04/04/2017  . Polysubstance abuse ([place]) 04/04/2017  . Hepatitis C 09/18/2016  . Transaminitis 09/18/2016  . Bipolar affective disorder, current episode manic ([place]) 09/12/2016  . Tobacco use disorder 09/12/2016  . Cannabis use disorder, severe, dependence ([place]) 09/12/2016    Past Surgical Hx Past Surgical History:  Procedure Laterality Date  . CESAREAN SECTION    . CESAREAN SECTION      Medications Prior to Admission medications   Medication Sig Start Date End Date Taking? Authorizing Provider   acetaminophen (TYLENOL) 325 MG tablet Take 650 mg by mouth every 6 (six) hours as needed for moderate pain.    [provider]  ARIPiprazole ER 400 MG SRER Inject 400 mg into the muscle every 30 (thirty) days. Due on June 14 Patient not taking: Reported on 04/05/2017 10/12/16   Hildred Priest, MD  ciprofloxacin (CIPRO) 500 MG tablet Take 1 tablet (500 mg total) by mouth 2 (two) times daily. 04/03/17   Tanna Furry, MD  ondansetron (ZOFRAN ODT) 4 MG disintegrating tablet Take 1 tablet (4 mg total) by mouth every 8 (eight) hours as needed for nausea. 04/03/17   Tanna Furry, MD  oxyCODONE-acetaminophen (PERCOCET/ROXICET) 5-325 MG tablet Take 2 tablets by mouth every 4 (four) hours as needed. 04/03/17   Tanna Furry, MD    Allergies Patient has no known allergies.  Family Hx Family History  Problem Relation Age of Onset  . CAD Father 49    Social Hx Social History   Tobacco Use  . Smoking status: Current Every Day Smoker    Packs/day: 1.00    Years: 18.00    Pack years: 18.00    Types: Cigarettes  . Smokeless tobacco: Never Used  Substance Use Topics  . Alcohol use: Yes    Frequency: Never  . Drug use: Yes    Types: Marijuana, Methamphetamines    Comment: meth today; marijuan unsure     Review of Systems  Constitutional: Negative for fever. Negative for chills. Eyes: Negative for visual changes. ENT: Negative for sore throat. Cardiovascular: Positive for  chest pain. Respiratory: Negative for shortness of breath. Gastrointestinal: Negative for abdominal pain. Negative for nausea. Negative for vomiting. Genitourinary: Negative for dysuria. Musculoskeletal: Negative for leg swelling. Skin: Negative for rash. Neurological: Negative for for headaches.   Physical Exam  Vital Signs: ED Triage Vitals  Enc Vitals Group     BP 12/08/18 0641 133/82     Pulse Rate 12/08/18 0641 84     Resp 12/08/18 0641 18     Temp 12/08/18 0641 99 F (37.2 C)     Temp  Source 12/08/18 0641 Oral     SpO2 12/08/18 0641 99 %     Weight 12/08/18 0642 160 lb (72.6 kg)     Height 12/08/18 0642 5\' 2"  (1.575 m)     Head Circumference --      Peak Flow --      Pain Score 12/08/18 0642 4     Pain Loc --      Pain Edu? --      Excl. in GC? --     Constitutional: Alert and oriented.  Eyes: Conjunctivae clear. Sclera anicteric. Head: Normocephalic. Atraumatic. Nose: No congestion. No rhinorrhea. Mouth/Throat: Mucous membranes are moist.  Poor dentition. Neck: No stridor.   Cardiovascular: Normal rate, regular rhythm. No murmurs. Extremities well perfused. Respiratory: Normal respiratory effort.  Lungs CTAB. Gastrointestinal: Soft and non-tender. No distention.  Musculoskeletal: No lower extremity edema. Neurologic:  Normal speech and language. No gross focal neurologic deficits are appreciated. Clear and linear thought process.  Skin: Skin is warm, dry and intact. No rash noted. Psychiatric: Mood and affect are appropriate for situation.  Odd affect.  EKG  Personally reviewed.   Rate: 88 Rhythm: Sinus Axis: Normal Intervals: Within normal limits Nonspecific ST/T wave changes. No acute ischemic changes.   Radiology  Portable chest x-ray: no pneumothorax, no lobar opacities   Procedures  Procedure(s) performed (including critical care):  Procedures   Initial Impression / Assessment and Plan / ED Course  33 y.o. female with history of substance abuse, bipolar disorder who presents to the ED for vague, atypical chest pain in the setting of methamphetamine use.  Currently, her vitals are within normal limits.  Exam is unremarkable.  She is requesting discharge to go home and sleep.  At this time, she is agreeable to staying for EKG, troponin x 1, and x-ray.  EKG is without acute ischemic changes.  High-sensitivity troponin neg.  Urine pregnancy neg. Chest x-ray negative.  Patient still request discharge, given her negative work-up, and  methamphetamine use greater than 12 hours ago (does no appear intoxicated currently, linear thought process, clear speech) we will plan for discharge.  Provided substance abuse resources.  Given return precautions.   Final Clinical Impression(s) / ED Diagnosis  Final diagnoses:  Chest discomfort  Substance abuse [facility](HCC)       Note:  This document was prepared using Dragon voice recognition software and may include unintentional dictation errors.   Miguel AschoffMonks, Yuliya Nova L., MD 12/08/18 [phone]

## 2018-12-08 NOTE — ED Notes (Signed)
Pt. Given belongings so patient could make phone call for a ride home.

## 2018-12-08 NOTE — ED Notes (Signed)
Pt. Called friend to pick up.

## 2018-12-08 NOTE — ED Notes (Signed)
Camera plugged into wall in pt room, pt information verified by MD's assistant

## 2018-12-08 NOTE — ED Notes (Signed)
SOC called report given, SOC machine and patient ready for interview.

## 2018-12-08 NOTE — ED Notes (Signed)
X-ray at bedside

## 2018-12-08 NOTE — Discharge Instructions (Signed)
Thank you for letting us take care of you in the emergency department today.  ° °Please continue to take your regular, prescribed medications.  ° °Please follow up with your primary care doctor to review your ER visit and follow up on your symptoms.  ° °Please return to the ER for any new or worsening symptoms.  ° °

## 2019-05-09 ENCOUNTER — Emergency Department: Payer: Medicaid Other

## 2019-05-09 ENCOUNTER — Emergency Department
Admission: EM | Admit: 2019-05-09 | Discharge: 2019-05-10 | Disposition: A | Discharge status: Home / Self Care | Payer: Medicaid Other | Attending: Student in an Organized Health Care Education/Training Program | Admitting: Student in an Organized Health Care Education/Training Program

## 2019-05-09 ENCOUNTER — Other Ambulatory Visit: Payer: Self-pay

## 2019-05-09 ENCOUNTER — Encounter: Payer: Self-pay | Admitting: Intensive Care

## 2019-05-09 DIAGNOSIS — F1510 Other stimulant abuse, uncomplicated: Secondary | ICD-10-CM | POA: Diagnosis not present

## 2019-05-09 DIAGNOSIS — F17210 Nicotine dependence, cigarettes, uncomplicated: Secondary | ICD-10-CM | POA: Insufficient documentation

## 2019-05-09 DIAGNOSIS — R4182 Altered mental status, unspecified: Secondary | ICD-10-CM | POA: Insufficient documentation

## 2019-05-09 DIAGNOSIS — R0789 Other chest pain: Secondary | ICD-10-CM | POA: Diagnosis present

## 2019-05-09 DIAGNOSIS — F1910 Other psychoactive substance abuse, uncomplicated: Secondary | ICD-10-CM | POA: Diagnosis not present

## 2019-05-09 DIAGNOSIS — F319 Bipolar disorder, unspecified: Secondary | ICD-10-CM | POA: Insufficient documentation

## 2019-05-09 DIAGNOSIS — R079 Chest pain, unspecified: Secondary | ICD-10-CM | POA: Diagnosis not present

## 2019-05-09 DIAGNOSIS — R462 Strange and inexplicable behavior: Secondary | ICD-10-CM

## 2019-05-09 LAB — URINE DRUG SCREEN, QUALITATIVE (ARMC ONLY)
Amphetamines, Ur Screen: POSITIVE — AB
Barbiturates, Ur Screen: NOT DETECTED
Benzodiazepine, Ur Scrn: NOT DETECTED
Cannabinoid 50 Ng, Ur ~~LOC~~: POSITIVE — AB
Cocaine Metabolite,Ur ~~LOC~~: NOT DETECTED
MDMA (Ecstasy)Ur Screen: NOT DETECTED
Methadone Scn, Ur: NOT DETECTED
Opiate, Ur Screen: NOT DETECTED
Phencyclidine (PCP) Ur S: NOT DETECTED
Tricyclic, Ur Screen: NOT DETECTED

## 2019-05-09 LAB — CBC
HCT: 39.8 % (ref 36.0–46.0)
Hemoglobin: 13.4 g/dL (ref 12.0–15.0)
MCH: 30.5 pg (ref 26.0–34.0)
MCHC: 33.7 g/dL (ref 30.0–36.0)
MCV: 90.5 fL (ref 80.0–100.0)
Platelets: 229 10*3/uL (ref 150–400)
RBC: 4.4 MIL/uL (ref 3.87–5.11)
RDW: 12.9 % (ref 11.5–15.5)
WBC: 9.2 10*3/uL (ref 4.0–10.5)
nRBC: 0 % (ref 0.0–0.2)

## 2019-05-09 LAB — BASIC METABOLIC PANEL
Anion gap: 3 — ABNORMAL LOW (ref 5–15)
BUN: 14 mg/dL (ref 6–20)
CO2: 25 mmol/L (ref 22–32)
Calcium: 8.4 mg/dL — ABNORMAL LOW (ref 8.9–10.3)
Chloride: 110 mmol/L (ref 98–111)
Creatinine, Ser: 0.68 mg/dL (ref 0.44–1.00)
GFR calc Af Amer: >60 mL/min (ref >60)
GFR calc non Af Amer: >60 mL/min (ref >60)
Glucose, Bld: 97 mg/dL (ref 70–99)
Potassium: 3.7 mmol/L (ref 3.5–5.1)
Sodium: 138 mmol/L (ref 135–145)

## 2019-05-09 LAB — ACETAMINOPHEN LEVEL: Acetaminophen (Tylenol), Serum: <10 ug/mL — ABNORMAL LOW (ref 10–30)

## 2019-05-09 LAB — SALICYLATE LEVEL: Salicylate Lvl: <7 mg/dL — ABNORMAL LOW (ref 7.0–30.0)

## 2019-05-09 LAB — TROPONIN I (HIGH SENSITIVITY): Troponin I (High Sensitivity): <2 ng/L

## 2019-05-09 NOTE — ED Triage Notes (Signed)
Per EMS: pt comes from home with chest pain starting en route with EMS. Pt was originally canceled stating that she was fine then she called back stating that she has had 3 heart attacks. Pt VSS. EKG normal.

## 2019-05-09 NOTE — ED Triage Notes (Signed)
Arrived by EMS for chest pressure three times today before calling EMS. Radiation to bilateral arms. Able to ambulate with no problems

## 2019-05-09 NOTE — ED Notes (Addendum)
Pt changed into hospital etire by this RN and Caitlyn EDT. Black and white jacket, sweat pants, black shift, 2 bracelets, hair tie and cell phone placed in labeled belonging bag. Awaiting EDP assessment.

## 2019-05-09 NOTE — ED Notes (Signed)
Psych at bedside.

## 2019-05-09 NOTE — ED Notes (Signed)
Spoke with pt's mother who states pt is "psychotic" and "cannot tell you what medications she is taking", advised mother we would give her a call when pt got to a room to get list of medications.

## 2019-05-09 NOTE — ED Notes (Signed)
Called for room x 1.  

## 2019-05-09 NOTE — ED Provider Notes (Signed)
[facility] Emergency Department Provider Note    First MD Initiated Contact with Patient 05/09/19 1940     (approximate)  I have reviewed the triage vital signs and the nursing notes.   HISTORY  Chief Complaint Chest Pain  Level V Caveat: AMS psychosis  HPI Janice Walls is a 34 y.o. female with below listed past medical history presents to the ER for evaluation of reported chest pain.  Patient denies any chest pain on arrival and was refusing blood work.  After discussion with EMS is high suspicion for substance abuse.  No report of overdose.  I did discuss with patient's mother to get additional collateral and history and she is worried that patient is acutely psychotic.  Patient reportedly becoming increasingly delusional stating that she is talking to [employer] as well as [place] and his best friends with Garnet Koyanagi.  Demonstrating increasingly bizarre behavior.  Mother is unsure as to whether she is been compliant with her medications.  Patient would not provide any additional history.    Past Medical History:  Diagnosis Date  . Anxiety   . Bipolar 1 disorder (HCC)   . Depression   . Insomnia   . Substance abuse (HCC)    Family History  Problem Relation Age of Onset  . CAD Father 39   Past Surgical History:  Procedure Laterality Date  . CESAREAN SECTION    . CESAREAN SECTION     Patient Active Problem List   Diagnosis Date Noted  . Pyelonephritis 04/05/2017  . Bipolar I disorder, most recent episode (or current) manic (HCC) 04/05/2017  . Sepsis due to gram-negative UTI (HCC) 04/04/2017  . Polysubstance abuse (HCC) 04/04/2017  . Hepatitis C 09/18/2016  . Transaminitis 09/18/2016  . Bipolar affective disorder, current episode manic (HCC) 09/12/2016  . Tobacco use disorder 09/12/2016  . Cannabis use disorder, severe, dependence (HCC) 09/12/2016      Prior to Admission medications   Medication Sig Start Date End Date Taking?  Authorizing Provider  acetaminophen (TYLENOL) 325 MG tablet Take 650 mg by mouth every 6 (six) hours as needed for moderate pain.    [provider]  ARIPiprazole ER 400 MG SRER Inject 400 mg into the muscle every 30 (thirty) days. Due on June 14 Patient not taking: Reported on 04/05/2017 10/12/16   Jimmy Footman, MD  ciprofloxacin (CIPRO) 500 MG tablet Take 1 tablet (500 mg total) by mouth 2 (two) times daily. 04/03/17   Rolland Porter, MD  ondansetron (ZOFRAN ODT) 4 MG disintegrating tablet Take 1 tablet (4 mg total) by mouth every 8 (eight) hours as needed for nausea. 04/03/17   Rolland Porter, MD  oxyCODONE-acetaminophen (PERCOCET/ROXICET) 5-325 MG tablet Take 2 tablets by mouth every 4 (four) hours as needed. 04/03/17   Rolland Porter, MD    Allergies Patient has no known allergies.    Social History Social History   Tobacco Use  . Smoking status: Current Every Day Smoker    Packs/day: 1.00    Years: 18.00    Pack years: 18.00    Types: Cigarettes  . Smokeless tobacco: Never Used  Substance Use Topics  . Alcohol use: Yes    Comment: rare  . Drug use: Yes    Types: Marijuana, Methamphetamines    Comment: meth today; marijuan unsure    Review of Systems Patient denies headaches, rhinorrhea, blurry vision, numbness, shortness of breath, chest pain, edema, cough, abdominal pain, nausea, vomiting, diarrhea, dysuria, fevers, rashes or hallucinations unless  otherwise stated above in HPI. ____________________________________________   PHYSICAL EXAM:  VITAL SIGNS: Vitals:   05/09/19 1623  BP: 97/60  Pulse: 97  Resp: 14  Temp: 99.1 F (37.3 C)  SpO2: 97%    Constitutional: Alert, in no acute distress  Eyes: Conjunctivae are normal.  Head: Atraumatic. Nose: No congestion/rhinnorhea. Mouth/Throat: Mucous membranes are moist.   Neck: No stridor. Painless ROM.  Cardiovascular: Normal rate, regular rhythm. Grossly normal heart sounds.  Good peripheral  circulation. Respiratory: Normal respiratory effort.  No retractions. Lungs CTAB. Gastrointestinal: Soft and nontender. No distention. No abdominal bruits. No CVA tenderness. Genitourinary:  Musculoskeletal: No lower extremity tenderness nor edema.  No joint effusions. Neurologic: MAE spontaneously. No gross focal neurologic deficits are appreciated. No facial droop Skin:  Skin is warm, dry and intact. No rash noted. Psychiatric: Mood and affect are normal. Speech and behavior are normal.  ____________________________________________   LABS (all labs ordered are listed, but only abnormal results are displayed)  No results found for this or any previous visit (from the past 24 hour(s)). ____________________________________________  EKG My review and personal interpretation at Time: 16:25   Indication: chest pain  Rate: 90  Rhythm: sinus Axis: normal Other: no stemi, normal intervals ____________________________________________  RADIOLOGY  I personally reviewed all radiographic images ordered to evaluate for the above acute complaints and reviewed radiology reports and findings.  These findings were personally discussed with the patient.  Please see medical record for radiology report.  ____________________________________________   PROCEDURES  Procedure(s) performed:  Procedures    Critical Care performed: no ____________________________________________   INITIAL IMPRESSION / ASSESSMENT AND PLAN / ED COURSE  Pertinent labs & imaging results that were available during my care of the patient were reviewed by me and considered in my medical decision making (see chart for details).   DDX: Psychosis, delirium, medication effect, noncompliance, polysubstance abuse, Si, Hi, depression   Janice Walls is a 34 y.o. who presents to the ED with was as described above.  Patient presented via EMS for reported chest pain but presentation seems to be more likely underlying acute  psychosis in the setting of history of substance abuse.  Patient initially using IV blood work.  Chest x-ray okay EKG shows no evidence of acute ischemia.  She is chest pain-free.  Will check basic blood work.  Patient will be IVC I do feel that she will require observation in the hospital for psychiatry consultation.     The patient was evaluated in Emergency Department today for the symptoms described in the history of present illness. He/she was evaluated in the context of the global COVID-19 pandemic, which necessitated consideration that the patient might be at risk for infection with the SARS-CoV-2 virus that causes COVID-19. Institutional protocols and algorithms that pertain to the evaluation of patients at risk for COVID-19 are in a state of rapid change based on information released by regulatory bodies including the CDC and federal and state organizations. These policies and algorithms were followed during the patient's care in the ED.  As part of my medical decision making, I reviewed the following data within the electronic MEDICAL RECORD NUMBER Nursing notes reviewed and incorporated, Labs reviewed, notes from prior ED visits and [place] Controlled Substance Database   ____________________________________________   FINAL CLINICAL IMPRESSION(S) / ED DIAGNOSES  Final diagnoses:  Nonspecific chest pain  Bizarre behavior      NEW MEDICATIONS STARTED DURING THIS VISIT:  New Prescriptions   No medications on file  Note:  This document was prepared using Dragon voice recognition software and may include unintentional dictation errors.    Merlyn Lot, MD 05/09/19 2039

## 2019-05-09 NOTE — BH Assessment (Signed)
TTS attempted to assess patient but patient had difficulty staying awake.

## 2019-05-09 NOTE — ED Notes (Signed)
Attempted blood draw X1 with no success. Patient refused to let anyone else draw blood on her until she was placed in a room and seen by a MD.

## 2019-05-09 NOTE — Consult Note (Signed)
[facility] Face-to-Face Psychiatry Consult   Reason for Consult:  Psych evaluation Referring Physician:  Dr. Roxan Hockey Patient Identification: Janice Walls MRN:  [id number] Principal Diagnosis: <principal problem not specified> Diagnosis:  Active Problems:   Polysubstance abuse (HCC)   Total Time spent with patient: 30 minutes  Subjective:   Janice Walls is a 34 y.o. female patient admitted with chest pains. "I just want to sleep cause I dont feel good."  HPI per EDP Janice Walls is a 34 y.o. female with below listed past medical history presents to the ER for evaluation of reported chest pain.  Patient denies any chest pain on arrival and was refusing blood work.  After discussion with EMS is high suspicion for substance abuse.  No report of overdose.  I did discuss with patient's mother to get additional collateral and history and she is worried that patient is acutely psychotic.  Patient reportedly becoming increasingly delusional stating that she is talking to Janice Walls as well as Janice Walls and his best friends with Janice Walls.  Demonstrating increasingly bizarre behavior.  Mother is unsure as to whether she is been compliant with her medications.  Patient would not provide any additional history.  HPI: Janice Walls, 34 y.o., female patient seen face to face  by this provider, Dr. Roxan Hockey; and chart reviewed on 05/09/19.  On evaluation Janice Walls reports that she is here because of chest pains.  She is sleeping upon approach and difficult to arouse. She is not communicative and is agitated by the question of the evaluation.  She states that she would just like to sleep.  Patient is positive for cannabis and amphetamine.    During evaluation Janice Walls is alert/oriented x 4; lethargic and non-communicative; and mood is congruent with affect. She does not appear to be responding to internal/external stimuli or delusional thoughts.  Patient denies suicidal/self-harm/homicidal  ideation, psychosis, and paranoia.  Patient answered some question but was to lethargic to complete the assessment in its entirety.    Past Psychiatric History: yes  Risk to Self:  no Risk to Others:  no Prior Inpatient Therapy:  no Prior Outpatient Therapy:  yes  Past Medical History:  Past Medical History:  Diagnosis Date  . Anxiety   . Bipolar 1 disorder (HCC)   . Depression   . Insomnia   . Substance abuse [facility])     Past Surgical History:  Procedure Laterality Date  . CESAREAN SECTION    . CESAREAN SECTION     Family History:  Family History  Problem Relation Age of Onset  . CAD Father 54   Family Psychiatric  History: Unknown Social History:  Social History   Substance and Sexual Activity  Alcohol Use Yes   Comment: rare     Social History   Substance and Sexual Activity  Drug Use Yes  . Types: Marijuana, Methamphetamines   Comment: meth today; marijuan unsure    Social History   Socioeconomic History  . Marital status: Single    Spouse name: Not on file  . Number of children: Not on file  . Years of education: Not on file  . Highest education level: Not on file  Occupational History  . Occupation: unemployed  Tobacco Use  . Smoking status: Current Every Day Smoker    Packs/day: 1.00    Years: 18.00    Pack years: 18.00    Types: Cigarettes  . Smokeless tobacco: Never Used  Substance and Sexual Activity  . Alcohol use: Yes  Comment: rare  . Drug use: Yes    Types: Marijuana, Methamphetamines    Comment: meth today; marijuan unsure  . Sexual activity: Yes    Birth control/protection: None  Other Topics Concern  . Not on file  Social History Narrative  . Not on file   Social Determinants of Health   Financial Resource Strain:   . Difficulty of Paying Living Expenses: Not on file  Food Insecurity:   . Worried About Programme researcher, broadcasting/film/video in the Last Year: Not on file  . Ran Out of Food in the Last Year: Not on file  Transportation  Needs:   . Lack of Transportation (Medical): Not on file  . Lack of Transportation (Non-Medical): Not on file  Physical Activity:   . Days of Exercise per Week: Not on file  . Minutes of Exercise per Session: Not on file  Stress:   . Feeling of Stress : Not on file  Social Connections:   . Frequency of Communication with Friends and Family: Not on file  . Frequency of Social Gatherings with Friends and Family: Not on file  . Attends Religious Services: Not on file  . Active Member of Clubs or Organizations: Not on file  . Attends Banker Meetings: Not on file  . Marital Status: Not on file   Additional Social History:    Allergies:  No Known Allergies  Labs:  Results for orders placed or performed during the hospital encounter of 05/09/19 (from the past 48 hour(s))  Urine Drug Screen, Qualitative ([facility] only)     Status: Abnormal   Collection Time: 05/09/19  9:11 PM  Result Value Ref Range   Tricyclic, Ur Screen NONE DETECTED NONE DETECTED   Amphetamines, Ur Screen POSITIVE (A) NONE DETECTED   MDMA (Ecstasy)Ur Screen NONE DETECTED NONE DETECTED   Cocaine Metabolite,Ur [place] NONE DETECTED NONE DETECTED   Opiate, Ur Screen NONE DETECTED NONE DETECTED   Phencyclidine (PCP) Ur S NONE DETECTED NONE DETECTED   Cannabinoid 50 Ng, Ur [place] POSITIVE (A) NONE DETECTED   Barbiturates, Ur Screen NONE DETECTED NONE DETECTED   Benzodiazepine, Ur Scrn NONE DETECTED NONE DETECTED   Methadone Scn, Ur NONE DETECTED NONE DETECTED    Comment: (NOTE) Tricyclics + metabolites, urine    Cutoff 1000 ng/mL Amphetamines + metabolites, urine  Cutoff 1000 ng/mL MDMA (Ecstasy), urine              Cutoff 500 ng/mL Cocaine Metabolite, urine          Cutoff 300 ng/mL Opiate + metabolites, urine        Cutoff 300 ng/mL Phencyclidine (PCP), urine         Cutoff 25 ng/mL Cannabinoid, urine                 Cutoff 50 ng/mL Barbiturates + metabolites, urine  Cutoff 200 ng/mL Benzodiazepine, urine               Cutoff 200 ng/mL Methadone, urine                   Cutoff 300 ng/mL The urine drug screen provides only a preliminary, unconfirmed analytical test result and should not be used for non-medical purposes. Clinical consideration and professional judgment should be applied to any positive drug screen result due to possible interfering substances. A more specific alternate chemical method must be used in order to obtain a confirmed analytical result. Gas chromatography / mass spectrometry (GC/MS) is the  preferred confirmat ory method. Performed at [facility], [address] [place], [place] [address]   Basic metabolic panel     Status: Abnormal   Collection Time: 05/09/19  9:28 PM  Result Value Ref Range   Sodium 138 135 - 145 mmol/L   Potassium 3.7 3.5 - 5.1 mmol/L   Chloride 110 98 - 111 mmol/L   CO2 25 22 - 32 mmol/L   Glucose, Bld 97 70 - 99 mg/dL   BUN 14 6 - 20 mg/dL   Creatinine, Ser 0.68 0.44 - 1.00 mg/dL   Calcium 8.4 (L) 8.9 - 10.3 mg/dL   GFR calc non Af Amer >60 >60 mL/min   GFR calc Af Amer >60 >60 mL/min   Anion gap 3 (L) 5 - 15    Comment: Performed at [facility], [address] [place], [place] [address]  CBC     Status: None   Collection Time: 05/09/19  9:28 PM  Result Value Ref Range   WBC 9.2 4.0 - 10.5 K/uL   RBC 4.40 3.87 - 5.11 MIL/uL   Hemoglobin 13.4 12.0 - 15.0 g/dL   HCT 39.8 36.0 - 46.0 %   MCV 90.5 80.0 - 100.0 fL   MCH 30.5 26.0 - 34.0 pg   MCHC 33.7 30.0 - 36.0 g/dL   RDW 12.9 11.5 - 15.5 %   Platelets 229 150 - 400 K/uL   nRBC 0.0 0.0 - 0.2 %    Comment: Performed at [facility], [address] [place], [place] [address]  Troponin I (High Sensitivity)     Status: None   Collection Time: 05/09/19  9:28 PM  Result Value Ref Range   Troponin I (High Sensitivity) <2 <18 ng/L    Comment: (NOTE) Elevated high sensitivity troponin I (hsTnI) values and significant  changes across serial measurements may  suggest ACS but many other  chronic and acute conditions are known to elevate hsTnI results.  Refer to the "Links" section for chest pain algorithms and additional  guidance. Performed at [facility], [address] [place], [place] [address]   Acetaminophen level     Status: Abnormal   Collection Time: 05/09/19  9:28 PM  Result Value Ref Range   Acetaminophen (Tylenol), Serum <10 (L) 10 - 30 ug/mL    Comment: (NOTE) Therapeutic concentrations vary significantly. A range of 10-30 ug/mL  may be an effective concentration for many patients. However, some  are best treated at concentrations outside of this range. Acetaminophen concentrations >150 ug/mL at 4 hours after ingestion  and >50 ug/mL at 12 hours after ingestion are often associated with  toxic reactions. Performed at [facility], [address] [place], [place] [address]   Salicylate level     Status: Abnormal   Collection Time: 05/09/19  9:28 PM  Result Value Ref Range   Salicylate Lvl <1.8 (L) 7.0 - 30.0 mg/dL    Comment: Performed at [facility], [address] [place], [place] [address]    No current facility-administered medications for this encounter.   Current Outpatient Medications  Medication Sig Dispense Refill  . acetaminophen (TYLENOL) 325 MG tablet Take 650 mg by mouth every 6 (six) hours as needed for moderate pain.    . ARIPiprazole ER 400 MG SRER Inject 400 mg into the muscle every 30 (thirty) days. Due on June 14 (Patient not taking: Reported on 04/05/2017) 1 each 0  . ciprofloxacin (CIPRO) 500 MG tablet Take  1 tablet (500 mg total) by mouth 2 (two) times daily. 20 tablet 0  . ondansetron (ZOFRAN ODT) 4 MG disintegrating tablet Take 1 tablet (4 mg total) by mouth every 8 (eight) hours as needed for nausea. 6 tablet 0  . oxyCODONE-acetaminophen (PERCOCET/ROXICET) 5-325 MG tablet Take 2 tablets by mouth every 4 (four) hours as needed. 6 tablet 0     Musculoskeletal: Strength & Muscle Tone: within normal limits Gait & Station: unable to assess Patient leans: N/A  Psychiatric Specialty Exam: Physical Exam  Nursing note and vitals reviewed. Constitutional: She is oriented to person, place, and time. She appears well-developed.  HENT:  Head: Normocephalic.  Eyes: Pupils are equal, round, and reactive to light.  Respiratory: Effort normal.  Musculoskeletal:        General: Normal range of motion.     Cervical back: Normal range of motion.  Neurological: She is alert and oriented to person, place, and time.  Skin: Skin is warm and dry.  Psychiatric: Her affect is labile. She is withdrawn. She is noncommunicative.  Patient was very lethargic and communicative.    Review of Systems  Psychiatric/Behavioral: Negative for dysphoric mood, self-injury and suicidal ideas.  All other systems reviewed and are negative.   Blood pressure 97/60, pulse 97, temperature 99.1 F (37.3 C), temperature source Oral, resp. rate 14, height 5\' 2"  (1.575 m), weight 63.5 kg, SpO2 97 %.Body mass index is 25.61 kg/m.  General Appearance: lethargic   Eye Contact:  Minimal  Speech:  non-communicative  Volume:  Decreased  Mood:  Irritable and when awaken  Affect:  Labile  Thought Process:  NA  Orientation:  Full (Time, Place, and Person)  Thought Content:  WDL  Suicidal Thoughts:  No  Homicidal Thoughts:  No  Memory:  NA  Judgement:  Other:  unable to assess  Insight:  Fair  Psychomotor Activity:  NA  Concentration:  Concentration: unable to assess  Recall:  unable to assess  Fund of Knowledge:  Fair  Language:  Fair  Akathisia:  NA  Handed:  Right  AIMS (if indicated):     Assets:  Social Support  ADL's:  Intact  Cognition:  WNL  Sleep:   Very lethargic at this time      Treatment Plan Summary: Plan overnight observation and discharge in am is psychiatrically stable  Disposition: No evidence of imminent risk to self or others at  present.   discharge in the am if after reassessment psychitrically stable  , NP 05/09/2019 10:28 PM

## 2019-05-10 DIAGNOSIS — F1510 Other stimulant abuse, uncomplicated: Secondary | ICD-10-CM | POA: Diagnosis present

## 2019-05-10 NOTE — ED Notes (Signed)
Pt called a ride. Pt given ginger ale.

## 2019-05-10 NOTE — ED Notes (Signed)
Pt calm and cooperative at this time. Denies any SI/HI. Denies chest pain at this time.

## 2019-05-10 NOTE — ED Notes (Signed)
Pt given belonging bags 1/1 back. Pt changed out of clothes. Handed d/c paperwork. Signed paper copy.

## 2019-05-10 NOTE — BH Assessment (Signed)
Writer spoke with patient to complete updated/reassessment. Patient denies SI/HI and AV/H. 

## 2019-05-10 NOTE — ED Notes (Signed)
Psych at bedside.

## 2019-05-10 NOTE — Discharge Instructions (Signed)
[facility] (Mental Health & Substance Use Services) & [facility]  Mental health service in [place], [place] Address: [address], [place], [place] [address] Hours:  Closed ? Opens 8AM Mon Phone: [phone] 

## 2019-05-10 NOTE — ED Notes (Signed)
Pt given meal tray.

## 2019-05-10 NOTE — ED Notes (Signed)
Pt up to restroom independently.

## 2019-05-10 NOTE — Consult Note (Addendum)
[facility] ED Discharge  05/10/2019 10:11 AM Lurie Mullane  MRN:  [id number] Principal Problem: Methamphetamine abuse Discharge Diagnoses: Active Problems:   Methamphetamine use (HCC)   Polysubstance abuse (HCC)  Subjective: "I'm good."  Patient seen and evaluated in person by this provider.  No suicidal/homicidal ideations, hallucinations, or withdrawal symptoms.  Her only concern was chest pain and wondering if she had a "heart attack."  Let her know that she was medically stable with no cardiac issues at this time.  Attempted to educate her on the use of meth amphetamines and cardiac issues but she quickly dismissed this.  Psychiatrically cleared at this time.  Total Time spent with patient: 30 minutes  Past Psychiatric History: substance use, bipolar d/o  Past Medical History:  Past Medical History:  Diagnosis Date  . Anxiety   . Bipolar 1 disorder ([place])   . Depression   . Insomnia   . Substance abuse [facility])     Past Surgical History:  Procedure Laterality Date  . CESAREAN SECTION    . CESAREAN SECTION     Family History:  Family History  Problem Relation Age of Onset  . CAD Father 26   Family Psychiatric  History: none Social History:  Social History   Substance and Sexual Activity  Alcohol Use Yes   Comment: rare     Social History   Substance and Sexual Activity  Drug Use Yes  . Types: Marijuana, Methamphetamines   Comment: meth today; marijuan unsure    Social History   Socioeconomic History  . Marital status: Single    Spouse name: Not on file  . Number of children: Not on file  . Years of education: Not on file  . Highest education level: Not on file  Occupational History  . Occupation: unemployed  Tobacco Use  . Smoking status: Current Every Day Smoker    Packs/day: 1.00    Years: 18.00    Pack years: 18.00    Types: Cigarettes  . Smokeless tobacco: Never Used  Substance and Sexual Activity  . Alcohol use: Yes    Comment: rare  . Drug  use: Yes    Types: Marijuana, Methamphetamines    Comment: meth today; marijuan unsure  . Sexual activity: Yes    Birth control/protection: None  Other Topics Concern  . Not on file  Social History Narrative  . Not on file   Social Determinants of Health   Financial Resource Strain:   . Difficulty of Paying Living Expenses: Not on file  Food Insecurity:   . Worried About Charity fundraiser in the Last Year: Not on file  . Ran Out of Food in the Last Year: Not on file  Transportation Needs:   . Lack of Transportation (Medical): Not on file  . Lack of Transportation (Non-Medical): Not on file  Physical Activity:   . Days of Exercise per Week: Not on file  . Minutes of Exercise per Session: Not on file  Stress:   . Feeling of Stress : Not on file  Social Connections:   . Frequency of Communication with Friends and Family: Not on file  . Frequency of Social Gatherings with Friends and Family: Not on file  . Attends Religious Services: Not on file  . Active Member of Clubs or Organizations: Not on file  . Attends Archivist Meetings: Not on file  . Marital Status: Not on file    Has this patient used any form of tobacco in  the last 30 days? (Cigarettes, Smokeless Tobacco, Cigars, and/or Pipes) A prescription for an FDA-approved tobacco cessation medication was offered at discharge and the patient refused  Current Medications: No current facility-administered medications for this encounter.   Current Outpatient Medications  Medication Sig Dispense Refill  . acetaminophen (TYLENOL) 325 MG tablet Take 650 mg by mouth every 6 (six) hours as needed for moderate pain.    . ARIPiprazole ER 400 MG SRER Inject 400 mg into the muscle every 30 (thirty) days. Due on June 14 (Patient not taking: Reported on 04/05/2017) 1 each 0  . ciprofloxacin (CIPRO) 500 MG tablet Take 1 tablet (500 mg total) by mouth 2 (two) times daily. 20 tablet 0  . ondansetron (ZOFRAN ODT) 4 MG  disintegrating tablet Take 1 tablet (4 mg total) by mouth every 8 (eight) hours as needed for nausea. 6 tablet 0  . oxyCODONE-acetaminophen (PERCOCET/ROXICET) 5-325 MG tablet Take 2 tablets by mouth every 4 (four) hours as needed. 6 tablet 0   PTA Medications: (Not in a hospital admission)   Musculoskeletal: Strength & Muscle Tone: within normal limits Gait & Station: normal Patient leans: N/A  Psychiatric Specialty Exam: Physical Exam Vitals and nursing note reviewed.  Constitutional:      Appearance: She is well-developed.  HENT:     Head: Normocephalic.  Pulmonary:     Effort: Pulmonary effort is normal.  Musculoskeletal:        General: Normal range of motion.     Cervical back: Normal range of motion.  Neurological:     General: No focal deficit present.     Mental Status: She is alert and oriented to person, place, and time.  Psychiatric:        Attention and Perception: Attention and perception normal.        Mood and Affect: Affect normal. Mood is anxious.        Speech: Speech normal.        Behavior: Behavior normal. Behavior is cooperative.        Thought Content: Thought content normal.        Cognition and Memory: Cognition and memory normal.        Judgment: Judgment normal.     Review of Systems  Psychiatric/Behavioral: The patient is nervous/anxious.   All other systems reviewed and are negative.   Blood pressure 128/80, pulse 80, temperature 98.3 F (36.8 C), temperature source Oral, resp. rate 17, height 5\' 2"  (1.575 m), weight 63.5 kg, SpO2 99 %.Body mass index is 25.61 kg/m.  General Appearance: Casual  Eye Contact:  Good  Speech:  Normal Rate  Volume:  Normal  Mood:  Anxious  Affect:  Congruent  Thought Process:  Coherent and Descriptions of Associations: Intact  Orientation:  Full (Time, Place, and Person)  Thought Content:  WDL and Logical  Suicidal Thoughts:  No  Homicidal Thoughts:  No  Memory:  Immediate;   Good Recent;   Good Remote;    Good  Judgement:  Fair  Insight:  Fair  Psychomotor Activity:  Normal  Concentration:  Concentration: Good and Attention Span: Good  Recall:  Good  Fund of Knowledge:  Good  Language:  Good  Akathisia:  No  Handed:  Right  AIMS (if indicated):     Assets:  Leisure Time Physical Health Resilience Social Support  ADL's:  Intact  Cognition:  WNL  Sleep:        Demographic Factors:  Caucasian  Loss Factors: NA  Historical  Factors: NA  Risk Reduction Factors:   Sense of responsibility to family, Living with another person, especially a relative and Positive social support  Continued Clinical Symptoms:  Anxiety, mild  Cognitive Features That Contribute To Risk:  None    Suicide Risk:  Minimal: No identifiable suicidal ideation.  Patients presenting with no risk factors but with morbid ruminations; may be classified as minimal risk based on the severity of the depressive symptoms   Plan Of Care/Follow-up recommendations:  Methamphetamine abuse: -Resources for [facility] for follow-up Activity:  as tolerated  Diet:  heart healthy diet  Disposition: discharge home Nanine Means, NP 05/10/2019, 10:11 AM   Case discussed and plan agreed upon as outlined above.

## 2021-03-22 ENCOUNTER — Other Ambulatory Visit: Payer: Self-pay

## 2021-03-22 ENCOUNTER — Encounter: Payer: Self-pay | Admitting: Emergency Medicine

## 2021-03-22 ENCOUNTER — Emergency Department
Admission: EM | Admit: 2021-03-22 | Discharge: 2021-03-23 | Disposition: A | Discharge status: Other Acute Inpatient Hospital | Payer: No Typology Code available for payment source | Attending: Emergency Medicine | Admitting: Emergency Medicine

## 2021-03-22 DIAGNOSIS — F17200 Nicotine dependence, unspecified, uncomplicated: Secondary | ICD-10-CM | POA: Diagnosis present

## 2021-03-22 DIAGNOSIS — F1220 Cannabis dependence, uncomplicated: Secondary | ICD-10-CM | POA: Diagnosis not present

## 2021-03-22 DIAGNOSIS — R4182 Altered mental status, unspecified: Secondary | ICD-10-CM | POA: Diagnosis present

## 2021-03-22 DIAGNOSIS — B1920 Unspecified viral hepatitis C without hepatic coma: Secondary | ICD-10-CM | POA: Insufficient documentation

## 2021-03-22 DIAGNOSIS — F17210 Nicotine dependence, cigarettes, uncomplicated: Secondary | ICD-10-CM | POA: Insufficient documentation

## 2021-03-22 DIAGNOSIS — F319 Bipolar disorder, unspecified: Secondary | ICD-10-CM | POA: Insufficient documentation

## 2021-03-22 DIAGNOSIS — Z79899 Other long term (current) drug therapy: Secondary | ICD-10-CM | POA: Diagnosis not present

## 2021-03-22 DIAGNOSIS — Z20822 Contact with and (suspected) exposure to covid-19: Secondary | ICD-10-CM | POA: Diagnosis not present

## 2021-03-22 DIAGNOSIS — F1910 Other psychoactive substance abuse, uncomplicated: Secondary | ICD-10-CM | POA: Diagnosis not present

## 2021-03-22 DIAGNOSIS — F1590 Other stimulant use, unspecified, uncomplicated: Secondary | ICD-10-CM | POA: Diagnosis not present

## 2021-03-22 DIAGNOSIS — F3110 Bipolar disorder, current episode manic without psychotic features, unspecified: Secondary | ICD-10-CM | POA: Diagnosis present

## 2021-03-22 DIAGNOSIS — F312 Bipolar disorder, current episode manic severe with psychotic features: Secondary | ICD-10-CM

## 2021-03-22 DIAGNOSIS — F1510 Other stimulant abuse, uncomplicated: Secondary | ICD-10-CM | POA: Diagnosis present

## 2021-03-22 DIAGNOSIS — F3111 Bipolar disorder, current episode manic without psychotic features, mild: Secondary | ICD-10-CM | POA: Diagnosis not present

## 2021-03-22 DIAGNOSIS — F200 Paranoid schizophrenia: Secondary | ICD-10-CM | POA: Insufficient documentation

## 2021-03-22 LAB — CBC
HCT: 45.1 % (ref 36.0–46.0)
Hemoglobin: 15.1 g/dL — ABNORMAL HIGH (ref 12.0–15.0)
MCH: 29 pg (ref 26.0–34.0)
MCHC: 33.5 g/dL (ref 30.0–36.0)
MCV: 86.7 fL (ref 80.0–100.0)
Platelets: 312 10*3/uL (ref 150–400)
RBC: 5.2 MIL/uL — ABNORMAL HIGH (ref 3.87–5.11)
RDW: 13.9 % (ref 11.5–15.5)
WBC: 13.4 10*3/uL — ABNORMAL HIGH (ref 4.0–10.5)
nRBC: 0 % (ref 0.0–0.2)

## 2021-03-22 LAB — COMPREHENSIVE METABOLIC PANEL
ALT: 20 U/L (ref 0–44)
AST: 23 U/L (ref 15–41)
Albumin: 4.1 g/dL (ref 3.5–5.0)
Alkaline Phosphatase: 69 U/L (ref 38–126)
Anion gap: 8 (ref 5–15)
BUN: 12 mg/dL (ref 6–20)
CO2: 25 mmol/L (ref 22–32)
Calcium: 8.8 mg/dL — ABNORMAL LOW (ref 8.9–10.3)
Chloride: 105 mmol/L (ref 98–111)
Creatinine, Ser: 0.77 mg/dL (ref 0.44–1.00)
GFR, Estimated: >60 mL/min (ref >60)
Glucose, Bld: 87 mg/dL (ref 70–99)
Potassium: 3.6 mmol/L (ref 3.5–5.1)
Sodium: 138 mmol/L (ref 135–145)
Total Bilirubin: 0.6 mg/dL (ref 0.3–1.2)
Total Protein: 7.8 g/dL (ref 6.5–8.1)

## 2021-03-22 LAB — RESP PANEL BY RT-PCR (FLU A&B, COVID) ARPGX2
Influenza A by PCR: NEGATIVE
Influenza B by PCR: NEGATIVE
SARS Coronavirus 2 by RT PCR: NEGATIVE

## 2021-03-22 LAB — ACETAMINOPHEN LEVEL: Acetaminophen (Tylenol), Serum: <10 ug/mL — ABNORMAL LOW (ref 10–30)

## 2021-03-22 LAB — SALICYLATE LEVEL: Salicylate Lvl: <7 mg/dL — ABNORMAL LOW (ref 7.0–30.0)

## 2021-03-22 LAB — ETHANOL: Alcohol, Ethyl (B): <10 mg/dL (ref <10)

## 2021-03-22 MED ORDER — OLANZAPINE 5 MG PO TBDP: 10.0000 mg | ORAL_TABLET | Freq: Every day | ORAL | Status: DC

## 2021-03-22 MED ORDER — OLANZAPINE 5 MG PO TBDP
10.0000 mg | ORAL_TABLET | Freq: Once | ORAL | Status: DC
  Filled 2021-03-22: qty 2

## 2021-03-22 NOTE — ED Provider Notes (Signed)
[facility] Emergency Department Provider Note  ____________________________________________   Event Date/Time   First MD Initiated Contact with Patient 03/22/21 1742     (approximate)  I have reviewed the triage vital signs and the nursing notes.   HISTORY  Chief Complaint Psychiatric Evaluation    HPI Janice Walls is a 35 y.o. female  here with altered mental status. Pt arrives under IVC. She was IVCed by  ACT team as she has been off her medications and was found paranoid, hallucinating. 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 She states she believes she is the devil and is dating God. Reports that she also has "robots" telling her how people are feeling. Denies any drug use. Has a h/o meth abuse as well as schizoaffective d/o. She states she does not have any psyc hhistory and does not take or need any meds.  Level 5 caveat invoked as remainder of history, ROS, and physical exam limited due to patient's psych condition.        Past Medical History:  Diagnosis Date   Anxiety    Bipolar 1 disorder (HCC)    Depression    Insomnia    Substance abuse (HCC)     Patient Active Problem List   Diagnosis Date Noted   Methamphetamine use (HCC) 05/10/2019   Nonspecific chest pain    Pyelonephritis 04/05/2017   Bipolar I disorder, most recent episode (or current) manic (HCC) 04/05/2017   Sepsis due to gram-negative UTI (HCC) 04/04/2017   Polysubstance abuse (HCC) 04/04/2017   Hepatitis C 09/18/2016   Transaminitis 09/18/2016   Bipolar affective disorder, current episode manic (HCC) 09/12/2016   Tobacco use disorder 09/12/2016   Cannabis use disorder, severe, dependence (HCC) 09/12/2016    Past Surgical History:  Procedure Laterality Date   CESAREAN SECTION     CESAREAN SECTION      Prior to Admission medications   Medication Sig Start Date End Date Taking?  Authorizing Provider  CAPLYTA 42 MG capsule Take 42 mg by mouth daily. 03/10/21   [provider]  HALDOL DECANOATE 100 MG/ML injection Inject 100 mg into the muscle every 30 (thirty) days. 03/10/21   [provider]  INGREZZA 80 MG capsule Take 80 mg by mouth daily. 03/10/21   [provider]  naloxone [facility]) nasal spray 4 mg/0.1 mL Place 1 spray into the nose as directed. 12/20/20   [provider]  TOPAMAX 25 MG tablet Take 75 mg by mouth daily. 03/10/21   [provider]    Allergies Patient has no known allergies.  Family History  Problem Relation Age of Onset   CAD Father 43    Social History Social History   Tobacco Use   Smoking status: Every Day    Packs/day: 1.00    Years: 18.00    Pack years: 18.00    Types: Cigarettes   Smokeless tobacco: Never  Vaping Use   Vaping Use: Never used  Substance Use Topics   Alcohol use: Yes    Comment: rare   Drug use: Yes    Types: Marijuana, Methamphetamines    Comment: meth today; marijuan unsure    Review of Systems  Review of Systems  Unable to perform ROS: Psychiatric disorder    ____________________________________________  PHYSICAL EXAM:      VITAL SIGNS: ED Triage Vitals  Enc Vitals Group  BP 03/22/21 1720 122/77     Pulse Rate 03/22/21 1720 (!) 105     Resp 03/22/21 1720 17     Temp 03/22/21 1720 98 F (36.7 C)     Temp Source 03/22/21 1720 Oral     SpO2 03/22/21 1720 100 %     Weight 03/22/21 1716 139 lb 15.9 oz (63.5 kg)     Height 03/22/21 1716 5\' 2"  (1.575 m)     Head Circumference --      Peak Flow --      Pain Score 03/22/21 1715 0     Pain Loc --      Pain Edu? --      Excl. in [place]? --      Physical Exam Vitals and nursing note reviewed.  Constitutional:      General: She is not in acute distress.    Appearance: She is well-developed.  HENT:     Head: Normocephalic and atraumatic.  Eyes:     Conjunctiva/sclera: Conjunctivae normal.   Cardiovascular:     Rate and Rhythm: Normal rate and regular rhythm.     Heart sounds: Normal heart sounds.  Pulmonary:     Effort: Pulmonary effort is normal. No respiratory distress.     Breath sounds: No wheezing.  Abdominal:     General: There is no distension.  Musculoskeletal:     Cervical back: Neck supple.  Skin:    General: Skin is warm.     Capillary Refill: Capillary refill takes less than 2 seconds.     Findings: No rash.  Neurological:     Mental Status: She is alert and oriented to person, place, and time.     Motor: No abnormal muscle tone.  Psychiatric:        Attention and Perception: She is inattentive.        Thought Content: Thought content is paranoid.        Cognition and Memory: Cognition is impaired.        Judgment: Judgment is impulsive and inappropriate.      ____________________________________________   LABS (all labs ordered are listed, but only abnormal results are displayed)  Labs Reviewed  COMPREHENSIVE METABOLIC PANEL - Abnormal; Notable for the following components:      Result Value   Calcium 8.8 (*)    All other components within normal limits  CBC - Abnormal; Notable for the following components:   WBC 13.4 (*)    RBC 5.20 (*)    Hemoglobin 15.1 (*)    All other components within normal limits  RESP PANEL BY RT-PCR (FLU A&B, COVID) ARPGX2  ETHANOL  SALICYLATE LEVEL  ACETAMINOPHEN LEVEL  URINE DRUG SCREEN, QUALITATIVE ([facility] ONLY)  POC URINE PREG, ED  POC URINE PREG, ED    ____________________________________________  EKG:  ________________________________________  RADIOLOGY All imaging, including plain films, CT scans, and ultrasounds, independently reviewed by me, and interpretations confirmed via formal radiology reads.  ED MD interpretation:     Official radiology report(s): No results found.  ____________________________________________  PROCEDURES   Procedure(s) performed (including Critical  Care):  Procedures  ____________________________________________  INITIAL IMPRESSION / MDM / [place] / ED COURSE  As part of my medical decision making, I reviewed the following data within the [place] notes reviewed and incorporated, Old chart reviewed, Notes from prior ED visits, and [place] Controlled Substance Database       *Janice Walls was evaluated in Emergency Department on  03/22/2021 for the symptoms described in the history of present illness. She was evaluated in the context of the global COVID-19 pandemic, which necessitated consideration that the patient might be at risk for infection with the SARS-CoV-2 virus that causes COVID-19. Institutional protocols and algorithms that pertain to the evaluation of patients at risk for COVID-19 are in a state of rapid change based on information released by regulatory bodies including the CDC and federal and state organizations. These policies and algorithms were followed during the patient's care in the ED.  Some ED evaluations and interventions may be delayed as a result of limited staffing during the pandemic.*     Medical Decision Making:  35 yo F here with paranoia, psychosis. Likely 2/2 med nonadherence though pt also ahs a h/o drug use. She arrives under IVC. This was completed, will consult tts/psych for admission/evaluation.  Labs reassuring. Mild reactive leukocytosis noted - no fever, no infectious sx. COVID negative. CMP unremarkable. No apparent emergent medical condition.  ____________________________________________  FINAL CLINICAL IMPRESSION(S) / ED DIAGNOSES  Final diagnoses:  Paranoid schizophrenia ([place])     MEDICATIONS GIVEN DURING THIS VISIT:  Medications  OLANZapine zydis (ZYPREXA) disintegrating tablet 10 mg (has no administration in time range)     ED Discharge Orders     None        Note:  This document was prepared using Dragon voice recognition software  and may include unintentional dictation errors.   Duffy Bruce, MD 03/22/21 2002

## 2021-03-22 NOTE — ED Notes (Signed)
Phlebotomy contacted to collect lab orders.

## 2021-03-22 NOTE — ED Notes (Signed)
Phlebotomy unable to collect labs. Per staff, will send another phlebotomist.

## 2021-03-22 NOTE — ED Notes (Signed)
IVC 

## 2021-03-22 NOTE — ED Notes (Signed)
ED Provider at bedside. 

## 2021-03-22 NOTE — BH Assessment (Signed)
Referral information for Psychiatric Hospitalization faxed to;   [facility] [phone]),   [facility] [phone]),  [address] [phone]),   [facility] [phone] -or- [phone]),   [facility] [phone])

## 2021-03-22 NOTE — BH Assessment (Signed)
Comprehensive Clinical Assessment (CCA) Note  03/22/2021 Janice Walls [id number]  Chief Complaint: Patient is a 35 year old female presenting [facility] ED under IVC. Per triage note Pt comes into the ED via [facility] Deputy under IVC.  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  Pt also explained that she believes she is the devil and she is dating God.  Pt now in triage stating "Someone created a robot and I had sex with it".  Pt has h/o schizophrenia and does have an ACT team in place but patient hasnt been taking medications. During assessment patient appears alert and oriented x2, patient knows who she is and where she is but does not understand the situation. 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 Patient reports that she does not know what medications she takes and has a history of Meth use. Patient denies using any substances today.   Per Psyc NP Ysidro Evert patient is recommended for Inpatient Hospitalization  Chief Complaint  Patient presents with   Psychiatric Evaluation   Visit Diagnosis: Bipolar Disorder    CCA Screening, Triage and Referral (STR)  Patient Reported Information How did you hear about [place]? Legal System  Referral name: No data recorded Referral phone number: No data recorded  Whom do you see for routine medical problems? No data recorded Practice/Facility Name: No data recorded Practice/Facility Phone Number: No data recorded Name of Contact: No data recorded Contact Number: No data recorded Contact Fax Number: No data recorded Prescriber  Name: No data recorded Prescriber Address (if known): No data recorded  What Is the Reason for Your Visit/Call Today? Patient presents under IVC due to non compliance with medications and delusions  How Long Has This Been Causing You Problems? > than 6 months  What Do You Feel Would Help You the Most Today? No data recorded  Have You Recently Been in Any Inpatient Treatment (Hospital/Detox/Crisis Center/28-Day Program)? No data recorded Name/Location of Program/Hospital:No data recorded How Long Were You There? No data recorded When Were You Discharged? No data recorded  Have You Ever Received Services From [facility] Before? No data recorded Who Do You See at [facility]? No data recorded  Have You Recently Had Any Thoughts About Hurting Yourself? No  Are You Planning to Commit Suicide/Harm Yourself At This time? No   Have you Recently Had Thoughts About [place]? No  Explanation: No data recorded  Have You Used Any Alcohol or Drugs in the Past 24 Hours? No  How Long Ago Did You Use Drugs or Alcohol? No data recorded What Did You Use and How Much? No data recorded  Do You Currently Have a Therapist/Psychiatrist? Yes  Name of Therapist/Psychiatrist: ACT team   Have You Been Recently Discharged From Any Office Practice or Programs? No  Explanation of Discharge From Practice/Program: No data recorded    CCA Screening Triage Referral Assessment Type of Contact: Face-to-Face  Is this Initial or Reassessment? No data recorded Date Telepsych consult ordered in [facility]:  No data recorded Time  Telepsych consult ordered in [facility]:  No data recorded  Patient Reported Information Reviewed? No data recorded Patient Left Without Being Seen? No data recorded Reason for Not Completing Assessment: No data recorded  Collateral Involvement: No data recorded  Does Patient Have a [place]droy? No data recorded Name and Contact of Legal Guardian: No data  recorded If Minor and Not Living with Parent(s), Who has Custody? No data recorded Is CPS involved or ever been involved? Never  Is APS involved or ever been involved? Never   Patient Determined To Be At Risk for Harm To Self or Others Based on Review of Patient Reported Information or Presenting Complaint? No  Method: No data recorded Availability of Means: No data recorded Intent: No data recorded Notification Required: No data recorded Additional Information for Danger to Others Potential: No data recorded Additional Comments for Danger to Others Potential: No data recorded Are There Guns or Other Weapons in Your Home? No data recorded Types of Guns/Weapons: No data recorded Are These Weapons Safely Secured?                            No data recorded Who Could Verify You Are Able To Have These Secured: No data recorded Do You Have any Outstanding Charges, Pending Court Dates, Parole/Probation? No data recorded Contacted To Inform of Risk of Harm To Self or Others: No data recorded  Location of Assessment: [facility]   Does Patient Present under Involuntary Commitment? Yes  IVC Papers Initial File Date: 03/22/21   [place] of Residence: [place]   Patient Currently Receiving the Following Services: ACTT Architect)   Determination of Need: Emergent (2 hours)   Options For Referral: No data recorded    CCA Biopsychosocial Intake/Chief Complaint:  No data recorded Current Symptoms/Problems: No data recorded  Patient Reported Schizophrenia/Schizoaffective Diagnosis in Past: No   Strengths: Patient is able to communicate  Preferences: No data recorded Abilities: No data recorded  Type of Services Patient Feels are Needed: No data recorded  Initial Clinical Notes/Concerns: No data recorded  Mental Health Symptoms Depression:   None   Duration of Depressive symptoms: No data recorded  Mania:   Change in energy/activity; Increased Energy;  Irritability; Racing thoughts   Anxiety:    Difficulty concentrating; Irritability; Restlessness; Worrying   Psychosis:   Delusions   Duration of Psychotic symptoms:  Greater than six months   Trauma:   None   Obsessions:   None   Compulsions:   Absent insight/delusional   Inattention:   None   Hyperactivity/Impulsivity:   None   Oppositional/Defiant Behaviors:   None   Emotional Irregularity:   None   Other Mood/Personality Symptoms:  No data recorded   Mental Status Exam Appearance and self-care  Stature:   Average   Weight:   Overweight   Clothing:   Casual   Grooming:   Normal   Cosmetic use:   None   Posture/gait:   Normal   Motor activity:   Not Remarkable   Sensorium  Attention:   Normal   Concentration:   Normal   Orientation:   X5   Recall/memory:   Normal   Affect and Mood  Affect:   Blunted   Mood:   Irritable   Relating  Eye contact:   Normal   Facial expression:   Responsive   Attitude toward examiner:   Cooperative   Thought and Language  Speech flow:  Clear and Coherent   Thought content:   Delusions   Preoccupation:   Ruminations   Hallucinations:   None   Organization:  No data recorded  [employer] of Knowledge:   Fair   Intelligence:   Average   Abstraction:   Functional   Judgement:   Impaired   Reality Testing:   Distorted   Insight:   Lacking   Decision Making:   Impulsive   Social Functioning  Social Maturity:   Isolates   Social Judgement:   Normal   Stress  Stressors:   Other (Comment)   Coping Ability:   Exhausted   Skill Deficits:   None   Supports:   Friends/Service system     Religion: Religion/Spirituality Are You A Religious Person?: No  Leisure/Recreation: Leisure / Recreation Do You Have Hobbies?: No  Exercise/Diet: Exercise/Diet Do You Exercise?: No Have You Gained or Lost A Significant Amount of Weight in the Past  Six Months?: No Do You Follow a Special Diet?: No Do You Have Any Trouble Sleeping?: No   CCA Employment/Education Employment/Work Situation: Employment / Work Situation Employment Situation: Unemployed Has Patient ever Been in Passenger transport manager?: No  Education: Education Is Patient Currently Attending School?: No Did You Have An Individualized Education Program (IIEP): No Did You Have Any Difficulty At [employer]?: No Patient's Education Has Been Impacted by Current Illness: No   CCA Family/Childhood History Family and Relationship History: Family history Marital status: Single Does patient have children?: Yes How many children?: 2 How is patient's relationship with their children?: Patient reports her children live with her mother  Childhood History:  Childhood History By whom was/is the patient raised?: Mother Did patient suffer any verbal/emotional/physical/sexual abuse as a child?: No Did patient suffer from severe childhood neglect?: No Has patient ever been sexually abused/assaulted/raped as an adolescent or adult?: No Was the patient ever a victim of a crime or a disaster?: No Witnessed domestic violence?: No Has patient been affected by domestic violence as an adult?: No  Child/Adolescent Assessment:     CCA Substance Use Alcohol/Drug Use: Alcohol / Drug Use Pain Medications: See MAR Prescriptions: See MAR Over the Counter: See MAR History of alcohol / drug use?: Yes Substance #1 Name of Substance 1: History of Methamphetamine Use                       ASAM's:  Six Dimensions of Multidimensional Assessment  Dimension 1:  Acute Intoxication and/or Withdrawal Potential:      Dimension 2:  Biomedical Conditions and Complications:      Dimension 3:  Emotional, Behavioral, or Cognitive Conditions and Complications:     Dimension 4:  Readiness to Change:     Dimension 5:  Relapse, Continued use, or Continued Problem Potential:     Dimension 6:   Recovery/Living Environment:     ASAM Severity Score:    ASAM Recommended Level of Treatment:     Substance use Disorder (SUD)    Recommendations for Services/Supports/Treatments:  Inpatient  DSM5 Diagnoses: Patient Active Problem List   Diagnosis Date Noted   Methamphetamine use ([place]) 05/10/2019   Nonspecific chest pain    Pyelonephritis 04/05/2017   Bipolar I disorder, most recent episode (or current) manic ([place]) 04/05/2017   Sepsis due to gram-negative UTI ([place]) 04/04/2017   Polysubstance abuse ([place]) 04/04/2017   Hepatitis C 09/18/2016   Transaminitis 09/18/2016   Bipolar affective disorder, current episode manic (  HCC) 09/12/2016   Tobacco use disorder 09/12/2016   Cannabis use disorder, severe, dependence (HCC) 09/12/2016    Patient Centered Plan: Patient is on the following Treatment Plan(s):  Impulse Control   Referrals to Alternative Service(s): Referred to Alternative Service(s):   Place:   Date:   Time:    Referred to Alternative Service(s):   Place:   Date:   Time:    Referred to Alternative Service(s):   Place:   Date:   Time:    Referred to Alternative Service(s):   Place:   Date:   Time:     Chipper Koudelka A Kassia Demarinis, LCAS-A
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
## 2021-03-22 NOTE — ED Notes (Addendum)
Patient Belongings:  Manson Passey, white, and coral shirt Blue jeans Black boots [employer] phone White phone Consulting civil engineer Cigarettes White bracelet Black hair tie
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
## 2021-03-23 DIAGNOSIS — F3111 Bipolar disorder, current episode manic without psychotic features, mild: Secondary | ICD-10-CM

## 2021-03-23 LAB — URINE DRUG SCREEN, QUALITATIVE (ARMC ONLY)
Amphetamines, Ur Screen: POSITIVE — AB
Barbiturates, Ur Screen: NOT DETECTED
Benzodiazepine, Ur Scrn: NOT DETECTED
Cannabinoid 50 Ng, Ur ~~LOC~~: POSITIVE — AB
Cocaine Metabolite,Ur ~~LOC~~: NOT DETECTED
MDMA (Ecstasy)Ur Screen: NOT DETECTED
Methadone Scn, Ur: NOT DETECTED
Opiate, Ur Screen: NOT DETECTED
Phencyclidine (PCP) Ur S: NOT DETECTED
Tricyclic, Ur Screen: NOT DETECTED

## 2021-03-23 LAB — POC URINE PREG, ED: Preg Test, Ur: NEGATIVE

## 2021-03-23 NOTE — ED Provider Notes (Signed)
Emergency Medicine Observation Re-evaluation Note  Janice Walls is a 35 y.o. female, seen on rounds today.  Pt initially presented to the ED for complaints of Psychiatric Evaluation Currently, the patient is resting, voices no medical complaints.  Physical Exam  BP 111/87 (BP Location: Left Arm)   Pulse (!) 101   Temp 98 F (36.7 C) (Oral)   Resp 20   Ht 5\' 2"  (1.575 m)   Wt 63.5 kg   SpO2 98%   BMI 25.60 kg/m  Physical Exam General: Resting in no acute distress Cardiac: No cyanosis Lungs: Equal rise and fall Psych: Not agitated  ED Course / MDM  EKG:   I have reviewed the labs performed to date as well as medications administered while in observation.  Recent changes in the last 24 hours include no events overnight.  Plan  Current plan is for psychiatric disposition. Janice Walls is under involuntary commitment.      Janice Durand, MD 03/23/21 [phone]

## 2021-03-23 NOTE — BH Assessment (Signed)
PATIENT BED AVAILABLE AFTER 9AM for 03/23/21  Patient has been accepted to [facility].  Patient assigned to [facility] C-Unit Accepting physician is Dr. Sallyanne Kuster.  Call report to [phone].  Representative was [employer].   ER Staff is aware of it:  [facility] ER Secretary  Dr. Dolores Frame, ER MD  Sue Lush Patient's Nurse

## 2021-03-23 NOTE — ED Notes (Signed)
Pt up to use bathroom 

## 2021-03-23 NOTE — ED Notes (Signed)
Pt out of restroom. States she forgot to urinate into the cup. Hands cup back to ED tech and returns to exam room.

## 2021-03-23 NOTE — ED Notes (Signed)
Pt up to restroom with steady gait. Pt stopped and given labeled sterile cup for urine collection and given instructions on how to collect urine. Verbalized understanding.

## 2021-03-23 NOTE — ED Notes (Signed)
Pt given meal tray.

## 2021-03-23 NOTE — ED Notes (Addendum)
[facility] fax [phone] POC and UDS result once received. Pending acceptance.

## 2021-03-23 NOTE — ED Notes (Signed)
Pt asleep- will wait for vitals

## 2021-12-14 ENCOUNTER — Other Ambulatory Visit: Payer: Self-pay

## 2021-12-14 ENCOUNTER — Emergency Department
Admission: EM | Admit: 2021-12-14 | Discharge: 2021-12-14 | Discharge status: Against Medical Advice | Payer: Medicaid Other | Attending: Emergency Medicine | Admitting: Emergency Medicine

## 2021-12-14 ENCOUNTER — Encounter: Payer: Self-pay | Admitting: Emergency Medicine

## 2021-12-14 DIAGNOSIS — R519 Headache, unspecified: Secondary | ICD-10-CM | POA: Diagnosis present

## 2021-12-14 DIAGNOSIS — Z5329 Procedure and treatment not carried out because of patient's decision for other reasons: Secondary | ICD-10-CM | POA: Insufficient documentation

## 2021-12-14 MED ORDER — BUTALBITAL-APAP-CAFFEINE 50-325-40 MG PO TABS
1.0000 | ORAL_TABLET | Freq: Once | ORAL | Status: AC
  Administered 2021-12-14: 1 via ORAL
  Filled 2021-12-14: qty 1

## 2021-12-14 NOTE — ED Notes (Signed)
Patient decided she wanted to leave. Patient got up and walked out. Step father followed.  EDP Dr. Derrill Kay aware.  Writer advised patient she should stay and see psych provided patient states, "I am fine I just need some sleep at home."
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
## 2021-12-14 NOTE — ED Triage Notes (Signed)
First Nurse Note:   Pt via EMS from home. Pt c/o headache for 2 weeks. Pt has been taking OTC Motrin and it has helped. Denies any other complaints. Pt is A&Ox4 and NAD  129/71 98% on RA 99 HR

## 2021-12-14 NOTE — ED Provider Notes (Signed)
   [facility] Provider Note    Event Date/Time   First MD Initiated Contact with Patient 12/14/21 1545     (approximate)   History   Headache   HPI  Janice Walls is a 36 y.o. female  who presents to the emergency department today because of concern for headache. The patient states that she has had a headache for a long time. The patient states that in the past she has had a procedure and feels she needs to have another procedure to get something out of her head.    Physical Exam   Triage Vital Signs: ED Triage Vitals [12/14/21 1507]  Enc Vitals Group     BP (!) 122/90     Pulse Rate 92     Resp 16     Temp 98.8 F (37.1 C)     Temp Source Oral     SpO2 95 %     Weight 185 lb (83.9 kg)     Height 5\' 2"  (1.575 m)     Head Circumference      Peak Flow      Pain Score 5     Pain Loc      Pain Edu?      Excl. in GC?     Most recent vital signs: Vitals:   12/14/21 1507  BP: (!) 122/90  Pulse: 92  Resp: 16  Temp: 98.8 F (37.1 C)  SpO2: 95%   General: Awake, alert, oriented. CV:  Good peripheral perfusion. Regular rate and rhythm. Resp:  Normal effort.  Abd:  No distention.  Other:  Not responding to internal stimuli. No SI/HI.   ED Results / Procedures / Treatments   Labs (all labs ordered are listed, but only abnormal results are displayed) Labs Reviewed - No data to display   EKG  None   RADIOLOGY None   PROCEDURES:  Critical Care performed: No  Procedures   MEDICATIONS ORDERED IN ED: Medications - No data to display   IMPRESSION / MDM / ASSESSMENT AND PLAN / ED COURSE  I reviewed the triage vital signs and the nursing notes.                              Differential diagnosis includes, but is not limited to, headache, mental illness.  Patient's presentation is most consistent with acute presentation with potential threat to life or bodily function.  Patient presented to the emergency department today  with primary concerns for headache.  States this has been going on for quite some time.  Mother also had concerns about mental illness.  She says patient has acting but has been acting more bizarre recently.  On my exam patient does not appear to be responding to any internal stimuli.  No SI HI.  I did offer to have psychiatry come evaluate the patient and the order was placed.  However prior to psychiatric evaluation the patient chose to leave the emergency department.  At this time I do not feel patient meets IVC requirements.   FINAL CLINICAL IMPRESSION(S) / ED DIAGNOSES   Final diagnoses:  Bad headache      Note:  This document was prepared using Dragon voice recognition software and may include unintentional dictation errors.    12/16/21, MD 12/14/21 2108

## 2022-08-04 ENCOUNTER — Other Ambulatory Visit: Payer: Self-pay

## 2022-08-04 ENCOUNTER — Emergency Department
Admission: EM | Admit: 2022-08-04 | Discharge: 2022-08-05 | Disposition: A | Discharge status: Other Acute Inpatient Hospital | Payer: No Typology Code available for payment source | Source: Home / Self Care | Attending: Emergency Medicine | Admitting: Emergency Medicine

## 2022-08-04 DIAGNOSIS — B1920 Unspecified viral hepatitis C without hepatic coma: Secondary | ICD-10-CM | POA: Insufficient documentation

## 2022-08-04 DIAGNOSIS — Z79899 Other long term (current) drug therapy: Secondary | ICD-10-CM | POA: Insufficient documentation

## 2022-08-04 DIAGNOSIS — F1910 Other psychoactive substance abuse, uncomplicated: Secondary | ICD-10-CM | POA: Insufficient documentation

## 2022-08-04 DIAGNOSIS — F1590 Other stimulant use, unspecified, uncomplicated: Secondary | ICD-10-CM | POA: Insufficient documentation

## 2022-08-04 DIAGNOSIS — F23 Brief psychotic disorder: Secondary | ICD-10-CM

## 2022-08-04 DIAGNOSIS — Z20822 Contact with and (suspected) exposure to covid-19: Secondary | ICD-10-CM | POA: Insufficient documentation

## 2022-08-04 DIAGNOSIS — F29 Unspecified psychosis not due to a substance or known physiological condition: Secondary | ICD-10-CM | POA: Insufficient documentation

## 2022-08-04 DIAGNOSIS — F3110 Bipolar disorder, current episode manic without psychotic features, unspecified: Secondary | ICD-10-CM | POA: Insufficient documentation

## 2022-08-04 DIAGNOSIS — F1220 Cannabis dependence, uncomplicated: Secondary | ICD-10-CM | POA: Insufficient documentation

## 2022-08-04 LAB — URINE DRUG SCREEN, QUALITATIVE (ARMC ONLY)
Amphetamines, Ur Screen: NOT DETECTED
Barbiturates, Ur Screen: NOT DETECTED
Benzodiazepine, Ur Scrn: NOT DETECTED
Cannabinoid 50 Ng, Ur ~~LOC~~: NOT DETECTED
Cocaine Metabolite,Ur ~~LOC~~: NOT DETECTED
MDMA (Ecstasy)Ur Screen: NOT DETECTED
Methadone Scn, Ur: NOT DETECTED
Opiate, Ur Screen: NOT DETECTED
Phencyclidine (PCP) Ur S: NOT DETECTED
Tricyclic, Ur Screen: NOT DETECTED

## 2022-08-04 LAB — COMPREHENSIVE METABOLIC PANEL
ALT: 31 U/L (ref 0–44)
AST: 23 U/L (ref 15–41)
Albumin: 3.9 g/dL (ref 3.5–5.0)
Alkaline Phosphatase: 68 U/L (ref 38–126)
Anion gap: 13 (ref 5–15)
BUN: 9 mg/dL (ref 6–20)
CO2: 16 mmol/L — ABNORMAL LOW (ref 22–32)
Calcium: 8.8 mg/dL — ABNORMAL LOW (ref 8.9–10.3)
Chloride: 108 mmol/L (ref 98–111)
Creatinine, Ser: 0.67 mg/dL (ref 0.44–1.00)
GFR, Estimated: >60 mL/min (ref >60)
Glucose, Bld: 95 mg/dL (ref 70–99)
Potassium: 3.5 mmol/L (ref 3.5–5.1)
Sodium: 137 mmol/L (ref 135–145)
Total Bilirubin: 0.6 mg/dL (ref 0.3–1.2)
Total Protein: 7.4 g/dL (ref 6.5–8.1)

## 2022-08-04 LAB — ACETAMINOPHEN LEVEL: Acetaminophen (Tylenol), Serum: <10 ug/mL — ABNORMAL LOW (ref 10–30)

## 2022-08-04 LAB — CBC
HCT: 46.8 % — ABNORMAL HIGH (ref 36.0–46.0)
Hemoglobin: 15 g/dL (ref 12.0–15.0)
MCH: 28.9 pg (ref 26.0–34.0)
MCHC: 32.1 g/dL (ref 30.0–36.0)
MCV: 90.2 fL (ref 80.0–100.0)
Platelets: 288 10*3/uL (ref 150–400)
RBC: 5.19 MIL/uL — ABNORMAL HIGH (ref 3.87–5.11)
RDW: 13.6 % (ref 11.5–15.5)
WBC: 11.5 10*3/uL — ABNORMAL HIGH (ref 4.0–10.5)
nRBC: 0 % (ref 0.0–0.2)

## 2022-08-04 LAB — ETHANOL: Alcohol, Ethyl (B): <10 mg/dL (ref <10)

## 2022-08-04 LAB — PREGNANCY, URINE: Preg Test, Ur: NEGATIVE

## 2022-08-04 LAB — SALICYLATE LEVEL: Salicylate Lvl: <7 mg/dL — ABNORMAL LOW (ref 7.0–30.0)

## 2022-08-04 LAB — SARS CORONAVIRUS 2 BY RT PCR: SARS Coronavirus 2 by RT PCR: NEGATIVE

## 2022-08-04 MED ORDER — IBUPROFEN 600 MG PO TABS: 600.0000 mg | ORAL_TABLET | Freq: Three times a day (TID) | ORAL | Status: DC | PRN

## 2022-08-04 MED ORDER — ALUM & MAG HYDROXIDE-SIMETH 200-200-20 MG/5ML PO SUSP: 30.0000 mL | Freq: Four times a day (QID) | ORAL | Status: DC | PRN

## 2022-08-04 MED ORDER — ONDANSETRON HCL 4 MG PO TABS: 4.0000 mg | ORAL_TABLET | Freq: Three times a day (TID) | ORAL | Status: DC | PRN

## 2022-08-04 NOTE — ED Notes (Signed)
Pt given dinner tray.

## 2022-08-04 NOTE — ED Notes (Signed)
Patient is aware that her mother and stepfather were here and will be back tomorrow. Patient handled the info calmly.

## 2022-08-04 NOTE — BH Assessment (Addendum)
Comprehensive Clinical Assessment (CCA) Note  08/04/2022 Janice Walls [id number] Recommendations for Services/Supports/Treatments: Psych NP Janice Walls. determined pt. meets psychiatric inpatient criteria.  Janice Walls is a 37 year old, English speaking, Caucasian female with a hx of Bipolar Affective disorder, Schizoaffective disorder, and polysubstance abuse. Pt presented to [facility] ED under IVC from [facility] due to concerns about pt. presenting with bizarre behavior. Upon assessment, pt. endorsed feeling depressed and was able to identify stressors that included having a lack of friends, hobbies, and feelings of loneliness. Pt rated her depression at a 6/7 on a scale of 1-10 with 1 being lowest and ten being highest. Pt reported that she lives alone and struggles with isolative behaviors and has a decreased appetite. Pt also reported having racing, intrusive thoughts. Pt had some insight into why she'd presented to the hospital, explaining she'd done 2 things but refused to provide details. The pt. had nonsensical speech and gaps in judgement. Pt appeared to be responding to internal or external stimuli; however, pt. denied hallucinating. Pt was hyper religious and eluded to having repeated mental acts that involved ridding people of evil spirits. Pt presented with circumstantial, loose speech. Thoughts were disorganized. Pt presented with a labile mood; affect was congruent. Pt became tearful when discussing her worries about her family or feelings of loneliness. Pt denied current SI/HI or AV/H. BAL/UDS unremarkable.   Chief Complaint:  Chief Complaint  Patient presents with   IVC   Visit Diagnosis: Bipolar affective disorder, current episode manic (HCC)   Tobacco use disorder   Cannabis use disorder, severe, dependence (HCC)   Hepatitis C   Polysubstance abuse (HCC)   Bipolar I disorder, most recent episode (or current) manic (HCC)   Methamphetamine use (HCC)    CCA Screening, Triage and  Referral (STR)  Patient Reported Information How did you hear about [place]? 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 No data recorded Practice/Facility Name: No data recorded Practice/Facility Phone Number: No data recorded Name of Contact: No data recorded Contact Number: No data recorded Contact Fax Number: No data recorded Prescriber Name: No data recorded Prescriber Address (if known): No data recorded  What Is the Reason for Your Visit/Call Today? Pt comes via Automotive engineer with IVC. Pt states her mom called Police. Pt was picked up by officers and was taken to [facility]. Pt BP was elevated so pt was brought here.  How Long Has This Been Causing You Problems? > than 6 months  What Do You Feel Would Help You the Most Today? Treatment for Depression or other mood problem; Stress Management   Have You Recently Been in Any Inpatient Treatment (Hospital/Detox/Crisis Center/28-Day Program)? No data recorded Name/Location of Program/Hospital:No data recorded How Long Were You There? No data recorded When Were You Discharged? No data recorded  Have You Ever Received Services From [facility] Before? No data recorded Who Do You See at [facility]? No data recorded  Have You Recently Had Any Thoughts About Hurting Yourself? No  Are You Planning to Commit Suicide/Harm Yourself At This time? No   Have you Recently Had Thoughts About Hurting Someone Janice Walls? No  Explanation: No data recorded  Have You Used Any Alcohol or Drugs in the Past 24 Hours? No  How Long Ago Did You Use Drugs or Alcohol? No data recorded What Did You Use and How Much? n/a   Do You Currently  Have a Therapist/Psychiatrist? Yes  Name of Therapist/Psychiatrist: Unable to recall   Have You Been Recently Discharged From Any Office Practice or Programs? No  Explanation of Discharge From  Practice/Program: n/a     CCA Screening Triage Referral Assessment Type of Contact: Face-to-Face  Is this Initial or Reassessment? No data recorded Date Telepsych consult ordered in [facility]:  No data recorded Time Telepsych consult ordered in [facility]:  No data recorded  Patient Reported Information Reviewed? No data recorded Patient Left Without Being Seen? No data recorded Reason for Not Completing Assessment: No data recorded  Collateral Involvement: Janice Walls   Does Patient Have a Automotive engineerCourt Appointed Legal Guardian? No data recorded Name and Contact of Legal Guardian: No data recorded If Minor and Not Living with Parent(s), Who has Custody? n/a  Is CPS involved or ever been involved? Never  Is APS involved or ever been involved? Never   Patient Determined To Be At Risk for Harm To Self or Others Based on Review of Patient Reported Information or Presenting Complaint? Yes, for Self-Harm  Method: No Plan  Availability of Means: No access or NA  Intent: Vague intent or NA  Notification Required: No need or identified person  Additional Information for Danger to Others Potential: Active psychosis  Additional Comments for Danger to Others Potential: n/a  Are There Guns or Other Weapons in Your Home? No  Types of Guns/Weapons: n/a  Are These Weapons Safely Secured?                            -- (n/a)  Who Could Verify You Are Able To Have These Secured: n/a  Do You Have any Outstanding Charges, Pending Court Dates, Parole/Probation? None  Contacted To Inform of Risk of Harm To Self or Others: Guardian/MH POA:   [facility]   Does Patient Present under Involuntary Commitment? Yes  IVC Papers Initial File Date: No data recorded  [place]   Patient Currently Receiving the Following Services: Medication Management   Determination of Need: Emergent (2 hours)   Options For Referral: Inpatient Hospitalization     CCA  Biopsychosocial Intake/Chief Complaint:  No data recorded Current Symptoms/Problems: No data recorded  Patient Reported Schizophrenia/Schizoaffective Diagnosis in Past: Yes   Strengths: Pt has secure housing, pt has a supportive family, pt is receptive to recieving treatment  Preferences: No data recorded Abilities: No data recorded  Type of Services Patient Feels are Needed: No data recorded  Initial Clinical Notes/Concerns: No data recorded  Mental Health Symptoms Depression:   Increase/decrease in appetite; Tearfulness   Duration of Depressive symptoms:  Greater than two weeks   Mania:   Change in energy/activity; Increased Energy; Irritability; Racing thoughts; Recklessness   Anxiety:    Difficulty concentrating; Irritability; Restlessness; Worrying   Psychosis:   Delusions; Hallucinations   Duration of Psychotic symptoms:  Greater than six months   Trauma:   None   Obsessions:   None   Compulsions:   Absent insight/delusional; "Driven" to perform behaviors/acts; Disrupts with routine/functioning; Intrusive/time consuming; Repeated behaviors/mental acts   Inattention:   None   Hyperactivity/Impulsivity:   None   Oppositional/Defiant Behaviors:   None   Emotional Irregularity:   Mood lability   Other Mood/Personality Symptoms:  No data recorded   Mental Status Exam Appearance and self-care  Stature:   Average   Weight:   Overweight   Clothing:   Casual  Grooming:   Normal   Cosmetic use:   None   Posture/gait:   Normal   Motor activity:   Not Remarkable   Sensorium  Attention:   Normal   Concentration:   Focuses on irrelevancies; Anxiety interferes   Orientation:   X5   Recall/memory:   Normal   Affect and Mood  Affect:   Labile   Mood:   Anxious; Depressed   Relating  Eye contact:   Normal   Facial expression:   Responsive   Attitude toward examiner:   Cooperative   Thought and Language  Speech flow:   Flight of Ideas   Thought content:   Delusions   Preoccupation:   Ruminations   Hallucinations:   Visual   Organization:  No data recorded  Company secretary of Knowledge:   Fair   Intelligence:   Average   Abstraction:   Functional   Judgement:   Impaired   Reality Testing:   Distorted   Insight:   Present; Lacking   Decision Making:   Impulsive   Social Functioning  Social Maturity:   Isolates   Social Judgement:   Normal   Stress  Stressors:   Other (Comment)   Coping Ability:   Exhausted   Skill Deficits:   Decision making   Supports:   Family; Support needed     Religion: Religion/Spirituality Are You A Religious Person?: Yes What is Your Religious Affiliation?: Christian  Leisure/Recreation: Leisure / Recreation Do You Have Hobbies?: No  Exercise/Diet: Exercise/Diet Do You Exercise?: No Have You Gained or Lost A Significant Amount of Weight in the Past Six Months?: No Do You Follow a Special Diet?: No Do You Have Any Trouble Sleeping?: Yes Explanation of Sleeping Difficulties: Pt reported having sleep disturbance.   CCA Employment/Education Employment/Work Situation: Employment / Work Systems developer: On disability Why is Patient on Disability: Mental Health How Long has Patient Been on Disability: UTA Patient's Job has Been Impacted by Current Illness: No  Education: Education Is Patient Currently Attending School?: No Did Theme park manager?: No Did You Have An Individualized Education Program (IIEP): No Did You Have Any Difficulty At School?: No Patient's Education Has Been Impacted by Current Illness: No   CCA Family/Childhood History Family and Relationship History: Family history Marital status: Single Does patient have children?: Yes How many children?: 2 How is patient's relationship with their children?: Patient reports her children live with her mother  Childhood History:   Childhood History By whom was/is the patient raised?: Mother Did patient suffer any verbal/emotional/physical/sexual abuse as a child?: No Did patient suffer from severe childhood neglect?: No Has patient ever been sexually abused/assaulted/raped as an adolescent or adult?: No Was the patient ever a victim of a crime or a disaster?: No Witnessed domestic violence?: No Has patient been affected by domestic violence as an adult?: No  Child/Adolescent Assessment:     CCA Substance Use Alcohol/Drug Use: Alcohol / Drug Use Pain Medications: See MAR Prescriptions: See MAR Over the Counter: See MAR History of alcohol / drug use?: Yes Longest period of sobriety (when/how long): Unknown Negative Consequences of Use: Personal relationships Withdrawal Symptoms:  (n/a)                         ASAM's:  Six Dimensions of Multidimensional Assessment  Dimension 1:  Acute Intoxication and/or Withdrawal Potential:   Dimension 1:  Description of individual's past and current experiences of  substance use and withdrawal: Pt has a hx of polysubstance abuse  Dimension 2:  Biomedical Conditions and Complications:   Dimension 2:  Description of patient's biomedical conditions and  complications: n/a  Dimension 3:  Emotional, Behavioral, or Cognitive Conditions and Complications:  Dimension 3:  Description of emotional, behavioral, or cognitive conditions and complications: Pt has a hx of schizoaffective disorder  Dimension 4:  Readiness to Change:     Dimension 5:  Relapse, Continued use, or Continued Problem Potential:     Dimension 6:  Recovery/Living Environment:     ASAM Severity Score: ASAM's Severity Rating Score: 8  ASAM Recommended Level of Treatment: ASAM Recommended Level of Treatment:  (n/a)   Substance use Disorder (SUD) Substance Use Disorder (SUD)  Checklist Symptoms of Substance Use:  (n/a)  Recommendations for Services/Supports/Treatments: Recommendations for  Services/Supports/Treatments Recommendations For Services/Supports/Treatments:  (n/a)  DSM5 Diagnoses: Patient Active Problem List   Diagnosis Date Noted   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   Nonspecific chest pain    Pyelonephritis 04/05/2017   Bipolar I disorder, most recent episode (or current) manic 04/05/2017   Sepsis due to gram-negative UTI 04/04/2017   Polysubstance abuse 04/04/2017   Hepatitis C 09/18/2016   Transaminitis 09/18/2016   Bipolar affective disorder, current episode manic 09/12/2016   Tobacco use disorder 09/12/2016   Cannabis use disorder, severe, dependence 09/12/2016   Ronda Kazmi R Orman Matsumura, LCAS

## 2022-08-04 NOTE — ED Notes (Addendum)
Pt dressed out into hospital attire. Pt belongings to include: 1 cream pants 1 red underwear 1 yellow shirt 2 teal shoes 3 black hairtie 1 bible

## 2022-08-04 NOTE — BH Assessment (Signed)
Writer spoke with pt's mother Odessa Fleming (Legal Guardian) [phone] for collateral. Mother was concerned about pt's ability to remain safe due to pt's psychosis. Mother reported that the pt is in crisis. Mother explained that the pt's neighbors called to report that the pt walks down the street unclothed. Mother requested to be updated about the pt's plan of care.

## 2022-08-04 NOTE — ED Notes (Signed)
Pt asking about a implant for the back of her head to "help her think better." Pt also states she has a family history of cancer and wants to be "checked" for cancer. Pt informed of blood tests done thus far and that the ED does not provide preventative cancer screenings.

## 2022-08-04 NOTE — ED Notes (Signed)
Patient's mother was here and a copy was made of her guardianship papers.

## 2022-08-04 NOTE — ED Triage Notes (Addendum)
Pt comes via Automotive engineer with IVC. Pt states her mom called Police. Pt was picked up by officers and was taken to [facility]. Pt BP was elevated so pt was brought here.  Pt apparently had been calm and quiet per Police. Pt not making sense. Pt refusing temp. Pt states she was just reading the bible. Pt states a lot of evil things have been happening.   Pt denies any SI or HI. Pt denies being schizo and states she doesn't take medications. Pt denies any drug or alcohol use.   Pt states she tossed the chair off her porch so she could go outside and sit. Pt states she laid her blanket down.   Pt having moments of being calm paired with outbursts.

## 2022-08-04 NOTE — ED Notes (Signed)
Pt IVC prior to arrival/Consult ordered/Pending

## 2022-08-04 NOTE — ED Notes (Signed)
Pt given meal tray and drink. Pt is AOX4, NAD noted.

## 2022-08-04 NOTE — ED Provider Notes (Signed)
[facility] Provider Note    Event Date/Time   First MD Initiated Contact with Patient 08/04/22 1900     (approximate)   History   Chief Complaint: IVC   HPI  Janice Walls is a 37 y.o. female with a past history of bipolar disorder and substance abuse who is brought to the ED under involuntary commitment due to agitation, bizarre behavior at home.  Reportedly has been hyperreligious, paranoid and delusional.  Not taking any medications currently.  Patient denies any complaints or injuries.     Physical Exam   Triage Vital Signs: ED Triage Vitals  Enc Vitals Group     BP 08/04/22 1714 136/89     Pulse Rate 08/04/22 1714 (!) 107     Resp 08/04/22 1714 18     Temp 08/04/22 1714 98 F (36.7 C)     Temp src --      SpO2 08/04/22 1714 100 %     Weight --      Height --      Head Circumference --      Peak Flow --      Pain Score 08/04/22 1708 0     Pain Loc --      Pain Edu? --      Excl. in GC? --     Most recent vital signs: Vitals:   08/04/22 1714  BP: 136/89  Pulse: (!) 107  Resp: 18  Temp: 98 F (36.7 C)  SpO2: 100%    General: Awake, no distress. CV:  Good peripheral perfusion.  Resp:  Normal effort.  Abd:  No distention.  Other:  No wounds   ED Results / Procedures / Treatments   Labs (all labs ordered are listed, but only abnormal results are displayed) Labs Reviewed  COMPREHENSIVE METABOLIC PANEL - Abnormal; Notable for the following components:      Result Value   CO2 16 (*)    Calcium 8.8 (*)    All other components within normal limits  CBC - Abnormal; Notable for the following components:   WBC 11.5 (*)    RBC 5.19 (*)    HCT 46.8 (*)    All other components within normal limits  ACETAMINOPHEN LEVEL - Abnormal; Notable for the following components:   Acetaminophen (Tylenol), Serum <10 (*)    All other components within normal limits  SALICYLATE LEVEL - Abnormal; Notable for the following components:    Salicylate Lvl <7.0 (*)    All other components within normal limits  SARS CORONAVIRUS 2 BY RT PCR  URINE DRUG SCREEN, QUALITATIVE ([facility] ONLY)  ETHANOL  PREGNANCY, URINE     EKG    RADIOLOGY    PROCEDURES:  Procedures   MEDICATIONS ORDERED IN ED: Medications  ibuprofen (ADVIL) tablet 600 mg (has no administration in time range)  ondansetron (ZOFRAN) tablet 4 mg (has no administration in time range)  alum & mag hydroxide-simeth (MAALOX/MYLANTA) 200-200-20 MG/5ML suspension 30 mL (has no administration in time range)     IMPRESSION / MDM / ASSESSMENT AND PLAN / ED COURSE  I reviewed the triage vital signs and the nursing notes.  Patient's presentation is most consistent with severe exacerbation of chronic illness.  Patient presents with symptoms of acute psychosis.  Continue IVC pending psychiatry evaluation.  She is medically stable  The patient has been placed in psychiatric observation due to the need to provide a safe environment for the patient while obtaining psychiatric consultation and  evaluation, as well as ongoing medical and medication management to treat the patient's condition.  The patient has been placed under full IVC at this time.   ----------------------------------------- 9:06 PM on 08/04/2022 ----------------------------------------- Recommended for inpatient treatment by psychiatry.     FINAL CLINICAL IMPRESSION(S) / ED DIAGNOSES   Final diagnoses:  Acute psychosis     Rx / DC Orders   ED Discharge Orders     None        Note:  This document was prepared using Dragon voice recognition software and may include unintentional dictation errors.   Sharman CheekStafford, Yossef Gilkison, MD 08/04/22 2106

## 2022-08-05 ENCOUNTER — Encounter: Payer: Self-pay | Admitting: Psychiatry

## 2022-08-05 ENCOUNTER — Inpatient Hospital Stay
Admission: AD | Admit: 2022-08-05 | Discharge: 2022-08-22 | DRG: 885 | Disposition: A | Discharge status: Home / Self Care | Payer: No Typology Code available for payment source | Source: Intra-hospital | Attending: Psychiatry | Admitting: Psychiatry

## 2022-08-05 DIAGNOSIS — F17210 Nicotine dependence, cigarettes, uncomplicated: Secondary | ICD-10-CM | POA: Diagnosis present

## 2022-08-05 DIAGNOSIS — F3112 Bipolar disorder, current episode manic without psychotic features, moderate: Principal | ICD-10-CM | POA: Diagnosis present

## 2022-08-05 DIAGNOSIS — Z560 Unemployment, unspecified: Secondary | ICD-10-CM

## 2022-08-05 DIAGNOSIS — Z1152 Encounter for screening for COVID-19: Secondary | ICD-10-CM | POA: Diagnosis not present

## 2022-08-05 DIAGNOSIS — F1511 Other stimulant abuse, in remission: Secondary | ICD-10-CM | POA: Diagnosis present

## 2022-08-05 DIAGNOSIS — G4700 Insomnia, unspecified: Secondary | ICD-10-CM | POA: Diagnosis present

## 2022-08-05 DIAGNOSIS — F250 Schizoaffective disorder, bipolar type: Secondary | ICD-10-CM | POA: Diagnosis present

## 2022-08-05 DIAGNOSIS — F6089 Other specific personality disorders: Secondary | ICD-10-CM | POA: Diagnosis present

## 2022-08-05 DIAGNOSIS — Z8249 Family history of ischemic heart disease and other diseases of the circulatory system: Secondary | ICD-10-CM | POA: Diagnosis not present

## 2022-08-05 DIAGNOSIS — Z91199 Patient's noncompliance with other medical treatment and regimen due to unspecified reason: Secondary | ICD-10-CM | POA: Diagnosis not present

## 2022-08-05 DIAGNOSIS — F419 Anxiety disorder, unspecified: Secondary | ICD-10-CM | POA: Diagnosis present

## 2022-08-05 DIAGNOSIS — Z79899 Other long term (current) drug therapy: Secondary | ICD-10-CM | POA: Diagnosis not present

## 2022-08-05 MED ORDER — TOPIRAMATE 25 MG PO TABS
75.0000 mg | ORAL_TABLET | Freq: Every day | ORAL | Status: DC
  Administered 2022-08-05 – 2022-08-22 (×17): 75 mg via ORAL
  Filled 2022-08-05 (×18): qty 3

## 2022-08-05 MED ORDER — ONDANSETRON HCL 4 MG PO TABS: 4.0000 mg | ORAL_TABLET | Freq: Three times a day (TID) | ORAL | Status: DC | PRN

## 2022-08-05 MED ORDER — OLANZAPINE 5 MG PO TABS
7.5000 mg | ORAL_TABLET | Freq: Two times a day (BID) | ORAL | Status: DC
  Administered 2022-08-05 – 2022-08-06 (×2): 7.5 mg via ORAL
  Filled 2022-08-05 (×2): qty 2

## 2022-08-05 MED ORDER — NICOTINE 21 MG/24HR TD PT24
21.0000 mg | MEDICATED_PATCH | Freq: Every day | TRANSDERMAL | Status: DC
  Administered 2022-08-13 – 2022-08-22 (×10): 21 mg via TRANSDERMAL
  Filled 2022-08-05 (×12): qty 1

## 2022-08-05 MED ORDER — OLANZAPINE 10 MG IM SOLR: 10.0000 mg | Freq: Two times a day (BID) | INTRAMUSCULAR | Status: DC | PRN

## 2022-08-05 MED ORDER — OLANZAPINE 10 MG PO TABS
10.0000 mg | ORAL_TABLET | Freq: Two times a day (BID) | ORAL | Status: DC | PRN
  Administered 2022-08-06 – 2022-08-21 (×4): 10 mg via ORAL
  Filled 2022-08-05 (×4): qty 1

## 2022-08-05 MED ORDER — DIPHENHYDRAMINE HCL 25 MG PO CAPS: 50.0000 mg | ORAL_CAPSULE | Freq: Two times a day (BID) | ORAL | Status: DC | PRN

## 2022-08-05 MED ORDER — MAGNESIUM HYDROXIDE 400 MG/5ML PO SUSP: 30.0000 mL | Freq: Every day | ORAL | Status: DC | PRN

## 2022-08-05 MED ORDER — DIPHENHYDRAMINE HCL 50 MG/ML IJ SOLN: 50.0000 mg | Freq: Two times a day (BID) | INTRAMUSCULAR | Status: DC | PRN

## 2022-08-05 MED ORDER — ALUM & MAG HYDROXIDE-SIMETH 200-200-20 MG/5ML PO SUSP
30.0000 mL | Freq: Four times a day (QID) | ORAL | Status: DC | PRN
  Administered 2022-08-08: 30 mL via ORAL
  Filled 2022-08-05: qty 30

## 2022-08-05 MED ORDER — ACETAMINOPHEN 325 MG PO TABS: 650.0000 mg | ORAL_TABLET | Freq: Four times a day (QID) | ORAL | Status: DC | PRN

## 2022-08-05 MED ORDER — OLANZAPINE 10 MG PO TABS: 10.0000 mg | ORAL_TABLET | Freq: Two times a day (BID) | ORAL | Status: DC | PRN

## 2022-08-05 MED ORDER — DIPHENHYDRAMINE HCL 25 MG PO CAPS
50.0000 mg | ORAL_CAPSULE | Freq: Two times a day (BID) | ORAL | Status: DC | PRN
  Administered 2022-08-06 – 2022-08-13 (×2): 50 mg via ORAL
  Filled 2022-08-05 (×2): qty 2

## 2022-08-05 MED ORDER — IBUPROFEN 600 MG PO TABS: 600.0000 mg | ORAL_TABLET | Freq: Three times a day (TID) | ORAL | Status: DC | PRN

## 2022-08-05 MED ORDER — OLANZAPINE 5 MG PO TABS
7.5000 mg | ORAL_TABLET | Freq: Two times a day (BID) | ORAL | Status: DC
  Administered 2022-08-05: 7.5 mg via ORAL
  Filled 2022-08-05: qty 2

## 2022-08-05 NOTE — Plan of Care (Signed)
New admission.  Problem: Education: Goal: Knowledge of General Education information will improve Description: Including pain rating scale, medication(s)/side effects and non-pharmacologic comfort measures Outcome: Not Progressing   Problem: Health Behavior/Discharge Planning: Goal: Ability to manage health-related needs will improve Outcome: Not Progressing   Problem: Clinical Measurements: Goal: Ability to maintain clinical measurements within normal limits will improve Outcome: Not Progressing Goal: Will remain free from infection Outcome: Not Progressing Goal: Diagnostic test results will improve Outcome: Not Progressing Goal: Respiratory complications will improve Outcome: Not Progressing Goal: Cardiovascular complication will be avoided Outcome: Not Progressing   Problem: Activity: Goal: Risk for activity intolerance will decrease Outcome: Not Progressing   Problem: Nutrition: Goal: Adequate nutrition will be maintained Outcome: Not Progressing   Problem: Coping: Goal: Level of anxiety will decrease Outcome: Not Progressing   Problem: Elimination: Goal: Will not experience complications related to bowel motility Outcome: Not Progressing Goal: Will not experience complications related to urinary retention Outcome: Not Progressing   Problem: Pain Managment: Goal: General experience of comfort will improve Outcome: Not Progressing   Problem: Safety: Goal: Ability to remain free from injury will improve Outcome: Not Progressing   Problem: Skin Integrity: Goal: Risk for impaired skin integrity will decrease Outcome: Not Progressing   Problem: Education: Goal: Knowledge of [place] [facility] Education information/materials will improve Outcome: Not Progressing Goal: Emotional status will improve Outcome: Not Progressing Goal: Mental status will improve Outcome: Not Progressing Goal: Verbalization of understanding the information provided will  improve Outcome: Not Progressing   Problem: Safety: Goal: Periods of time without injury will increase Outcome: Not Progressing   Problem: Education: Goal: Will be free of psychotic symptoms Outcome: Not Progressing Goal: Knowledge of the prescribed therapeutic regimen will improve Outcome: Not Progressing   

## 2022-08-05 NOTE — Consult Note (Cosign Needed Addendum)
[facility] Face-to-Face Psychiatry Consult   Reason for Consult:  mania and change in cognition Referring Physician:  EDP Patient Identification: Janice Walls MRN:  [id number] Principal Diagnosis: Bipolar I disorder, most recent episode (or current) manic Diagnosis:  Principal Problem:   Bipolar I disorder, most recent episode (or current) manic   Total Time spent with patient: 45 minutes  Subjective:   Janice Walls is a 37 y.o. female patient admitted with mania and bizarre symptoms, history of bipolar disorder.  HPI:  37 yo female presented from [facility] with mania and bizarre behaviors.  Initially, she reported depression with a lack of friend and support and decreased appetite, racing thoughts.  She is currently hyper religious with an increase in activity.  No psychosis noted today after she slept.  Labile mood continues with denial of suicidal/homicidal ideations.  She is admitting to the BMU at [facility] for stabilization to continue.  Past Psychiatric History: bipolar disorder  Risk to Self:  none Risk to Others:  none Prior Inpatient Therapy:  yes Prior Outpatient Therapy:  [facility]  Past Medical History:  Past Medical History:  Diagnosis Date   Anxiety    Bipolar 1 disorder    Depression    Insomnia    Substance abuse     Past Surgical History:  Procedure Laterality Date   CESAREAN SECTION     CESAREAN SECTION     Family History:  Family History  Problem Relation Age of Onset   CAD Father 100   Family Psychiatric  History: none Social History:  Social History   Substance and Sexual Activity  Alcohol Use Not Currently   Comment: rare     Social History   Substance and Sexual Activity  Drug Use Not Currently   Types: Marijuana, Methamphetamines    Social History   Socioeconomic History   Marital status: Single    Spouse name: Not on file   Number of children: Not on file   Years of education: Not on file   Highest education level: Not on file  Occupational  History   Occupation: unemployed  Tobacco Use   Smoking status: Every Day    Packs/day: 1.00    Years: 18.00    Additional pack years: 0.00    Total pack years: 18.00    Types: Cigarettes   Smokeless tobacco: Never  Vaping Use   Vaping Use: Never used  Substance and Sexual Activity   Alcohol use: Not Currently    Comment: rare   Drug use: Not Currently    Types: Marijuana, Methamphetamines   Sexual activity: Yes    Birth control/protection: None  Other Topics Concern   Not on file  Social History Narrative   Not on file   Social Determinants of Health   Financial Resource Strain: Not on file  Food Insecurity: Not on file  Transportation Needs: Not on file  Physical Activity: Not on file  Stress: Not on file  Social Connections: Not on file   Additional Social History:    Allergies:  No Known Allergies  Labs:  Results for orders placed or performed during the hospital encounter of 08/04/22 (from the past 48 hour(s))  Comprehensive metabolic panel     Status: Abnormal   Collection Time: 08/04/22  5:10 PM  Result Value Ref Range   Sodium 137 135 - 145 mmol/L   Potassium 3.5 3.5 - 5.1 mmol/L   Chloride 108 98 - 111 mmol/L   CO2 16 (L) 22 - 32 mmol/L  Glucose, Bld 95 70 - 99 mg/dL    Comment: Glucose reference range applies only to samples taken after fasting for at least 8 hours.   BUN 9 6 - 20 mg/dL   Creatinine, Ser 4.09 0.44 - 1.00 mg/dL   Calcium 8.8 (L) 8.9 - 10.3 mg/dL   Total Protein 7.4 6.5 - 8.1 g/dL   Albumin 3.9 3.5 - 5.0 g/dL   AST 23 15 - 41 U/L   ALT 31 0 - 44 U/L   Alkaline Phosphatase 68 38 - 126 U/L   Total Bilirubin 0.6 0.3 - 1.2 mg/dL   GFR, Estimated >81 >19 mL/min    Comment: (NOTE) Calculated using the CKD-EPI Creatinine Equation (2021)    Anion gap 13 5 - 15    Comment: Performed at [facility], [address] [place], [place] [address]  Urine Drug Screen, Qualitative     Status: None   Collection Time: 08/04/22  5:10 PM   Result Value Ref Range   Tricyclic, Ur Screen NONE DETECTED NONE DETECTED   Amphetamines, Ur Screen NONE DETECTED NONE DETECTED   MDMA (Ecstasy)Ur Screen NONE DETECTED NONE DETECTED   Cocaine Metabolite,Ur [place] NONE DETECTED NONE DETECTED   Opiate, Ur Screen NONE DETECTED NONE DETECTED   Phencyclidine (PCP) Ur S NONE DETECTED NONE DETECTED   Cannabinoid 50 Ng, Ur [place] NONE DETECTED NONE DETECTED   Barbiturates, Ur Screen NONE DETECTED NONE DETECTED   Benzodiazepine, Ur Scrn NONE DETECTED NONE DETECTED   Methadone Scn, Ur NONE DETECTED NONE DETECTED    Comment: (NOTE) Tricyclics + metabolites, urine    Cutoff 1000 ng/mL Amphetamines + metabolites, urine  Cutoff 1000 ng/mL MDMA (Ecstasy), urine              Cutoff 500 ng/mL Cocaine Metabolite, urine          Cutoff 300 ng/mL Opiate + metabolites, urine        Cutoff 300 ng/mL Phencyclidine (PCP), urine         Cutoff 25 ng/mL Cannabinoid, urine                 Cutoff 50 ng/mL Barbiturates + metabolites, urine  Cutoff 200 ng/mL Benzodiazepine, urine              Cutoff 200 ng/mL Methadone, urine                   Cutoff 300 ng/mL  The urine drug screen provides only a preliminary, unconfirmed analytical test result and should not be used for non-medical purposes. Clinical consideration and professional judgment should be applied to any positive drug screen result due to possible interfering substances. A more specific alternate chemical method must be used in order to obtain a confirmed analytical result. Gas chromatography / mass spectrometry (GC/MS) is the preferred confirm atory method. Performed at [facility], [address] [place], [place] [address]   Pregnancy, urine     Status: None   Collection Time: 08/04/22  5:17 PM  Result Value Ref Range   Preg Test, Ur NEGATIVE NEGATIVE    Comment: Performed at [facility], [address] [place], [place] [address]  CBC     Status: Abnormal   Collection Time:  08/04/22  5:25 PM  Result Value Ref Range   WBC 11.5 (H) 4.0 - 10.5 K/uL   RBC 5.19 (H) 3.87 - 5.11 MIL/uL   Hemoglobin 15.0 12.0 - 15.0 g/dL   HCT 78.4 (H)  36.0 - 46.0 %   MCV 90.2 80.0 - 100.0 fL   MCH 28.9 26.0 - 34.0 pg   MCHC 32.1 30.0 - 36.0 g/dL   RDW 40.913.6 81.111.5 - 91.415.5 %   Platelets 288 150 - 400 K/uL   nRBC 0.0 0.0 - 0.2 %    Comment: Performed at [facility], [address] [place], [place] [address]  Acetaminophen level     Status: Abnormal   Collection Time: 08/04/22  5:40 PM  Result Value Ref Range   Acetaminophen (Tylenol), Serum <10 (L) 10 - 30 ug/mL    Comment: (NOTE) Therapeutic concentrations vary significantly. A range of 10-30 ug/mL  may be an effective concentration for many patients. However, some  are best treated at concentrations outside of this range. Acetaminophen concentrations >150 ug/mL at 4 hours after ingestion  and >50 ug/mL at 12 hours after ingestion are often associated with  toxic reactions.  Performed at [facility], [address] [place], [place] [address]   Ethanol     Status: None   Collection Time: 08/04/22  5:40 PM  Result Value Ref Range   Alcohol, Ethyl (B) <10 <10 mg/dL    Comment: (NOTE) Lowest detectable limit for serum alcohol is 10 mg/dL.  For medical purposes only. Performed at [facility], [address] [place], [place] [address]   Salicylate level     Status: Abnormal   Collection Time: 08/04/22  5:40 PM  Result Value Ref Range   Salicylate Lvl <7.0 (L) 7.0 - 30.0 mg/dL    Comment: Performed at [facility], [address] [place], [place] [address]  SARS Coronavirus 2 by RT PCR (hospital order, performed in [facility] hospital lab) *cepheid single result test* Anterior Nasal Swab     Status: None   Collection Time: 08/04/22  8:57 PM   Specimen: Anterior Nasal Swab  Result Value Ref Range   SARS Coronavirus 2 by RT PCR NEGATIVE NEGATIVE    Comment: (NOTE) SARS-CoV-2  target nucleic acids are NOT DETECTED.  The SARS-CoV-2 RNA is generally detectable in upper and lower respiratory specimens during the acute phase of infection. The lowest concentration of SARS-CoV-2 viral copies this assay can detect is 250 copies / mL. A negative result does not preclude SARS-CoV-2 infection and should not be used as the sole basis for treatment or other patient management decisions.  A negative result may occur with improper specimen collection / handling, submission of specimen other than nasopharyngeal swab, presence of viral mutation(s) within the areas targeted by this assay, and inadequate number of viral copies (<250 copies / mL). A negative result must be combined with clinical observations, patient history, and epidemiological information.  Fact Sheet for Patients:   RoadLapTop.co.zahttps://www.fda.gov/media/158405/download  Fact Sheet for Healthcare Providers: http://kim-miller.com/https://www.fda.gov/media/158404/download  This test is not yet approved or  cleared by the [place] FDA and has been authorized for detection and/or diagnosis of SARS-CoV-2 by FDA under an Emergency Use Authorization (EUA).  This EUA will remain in effect (meaning this test can be used) for the duration of the COVID-19 declaration under Section 564(b)(1) of the Act, 21 U.S.C. section 360bbb-3(b)(1), unless the authorization is terminated or revoked sooner.  Performed at [facility], [address] [place], [place] [address]     Current Facility-Administered Medications  Medication Dose Route Frequency Provider Last Rate Last Admin   alum & mag hydroxide-simeth (MAALOX/MYLANTA) 200-200-20 MG/5ML suspension 30 mL  30 mL Oral Q6H  PRN Sharman Cheek, MD       ibuprofen (ADVIL) tablet 600 mg  600 mg Oral Q8H PRN Sharman Cheek, MD       ondansetron [facility]) tablet 4 mg  4 mg Oral Q8H PRN Sharman Cheek, MD       Current Outpatient Medications  Medication Sig Dispense Refill   CAPLYTA  42 MG capsule Take 42 mg by mouth daily. (Patient not taking: Reported on 08/04/2022)     HALDOL DECANOATE 100 MG/ML injection Inject 100 mg into the muscle every 30 (thirty) days. (Patient not taking: Reported on 08/04/2022)     INGREZZA 80 MG capsule Take 80 mg by mouth daily. (Patient not taking: Reported on 08/04/2022)     naloxone [facility]) nasal spray 4 mg/0.1 mL Place 1 spray into the nose as directed. (Patient not taking: Reported on 08/04/2022)     TOPAMAX 25 MG tablet Take 75 mg by mouth daily. (Patient not taking: Reported on 08/04/2022)      Musculoskeletal: Strength & Muscle Tone: within normal limits Gait & Station: normal Patient leans: N/A  Psychiatric Specialty Exam: Physical Exam Vitals and nursing note reviewed.  Constitutional:      Appearance: Normal appearance.  HENT:     Head: Normocephalic.     Nose: Nose normal.  Pulmonary:     Effort: Pulmonary effort is normal.  Musculoskeletal:        General: Normal range of motion.     Cervical back: Normal range of motion.  Neurological:     General: No focal deficit present.     Mental Status: She is alert and oriented to person, place, and time.  Psychiatric:        Attention and Perception: She is inattentive.        Mood and Affect: Mood is anxious and elated.        Speech: Speech is tangential.        Behavior: Behavior is hyperactive.        Thought Content: Thought content is delusional.        Cognition and Memory: Cognition is impaired.        Judgment: Judgment is impulsive.     Review of Systems  Psychiatric/Behavioral:  The patient is nervous/anxious.   All other systems reviewed and are negative.   Blood pressure 106/77, pulse 90, temperature 98.5 F (36.9 C), resp. rate 18, SpO2 100 %.There is no height or weight on file to calculate BMI.  General Appearance: Disheveled  Eye Contact:  Fair  Speech:  Normal Rate  Volume:  Normal  Mood:  Anxious and Euphoric  Affect:  Congruent  Thought Process:   Descriptions of Associations: Tangential  Orientation:  Full (Time, Place, and Person)  Thought Content:  Delusions and Tangential  Suicidal Thoughts:  No  Homicidal Thoughts:  No  Memory:  Immediate;   Fair Recent;   Poor Remote;   Fair  Judgement:  Impaired  Insight:  Lacking  Psychomotor Activity:  Increased  Concentration:  Concentration: Poor and Attention Span: Poor  Recall:  [employer] of Knowledge:  Fair  Language:  Good  Akathisia:  No  Handed:  Right  AIMS (if indicated):     Assets:  Housing Leisure Time Physical Health Resilience  ADL's:  Intact  Cognition:  Impaired,  Mild  Sleep:        Physical Exam: Physical Exam Vitals and nursing note reviewed.  Constitutional:      Appearance: Normal appearance.  HENT:     Head: Normocephalic.     Nose: Nose normal.  Pulmonary:     Effort: Pulmonary effort is normal.  Musculoskeletal:        General: Normal range of motion.     Cervical back: Normal range of motion.  Neurological:     General: No focal deficit present.     Mental Status: She is alert and oriented to person, place, and time.  Psychiatric:        Attention and Perception: She is inattentive.        Mood and Affect: Mood is anxious and elated.        Speech: Speech is tangential.        Behavior: Behavior is hyperactive.        Thought Content: Thought content is delusional.        Cognition and Memory: Cognition is impaired.        Judgment: Judgment is impulsive.    Review of Systems  Psychiatric/Behavioral:  The patient is nervous/anxious.   All other systems reviewed and are negative.  Blood pressure 136/89, pulse (!) 107, temperature 98 F (36.7 C), resp. rate 18, SpO2 100 %. There is no height or weight on file to calculate BMI.  Treatment Plan Summary: Daily contact with patient to assess and evaluate symptoms and progress in treatment, Medication management, and Plan : Bipolar disorder, mania, moderate: Zyprexa 7.5 mg  BID  Disposition: Recommend psychiatric Inpatient admission when medically cleared.  Nanine Means, NP 08/05/2022 9:39 AM

## 2022-08-05 NOTE — ED Notes (Signed)
ivc/pending consult/psych NP determined pt. meets psychiatric inpatient criteria.

## 2022-08-05 NOTE — ED Notes (Signed)
Pts belongings brought to the designated locked unit within the BHU. Pts belongings placed in an individual tote labeled with pts name and the date the belongings were placed in the locked unit.

## 2022-08-05 NOTE — Plan of Care (Signed)
Pt is new to the unit haven't had time to progress.   Problem: Activity: Goal: Risk for activity intolerance will decrease Outcome: Not Progressing   Problem: Coping: Goal: Level of anxiety will decrease Outcome: Not Progressing   Problem: Pain Managment: Goal: General experience of comfort will improve Outcome: Not Progressing   Problem: Safety: Goal: Ability to remain free from injury will improve Outcome: Not Progressing

## 2022-08-05 NOTE — ED Notes (Signed)
Pt visitor here

## 2022-08-05 NOTE — ED Notes (Signed)
Called report to [facility] RN

## 2022-08-05 NOTE — ED Provider Notes (Signed)
Emergency Medicine Observation Re-evaluation Note  Janice Walls is a 37 y.o. female, seen on rounds today.  Pt initially presented to the ED for complaints of IVC Currently, the patient is resting, voices no medical complaints.  Physical Exam  BP 136/89   Pulse (!) 107   Temp 98 F (36.7 C)   Resp 18   SpO2 100%  Physical Exam General: Resting in no acute distress Cardiac: No cyanosis Lungs: Equal rise and fall Psych: Not agitated  ED Course / MDM  EKG:   I have reviewed the labs performed to date as well as medications administered while in observation.  Recent changes in the last 24 hours include no events overnight.  Plan  Current plan is for psychiatric disposition.    Irean Hong, MD 08/05/22 [phone]

## 2022-08-05 NOTE — Tx Team (Signed)
Initial Treatment Plan 08/05/2022 7:12 PM Janice Walls HFW:[id number]    PATIENT STRESSORS: Marital or family conflict   Medication change or noncompliance     PATIENT STRENGTHS: Communication skills  General fund of knowledge    PATIENT IDENTIFIED PROBLEMS: Psychosis prior to admission  Medication noncompliance                   DISCHARGE CRITERIA:  Ability to meet basic life and health needs Improved stabilization in mood, thinking, and/or behavior Motivation to continue treatment in a less acute level of care Need for constant or close observation no longer present Reduction of life-threatening or endangering symptoms to within safe limits  PRELIMINARY DISCHARGE PLAN: Outpatient therapy Return to previous living arrangement  PATIENT/FAMILY INVOLVEMENT: This treatment plan has been presented to and reviewed with the patient, Janice Walls. The patient has been given the opportunity to ask questions and make suggestions.  Shaughn Thomley, RN 08/05/2022, 7:12 PM

## 2022-08-05 NOTE — Group Note (Signed)
Date:  08/05/2022 Time:  6:25 PM  Group Topic/Focus:  Community  Meeting, Goals Group, Activity- outside/courtyard    Participation Level:  Did Not Attend   Duayne Brideau Travis Jodye Scali 08/05/2022, 6:25 PM  

## 2022-08-05 NOTE — ED Notes (Signed)
Patient is IVC pending inpatient psych admit 

## 2022-08-05 NOTE — Progress Notes (Signed)
Admission Note:   Report was received from [place], [place] on a 37 year-old female who presents IVC in no acute distress for the treatment of bizarre behavior and Psychosis. Patient appears flat and depressed. Patient was calm and cooperative with admission process. Patient stated that she's here because "I was feeling an evil spirit in my Step-Dad and  he came over to the house and I was reading the bible. I could feel the spirit leaving him and he started cursing and my Mama called the police and they brought me here". Patient denies SI/HI/AVH and pain at this time. Patient also denies any signs/symptoms of depression and anxiety, stating "no, not really". Patient states she has no stressors and no goals for treatment. Patient has a past medical history of Bipolar. Skin was assessed with [facility], RN and found to have multiple tattoos: right side, lower back, right thigh, bilateral ankles, left ring finger and dry, callused feet. Patient searched and no contraband found and unit policies explained and understanding verbalized. Consents obtained. Food and fluids offered, and both accepted. Patient had no additional questions or concerns to voice to this Clinical research associate.Patient remains safe on the unit.

## 2022-08-05 NOTE — ED Notes (Signed)
Pt asking to speak with the chaplain, and would also like a bible. Soft cover bible provided and chaplain has been paged. Will place an order for the chaplain to come see pt.

## 2022-08-06 DIAGNOSIS — F3112 Bipolar disorder, current episode manic without psychotic features, moderate: Secondary | ICD-10-CM | POA: Diagnosis not present

## 2022-08-06 MED ORDER — LORAZEPAM 1 MG PO TABS
1.0000 mg | ORAL_TABLET | Freq: Two times a day (BID) | ORAL | Status: DC
  Administered 2022-08-06: 1 mg via ORAL
  Filled 2022-08-06 (×2): qty 1

## 2022-08-06 MED ORDER — VALBENAZINE TOSYLATE 40 MG PO CAPS
80.0000 mg | ORAL_CAPSULE | Freq: Every day | ORAL | Status: DC
  Administered 2022-08-08 – 2022-08-22 (×15): 80 mg via ORAL
  Filled 2022-08-06 (×17): qty 2

## 2022-08-06 MED ORDER — OLANZAPINE 10 MG PO TABS: 10.0000 mg | ORAL_TABLET | Freq: Two times a day (BID) | ORAL | Status: DC

## 2022-08-06 MED ORDER — LORAZEPAM 1 MG PO TABS: 1.0000 mg | ORAL_TABLET | Freq: Two times a day (BID) | ORAL | Status: DC

## 2022-08-06 MED ORDER — METOPROLOL SUCCINATE ER 25 MG PO TB24
50.0000 mg | ORAL_TABLET | Freq: Every day | ORAL | Status: DC
  Administered 2022-08-08 – 2022-08-22 (×12): 50 mg via ORAL
  Filled 2022-08-06 (×17): qty 2

## 2022-08-06 MED ORDER — NICOTINE POLACRILEX 2 MG MT GUM: 2.0000 mg | CHEWING_GUM | OROMUCOSAL | Status: DC | PRN

## 2022-08-06 MED ORDER — HALOPERIDOL 5 MG PO TABS
5.0000 mg | ORAL_TABLET | Freq: Two times a day (BID) | ORAL | Status: DC
  Administered 2022-08-08 – 2022-08-09 (×3): 5 mg via ORAL
  Filled 2022-08-06 (×5): qty 1

## 2022-08-06 MED ORDER — TRAZODONE HCL 100 MG PO TABS
100.0000 mg | ORAL_TABLET | Freq: Every day | ORAL | Status: DC
  Administered 2022-08-06 – 2022-08-08 (×2): 100 mg via ORAL
  Filled 2022-08-06 (×2): qty 1

## 2022-08-06 NOTE — Progress Notes (Addendum)
Patient is instructed multiple times that medication is available. Pt states "I am trying to sleep man, I heard you."   Patient was given 7.5 mg dosage of Zyprexa @ 0903 and not the 10 mg Prn dosage. MAR locked from being able to be edited.

## 2022-08-06 NOTE — Progress Notes (Signed)
Pt received in bed with eyes open. RR even and  unlabored. Pt remains guarded and hyper religious. Isolates in room except for meds/meals. States she just spoke with [place] Support and encouragement provided. Q 15 min checks maintained. Pt informed to notify staff problems and concerns. Denies SI/HI/AV/H. No c/o pain/discomfort noted. Plan of care continued.

## 2022-08-06 NOTE — Group Note (Signed)
Date:  08/06/2022 Time:  5:29 PM  Group Topic/Focus:  Activity Group    Participation Level:  Did Not Attend   Lynelle Smoke [facility] 08/06/2022, 5:29 PM

## 2022-08-06 NOTE — BHH Suicide Risk Assessment (Signed)
[facility] Admission Suicide Risk Assessment   Nursing information obtained from:  Patient Demographic factors:  Caucasian, Living alone Current Mental Status:  NA Loss Factors:  NA Historical Factors:  NA Risk Reduction Factors:  NA  Total Time spent with patient: 1 hour Principal Problem: Bipolar affective disorder, currently manic, moderate Diagnosis:  Principal Problem:   Bipolar affective disorder, currently manic, moderate  Subjective Data:  Patient is a 37 year old white female who was involuntarily committed by her mother and stepfather.  She was verbally aggressive towards them.  Apparently, she has been running down their street naked.  Mom has guardianship.  Patient tells me that she is followed by the ACT team but does not know what medication she is on.  She says that she has not seen the ACT team in 2 weeks.  Reading through her chart she is on Haldol Decanoate 100 mg every 30 days.  She is also on Ingrezza 80 mg/day and Topamax.  She has obvious flight of ideas and tangential thinking.  She is hyperreligious and not taking her medications.  She attends [facility]. Patient stated that she's here because "I was feeling an evil spirit in my Step-Dad and he came over to the house and I was reading the bible. I could feel the spirit leaving him and he started cursing and my Mama called the police and they brought me here".  She has a history of methamphetamine use, benzodiazepine use and a history of cluster B personality disorder.  She has been on propranolol in the past.  Her blood pressure is still pretty high.  Due to her current mania she is a poor historian.  Continued Clinical Symptoms:  Alcohol Use Disorder Identification Test Final Score (AUDIT): 0 The "Alcohol Use Disorders Identification Test", Guidelines for Use in Primary Care, Second Edition.  World Science writer [employer]). Score between 0-7:  no or low risk or alcohol related problems. Score between 8-15:  moderate risk of alcohol  related problems. Score between 16-19:  high risk of alcohol related problems. Score 20 or above:  warrants further diagnostic evaluation for alcohol dependence and treatment.   CLINICAL FACTORS:   Bipolar Disorder:   Mixed State   Musculoskeletal: Strength & Muscle Tone: within normal limits Gait & Station: normal Patient leans: N/A  Psychiatric Specialty Exam:  Presentation  General Appearance: No data recorded Eye Contact:No data recorded Speech:No data recorded Speech Volume:No data recorded Handedness:No data recorded  Mood and Affect  Mood:No data recorded Affect:No data recorded  Thought Process  Thought Processes:No data recorded Descriptions of Associations:No data recorded Orientation:No data recorded Thought Content:No data recorded History of Schizophrenia/Schizoaffective disorder:Yes  Duration of Psychotic Symptoms:Greater than six months  Hallucinations:No data recorded Ideas of Reference:No data recorded Suicidal Thoughts:No data recorded Homicidal Thoughts:No data recorded  Sensorium  Memory:No data recorded Judgment:No data recorded Insight:No data recorded  Executive Functions  Concentration:No data recorded Attention Span:No data recorded Recall:No data recorded Fund of Knowledge:No data recorded Language:No data recorded  Psychomotor Activity  Psychomotor Activity:No data recorded  Assets  Assets:No data recorded  Sleep  Sleep:No data recorded    Blood pressure (!) 140/92, pulse 86, temperature 97.7 F (36.5 C), temperature source Oral, resp. rate 18, height 5\' 2"  (1.575 m), weight 93 kg, SpO2 100 %. Body mass index is 37.49 kg/m.   COGNITIVE FEATURES THAT CONTRIBUTE TO RISK:  Loss of executive function and Thought constriction (tunnel vision)    SUICIDE RISK:   Minimal: No identifiable suicidal ideation.  Patients presenting with no risk factors but with morbid ruminations; may be classified as minimal risk based on the  severity of the depressive symptoms  PLAN OF CARE: See orders  I certify that inpatient services furnished can reasonably be expected to improve the patient's condition.   Jasslyn Finkel Tresea Mall, DO 08/06/2022, 11:11 AM

## 2022-08-06 NOTE — Plan of Care (Signed)
Pt denies SI / HI /AVH. Pt is bizarre at times and confused. Pt is frequently at nurses station with requests. Pt has to be told multiple times to pull people up. No signs of distress or injury. Staff will continue safety checks Q15 mins.    Problem: Education: Goal: Knowledge of [facility] Education information/materials will improve Outcome: Not Progressing Goal: Emotional status will improve Outcome: Not Progressing Goal: Mental status will improve Outcome: Not Progressing

## 2022-08-06 NOTE — [facility] Group Notes (Signed)
[facility] Group Notes:  (Nursing/MHT/Case Management/Adjunct)  Date:  08/06/2022  Time:  9:12 PM  Type of Therapy:   wrap up  Participation Level:  Did Not Attend  Participation Quality:   did not attend  Affect:   did not attend  Cognitive:   did not attend  Insight:  None  Engagement in Group:   did not attend  Modes of Intervention:   did not attend  Summary of Progress/Problems:  Janice Walls 08/06/2022, 9:12 PM

## 2022-08-06 NOTE — [facility] Group Notes (Signed)
[facility] Group Notes:  (Nursing/MHT/Case Management/Adjunct)  Date:  08/06/2022  Time:  9:10 PM  Type of Therapy:   wrap up  Participation Level:  Active  Participation Quality:  Appropriate  Affect:  Appropriate  Cognitive:  Appropriate  Insight:  Appropriate  Engagement in Group:  Engaged  Modes of Intervention:  Activity  Summary of Progress/Problems:this group was to help the patients to relax some before they went to bed and focus on a goal for tomorrow.  Tacy Dura 08/06/2022, 9:10 PM

## 2022-08-06 NOTE — H&P (Signed)
Psychiatric Admission Assessment Adult  Patient Identification: Janice Walls MRN:  [id number] Date of Evaluation:  08/06/2022 Chief Complaint:  Bipolar affective disorder, currently manic, moderate [F31.12] Principal Diagnosis: Bipolar affective disorder, currently manic, moderate Diagnosis:  Principal Problem:   Bipolar affective disorder, currently manic, moderate  History of Present Illness: Patient is a 37 year old white female who was involuntarily committed by her mother and stepfather.  She was verbally aggressive towards them.  Apparently, she has been running down their street naked.  Mom has guardianship.  Patient tells me that she is followed by the ACT team but does not know what medication she is on.  She says that she has not seen the ACT team in 2 weeks.  Reading through her chart she is on Haldol Decanoate 100 mg every 30 days.  She is also on Ingrezza 80 mg/day and Topamax.  She has obvious flight of ideas and tangential thinking.  She is hyperreligious and not taking her medications.  She attends [employer]. Patient stated that she's here because "I was feeling an evil spirit in my Step-Dad and he came over to the house and I was reading the bible. I could feel the spirit leaving him and he started cursing and my Mama called the police and they brought me here".  She has a history of methamphetamine use, benzodiazepine use and a history of cluster B personality disorder.  She has been on propranolol in the past.  Her blood pressure is still pretty high.  Due to her current mania she is a poor historian.  Associated Signs/Symptoms: Depression Symptoms:  psychomotor agitation, anxiety, disturbed sleep, (Hypo) Manic Symptoms:  Delusions, Elevated Mood, Flight of Ideas, Grandiosity, Impulsivity, Anxiety Symptoms:  Social Anxiety, Psychotic Symptoms:  Delusions, Paranoia, PTSD Symptoms: NA Total Time spent with patient: 1 hour  Past Psychiatric History: Extensive history of  bipolar disorder with psychotic features.  Numerous psychiatric hospitalizations and followed by [facility] and ACT team.  Mom has guardianship.  Is the patient at risk to self? Yes.    Has the patient been a risk to self in the past 6 months? Yes.    Has the patient been a risk to self within the distant past? Yes.    Is the patient a risk to others? Yes.    Has the patient been a risk to others in the past 6 months? No.  Has the patient been a risk to others within the distant past? No.   [place] Scale:  Flowsheet Row Admission (Current) from 08/05/2022 in [facility] INPATIENT BEH[facility]AL MEDICINE ED from 08/04/2022 in [facility] Emergency Department at [facility] ED from 12/14/2021 in [facility] Emergency Department at [facility]  C-SSRS RISK CATEGORY No Risk No Risk No Risk        Prior Inpatient Therapy: Yes.    Prior Outpatient Therapy: Yes.   If yes, describe as above  Alcohol Screening: 1. How often do you have a drink containing alcohol?: Never 2. How many drinks containing alcohol do you have on a typical day when you are drinking?: 1 or 2 3. How often do you have six or more drinks on one occasion?: Never AUDIT-C Score: 0 4. How often during the last year have you found that you were not able to stop drinking once you had started?: Never 5. How often during the last year have you failed to do what was normally expected from you because of drinking?: Never 6. How often during the last year have you  needed a first drink in the morning to get yourself going after a heavy drinking session?: Never 7. How often during the last year have you had a feeling of guilt of remorse after drinking?: Never 8. How often during the last year have you been unable to remember what happened the night before because you had been drinking?: Never 9. Have you or someone else been injured as a result of your drinking?: No 10. Has a relative or friend or a doctor or another health worker been concerned  about your drinking or suggested you cut down?: No Alcohol Use Disorder Identification Test Final Score (AUDIT): 0 Substance Abuse History in the last 12 months:  Yes.   Consequences of Substance Abuse: NA Previous Psychotropic Medications: Yes  Psychological Evaluations: Yes  Past Medical History:  Past Medical History:  Diagnosis Date   Anxiety    Bipolar 1 disorder    Depression    Insomnia    Substance abuse     Past Surgical History:  Procedure Laterality Date   CESAREAN SECTION     CESAREAN SECTION     Family History:  Family History  Problem Relation Age of Onset   CAD Father 49   Family Psychiatric  History: Unremarkable Tobacco Screening:  Social History   Tobacco Use  Smoking Status Every Day   Packs/day: 1.00   Years: 18.00   Additional pack years: 0.00   Total pack years: 18.00   Types: Cigarettes  Smokeless Tobacco Never    BH Tobacco Counseling     Are you interested in Tobacco Cessation Medications?  Yes, implement Nicotene Replacement Protocol Counseled patient on smoking cessation:  Refused/Declined practical counseling Reason Tobacco Screening Not Completed: No value filed.       Social History:  Social History   Substance and Sexual Activity  Alcohol Use Not Currently   Comment: rare     Social History   Substance and Sexual Activity  Drug Use Not Currently   Types: Marijuana, Methamphetamines    Additional Social History: Marital status: Single Does patient have children?: Yes How many children?: 2 (ages 60 and 104) How is patient's relationship with their children?: relationship is not so good, they are living with her mother                         Allergies:  No Known Allergies Lab Results:  Results for orders placed or performed during the hospital encounter of 08/04/22 (from the past 48 hour(s))  Comprehensive metabolic panel     Status: Abnormal   Collection Time: 08/04/22  5:10 PM  Result Value Ref Range    Sodium 137 135 - 145 mmol/L   Potassium 3.5 3.5 - 5.1 mmol/L   Chloride 108 98 - 111 mmol/L   CO2 16 (L) 22 - 32 mmol/L   Glucose, Bld 95 70 - 99 mg/dL    Comment: Glucose reference range applies only to samples taken after fasting for at least 8 hours.   BUN 9 6 - 20 mg/dL   Creatinine, Ser 1.61 0.44 - 1.00 mg/dL   Calcium 8.8 (L) 8.9 - 10.3 mg/dL   Total Protein 7.4 6.5 - 8.1 g/dL   Albumin 3.9 3.5 - 5.0 g/dL   AST 23 15 - 41 U/L   ALT 31 0 - 44 U/L   Alkaline Phosphatase 68 38 - 126 U/L   Total Bilirubin 0.6 0.3 - 1.2 mg/dL   GFR, Estimated >  60 >60 mL/min    Comment: (NOTE) Calculated using the CKD-EPI Creatinine Equation (2021)    Anion gap 13 5 - 15    Comment: Performed at [facility], [address] [place], [place] [address]  Urine Drug Screen, Qualitative     Status: None   Collection Time: 08/04/22  5:10 PM  Result Value Ref Range   Tricyclic, Ur Screen NONE DETECTED NONE DETECTED   Amphetamines, Ur Screen NONE DETECTED NONE DETECTED   MDMA (Ecstasy)Ur Screen NONE DETECTED NONE DETECTED   Cocaine Metabolite,Ur [place] NONE DETECTED NONE DETECTED   Opiate, Ur Screen NONE DETECTED NONE DETECTED   Phencyclidine (PCP) Ur S NONE DETECTED NONE DETECTED   Cannabinoid 50 Ng, Ur [place] NONE DETECTED NONE DETECTED   Barbiturates, Ur Screen NONE DETECTED NONE DETECTED   Benzodiazepine, Ur Scrn NONE DETECTED NONE DETECTED   Methadone Scn, Ur NONE DETECTED NONE DETECTED    Comment: (NOTE) Tricyclics + metabolites, urine    Cutoff 1000 ng/mL Amphetamines + metabolites, urine  Cutoff 1000 ng/mL MDMA (Ecstasy), urine              Cutoff 500 ng/mL Cocaine Metabolite, urine          Cutoff 300 ng/mL Opiate + metabolites, urine        Cutoff 300 ng/mL Phencyclidine (PCP), urine         Cutoff 25 ng/mL Cannabinoid, urine                 Cutoff 50 ng/mL Barbiturates + metabolites, urine  Cutoff 200 ng/mL Benzodiazepine, urine              Cutoff 200 ng/mL Methadone, urine                    Cutoff 300 ng/mL  The urine drug screen provides only a preliminary, unconfirmed analytical test result and should not be used for non-medical purposes. Clinical consideration and professional judgment should be applied to any positive drug screen result due to possible interfering substances. A more specific alternate chemical method must be used in order to obtain a confirmed analytical result. Gas chromatography / mass spectrometry (GC/MS) is the preferred confirm atory method. Performed at [facility], [address] [place], [place] [address]   Pregnancy, urine     Status: None   Collection Time: 08/04/22  5:17 PM  Result Value Ref Range   Preg Test, Ur NEGATIVE NEGATIVE    Comment: Performed at [facility], [address] [place], [place] [address]  CBC     Status: Abnormal   Collection Time: 08/04/22  5:25 PM  Result Value Ref Range   WBC 11.5 (H) 4.0 - 10.5 K/uL   RBC 5.19 (H) 3.87 - 5.11 MIL/uL   Hemoglobin 15.0 12.0 - 15.0 g/dL   HCT 83.3 (H) 82.5 - 05.3 %   MCV 90.2 80.0 - 100.0 fL   MCH 28.9 26.0 - 34.0 pg   MCHC 32.1 30.0 - 36.0 g/dL   RDW 97.6 73.4 - 19.3 %   Platelets 288 150 - 400 K/uL   nRBC 0.0 0.0 - 0.2 %    Comment: Performed at [facility], [address] [place], [place] [address]  Acetaminophen level     Status: Abnormal   Collection Time: 08/04/22  5:40 PM  Result Value Ref Range   Acetaminophen (Tylenol), Serum <10 (L) 10 - 30 ug/mL    Comment: (NOTE) Therapeutic concentrations  vary significantly. A range of 10-30 ug/mL  may be an effective concentration for many patients. However, some  are best treated at concentrations outside of this range. Acetaminophen concentrations >150 ug/mL at 4 hours after ingestion  and >50 ug/mL at 12 hours after ingestion are often associated with  toxic reactions.  Performed at [facility], [address] [place], [place] [address]   Ethanol     Status: None    Collection Time: 08/04/22  5:40 PM  Result Value Ref Range   Alcohol, Ethyl (B) <10 <10 mg/dL    Comment: (NOTE) Lowest detectable limit for serum alcohol is 10 mg/dL.  For medical purposes only. Performed at [facility], [address] [place], [place] [address]   Salicylate level     Status: Abnormal   Collection Time: 08/04/22  5:40 PM  Result Value Ref Range   Salicylate Lvl <7.0 (L) 7.0 - 30.0 mg/dL    Comment: Performed at [facility], [address] [place], [place] [address]  SARS Coronavirus 2 by RT PCR (hospital order, performed in [facility] hospital lab) *cepheid single result test* Anterior Nasal Swab     Status: None   Collection Time: 08/04/22  8:57 PM   Specimen: Anterior Nasal Swab  Result Value Ref Range   SARS Coronavirus 2 by RT PCR NEGATIVE NEGATIVE    Comment: (NOTE) SARS-CoV-2 target nucleic acids are NOT DETECTED.  The SARS-CoV-2 RNA is generally detectable in upper and lower respiratory specimens during the acute phase of infection. The lowest concentration of SARS-CoV-2 viral copies this assay can detect is 250 copies / mL. A negative result does not preclude SARS-CoV-2 infection and should not be used as the sole basis for treatment or other patient management decisions.  A negative result may occur with improper specimen collection / handling, submission of specimen other than nasopharyngeal swab, presence of viral mutation(s) within the areas targeted by this assay, and inadequate number of viral copies (<250 copies / mL). A negative result must be combined with clinical observations, patient history, and epidemiological information.  Fact Sheet for Patients:   RoadLapTop.co.za  Fact Sheet for Healthcare Providers: http://kim-miller.com/  This test is not yet approved or  cleared by the [place] FDA and has been authorized for detection and/or diagnosis of SARS-CoV-2  by FDA under an Emergency Use Authorization (EUA).  This EUA will remain in effect (meaning this test can be used) for the duration of the COVID-19 declaration under Section 564(b)(1) of the Act, 21 U.S.C. section 360bbb-3(b)(1), unless the authorization is terminated or revoked sooner.  Performed at [facility], [address] [place], [place] [address]     Blood Alcohol level:  Lab Results  Component Value Date   [facility] <10 08/04/2022   ETH <10 03/22/2021    Metabolic Disorder Labs:  Lab Results  Component Value Date   HGBA1C 4.8 09/12/2016   MPG 91 09/12/2016   Lab Results  Component Value Date   PROLACTIN 37.4 (H) 09/12/2016   Lab Results  Component Value Date   CHOL 130 09/12/2016   TRIG 79 09/12/2016   HDL 41 09/12/2016   CHOLHDL 3.2 09/12/2016   VLDL 16 09/12/2016   LDLCALC 73 09/12/2016    Current Medications: Current Facility-Administered Medications  Medication Dose Route Frequency Provider Last Rate Last Admin   acetaminophen (TYLENOL) tablet 650 mg  650 mg Oral Q6H PRN Charm Rings, NP       alum &  mag hydroxide-simeth (MAALOX/MYLANTA) 200-200-20 MG/5ML suspension 30 mL  30 mL Oral Q6H PRN Charm RingsLord, Jamison Y, NP       diphenhydrAMINE (BENADRYL) capsule 50 mg  50 mg Oral BID PRN Charm RingsLord, Jamison Y, NP       Or   diphenhydrAMINE (BENADRYL) injection 50 mg  50 mg Intravenous BID PRN Charm RingsLord, Jamison Y, NP       ibuprofen (ADVIL) tablet 600 mg  600 mg Oral Q8H PRN Charm RingsLord, Jamison Y, NP       magnesium hydroxide (MILK OF MAGNESIA) suspension 30 mL  30 mL Oral Daily PRN Charm RingsLord, Jamison Y, NP       nicotine (NICODERM CQ - dosed in mg/24 hours) patch 21 mg  21 mg Transdermal Daily Clapacs, John T, MD       OLANZapine (ZYPREXA) tablet 10 mg  10 mg Oral BID PRN Charm RingsLord, Jamison Y, NP   10 mg at 08/06/22 [id number]   Or   OLANZapine (ZYPREXA) injection 10 mg  10 mg Intramuscular BID PRN Charm RingsLord, Jamison Y, NP       OLANZapine (ZYPREXA) tablet 10 mg  10 mg Oral BID Sarina IllHerrick,  Bo Rogue Edward, DO       ondansetron [facility](ZOFRAN) tablet 4 mg  4 mg Oral Q8H PRN Charm RingsLord, Jamison Y, NP       topiramate (TOPAMAX) tablet 75 mg  75 mg Oral Daily Charm RingsLord, Jamison Y, NP   75 mg at 08/06/22 [id number]   PTA Medications: Medications Prior to Admission  Medication Sig Dispense Refill Last Dose   CAPLYTA 42 MG capsule Take 42 mg by mouth daily. (Patient not taking: Reported on 08/04/2022)      HALDOL DECANOATE 100 MG/ML injection Inject 100 mg into the muscle every 30 (thirty) days. (Patient not taking: Reported on 08/04/2022)      INGREZZA 80 MG capsule Take 80 mg by mouth daily. (Patient not taking: Reported on 08/04/2022)      naloxone [facility](NARCAN) nasal spray 4 mg/0.1 mL Place 1 spray into the nose as directed. (Patient not taking: Reported on 08/04/2022)      TOPAMAX 25 MG tablet Take 75 mg by mouth daily. (Patient not taking: Reported on 08/04/2022)       Musculoskeletal: Strength & Muscle Tone: within normal limits Gait & Station: normal Patient leans: N/A            Psychiatric Specialty Exam:  Presentation  General Appearance: No data recorded Eye Contact:No data recorded Speech:No data recorded Speech Volume:No data recorded Handedness:No data recorded  Mood and Affect  Mood:No data recorded Affect:No data recorded  Thought Process  Thought Processes:No data recorded Duration of Psychotic Symptoms:N/A Past Diagnosis of Schizophrenia or Psychoactive disorder: Yes  Descriptions of Associations:No data recorded Orientation:No data recorded Thought Content:No data recorded Hallucinations:No data recorded Ideas of Reference:No data recorded Suicidal Thoughts:No data recorded Homicidal Thoughts:No data recorded  Sensorium  Memory:No data recorded Judgment:No data recorded Insight:No data recorded  Executive Functions  Concentration:No data recorded Attention Span:No data recorded Recall:No data recorded Fund of Knowledge:No data recorded Language:No data  recorded  Psychomotor Activity  Psychomotor Activity:No data recorded  Assets  Assets:No data recorded  Sleep  Sleep:No data recorded   Physical Exam: Physical Exam Constitutional:      Appearance: Normal appearance.  HENT:     Head: Normocephalic and atraumatic.     Mouth/Throat:     Pharynx: Oropharynx is clear.  Eyes:     Pupils: Pupils are equal, round, and  reactive to light.  Cardiovascular:     Rate and Rhythm: Normal rate and regular rhythm.  Pulmonary:     Effort: Pulmonary effort is normal.     Breath sounds: Normal breath sounds.  Abdominal:     General: Abdomen is flat.     Palpations: Abdomen is soft.  Musculoskeletal:        General: Normal range of motion.  Skin:    General: Skin is warm and dry.  Neurological:     General: No focal deficit present.     Mental Status: She is alert. Mental status is at baseline.  Psychiatric:        Attention and Perception: Attention and perception normal.        Mood and Affect: Mood is anxious and elated. Affect is labile and flat.        Speech: Speech is rapid and pressured and tangential.        Behavior: Behavior is agitated. Behavior is cooperative.        Thought Content: Thought content is paranoid and delusional.        Cognition and Memory: Cognition and memory normal.        Judgment: Judgment is impulsive and inappropriate.    Review of Systems  Constitutional: Negative.   HENT: Negative.    Eyes: Negative.   Respiratory: Negative.    Cardiovascular: Negative.   Gastrointestinal: Negative.   Genitourinary: Negative.   Musculoskeletal: Negative.   Skin: Negative.   Neurological: Negative.   Endo/Heme/Allergies: Negative.   Psychiatric/Behavioral:  The patient has insomnia.    Blood pressure (!) 140/92, pulse 86, temperature 97.7 F (36.5 C), temperature source Oral, resp. rate 18, height 5\' 2"  (1.575 m), weight 93 kg, SpO2 100 %. Body mass index is 37.49 kg/m.  Treatment Plan Summary: Daily  contact with patient to assess and evaluate symptoms and progress in treatment, Medication management, and Plan restart Haldol and Ingrezza.  Observation Level/Precautions:  15 minute checks  Laboratory:  CBC Chemistry Profile  Psychotherapy:    Medications:    Consultations:    Discharge Concerns:    Estimated LOS:  Other:     Physician Treatment Plan for Primary Diagnosis: Bipolar affective disorder, currently manic, moderate Long Term Goal(s): Improvement in symptoms so as ready for discharge  Short Term Goals: Ability to identify changes in lifestyle to reduce recurrence of condition will improve, Ability to verbalize feelings will improve, Ability to disclose and discuss suicidal ideas, Ability to demonstrate self-control will improve, Ability to identify and develop effective coping behaviors will improve, Ability to maintain clinical measurements within normal limits will improve, Compliance with prescribed medications will improve, and Ability to identify triggers associated with substance abuse/mental health issues will improve  Physician Treatment Plan for Secondary Diagnosis: Principal Problem:   Bipolar affective disorder, currently manic, moderate    I certify that inpatient services furnished can reasonably be expected to improve the patient's condition.    Sarina Ill, DO 4/7/202410:55 AM

## 2022-08-06 NOTE — Plan of Care (Signed)
  Problem: Activity: Goal: Risk for activity intolerance will decrease Outcome: Progressing   Problem: Nutrition: Goal: Adequate nutrition will be maintained Outcome: Progressing   Problem: Coping: Goal: Level of anxiety will decrease Outcome: Progressing   Problem: Safety: Goal: Ability to remain free from injury will improve Outcome: Progressing   
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
## 2022-08-06 NOTE — [facility] Group Notes (Signed)
LCSW Group Therapy Note   08/06/2022 1:15pm   Type of Therapy and Topic:  Group Therapy:  Overcoming Obstacles   Participation Level:  None   Description of Group:    In this group patients will be encouraged to explore what they see as obstacles to their own wellness and recovery. They will be guided to discuss their thoughts, feelings, and behaviors related to these obstacles. The group will process together ways to cope with barriers, with attention given to specific choices patients can make. Each patient will be challenged to identify changes they are motivated to make in order to overcome their obstacles. This group will be process-oriented, with patients participating in exploration of their own experiences as well as giving and receiving support and challenge from other group members.   Therapeutic Goals: Patient will identify personal and current obstacles as they relate to admission. Patient will identify barriers that currently interfere with their wellness or overcoming obstacles.  Patient will identify feelings, thought process and behaviors related to these barriers. Patient will identify two changes they are willing to make to overcome these obstacles:      Summary of Patient Progress: pt did attend group but did not appear to pay attention, had a Bible with her and was looking at that.  At one point she stood up and made a big deal about needing a piece of paper.  CSW asked her about obstacles and she said she didn't have anything to say.      Therapeutic Modalities:   Cognitive Behavioral Therapy Solution Focused Therapy Motivational Interviewing Relapse Prevention Therapy  Lorri Frederick, LCSW 08/06/2022 2:48 PM

## 2022-08-06 NOTE — Progress Notes (Signed)
Pt refused scheduled medications but accepted PRN Zyprexa 10 mg. Pt stated "They are just lying and stealing. Now I have them stuck in a big mud whole. They can't just take it out of the machine and give it to me."
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
## 2022-08-07 DIAGNOSIS — F3112 Bipolar disorder, current episode manic without psychotic features, moderate: Secondary | ICD-10-CM | POA: Diagnosis not present

## 2022-08-07 MED ORDER — LORAZEPAM 0.5 MG PO TABS
0.5000 mg | ORAL_TABLET | Freq: Two times a day (BID) | ORAL | Status: DC
  Administered 2022-08-08 – 2022-08-09 (×3): 0.5 mg via ORAL
  Filled 2022-08-07 (×3): qty 1

## 2022-08-07 NOTE — Plan of Care (Signed)
D- Patient alert and oriented. Patient presented in a pleasant mood on assessment stating that she slept "ok" last night and had no complaints to voice to this Clinical research associate. Patient was lying in bed, only making brief eye contact with this writer, but responding appropriately. Patient denied SI, HI, AVH, and pain at this time. Patient also denied any signs/symptoms of depression and anxiety, stating that overall, she's feeling "pretty good". Patient had no stated goals for today.   A- Patient refused all scheduled medications, stating "that's a lot of medicine, I don't want to take all of that". Support and encouragement provided. Routine safety checks conducted every 15 minutes. Patient informed to notify staff with problems or concerns.  R- Patient contracts for safety at this time. Patient isolates to room, except for meals and goes back to bed. Patient remains safe at this time.  Problem: Education: Goal: Knowledge of General Education information will improve Description: Including pain rating scale, medication(s)/side effects and non-pharmacologic comfort measures Outcome: Not Progressing   Problem: Health Behavior/Discharge Planning: Goal: Ability to manage health-related needs will improve Outcome: Not Progressing   Problem: Clinical Measurements: Goal: Ability to maintain clinical measurements within normal limits will improve Outcome: Not Progressing Goal: Will remain free from infection Outcome: Not Progressing Goal: Diagnostic test results will improve Outcome: Not Progressing Goal: Respiratory complications will improve Outcome: Not Progressing Goal: Cardiovascular complication will be avoided Outcome: Not Progressing   Problem: Activity: Goal: Risk for activity intolerance will decrease Outcome: Not Progressing   Problem: Nutrition: Goal: Adequate nutrition will be maintained Outcome: Not Progressing   Problem: Coping: Goal: Level of anxiety will decrease Outcome: Not  Progressing   Problem: Elimination: Goal: Will not experience complications related to bowel motility Outcome: Not Progressing Goal: Will not experience complications related to urinary retention Outcome: Not Progressing   Problem: Pain Managment: Goal: General experience of comfort will improve Outcome: Not Progressing   Problem: Safety: Goal: Ability to remain free from injury will improve Outcome: Not Progressing   Problem: Skin Integrity: Goal: Risk for impaired skin integrity will decrease Outcome: Not Progressing   Problem: Education: Goal: Knowledge of [place] [facility] Education information/materials will improve Outcome: Not Progressing Goal: Emotional status will improve Outcome: Not Progressing Goal: Mental status will improve Outcome: Not Progressing Goal: Verbalization of understanding the information provided will improve Outcome: Not Progressing   Problem: Safety: Goal: Periods of time without injury will increase Outcome: Not Progressing   Problem: Education: Goal: Will be free of psychotic symptoms Outcome: Not Progressing Goal: Knowledge of the prescribed therapeutic regimen will improve Outcome: Not Progressing

## 2022-08-07 NOTE — Progress Notes (Signed)
Patient continues to refuse medications.   

## 2022-08-07 NOTE — Progress Notes (Signed)
This writer went to get patient for morning medication and she stated "that's a lot of medication, I don't want to take all of that". Asher Muir, NP was notified.

## 2022-08-07 NOTE — BH IP Treatment Plan (Signed)
Interdisciplinary Treatment and Diagnostic Plan Update  08/07/2022 Time of Session: 09:15 Janice Walls MRN: [id number]  Principal Diagnosis: Bipolar affective disorder, currently manic, moderate  Secondary Diagnoses: Principal Problem:   Bipolar affective disorder, currently manic, moderate   Current Medications:  Current Facility-Administered Medications  Medication Dose Route Frequency Provider Last Rate Last Admin   acetaminophen (TYLENOL) tablet 650 mg  650 mg Oral Q6H PRN Charm Rings, NP       alum & mag hydroxide-simeth (MAALOX/MYLANTA) 200-200-20 MG/5ML suspension 30 mL  30 mL Oral Q6H PRN Charm Rings, NP       diphenhydrAMINE (BENADRYL) capsule 50 mg  50 mg Oral BID PRN Charm Rings, NP   50 mg at 08/06/22 2123   Or   diphenhydrAMINE (BENADRYL) injection 50 mg  50 mg Intravenous BID PRN Charm Rings, NP       haloperidol (HALDOL) tablet 5 mg  5 mg Oral BID Sarina Ill, DO       ibuprofen (ADVIL) tablet 600 mg  600 mg Oral Q8H PRN Charm Rings, NP       LORazepam (ATIVAN) tablet 1 mg  1 mg Oral BID Sarina Ill, DO   1 mg at 08/06/22 1222   magnesium hydroxide (MILK OF MAGNESIA) suspension 30 mL  30 mL Oral Daily PRN Charm Rings, NP       metoprolol succinate (TOPROL-XL) 24 hr tablet 50 mg  50 mg Oral Daily Herrick, Richard Edward, DO       nicotine (NICODERM CQ - dosed in mg/24 hours) patch 21 mg  21 mg Transdermal Daily Clapacs, John T, MD       nicotine polacrilex (NICORETTE) gum 2 mg  2 mg Oral PRN Sarina Ill, DO       OLANZapine (ZYPREXA) tablet 10 mg  10 mg Oral BID PRN Charm Rings, NP   10 mg at 08/07/22 0023   Or   OLANZapine (ZYPREXA) injection 10 mg  10 mg Intramuscular BID PRN Charm Rings, NP       ondansetron [facility]) tablet 4 mg  4 mg Oral Q8H PRN Charm Rings, NP       topiramate (TOPAMAX) tablet 75 mg  75 mg Oral Daily Charm Rings, NP   75 mg at 08/06/22 7846   traZODone (DESYREL) tablet  100 mg  100 mg Oral QHS Sarina Ill, DO   100 mg at 08/06/22 2123   valbenazine (INGREZZA) capsule 80 mg  80 mg Oral Daily Sarina Ill, DO       PTA Medications: Medications Prior to Admission  Medication Sig Dispense Refill Last Dose   CAPLYTA 42 MG capsule Take 42 mg by mouth daily. (Patient not taking: Reported on 08/04/2022)      HALDOL DECANOATE 100 MG/ML injection Inject 100 mg into the muscle every 30 (thirty) days. (Patient not taking: Reported on 08/04/2022)      INGREZZA 80 MG capsule Take 80 mg by mouth daily. (Patient not taking: Reported on 08/04/2022)      naloxone [facility]) nasal spray 4 mg/0.1 mL Place 1 spray into the nose as directed. (Patient not taking: Reported on 08/04/2022)      TOPAMAX 25 MG tablet Take 75 mg by mouth daily. (Patient not taking: Reported on 08/04/2022)       Patient Stressors: Marital or family conflict   Medication change or noncompliance    Patient Strengths: Forensic psychologist fund  of knowledge   Treatment Modalities: Medication Management, Group therapy, Case management,  1 to 1 session with clinician, Psychoeducation, Recreational therapy.   Physician Treatment Plan for Primary Diagnosis: Bipolar affective disorder, currently manic, moderate Long Term Goal(s): Improvement in symptoms so as ready for discharge   Short Term Goals: Ability to identify changes in lifestyle to reduce recurrence of condition will improve Ability to verbalize feelings will improve Ability to disclose and discuss suicidal ideas Ability to demonstrate self-control will improve Ability to identify and develop effective coping behaviors will improve Ability to maintain clinical measurements within normal limits will improve Compliance with prescribed medications will improve Ability to identify triggers associated with substance abuse/mental health issues will improve  Medication Management: Evaluate patient's response, side effects, and  tolerance of medication regimen.  Therapeutic Interventions: 1 to 1 sessions, Unit Group sessions and Medication administration.  Evaluation of Outcomes: Not Met  Physician Treatment Plan for Secondary Diagnosis: Principal Problem:   Bipolar affective disorder, currently manic, moderate  Long Term Goal(s): Improvement in symptoms so as ready for discharge   Short Term Goals: Ability to identify changes in lifestyle to reduce recurrence of condition will improve Ability to verbalize feelings will improve Ability to disclose and discuss suicidal ideas Ability to demonstrate self-control will improve Ability to identify and develop effective coping behaviors will improve Ability to maintain clinical measurements within normal limits will improve Compliance with prescribed medications will improve Ability to identify triggers associated with substance abuse/mental health issues will improve     Medication Management: Evaluate patient's response, side effects, and tolerance of medication regimen.  Therapeutic Interventions: 1 to 1 sessions, Unit Group sessions and Medication administration.  Evaluation of Outcomes: Not Met   RN Treatment Plan for Primary Diagnosis: Bipolar affective disorder, currently manic, moderate Long Term Goal(s): Knowledge of disease and therapeutic regimen to maintain health will improve  Short Term Goals: Ability to remain free from injury will improve, Ability to verbalize frustration and anger appropriately will improve, Ability to demonstrate self-control, Ability to participate in decision making will improve, Ability to verbalize feelings will improve, Ability to disclose and discuss suicidal ideas, Ability to identify and develop effective coping behaviors will improve, and Compliance with prescribed medications will improve  Medication Management: RN will administer medications as ordered by provider, will assess and evaluate patient's response and provide  education to patient for prescribed medication. RN will report any adverse and/or side effects to prescribing provider.  Therapeutic Interventions: 1 on 1 counseling sessions, Psychoeducation, Medication administration, Evaluate responses to treatment, Monitor vital signs and CBGs as ordered, Perform/monitor CIWA, COWS, AIMS and Fall Risk screenings as ordered, Perform wound care treatments as ordered.  Evaluation of Outcomes: Not Met   LCSW Treatment Plan for Primary Diagnosis: Bipolar affective disorder, currently manic, moderate Long Term Goal(s): Safe transition to appropriate next level of care at discharge, Engage patient in therapeutic group addressing interpersonal concerns.  Short Term Goals: Engage patient in aftercare planning with referrals and resources, Increase social support, Increase ability to appropriately verbalize feelings, Increase emotional regulation, Facilitate acceptance of mental health diagnosis and concerns, and Increase skills for wellness and recovery  Therapeutic Interventions: Assess for all discharge needs, 1 to 1 time with Social worker, Explore available resources and support systems, Assess for adequacy in community support network, Educate family and significant other(s) on suicide prevention, Complete Psychosocial Assessment, Interpersonal group therapy.  Evaluation of Outcomes: Not Met   Progress in Treatment: Attending groups: Yes. and No. Participating  in groups: No. Taking medication as prescribed: Yes. Toleration medication: Yes. Family/Significant other contact made: Yes, individual(s) contacted:  mother/legal guardian, [name]. Patient understands diagnosis: Yes. Discussing patient identified problems/goals with staff: No. Medical problems stabilized or resolved: Yes. Denies suicidal/homicidal ideation: No. Issues/concerns per patient self-inventory: No. Other: none.  New problem(s) identified: No, Describe:  none identified.  New  Short Term/Long Term Goal(s): elimination of symptoms of psychosis, medication management for mood stabilization; elimination of SI thoughts; development of comprehensive mental wellness plan.  Patient Goals:  Patient declined to attend treatment team meeting despite personal invitation by staff nurse.  Discharge Plan or Barriers: CSW will assist pt with development of an appropriate aftercare/discharge plan.   Reason for Continuation of Hospitalization: Delusions  Depression Hallucinations Medication stabilization  Estimated Length of Stay: 1-7 days  Last 3 [place] Suicide Severity Risk Score: Flowsheet Row Admission (Current) [facility]  C-SSRS RISK CATEGORY No Risk No Risk No Risk       Last PHQ 2/9 Scores:     No data to display          Scribe for Treatment Team: Glenis Smokerarl R Encarnacion Scioneaux, LCSW 08/07/2022 10:11 AM

## 2022-08-07 NOTE — Group Note (Signed)
[facility] LCSW Group Therapy Note   Group Date: 08/07/2022 Start Time: 1300 End Time: 1400   Type of Therapy/Topic:  Group Therapy:  Balance in Life  Participation Level:  Did Not Attend   Description of Group:    This group will address the concept of balance and how it feels and looks when one is unbalanced. Patients will be encouraged to process areas in their lives that are out of balance, and identify reasons for remaining unbalanced. Facilitators will guide patients utilizing problem- solving interventions to address and correct the stressor making their life unbalanced. Understanding and applying boundaries will be explored and addressed for obtaining  and maintaining a balanced life. Patients will be encouraged to explore ways to assertively make their unbalanced needs known to significant others in their lives, using other group members and facilitator for support and feedback.  Therapeutic Goals: Patient will identify two or more emotions or situations they have that consume much of in their lives. Patient will identify signs/triggers that life has become out of balance:  Patient will identify two ways to set boundaries in order to achieve balance in their lives:  Patient will demonstrate ability to communicate their needs through discussion and/or role plays  Summary of Patient Progress: X   Therapeutic Modalities:   Cognitive Behavioral Therapy Solution-Focused Therapy Assertiveness Training   Glenis Smoker, LCSW

## 2022-08-07 NOTE — Progress Notes (Signed)
[facility] MD Progress Note  08/07/2022 3:42 PM Janice Walls  MRN:  [id number]  Subjective:  37 yo female seen for follow up related to mania, bipolar d/o.  She was in bed most of the day.  On assessment, she awakened and continued to minimize everything.  Depression was none along with anxiety and psychosis.  Sleep last night was "ok", appetite "ok", minimal answers.  Will evaluate her medications tomorrow to adjust if she continues to be drowsy, mania symptoms over the weekend.  Principal Problem: Bipolar affective disorder, currently manic, moderate Diagnosis: Principal Problem:   Bipolar affective disorder, currently manic, moderate  Total Time spent with patient: 30 minutes  Past Psychiatric History: bipolar disorder  Past Medical History:  Past Medical History:  Diagnosis Date   Anxiety    Bipolar 1 disorder    Depression    Insomnia    Substance abuse     Past Surgical History:  Procedure Laterality Date   CESAREAN SECTION     CESAREAN SECTION     Family History:  Family History  Problem Relation Age of Onset   CAD Father 91   Family Psychiatric  History: none Social History:  Social History   Substance and Sexual Activity  Alcohol Use Not Currently   Comment: rare     Social History   Substance and Sexual Activity  Drug Use Not Currently   Types: Marijuana, Methamphetamines    Social History   Socioeconomic History   Marital status: Single    Spouse name: Not on file   Number of children: Not on file   Years of education: Not on file   Highest education level: Not on file  Occupational History   Occupation: unemployed  Tobacco Use   Smoking status: Every Day    Packs/day: 1.00    Years: 18.00    Additional pack years: 0.00    Total pack years: 18.00    Types: Cigarettes   Smokeless tobacco: Never  Vaping Use   Vaping Use: Never used  Substance and Sexual Activity   Alcohol use: Not Currently    Comment: rare   Drug use: Not Currently     Types: Marijuana, Methamphetamines   Sexual activity: Yes    Birth control/protection: None  Other Topics Concern   Not on file  Social History Narrative   Not on file   Social Determinants of Health   Financial Resource Strain: Not on file  Food Insecurity: No Food Insecurity (08/05/2022)   Hunger Vital Sign    Worried About Running Out of Food in the Last Year: Never true    Ran Out of Food in the Last Year: Never true  Transportation Needs: No Transportation Needs (08/05/2022)   PRAPARE - Administrator, Civil Service (Medical): No    Lack of Transportation (Non-Medical): No  Physical Activity: Not on file  Stress: Not on file  Social Connections: Not on file   Additional Social History:                         Sleep: Fair  Appetite:  Fair  Current Medications: Current Facility-Administered Medications  Medication Dose Route Frequency Provider Last Rate Last Admin   acetaminophen (TYLENOL) tablet 650 mg  650 mg Oral Q6H PRN Charm Rings, NP       alum & mag hydroxide-simeth (MAALOX/MYLANTA) 200-200-20 MG/5ML suspension 30 mL  30 mL Oral Q6H PRN Charm Rings, NP  diphenhydrAMINE (BENADRYL) capsule 50 mg  50 mg Oral BID PRN Charm Rings, NP   50 mg at 08/06/22 2123   Or   diphenhydrAMINE (BENADRYL) injection 50 mg  50 mg Intravenous BID PRN Charm Rings, NP       haloperidol (HALDOL) tablet 5 mg  5 mg Oral BID Sarina Ill, DO       ibuprofen (ADVIL) tablet 600 mg  600 mg Oral Q8H PRN Charm Rings, NP       LORazepam (ATIVAN) tablet 1 mg  1 mg Oral BID Sarina Ill, DO   1 mg at 08/06/22 1222   magnesium hydroxide (MILK OF MAGNESIA) suspension 30 mL  30 mL Oral Daily PRN Charm Rings, NP       metoprolol succinate (TOPROL-XL) 24 hr tablet 50 mg  50 mg Oral Daily Herrick, Richard Edward, DO       nicotine (NICODERM CQ - dosed in mg/24 hours) patch 21 mg  21 mg Transdermal Daily Clapacs, John T, MD       nicotine  polacrilex (NICORETTE) gum 2 mg  2 mg Oral PRN Sarina Ill, DO       OLANZapine [facility]) tablet 10 mg  10 mg Oral BID PRN Charm Rings, NP   10 mg at 08/07/22 0023   Or   OLANZapine (ZYPREXA) injection 10 mg  10 mg Intramuscular BID PRN Charm Rings, NP       ondansetron [facility]) tablet 4 mg  4 mg Oral Q8H PRN Charm Rings, NP       topiramate (TOPAMAX) tablet 75 mg  75 mg Oral Daily Charm Rings, NP   75 mg at 08/06/22 2426   traZODone (DESYREL) tablet 100 mg  100 mg Oral QHS Sarina Ill, DO   100 mg at 08/06/22 2123   valbenazine (INGREZZA) capsule 80 mg  80 mg Oral Daily Sarina Ill, DO        Lab Results: No results found for this or any previous visit (from the past 48 hour(s)).  Blood Alcohol level:  Lab Results  Component Value Date   ETH <10 08/04/2022   ETH <10 03/22/2021    Metabolic Disorder Labs: Lab Results  Component Value Date   HGBA1C 4.8 09/12/2016   MPG 91 09/12/2016   Lab Results  Component Value Date   PROLACTIN 37.4 (H) 09/12/2016   Lab Results  Component Value Date   CHOL 130 09/12/2016   TRIG 79 09/12/2016   HDL 41 09/12/2016   CHOLHDL 3.2 09/12/2016   VLDL 16 09/12/2016   LDLCALC 73 09/12/2016    Physical Findings: AIMS:  , ,  ,  ,    CIWA:    COWS:     Musculoskeletal: Strength & Muscle Tone: within normal limits Gait & Station: normal Patient leans: N/A  Psychiatric Specialty Exam: Physical Exam Vitals and nursing note reviewed.  Constitutional:      Appearance: Normal appearance.  HENT:     Head: Normocephalic.     Nose: Nose normal.  Pulmonary:     Effort: Pulmonary effort is normal.  Musculoskeletal:        General: Normal range of motion.     Cervical back: Normal range of motion.  Neurological:     General: No focal deficit present.     Mental Status: She is alert and oriented to person, place, and time.  Psychiatric:  Attention and Perception: She is inattentive.         Mood and Affect: Affect is blunt.        Speech: Speech normal.        Behavior: Behavior normal. Behavior is cooperative.        Thought Content: Thought content normal.        Cognition and Memory: Cognition and memory normal.        Judgment: Judgment normal.     Review of Systems  All other systems reviewed and are negative.   Blood pressure (!) 140/92, pulse 86, temperature 97.7 F (36.5 C), temperature source Oral, resp. rate 18, height 5\' 2"  (1.575 m), weight 93 kg, SpO2 100 %.Body mass index is 37.49 kg/m.  General Appearance: Disheveled  Eye Contact:  Fair  Speech:  Normal Rate  Volume:  Normal  Mood:  Euthymic  Affect:  Blunt  Thought Process:  Coherent  Orientation:  Full (Time, Place, and Person)  Thought Content:  Logical  Suicidal Thoughts:  No  Homicidal Thoughts:  No  Memory:  Immediate;   Fair Recent;   Fair Remote;   Fair  Judgement:  Fair  Insight:  Lacking  Psychomotor Activity:  Decreased  Concentration:  Concentration: Fair and Attention Span: Fair  Recall:  [employer]  Fund of Knowledge:  Fair  Language:  Fair  Akathisia:  No  Handed:  Right  AIMS (if indicated):     Assets:  Leisure Time Physical Health Resilience Social Support  ADL's:  Intact  Cognition:  WNL  Sleep:         Physical Exam: Physical Exam Vitals and nursing note reviewed.  Constitutional:      Appearance: Normal appearance.  HENT:     Head: Normocephalic.     Nose: Nose normal.  Pulmonary:     Effort: Pulmonary effort is normal.  Musculoskeletal:        General: Normal range of motion.     Cervical back: Normal range of motion.  Neurological:     General: No focal deficit present.     Mental Status: She is alert and oriented to person, place, and time.  Psychiatric:        Attention and Perception: She is inattentive.        Mood and Affect: Affect is blunt.        Speech: Speech normal.        Behavior: Behavior normal. Behavior is cooperative.         Thought Content: Thought content normal.        Cognition and Memory: Cognition and memory normal.        Judgment: Judgment normal.    Review of Systems  All other systems reviewed and are negative.  Blood pressure (!) 140/92, pulse 86, temperature 97.7 F (36.5 C), temperature source Oral, resp. rate 18, height 5\' 2"  (1.575 m), weight 93 kg, SpO2 100 %. Body mass index is 37.49 kg/m.   Treatment Plan Summary: Daily contact with patient to assess and evaluate symptoms and progress in treatment, Medication management, and Plan : Bipolar I disorder, mania: Haldol 5 mg BID Topamax 75 mg daily  Anxiety: Ativan 1 mg BID reduced to 0.5 mg BID  Insomnia: Trazodone 100 mg daily  TD: Ingrezza 80 mg daily  Nanine MeansJamison Adrina Armijo, NP 08/07/2022, 3:42 PM

## 2022-08-07 NOTE — Group Note (Signed)
Recreation Therapy Group Note   Group Topic:Healthy Support Systems  Group Date: 08/07/2022 Start Time: 1000 End Time: 1100 Facilitators: Rosina Lowenstein, LRT, CTRS Location:  Craft Room  Group Description: Straw Bridge. Patients were given 10 plastic drinking straws and an equal length of masking tape. Using the materials provided, patients were instructed to build a free-standing bridge-like structure to suspend an everyday item (ex: deck of cards) off the floor or table surface. All materials were required to be used in Secondary school teacher. LRT facilitated post-activity discussion reviewing the importance of having strong and healthy support systems in our lives. LRT discussed how the people in our lives serve as the tape and the book we placed on top of our straw structure are the stressors we face in daily life. LRT and pts discussed what happens in our life when things get too heavy for [place], and we don't have strong supports outside of the hospital. Pt shared 2 of their healthy supports aloud in the group.   Affect/Mood: N/A   Participation Level: Did not attend    Clinical Observations/Individualized Feedback: Patient did not attend group due to resting in her room.  Plan: Continue to engage patient in RT group sessions 2-3x/week.   Rosina Lowenstein, LRT, CTRS 08/07/2022 11:58 AM

## 2022-08-08 DIAGNOSIS — F3112 Bipolar disorder, current episode manic without psychotic features, moderate: Secondary | ICD-10-CM | POA: Diagnosis not present

## 2022-08-08 NOTE — Group Note (Signed)
LCSW Group Therapy Note  Group Date: 08/08/2022 Start Time: 1300 End Time: 1400   Type of Therapy and Topic:  Group Therapy - Healthy vs Unhealthy Coping Skills  Participation Level:  Did Not Attend   Description of Group The focus of this group was to determine what unhealthy coping techniques typically are used by group members and what healthy coping techniques would be helpful in coping with various problems. Patients were guided in becoming aware of the differences between healthy and unhealthy coping techniques. Patients were asked to identify 2-3 healthy coping skills they would like to learn to use more effectively.  Therapeutic Goals Patients learned that coping is what human beings do all day long to deal with various situations in their lives Patients defined and discussed healthy vs unhealthy coping techniques Patients identified their preferred coping techniques and identified whether these were healthy or unhealthy Patients determined 2-3 healthy coping skills they would like to become more familiar with and use more often. Patients provided support and ideas to each other   Summary of Patient Progress:  Patient did not attend group despite encouraged participation.     Therapeutic Modalities Cognitive Behavioral Therapy Motivational Interviewing  Almedia Balls 08/08/2022  3:28 PM

## 2022-08-08 NOTE — Group Note (Signed)
Date:  08/08/2022 Time:  6:57 PM  Group Topic/Focus:  Goals Group and Activity Group    Participation Level:  Did Not Attend   Lynelle [facility] 08/08/2022, 6:57 PM

## 2022-08-08 NOTE — Progress Notes (Signed)
D- Patient alert and oriented x 3-4. Affect blank/mood anxious and irritable. Denies SI/ HI/ AVH. She denies depression or anxiety. She denies pain. She states she wants "her mother to come visit her so she can go home". A- Scheduled medications administered to patient, per MD orders. Support and encouragement provided.  Routine safety checks conducted every 15 minutes.  Patient informed to notify staff with problems or concerns. R- No adverse drug reactions noted. Patient compliant with medications and treatment plan. Patient verbally loud but cooperative. Patient isolative to room except for meals and phone calls. Patient contracts for safety and remains safe on the unit at this time.
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
## 2022-08-08 NOTE — Progress Notes (Signed)
[facility]  08/08/2022 10:49 AM Janice DurandJennifer Walls  MRN:  [id number]  Subjective:  37 yo female seen for follow up related to mania, bipolar d/o.  She was in bed during the assessment, laying there prior to admission.  Sleep was "good", appetite "ok".  Depression was none along with anxiety and psychosis.  She refused medications this morning, will continue to monitor.  Principal Problem: Bipolar affective disorder, currently manic, moderate Diagnosis: Principal Problem:   Bipolar affective disorder, currently manic, moderate  Total Time spent with patient: 30 minutes  Past Psychiatric History: bipolar disorder  Past Medical History:  Past Medical History:  Diagnosis Date   Anxiety    Bipolar 1 disorder    Depression    Insomnia    Substance abuse     Past Surgical History:  Procedure Laterality Date   CESAREAN SECTION     CESAREAN SECTION     Family History:  Family History  Problem Relation Age of Onset   CAD Father [id number]   Family Psychiatric  History: none Social History:  Social History   Substance and Sexual Activity  Alcohol Use Not Currently   Comment: rare     Social History   Substance and Sexual Activity  Drug Use Not Currently   Types: Marijuana, Methamphetamines    Social History   Socioeconomic History   Marital status: Single    Spouse name: Not on file   Number of children: Not on file   Years of education: Not on file   Highest education level: Not on file  Occupational History   Occupation: unemployed  Tobacco Use   Smoking status: Every Day    Packs/day: 1.00    Years: 18.00    Additional pack years: 0.00    Total pack years: 18.00    Types: Cigarettes   Smokeless tobacco: Never  Vaping Use   Vaping Use: Never used  Substance and Sexual Activity   Alcohol use: Not Currently    Comment: rare   Drug use: Not Currently    Types: Marijuana, Methamphetamines   Sexual activity: Yes    Birth control/protection: None  Other  Topics Concern   Not on file  Social History Narrative   Not on file   Social Determinants of Health   Financial Resource Strain: Not on file  Food Insecurity: No Food Insecurity (08/05/2022)   Hunger Vital Sign    Worried About Running Out of Food in the Last Year: Never true    Ran Out of Food in the Last Year: Never true  Transportation Needs: No Transportation Needs (08/05/2022)   PRAPARE - Administrator, Civil ServiceTransportation    Lack of Transportation (Medical): No    Lack of Transportation (Non-Medical): No  Physical Activity: Not on file  Stress: Not on file  Social Connections: Not on file   Additional Social History:                         Sleep: Fair  Appetite:  Fair  Current Medications: Current Facility-Administered Medications  Medication Dose Route Frequency Provider Last Rate Last Admin   acetaminophen (TYLENOL) tablet 650 mg  650 mg Oral Q6H PRN Charm RingsLord, Mykael Batz Y, NP       alum & mag hydroxide-simeth (MAALOX/MYLANTA) 200-200-20 MG/5ML suspension 30 mL  30 mL Oral Q6H PRN Charm RingsLord, Goldie Dimmer Y, NP       diphenhydrAMINE (BENADRYL) capsule 50 mg  50 mg Oral BID PRN Charm RingsLord, Colen Eltzroth Y,  NP   50 mg at 08/06/22 2123   Or   diphenhydrAMINE (BENADRYL) injection 50 mg  50 mg Intravenous BID PRN Charm Rings, NP       haloperidol (HALDOL) tablet 5 mg  5 mg Oral BID Sarina Ill, DO       ibuprofen (ADVIL) tablet 600 mg  600 mg Oral Q8H PRN Charm Rings, NP       LORazepam (ATIVAN) tablet 0.5 mg  0.5 mg Oral BID Charm Rings, NP       magnesium hydroxide (MILK OF MAGNESIA) suspension 30 mL  30 mL Oral Daily PRN Charm Rings, NP       metoprolol succinate (TOPROL-XL) 24 hr tablet 50 mg  50 mg Oral Daily Herrick, Richard Edward, DO       nicotine (NICODERM CQ - dosed in mg/24 hours) patch 21 mg  21 mg Transdermal Daily Clapacs, John T, MD       nicotine polacrilex (NICORETTE) gum 2 mg  2 mg Oral PRN Sarina Ill, DO       OLANZapine [facility]) tablet 10 mg  10 mg  Oral BID PRN Charm Rings, NP   10 mg at 08/07/22 0023   Or   OLANZapine (ZYPREXA) injection 10 mg  10 mg Intramuscular BID PRN Charm Rings, NP       ondansetron [facility]) tablet 4 mg  4 mg Oral Q8H PRN Charm Rings, NP       topiramate (TOPAMAX) tablet 75 mg  75 mg Oral Daily Charm Rings, NP   75 mg at 08/06/22 8786   traZODone (DESYREL) tablet 100 mg  100 mg Oral QHS Sarina Ill, DO   100 mg at 08/06/22 2123   valbenazine (INGREZZA) capsule 80 mg  80 mg Oral Daily Sarina Ill, DO        Lab Results: No results found for this or any previous visit (from the past 48 hour(s)).  Blood Alcohol level:  Lab Results  Component Value Date   ETH <10 08/04/2022   ETH <10 03/22/2021    Metabolic Disorder Labs: Lab Results  Component Value Date   HGBA1C 4.8 09/12/2016   MPG 91 09/12/2016   Lab Results  Component Value Date   PROLACTIN 37.4 (H) 09/12/2016   Lab Results  Component Value Date   CHOL 130 09/12/2016   TRIG 79 09/12/2016   HDL 41 09/12/2016   CHOLHDL 3.2 09/12/2016   VLDL 16 09/12/2016   LDLCALC 73 09/12/2016    Physical Findings: AIMS:  , ,  ,  ,    CIWA:    COWS:     Musculoskeletal: Strength & Muscle Tone: within normal limits Gait & Station: normal Patient leans: N/A  Psychiatric Specialty Exam: Physical Exam Vitals and nursing note reviewed.  Constitutional:      Appearance: Normal appearance.  HENT:     Head: Normocephalic.     Nose: Nose normal.  Pulmonary:     Effort: Pulmonary effort is normal.  Musculoskeletal:        General: Normal range of motion.     Cervical back: Normal range of motion.  Neurological:     General: No focal deficit present.     Mental Status: She is alert and oriented to person, place, and time.  Psychiatric:        Attention and Perception: She is inattentive.        Mood and Affect:  Affect is blunt.        Speech: Speech normal.        Behavior: Behavior normal. Behavior is  cooperative.        Thought Content: Thought content normal.        Cognition and Memory: Cognition and memory normal.        Judgment: Judgment normal.     Review of Systems  All other systems reviewed and are negative.   Blood pressure 104/62, pulse (!) 59, temperature 97.7 F (36.5 C), temperature source Oral, resp. rate 20, height 5\' 2"  (1.575 m), weight 93 kg, SpO2 98 %.Body mass index is 37.49 kg/m.  General Appearance: Disheveled  Eye Contact:  Fair  Speech:  Normal Rate  Volume:  Normal  Mood:  Euthymic  Affect:  Blunt  Thought Process:  Coherent  Orientation:  Full (Time, Place, and Person)  Thought Content:  Logical  Suicidal Thoughts:  No  Homicidal Thoughts:  No  Memory:  Immediate;   Fair Recent;   Fair Remote;   Fair  Judgement:  Fair  Insight:  Lacking  Psychomotor Activity:  Decreased  Concentration:  Concentration: Fair and Attention Span: Fair  Recall:  [employer] of Knowledge:  Fair  Language:  Fair  Akathisia:  No  Handed:  Right  AIMS (if indicated):     Assets:  Leisure Time Physical Health Resilience Social Support  ADL's:  Intact  Cognition:  WNL  Sleep:         Physical Exam: Physical Exam Vitals and nursing note reviewed.  Constitutional:      Appearance: Normal appearance.  HENT:     Head: Normocephalic.     Nose: Nose normal.  Pulmonary:     Effort: Pulmonary effort is normal.  Musculoskeletal:        General: Normal range of motion.     Cervical back: Normal range of motion.  Neurological:     General: No focal deficit present.     Mental Status: She is alert and oriented to person, place, and time.  Psychiatric:        Attention and Perception: She is inattentive.        Mood and Affect: Affect is blunt.        Speech: Speech normal.        Behavior: Behavior normal. Behavior is cooperative.        Thought Content: Thought content normal.        Cognition and Memory: Cognition and memory normal.        Judgment:  Judgment normal.    Review of Systems  All other systems reviewed and are negative.  Blood pressure 104/62, pulse (!) 59, temperature 97.7 F (36.5 C), temperature source Oral, resp. rate 20, height 5\' 2"  (1.575 m), weight 93 kg, SpO2 98 %. Body mass index is 37.49 kg/m.   Treatment Plan Summary: Daily contact with patient to assess and evaluate symptoms and progress in treatment, Medication management, and Plan : Bipolar I disorder, mania: Haldol 5 mg BID Topamax 75 mg daily  Anxiety: Ativan 1 mg BID reduced to 0.5 mg BID  Insomnia: Trazodone 100 mg daily  TD: Ingrezza 80 mg daily  Nanine Means, NP 08/08/2022, 10:49 AM

## 2022-08-08 NOTE — Plan of Care (Signed)
  Problem: Education: Goal: Knowledge of General Education information will improve Description: Including pain rating scale, medication(s)/side effects and non-pharmacologic comfort measures Outcome: Not Progressing   Problem: Health Behavior/Discharge Planning: Goal: Ability to manage health-related needs will improve Outcome: Not Progressing   Problem: Clinical Measurements: Goal: Ability to maintain clinical measurements within normal limits will improve Outcome: Not Progressing Goal: Will remain free from infection Outcome: Not Progressing Goal: Diagnostic test results will improve Outcome: Not Progressing Goal: Respiratory complications will improve Outcome: Not Progressing Goal: Cardiovascular complication will be avoided Outcome: Not Progressing   Problem: Activity: Goal: Risk for activity intolerance will decrease Outcome: Not Progressing   Problem: Nutrition: Goal: Adequate nutrition will be maintained Outcome: Not Progressing   Problem: Coping: Goal: Level of anxiety will decrease Outcome: Not Progressing   Problem: Elimination: Goal: Will not experience complications related to bowel motility Outcome: Not Progressing Goal: Will not experience complications related to urinary retention Outcome: Not Progressing   Problem: Pain Managment: Goal: General experience of comfort will improve Outcome: Not Progressing   Problem: Safety: Goal: Ability to remain free from injury will improve Outcome: Not Progressing   Problem: Skin Integrity: Goal: Risk for impaired skin integrity will decrease Outcome: Not Progressing   Problem: Education: Goal: Knowledge of [place] [facility] Education information/materials will improve Outcome: Not Progressing Goal: Emotional status will improve Outcome: Not Progressing Goal: Mental status will improve Outcome: Not Progressing Goal: Verbalization of understanding the information provided will improve Outcome: Not  Progressing   Problem: Safety: Goal: Periods of time without injury will increase Outcome: Not Progressing   Problem: Education: Goal: Will be free of psychotic symptoms Outcome: Not Progressing Goal: Knowledge of the prescribed therapeutic regimen will improve Outcome: Not Progressing
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
## 2022-08-08 NOTE — Progress Notes (Signed)
Janice Walls has been tearful this shift with disorganized thought compliant with medications prn Zyprexa given and effective. Q 15 minutes safety checks ongoing Patient remains safe,

## 2022-08-08 NOTE — Group Note (Signed)
Recreation Therapy Group Note   Group Topic:Relaxation  Group Date: 08/08/2022 Start Time: 1000 End Time: 1050 Facilitators: Rosina Lowenstein, LRT, CTRS Location:  [facility]  Group Description: Meditation. LRT asks patients their current level of stress/anxiety from 1-10, with 10 being the highest. LRT educated on the benefits of mindfulness and how it can apply to everyday life post-discharge. LRT and pt's followed along to an audio script of a "guided meditation" video. LRT asked pt their level of stress and anxiety once the prompt was finished. LRT facilitated post-activity processing to gain feedback on session.  Affect/Mood: N/A   Participation Level: Did not attend    Clinical Observations/Individualized Feedback: Clover did not attend group due to resting in her room.  Plan: Continue to engage patient in RT group sessions 2-3x/week.   Rosina Lowenstein, LRT, CTRS 08/08/2022 11:49 AM

## 2022-08-09 DIAGNOSIS — F3112 Bipolar disorder, current episode manic without psychotic features, moderate: Secondary | ICD-10-CM | POA: Diagnosis not present

## 2022-08-09 MED ORDER — ARIPIPRAZOLE ER 400 MG IM SRER
400.0000 mg | INTRAMUSCULAR | Status: DC
  Administered 2022-08-09: 400 mg via INTRAMUSCULAR
  Filled 2022-08-09: qty 2

## 2022-08-09 MED ORDER — ARIPIPRAZOLE 5 MG PO TABS
15.0000 mg | ORAL_TABLET | Freq: Every day | ORAL | Status: DC
  Administered 2022-08-09 – 2022-08-15 (×7): 15 mg via ORAL
  Filled 2022-08-09 (×7): qty 1

## 2022-08-09 NOTE — Plan of Care (Signed)
D: Patient alert and oriented. Patient denies pain. Patient denies anxiety and depression. Patient denies SI/HI. Patient does endorse hearing voices of family members that she misses. Patient isolative to room with exception to coming out for meals and medication.  A: Scheduled medications administered to patient, per MD orders.  Support and encouragement provided to patient.  Q15 minute safety checks maintained.   R: Patient compliant with medication administration and treatment plan. No adverse drug reactions noted. Patient remains safe on the unit at this time. Problem: Education: Goal: Knowledge of [place] [facility] Education information/materials will improve Outcome: Progressing Goal: Verbalization of understanding the information provided will improve Outcome: Progressing   Problem: Safety: Goal: Periods of time without injury will increase Outcome: Progressing

## 2022-08-09 NOTE — Progress Notes (Signed)
Patient presents with bizarre behaviors handing the nurse a note stating, "I wanted to let it be known they had a hack in them the movers and I was trying to be in them." Patient denies SI/HI/AVH. Pt observed interacting appropriately with staff and peers on the unit. Pt given education, support, and encouragement to be active in her treatment plan. Pt being monitored Q 15 minutes for safety per unit protocol, remains safe on the unit

## 2022-08-09 NOTE — Group Note (Signed)
Date:  08/09/2022 Time:  9:49 AM  Group Topic/Focus:  Community Meeting    Participation Level:  Did Not Attend   [name] [name] 08/09/2022, 9:49 AM  

## 2022-08-09 NOTE — Group Note (Signed)
Recreation Therapy Group Note   Group Topic:Problem Solving  Group Date: 08/09/2022 Start Time: 1000 End Time: 1100 Facilitators: Rosina Lowenstein, LRT, CTRS Location:  [facility] Room  Group Description: Life Boat. Patients were given the scenario that they are on a boat that is about to become shipwrecked, leaving them stranded on an [place]. They are asked to make a list of 15 different items that they want to take with them when they are stranded on the [place]. Patients are asked to rank their items from most important to least important, #1 being the most important and #15 being the least. Patients will work individually for the first round to come up with 15 items and then pair up with a peer(s) to condense their list and come up with one list of 10 items between the two of them. Patients or LRT will read aloud the 15 different items to the group after each round. LRT facilitated post-activity processing to discuss how this activity can be used in daily life post discharge.   Affect/Mood: N/A   Participation Level: Did not attend    Clinical Observations/Individualized Feedback: Chasidi did not attend group due to resting in her room.  Plan: Continue to engage patient in RT group sessions 2-3x/week.   Rosina Lowenstein, LRT, CTRS 08/09/2022 11:31 AM

## 2022-08-09 NOTE — Progress Notes (Signed)
[facility]  08/09/2022 2:23 PM Glenna DurandJennifer Strada  MRN:  [id number] Subjective: Follow-up 37 year old woman with bipolar disorder.  Patient seen and chart reviewed.  Long history of bipolar disorder with presentations in the past of both manic and depressive symptoms.  Came to the hospital this time with what sounds like manic symptoms running around outside naked hitting people with the Bible etc.  On interview today the patient was able to get up out of bed come sit in the office and be cooperative.  She tells me that she sensed a demon inside her stepfather.  The rest of her history is also a bit disorganized and she has little insight into why she is in the hospital.  Judges that she has not been working with her ACT team regularly does not know when she last got her shot.  She denies any recent substance abuse saying she has stopped using methamphetamines and other drugs.  Drug screen was in fact negative. 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 multiple previous hospitalizations  Past Medical History:  Past Medical History:  Diagnosis Date   Anxiety    Bipolar 1 disorder    Depression    Insomnia    Substance abuse     Past Surgical History:  Procedure Laterality Date   CESAREAN SECTION     CESAREAN SECTION     Family History:  Family History  Problem Relation Age of Onset   CAD Father 4646   Family Psychiatric  History: See previous Social History:  Social History   Substance and Sexual Activity  Alcohol Use Not Currently   Comment: rare     Social History   Substance and Sexual Activity  Drug Use Not Currently   Types: Marijuana, Methamphetamines    Social History   Socioeconomic  History   Marital status: Single    Spouse name: Not on file   Number of children: Not on file   Years of education: Not on file   Highest education level: Not on file  Occupational History   Occupation: unemployed  Tobacco Use   Smoking status: Every Day    Packs/day: 1.00    Years: 18.00    Additional pack years: 0.00    Total pack years: 18.00    Types: Cigarettes   Smokeless tobacco: Never  Vaping Use   Vaping Use: Never used  Substance and Sexual Activity   Alcohol use: Not Currently    Comment: rare   Drug use: Not Currently    Types: Marijuana, Methamphetamines   Sexual activity: Yes    Birth control/protection: None  Other Topics Concern   Not on file  Social History Narrative   Not on file   Social Determinants of Health   Financial Resource Strain: Not on file  Food Insecurity: No Food Insecurity (08/05/2022)   Hunger Vital Sign    Worried About Running Out of Food in the Last Year: Never true    Ran Out of Food in the Last Year: Never true  Transportation Needs: No Transportation Needs (08/05/2022)   PRAPARE - Administrator, Civil ServiceTransportation    Lack of Transportation (Medical): No    Lack of Transportation (Non-Medical): No  Physical Activity: Not on file  Stress: Not on file  Social Connections: Not on  file   Additional Social History:                         Sleep: Fair  Appetite:  Fair  Current Medications: Current Facility-Administered Medications  Medication Dose Route Frequency Provider Last Rate Last Admin   acetaminophen (TYLENOL) tablet 650 mg  650 mg Oral Q6H PRN Charm Rings, NP       alum & mag hydroxide-simeth (MAALOX/MYLANTA) 200-200-20 MG/5ML suspension 30 mL  30 mL Oral Q6H PRN Charm Rings, NP   30 mL at 08/08/22 1846   ARIPiprazole (ABILIFY) tablet 15 mg  15 mg Oral Daily Cambrea Kirt, Jackquline Denmark, MD       ARIPiprazole ER (ABILIFY MAINTENA) injection 400 mg  400 mg Intramuscular Q28 days Saida Lonon, Jackquline Denmark, MD       diphenhydrAMINE (BENADRYL)  capsule 50 mg  50 mg Oral BID PRN Charm Rings, NP   50 mg at 08/06/22 2123   Or   diphenhydrAMINE (BENADRYL) injection 50 mg  50 mg Intravenous BID PRN Charm Rings, NP       ibuprofen (ADVIL) tablet 600 mg  600 mg Oral Q8H PRN Charm Rings, NP       magnesium hydroxide (MILK OF MAGNESIA) suspension 30 mL  30 mL Oral Daily PRN Charm Rings, NP       metoprolol succinate (TOPROL-XL) 24 hr tablet 50 mg  50 mg Oral Daily Sarina Ill, DO   50 mg at 08/09/22 1309   nicotine (NICODERM CQ - dosed in mg/24 hours) patch 21 mg  21 mg Transdermal Daily Azyria Osmon, Jackquline Denmark, MD       nicotine polacrilex (NICORETTE) gum 2 mg  2 mg Oral PRN Sarina Ill, DO       OLANZapine [facility]) tablet 10 mg  10 mg Oral BID PRN Charm Rings, NP   10 mg at 08/08/22 2117   Or   OLANZapine (ZYPREXA) injection 10 mg  10 mg Intramuscular BID PRN Charm Rings, NP       ondansetron [facility]) tablet 4 mg  4 mg Oral Q8H PRN Charm Rings, NP       topiramate (TOPAMAX) tablet 75 mg  75 mg Oral Daily Charm Rings, NP   75 mg at 08/09/22 1309   valbenazine (INGREZZA) capsule 80 mg  80 mg Oral Daily Sarina Ill, DO   80 mg at 08/09/22 1309    Lab Results: No results found for this or any previous visit (from the past 48 hour(s)).  Blood Alcohol level:  Lab Results  Component Value Date   ETH <10 08/04/2022   ETH <10 03/22/2021    Metabolic Disorder Labs: Lab Results  Component Value Date   HGBA1C 4.8 09/12/2016   MPG 91 09/12/2016   Lab Results  Component Value Date   PROLACTIN 37.4 (H) 09/12/2016   Lab Results  Component Value Date   CHOL 130 09/12/2016   TRIG 79 09/12/2016   HDL 41 09/12/2016   CHOLHDL 3.2 09/12/2016   VLDL 16 09/12/2016   LDLCALC 73 09/12/2016    Physical Findings: AIMS:  , ,  ,  ,    CIWA:    COWS:     Musculoskeletal: Strength & Muscle Tone: within normal limits Gait & Station: normal Patient leans: N/A  Psychiatric Specialty  Exam:  Presentation  General Appearance: No data recorded Eye Contact:No data recorded Speech:No data  recorded Speech Volume:No data recorded Handedness:No data recorded  Mood and Affect  Mood:No data recorded Affect:No data recorded  Thought Process  Thought Processes:No data recorded Descriptions of Associations:No data recorded Orientation:No data recorded Thought Content:No data recorded History of Schizophrenia/Schizoaffective disorder:Yes  Duration of Psychotic Symptoms:Greater than six months  Hallucinations:No data recorded Ideas of Reference:No data recorded Suicidal Thoughts:No data recorded Homicidal Thoughts:No data recorded  Sensorium  Memory:No data recorded Judgment:No data recorded Insight:No data recorded  Executive Functions  Concentration:No data recorded Attention Span:No data recorded Recall:No data recorded Fund of Knowledge:No data recorded Language:No data recorded  Psychomotor Activity  Psychomotor Activity:No data recorded  Assets  Assets:No data recorded  Sleep  Sleep:No data recorded   Physical Exam: Physical Exam Vitals and nursing note reviewed.  Constitutional:      Appearance: Normal appearance.  HENT:     Head: Normocephalic and atraumatic.     Mouth/Throat:     Pharynx: Oropharynx is clear.  Eyes:     Pupils: Pupils are equal, round, and reactive to light.  Cardiovascular:     Rate and Rhythm: Normal rate and regular rhythm.  Pulmonary:     Effort: Pulmonary effort is normal.     Breath sounds: Normal breath sounds.  Abdominal:     General: Abdomen is flat.     Palpations: Abdomen is soft.  Musculoskeletal:        General: Normal range of motion.  Skin:    General: Skin is warm and dry.  Neurological:     General: No focal deficit present.     Mental Status: She is alert. Mental status is at baseline.  Psychiatric:        Attention and Perception: Attention normal.        Mood and Affect: Mood normal.  Affect is blunt.        Speech: Speech is tangential.        Behavior: Behavior is withdrawn.        Thought Content: Thought content is paranoid.        Cognition and Memory: Memory is impaired.        Judgment: Judgment is inappropriate.    Review of Systems  Constitutional: Negative.   HENT: Negative.    Eyes: Negative.   Respiratory: Negative.    Cardiovascular: Negative.   Gastrointestinal: Negative.   Musculoskeletal: Negative.   Skin: Negative.   Neurological: Negative.   Psychiatric/Behavioral: Negative.     Blood pressure 109/69, pulse (!) 58, temperature 98.6 F (37 C), temperature source Oral, resp. rate 20, height 5\' 2"  (1.575 m), weight 93 kg, SpO2 98 %. Body mass index is 37.49 kg/m.   Treatment Plan Summary: Medication management and Plan reviewed past medications.  It looks like Abilify long-acting injectable has consistently been part of her regimen.  Not sure where the idea came from that that was recently changed to Haldol.  I am going to get her her haloperidol decanoate 400 mg shot today and change her over to oral Abilify as well.  Will talk with patient over the next day or so about whether we need to add lithium or other mood stabilizers.  Labs reviewed.  Engage in individual and group therapy.  Ongoing assessment prior to discharge planning.  Mordecai Rasmussen, MD 08/09/2022, 2:23 PM

## 2022-08-10 DIAGNOSIS — F3112 Bipolar disorder, current episode manic without psychotic features, moderate: Secondary | ICD-10-CM | POA: Diagnosis not present

## 2022-08-10 LAB — LIPID PANEL
Cholesterol: 142 mg/dL (ref 0–200)
HDL: 28 mg/dL — ABNORMAL LOW (ref >40)
LDL Cholesterol: 98 mg/dL (ref 0–99)
Total CHOL/HDL Ratio: 5.1 RATIO
Triglycerides: 79 mg/dL (ref <150)
VLDL: 16 mg/dL (ref 0–40)

## 2022-08-10 LAB — HEMOGLOBIN A1C
Hgb A1c MFr Bld: 5.2 % (ref 4.8–5.6)
Mean Plasma Glucose: 103 mg/dL

## 2022-08-10 NOTE — Group Note (Signed)
[facility] LCSW Group Therapy Note   Group Date: 08/10/2022 Start Time: 1300 End Time: 1400   Type of Therapy/Topic:  Group Therapy:  Emotion Regulation  Participation Level:  Did Not Attend    Description of Group:    The purpose of this group is to assist patients in learning to regulate negative emotions and experience positive emotions. Patients will be guided to discuss ways in which they have been vulnerable to their negative emotions. These vulnerabilities will be juxtaposed with experiences of positive emotions or situations, and patients challenged to use positive emotions to combat negative ones. Special emphasis will be placed on coping with negative emotions in conflict situations, and patients will process healthy conflict resolution skills.  Therapeutic Goals: Patient will identify two positive emotions or experiences to reflect on in order to balance out negative emotions:  Patient will label two or more emotions that they find the most difficult to experience:  Patient will be able to demonstrate positive conflict resolution skills through discussion or role plays:   Summary of Patient Progress: X   Therapeutic Modalities:   Cognitive Behavioral Therapy Feelings Identification Dialectical Behavioral Therapy   Horacio Werth R Francee Setzer, LCSW 

## 2022-08-10 NOTE — Group Note (Signed)
Recreation Therapy Group Note   Group Topic:Goal Setting  Group Date: 08/10/2022 Start Time: 1000 End Time: 1100 Facilitators: Rosina Lowenstein, LRT, CTRS Location:  C[facility] Room  Group description: Now Future Wall. Patients were given a sheet of paper and asked to fold it into 3 sections, like a pamphlet. Top section, patients were encouraged to write what they are feeling or experiencing "now". The bottom section, patients were asked to fill out how they want to feel or things they want to experience in the "future".  In the middle section, patients were encouraged to fill out any "walls" or barriers that are getting in the way of them reaching their "future". On the back of the sheet, patients were encouraged to write positive coping skills that will help them get over or though the walls they experience. LRT and patients discussed each of the sections and shared them aloud in group. Patients are encouraged to keep this paper with them as a guide/plan post discharge.   Goal Area(s) Addressed:  Patients will identify walls/triggers. Patients will identify and list coping skills to use post-discharge. Patients will work on goal setting, short or long-term. Patients will work on communication by reading aloud to group and engaging in post activity discussion.  Affect/Mood: N/A   Participation Level: Did not attend    Clinical Observations/Individualized Feedback: Janice Walls did not attend group due to resting in her room.  Plan: Continue to engage patient in RT group sessions 2-3x/week.   Rosina Lowenstein, LRT, CTRS 08/10/2022 11:55 AM

## 2022-08-10 NOTE — Plan of Care (Signed)
  Problem: Education: Goal: Knowledge of General Education information will improve Description: Including pain rating scale, medication(s)/side effects and non-pharmacologic comfort measures Outcome: Progressing   Problem: Health Behavior/Discharge Planning: Goal: Ability to manage health-related needs will improve Outcome: Progressing   Problem: Clinical Measurements: Goal: Ability to maintain clinical measurements within normal limits will improve Outcome: Progressing Goal: Will remain free from infection Outcome: Progressing Goal: Diagnostic test results will improve Outcome: Progressing Goal: Respiratory complications will improve Outcome: Progressing Goal: Cardiovascular complication will be avoided Outcome: Progressing   Problem: Activity: Goal: Risk for activity intolerance will decrease Outcome: Progressing   Problem: Nutrition: Goal: Adequate nutrition will be maintained Outcome: Progressing   Problem: Coping: Goal: Level of anxiety will decrease Outcome: Progressing   Problem: Elimination: Goal: Will not experience complications related to bowel motility Outcome: Progressing Goal: Will not experience complications related to urinary retention Outcome: Progressing   Problem: Pain Managment: Goal: General experience of comfort will improve Outcome: Progressing   Problem: Safety: Goal: Ability to remain free from injury will improve Outcome: Progressing   Problem: Skin Integrity: Goal: Risk for impaired skin integrity will decrease Outcome: Progressing   Problem: Education: Goal: Knowledge of [place] [facility] Education information/materials will improve Outcome: Progressing Goal: Emotional status will improve Outcome: Progressing Goal: Mental status will improve Outcome: Progressing Goal: Verbalization of understanding the information provided will improve Outcome: Progressing   Problem: Safety: Goal: Periods of time without injury will  increase Outcome: Progressing   Problem: Education: Goal: Will be free of psychotic symptoms Outcome: Progressing Goal: Knowledge of the prescribed therapeutic regimen will improve Outcome: Progressing

## 2022-08-10 NOTE — Progress Notes (Signed)
[facility] MD Progress Note  08/10/2022 10:20 AM Janice Walls  MRN:  [id number] Subjective: Follow-up 37 year old woman with bipolar disorder manic.  Patient seen and chart reviewed.  She was in bed this morning looking more sedate but was able to wake up and hold a conversation.  Not as agitated as yesterday.  Still seems to be paranoid had an odd paranoid comment this morning thinking that someone had "scannedher eyeball".  Nevertheless she got her medicine yesterday and seems to be on the way to getting better no specific complaint. Principal Problem: Bipolar affective disorder, currently manic, moderate Diagnosis: Principal Problem:   Bipolar affective disorder, currently manic, moderate  Total Time spent with patient: 30 minutes  Past Psychiatric History: Past history of bipolar disorder with recurrent manic symptoms as well as depressive in the past.  Past Medical History:  Past Medical History:  Diagnosis Date   Anxiety    Bipolar 1 disorder    Depression    Insomnia    Substance abuse     Past Surgical History:  Procedure Laterality Date   CESAREAN SECTION     CESAREAN SECTION     Family History:  Family History  Problem Relation Age of Onset   CAD Father 40   Family Psychiatric  History: See previous Social History:  Social History   Substance and Sexual Activity  Alcohol Use Not Currently   Comment: rare     Social History   Substance and Sexual Activity  Drug Use Not Currently   Types: Marijuana, Methamphetamines    Social History   Socioeconomic History   Marital status: Single    Spouse name: Not on file   Number of children: Not on file   Years of education: Not on file   Highest education level: Not on file  Occupational History   Occupation: unemployed  Tobacco Use   Smoking status: Every Day    Packs/day: 1.00    Years: 18.00    Additional pack years: 0.00    Total pack years: 18.00    Types: Cigarettes   Smokeless tobacco: Never  Vaping  Use   Vaping Use: Never used  Substance and Sexual Activity   Alcohol use: Not Currently    Comment: rare   Drug use: Not Currently    Types: Marijuana, Methamphetamines   Sexual activity: Yes    Birth control/protection: None  Other Topics Concern   Not on file  Social History Narrative   Not on file   Social Determinants of Health   Financial Resource Strain: Not on file  Food Insecurity: No Food Insecurity (08/05/2022)   Hunger Vital Sign    Worried About Running Out of Food in the Last Year: Never true    Ran Out of Food in the Last Year: Never true  Transportation Needs: No Transportation Needs (08/05/2022)   PRAPARE - Administrator, Civil Service (Medical): No    Lack of Transportation (Non-Medical): No  Physical Activity: Not on file  Stress: Not on file  Social Connections: Not on file   Additional Social History:                         Sleep: Fair  Appetite:  Fair  Current Medications: Current Facility-Administered Medications  Medication Dose Route Frequency Provider Last Rate Last Admin   acetaminophen (TYLENOL) tablet 650 mg  650 mg Oral Q6H PRN Charm Rings, NP  alum & mag hydroxide-simeth (MAALOX/MYLANTA) 200-200-20 MG/5ML suspension 30 mL  30 mL Oral Q6H PRN Charm RingsLord, Jamison Y, NP   30 mL at 08/08/22 1846   ARIPiprazole (ABILIFY) tablet 15 mg  15 mg Oral Daily Jillianna Stanek, Jackquline DenmarkJohn T, MD   15 mg at 08/09/22 1726   ARIPiprazole ER (ABILIFY MAINTENA) injection 400 mg  400 mg Intramuscular Q28 days Boomer Winders, Jackquline DenmarkJohn T, MD   400 mg at 08/09/22 1726   diphenhydrAMINE (BENADRYL) capsule 50 mg  50 mg Oral BID PRN Charm RingsLord, Jamison Y, NP   50 mg at 08/06/22 2123   Or   diphenhydrAMINE (BENADRYL) injection 50 mg  50 mg Intravenous BID PRN Charm RingsLord, Jamison Y, NP       ibuprofen (ADVIL) tablet 600 mg  600 mg Oral Q8H PRN Charm RingsLord, Jamison Y, NP       magnesium hydroxide (MILK OF MAGNESIA) suspension 30 mL  30 mL Oral Daily PRN Charm RingsLord, Jamison Y, NP       metoprolol  succinate (TOPROL-XL) 24 hr tablet 50 mg  50 mg Oral Daily Sarina IllHerrick, Richard Edward, DO   50 mg at 08/09/22 1309   nicotine (NICODERM CQ - dosed in mg/24 hours) patch 21 mg  21 mg Transdermal Daily Jayel Scaduto, Jackquline DenmarkJohn T, MD       nicotine polacrilex (NICORETTE) gum 2 mg  2 mg Oral PRN Sarina IllHerrick, Richard Edward, DO       OLANZapine [facility](ZYPREXA) tablet 10 mg  10 mg Oral BID PRN Charm RingsLord, Jamison Y, NP   10 mg at 08/08/22 2117   Or   OLANZapine (ZYPREXA) injection 10 mg  10 mg Intramuscular BID PRN Charm RingsLord, Jamison Y, NP       ondansetron [facility](ZOFRAN) tablet 4 mg  4 mg Oral Q8H PRN Charm RingsLord, Jamison Y, NP       topiramate (TOPAMAX) tablet 75 mg  75 mg Oral Daily Charm RingsLord, Jamison Y, NP   75 mg at 08/09/22 1309   valbenazine (INGREZZA) capsule 80 mg  80 mg Oral Daily Sarina IllHerrick, Richard Edward, DO   80 mg at 08/09/22 1309    Lab Results:  Results for orders placed or performed during the hospital encounter of 08/05/22 (from the past 48 hour(s))  Lipid panel     Status: Abnormal   Collection Time: 08/10/22  9:27 AM  Result Value Ref Range   Cholesterol 142 0 - 200 mg/dL   Triglycerides 79 <914<150 mg/dL   HDL 28 (L) >78>40 mg/dL   Total CHOL/HDL Ratio 5.1 RATIO   VLDL 16 0 - 40 mg/dL   LDL Cholesterol 98 0 - 99 mg/dL    Comment:        Total Cholesterol/HDL:CHD Risk Coronary Heart Disease Risk Table                     Men   Women  1/2 Average Risk   3.4   3.3  Average Risk       5.0   4.4  2 X Average Risk   9.6   7.1  3 X Average Risk  23.4   11.0        Use the calculated Patient Ratio above and the CHD Risk Table to determine the patient's CHD Risk.        ATP III CLASSIFICATION (LDL):  <100     mg/dL   Optimal  295-621100-129  mg/dL   Near or Above  Optimal  130-159  mg/dL   Borderline  786-767  mg/dL   High  >209     mg/dL   Very High Performed at [facility], [address] [place], [place] [address]     Blood Alcohol level:  Lab Results  Component Value Date   [facility] <10 08/04/2022    ETH <10 03/22/2021    Metabolic Disorder Labs: Lab Results  Component Value Date   HGBA1C 4.8 09/12/2016   MPG 91 09/12/2016   Lab Results  Component Value Date   PROLACTIN 37.4 (H) 09/12/2016   Lab Results  Component Value Date   CHOL 142 08/10/2022   TRIG 79 08/10/2022   HDL 28 (L) 08/10/2022   CHOLHDL 5.1 08/10/2022   VLDL 16 08/10/2022   LDLCALC 98 08/10/2022   LDLCALC 73 09/12/2016    Physical Findings: AIMS:  , ,  ,  ,    CIWA:    COWS:     Musculoskeletal: Strength & Muscle Tone: within normal limits Gait & Station: normal Patient leans: N/A  Psychiatric Specialty Exam:  Presentation  General Appearance: No data recorded Eye Contact:No data recorded Speech:No data recorded Speech Volume:No data recorded Handedness:No data recorded  Mood and Affect  Mood:No data recorded Affect:No data recorded  Thought Process  Thought Processes:No data recorded Descriptions of Associations:No data recorded Orientation:No data recorded Thought Content:No data recorded History of Schizophrenia/Schizoaffective disorder:Yes  Duration of Psychotic Symptoms:Greater than six months  Hallucinations:No data recorded Ideas of Reference:No data recorded Suicidal Thoughts:No data recorded Homicidal Thoughts:No data recorded  Sensorium  Memory:No data recorded Judgment:No data recorded Insight:No data recorded  Executive Functions  Concentration:No data recorded Attention Span:No data recorded Recall:No data recorded Fund of Knowledge:No data recorded Language:No data recorded  Psychomotor Activity  Psychomotor Activity:No data recorded  Assets  Assets:No data recorded  Sleep  Sleep:No data recorded   Physical Exam: Physical Exam Vitals and nursing note reviewed.  Constitutional:      Appearance: Normal appearance.  HENT:     Head: Normocephalic and atraumatic.     Mouth/Throat:     Pharynx: Oropharynx is clear.  Eyes:     Pupils: Pupils are  equal, round, and reactive to light.  Cardiovascular:     Rate and Rhythm: Normal rate and regular rhythm.  Pulmonary:     Effort: Pulmonary effort is normal.     Breath sounds: Normal breath sounds.  Abdominal:     General: Abdomen is flat.     Palpations: Abdomen is soft.  Musculoskeletal:        General: Normal range of motion.  Skin:    General: Skin is warm and dry.  Neurological:     General: No focal deficit present.     Mental Status: She is alert. Mental status is at baseline.  Psychiatric:        Attention and Perception: Attention normal.        Mood and Affect: Mood normal.        Speech: Speech normal.        Behavior: Behavior normal.        Thought Content: Thought content normal.        Cognition and Memory: Cognition normal.        Judgment: Judgment normal.    Review of Systems  Constitutional: Negative.   HENT: Negative.    Eyes: Negative.   Respiratory: Negative.    Cardiovascular: Negative.   Gastrointestinal: Negative.   Musculoskeletal: Negative.  Skin: Negative.   Neurological: Negative.   Psychiatric/Behavioral: Negative.     Blood pressure 110/69, pulse (!) 59, temperature 98.6 F (37 C), temperature source Oral, resp. rate 19, height 5\' 2"  (1.575 m), weight 93 kg, SpO2 100 %. Body mass index is 37.49 kg/m.   Treatment Plan Summary: Medication management and Plan patient got her Abilify shot yesterday which historically has probably been the most important thing to her stability.  She is also on some oral Abilify.  Vital stable.  No new labs.  Encouraged her to be out of bed during the daytime so she will sleep well at night and to attend groups and interact with [place].  Improved but not yet ready to go probably still having some psychosis.  Mordecai Rasmussen, MD 08/10/2022, 10:20 AM

## 2022-08-10 NOTE — Progress Notes (Signed)
D- Patient alert and disoriented.Affect blank/mood pleasant but bizarre. She been writing notes to the MD with crayons that are very disorganized. Denies SI/ HI/ AVH and doesn't appear to be responding to internal stimuli but UTD. She denies pain.  A- Scheduled medications administered to patient, per MD orders. Support and encouragement provided.  Routine safety checks conducted every 15 minutes without incident. Patient informed to notify staff with problems or concerns and verbalizes understanding. R- No adverse drug reactions noted. Patient compliant with medications and treatment plan. Patient calm and cooperative this shift. She is isolative to her room except for meals. She contracts for safety and remains safe on the unit at this time.

## 2022-08-11 ENCOUNTER — Other Ambulatory Visit: Payer: Self-pay

## 2022-08-11 DIAGNOSIS — F3112 Bipolar disorder, current episode manic without psychotic features, moderate: Secondary | ICD-10-CM | POA: Diagnosis not present

## 2022-08-11 MED ORDER — METOPROLOL SUCCINATE ER 50 MG PO TB24
50.0000 mg | ORAL_TABLET | Freq: Every day | ORAL | 0 refills | Status: DC
  Filled 2022-08-11: qty 10, 10d supply, fill #0

## 2022-08-11 MED ORDER — NICOTINE POLACRILEX 2 MG MT GUM
2.0000 mg | CHEWING_GUM | OROMUCOSAL | 0 refills | Status: DC | PRN
  Filled 2022-08-11: qty 50, 25d supply, fill #0

## 2022-08-11 MED ORDER — TOPIRAMATE 25 MG PO TABS
75.0000 mg | ORAL_TABLET | Freq: Every day | ORAL | 0 refills | Status: DC
  Filled 2022-08-11: qty 30, 10d supply, fill #0

## 2022-08-11 MED ORDER — ARIPIPRAZOLE 15 MG PO TABS
15.0000 mg | ORAL_TABLET | Freq: Every day | ORAL | 0 refills | Status: DC
  Filled 2022-08-11: qty 10, 10d supply, fill #0

## 2022-08-11 MED ORDER — NICOTINE 21 MG/24HR TD PT24
21.0000 mg | MEDICATED_PATCH | Freq: Every day | TRANSDERMAL | 0 refills | Status: DC
  Filled 2022-08-11: qty 14, 14d supply, fill #0

## 2022-08-11 NOTE — Progress Notes (Signed)
Pt denies SI/HI/AVH and verbally agrees to approach staff if these become apparent or before harming themselves/others. Rates depression 0/10. Rates anxiety 0/10. Rates pain 0/10. Pt has been sleeping for most of the day but was out of her room more in the evening. Pt came up to nurses station multiple times and need redirection often. Pt stated she was leaving today to her mother but the MD told her maybe Monday. Pt is disorganized. Scheduled medications administered to pt, per MD orders. RN provided support and encouragement to pt. Q15 min safety checks implemented and continued. Pt is safe on the unit. Plan of care on going and no other concerns expressed at this time.  08/11/22 0802  Psych Admission Type (Psych Patients Only)  Admission Status Involuntary  Psychosocial Assessment  Patient Complaints None  Eye Contact Brief  Facial Expression Flat  Affect Blunted;Preoccupied  Speech Soft;Logical/coherent  Interaction Guarded;Forwards little;Isolative  Motor Activity Slow  Appearance/Hygiene Unremarkable;In scrubs  Behavior Characteristics Cooperative;Appropriate to situation;Calm  Mood Preoccupied  Aggressive Behavior  Effect No apparent injury  Thought Process  Coherency Disorganized;Circumstantial  Content Preoccupation  Delusions None reported or observed  Perception UTA  Hallucination None reported or observed  Judgment Impaired  Confusion Mild  Danger to Self  Current suicidal ideation? Denies  Danger to Others  Danger to Others None reported or observed

## 2022-08-11 NOTE — Progress Notes (Signed)
Patient's mother, Jeremy Johann, called and voiced her concerns about patient being discharged, stating that Sidny called and told her she was going to be discharged in a few hours. Patient's mother was inquiring about patient's well-being and if she was taking her medication. Patient's mother also was asking if she could be informed of how patient would get her medication once discharged, being that she stays by herself. Patient's mother stated that she would help her out, but she has adopted two of Lorenna's children, whom are extremely terrified of her and are also in counseling due to the things they have seen and heard by patient. This Clinical research associate informed Jeremy Johann that this information would be passed along to the doctor.

## 2022-08-11 NOTE — Group Note (Signed)
Recreation Therapy Group Note   Group Topic:Leisure Education  Group Date: 08/11/2022 Start Time: 1000 End Time: 1100 Facilitators: Rosina Lowenstein, LRT, CTRS Location:  Craft Room  Group Description: Leisure. Patients were given the option to choose from drawing with oil pastels, playing apples to apples, making origami, journaling, or sing karaoke/listen to music duration of session. LRT and pts discussed the importance of participating in leisure during their free time and when they're outside of the hospital. Pt identified two leisure interests and shared with the group.   Goal Area(s) Addressed:  Patient will identify a current leisure interest.  Patient will practice making a positive decision. Patient will have the opportunity to try a new leisure activity.   Affect/Mood: N/A   Participation Level: Did not attend    Clinical Observations/Individualized Feedback: Janice Walls did not attend group due to resting in her room.  Plan: Continue to engage patient in RT group sessions 2-3x/week.   Rosina Lowenstein, LRT, CTRS 08/11/2022 11:33 AM

## 2022-08-11 NOTE — Progress Notes (Signed)
[facility] MD Progress Note  08/11/2022 1:04 PM Janice Walls  MRN:  [id number] Subjective: Patient seen for follow-up.  No new complaints.  During brief conversation did not appear to have active psychosis and did not have any active behavior problems in the last day not agitated no aggression.  Patient says she is feeling nearly back to her normal baseline. Principal Problem: Bipolar affective disorder, currently manic, moderate Diagnosis: Principal Problem:   Bipolar affective disorder, currently manic, moderate  Total Time spent with patient: 30 minutes  Past Psychiatric History: Past history of recurrent psychotic symptoms bipolar disorder  Past Medical History:  Past Medical History:  Diagnosis Date   Anxiety    Bipolar 1 disorder    Depression    Insomnia    Substance abuse     Past Surgical History:  Procedure Laterality Date   CESAREAN SECTION     CESAREAN SECTION     Family History:  Family History  Problem Relation Age of Onset   CAD Father 63   Family Psychiatric  History: None reported Social History:  Social History   Substance and Sexual Activity  Alcohol Use Not Currently   Comment: rare     Social History   Substance and Sexual Activity  Drug Use Not Currently   Types: Marijuana, Methamphetamines    Social History   Socioeconomic History   Marital status: Single    Spouse name: Not on file   Number of children: Not on file   Years of education: Not on file   Highest education level: Not on file  Occupational History   Occupation: unemployed  Tobacco Use   Smoking status: Every Day    Packs/day: 1.00    Years: 18.00    Additional pack years: 0.00    Total pack years: 18.00    Types: Cigarettes   Smokeless tobacco: Never  Vaping Use   Vaping Use: Never used  Substance and Sexual Activity   Alcohol use: Not Currently    Comment: rare   Drug use: Not Currently    Types: Marijuana, Methamphetamines   Sexual activity: Yes    Birth  control/protection: None  Other Topics Concern   Not on file  Social History Narrative   Not on file   Social Determinants of Health   Financial Resource Strain: Not on file  Food Insecurity: No Food Insecurity (08/05/2022)   Hunger Vital Sign    Worried About Running Out of Food in the Last Year: Never true    Ran Out of Food in the Last Year: Never true  Transportation Needs: No Transportation Needs (08/05/2022)   PRAPARE - Administrator, Civil Service (Medical): No    Lack of Transportation (Non-Medical): No  Physical Activity: Not on file  Stress: Not on file  Social Connections: Not on file   Additional Social History:                         Sleep: Fair  Appetite:  Fair  Current Medications: Current Facility-Administered Medications  Medication Dose Route Frequency Provider Last Rate Last Admin   acetaminophen (TYLENOL) tablet 650 mg  650 mg Oral Q6H PRN Charm Rings, NP       alum & mag hydroxide-simeth (MAALOX/MYLANTA) 200-200-20 MG/5ML suspension 30 mL  30 mL Oral Q6H PRN Charm Rings, NP   30 mL at 08/08/22 1846   ARIPiprazole (ABILIFY) tablet 15 mg  15 mg Oral Daily  Dimitri Shakespeare, Jackquline Denmark, MD   15 mg at 08/11/22 0802   ARIPiprazole ER (ABILIFY MAINTENA) injection 400 mg  400 mg Intramuscular Q28 days Delanda Bulluck, Jackquline Denmark, MD   400 mg at 08/09/22 1726   diphenhydrAMINE (BENADRYL) capsule 50 mg  50 mg Oral BID PRN Charm Rings, NP   50 mg at 08/06/22 2123   Or   diphenhydrAMINE (BENADRYL) injection 50 mg  50 mg Intravenous BID PRN Charm Rings, NP       ibuprofen (ADVIL) tablet 600 mg  600 mg Oral Q8H PRN Charm Rings, NP       magnesium hydroxide (MILK OF MAGNESIA) suspension 30 mL  30 mL Oral Daily PRN Charm Rings, NP       metoprolol succinate (TOPROL-XL) 24 hr tablet 50 mg  50 mg Oral Daily Sarina Ill, DO   50 mg at 08/10/22 1442   nicotine (NICODERM CQ - dosed in mg/24 hours) patch 21 mg  21 mg Transdermal Daily Hassan Blackshire,  Jackquline Denmark, MD       nicotine polacrilex (NICORETTE) gum 2 mg  2 mg Oral PRN Sarina Ill, DO       OLANZapine [facility]) tablet 10 mg  10 mg Oral BID PRN Charm Rings, NP   10 mg at 08/08/22 2117   Or   OLANZapine (ZYPREXA) injection 10 mg  10 mg Intramuscular BID PRN Charm Rings, NP       ondansetron [facility]) tablet 4 mg  4 mg Oral Q8H PRN Charm Rings, NP       topiramate (TOPAMAX) tablet 75 mg  75 mg Oral Daily Charm Rings, NP   75 mg at 08/11/22 0803   valbenazine (INGREZZA) capsule 80 mg  80 mg Oral Daily Sarina Ill, DO   80 mg at 08/11/22 9024    Lab Results:  Results for orders placed or performed during the hospital encounter of 08/05/22 (from the past 48 hour(s))  Lipid panel     Status: Abnormal   Collection Time: 08/10/22  9:27 AM  Result Value Ref Range   Cholesterol 142 0 - 200 mg/dL   Triglycerides 79 <097 mg/dL   HDL 28 (L) >35 mg/dL   Total CHOL/HDL Ratio 5.1 RATIO   VLDL 16 0 - 40 mg/dL   LDL Cholesterol 98 0 - 99 mg/dL    Comment:        Total Cholesterol/HDL:CHD Risk Coronary Heart Disease Risk Table                     Men   Women  1/2 Average Risk   3.4   3.3  Average Risk       5.0   4.4  2 X Average Risk   9.6   7.1  3 X Average Risk  23.4   11.0        Use the calculated Patient Ratio above and the CHD Risk Table to determine the patient's CHD Risk.        ATP III CLASSIFICATION (LDL):  <100     mg/dL   Optimal  329-924  mg/dL   Near or Above                    Optimal  130-159  mg/dL   Borderline  268-341  mg/dL   High  >962     mg/dL   Very High Performed at [facility],  [address] [place], [place] [address]   Hemoglobin A1c     Status: None   Collection Time: 08/10/22  9:27 AM  Result Value Ref Range   Hgb A1c MFr Bld 5.2 4.8 - 5.6 %    Comment: (NOTE)         Prediabetes: 5.7 - 6.4         Diabetes: >6.4         Glycemic control for adults with diabetes: <7.0    Mean Plasma Glucose 103  mg/dL    Comment: (NOTE) Performed At: [facility] [address] [place], [place] [id number] Jolene Schimke MD XB:[id number]     Blood Alcohol level:  Lab Results  Component Value Date   [facility] <10 08/04/2022   ETH <10 03/22/2021    Metabolic Disorder Labs: Lab Results  Component Value Date   HGBA1C 5.2 08/10/2022   MPG 103 08/10/2022   MPG 91 09/12/2016   Lab Results  Component Value Date   PROLACTIN 37.4 (H) 09/12/2016   Lab Results  Component Value Date   CHOL 142 08/10/2022   TRIG 79 08/10/2022   HDL 28 (L) 08/10/2022   CHOLHDL 5.1 08/10/2022   VLDL 16 08/10/2022   LDLCALC 98 08/10/2022   LDLCALC 73 09/12/2016    Physical Findings: AIMS: Facial and Oral Movements Muscles of Facial Expression: None, normal Lips and Perioral Area: None, normal Jaw: None, normal Tongue: None, normal,Extremity Movements Upper (arms, wrists, hands, fingers): None, normal Lower (legs, knees, ankles, toes): None, normal, Trunk Movements Neck, shoulders, hips: None, normal, Overall Severity Severity of abnormal movements (highest score from questions above): None, normal Incapacitation due to abnormal movements: None, normal Patient's awareness of abnormal movements (rate only patient's report): No Awareness, Dental Status Current problems with teeth and/or dentures?: No Does patient usually wear dentures?: No  CIWA:    COWS:     Musculoskeletal: Strength & Muscle Tone: within normal limits Gait & Station: normal Patient leans: N/A  Psychiatric Specialty Exam:  Presentation  General Appearance: No data recorded Eye Contact:No data recorded Speech:No data recorded Speech Volume:No data recorded Handedness:No data recorded  Mood and Affect  Mood:No data recorded Affect:No data recorded  Thought Process  Thought Processes:No data recorded Descriptions of Associations:No data recorded Orientation:No data recorded Thought Content:No data recorded History of  Schizophrenia/Schizoaffective disorder:Yes  Duration of Psychotic Symptoms:Greater than six months  Hallucinations:No data recorded Ideas of Reference:No data recorded Suicidal Thoughts:No data recorded Homicidal Thoughts:No data recorded  Sensorium  Memory:No data recorded Judgment:No data recorded Insight:No data recorded  Executive Functions  Concentration:No data recorded Attention Span:No data recorded Recall:No data recorded Fund of Knowledge:No data recorded Language:No data recorded  Psychomotor Activity  Psychomotor Activity:No data recorded  Assets  Assets:No data recorded  Sleep  Sleep:No data recorded   Physical Exam: Physical Exam Vitals and nursing note reviewed.  Constitutional:      Appearance: Normal appearance.  HENT:     Head: Normocephalic and atraumatic.     Mouth/Throat:     Pharynx: Oropharynx is clear.  Eyes:     Pupils: Pupils are equal, round, and reactive to light.  Cardiovascular:     Rate and Rhythm: Normal rate and regular rhythm.  Pulmonary:     Effort: Pulmonary effort is normal.     Breath sounds: Normal breath sounds.  Abdominal:     General: Abdomen is flat.     Palpations: Abdomen is soft.  Musculoskeletal:  General: Normal range of motion.  Skin:    General: Skin is warm and dry.  Neurological:     General: No focal deficit present.     Mental Status: She is alert. Mental status is at baseline.  Psychiatric:        Attention and Perception: Attention normal.        Mood and Affect: Mood normal.        Speech: Speech normal.        Behavior: Behavior is cooperative.        Thought Content: Thought content normal.        Cognition and Memory: Cognition normal.    Review of Systems  Constitutional: Negative.   HENT: Negative.    Eyes: Negative.   Respiratory: Negative.    Cardiovascular: Negative.   Gastrointestinal: Negative.   Musculoskeletal: Negative.   Skin: Negative.   Neurological: Negative.    Psychiatric/Behavioral: Negative.     Blood pressure 101/63, pulse (!) 59, temperature 98.2 F (36.8 C), temperature source Oral, resp. rate 20, height  (1.575 m), weight 93 kg, SpO2 96 %. Body mass index is 37.49 kg/m.   Treatment Plan Summary: Medication management and Plan overall seems to be doing much better.  Tolerated her long-acting injection.  Taking medicine well.  Probable discharge in about 2 days.  Mordecai Rasmussen, MD 08/11/2022, 1:04 PM

## 2022-08-11 NOTE — Group Note (Signed)
[facility] LCSW Group Therapy Note   Group Date: 08/11/2022 Start Time: 1315 End Time: 1430  Type of Therapy and Topic:  Group Therapy:  Feelings around Relapse and Recovery  Participation Level:  Did Not Attend    Description of Group:    Patients in this group will discuss emotions they experience before and after a relapse. They will process how experiencing these feelings, or avoidance of experiencing them, relates to having a relapse. Facilitator will guide patients to explore emotions they have related to recovery. Patients will be encouraged to process which emotions are more powerful. They will be guided to discuss the emotional reaction significant others in their lives may have to patients' relapse or recovery. Patients will be assisted in exploring ways to respond to the emotions of others without this contributing to a relapse.  Therapeutic Goals: Patient will identify two or more emotions that lead to relapse for them:  Patient will identify two emotions that result when they relapse:  Patient will identify two emotions related to recovery:  Patient will demonstrate ability to communicate their needs through discussion and/or role plays.   Summary of Patient Progress: X   Therapeutic Modalities:   Cognitive Behavioral Therapy Solution-Focused Therapy Assertiveness Training Relapse Prevention Therapy   [name], LCSW 

## 2022-08-12 DIAGNOSIS — F3112 Bipolar disorder, current episode manic without psychotic features, moderate: Secondary | ICD-10-CM | POA: Diagnosis not present

## 2022-08-12 NOTE — Progress Notes (Signed)
Pt denies SI/HI/AVH and verbally agrees to approach staff if these become apparent or before harming themselves/others. Rates depression 0/10. Rates anxiety 0/10. Rates pain 0/10. Pt has been up at the nurses station throughout the day. Pt seems to have more insight. Pt stated that she is excited to leave on Monday. Scheduled medications administered to pt, per MD orders. RN provided support and encouragement to pt. Q15 min safety checks implemented and continued. Pt is safe on the unit. Plan of care on going and no other concerns expressed at this time.  08/12/22 0803  Psych Admission Type (Psych Patients Only)  Admission Status Voluntary  Psychosocial Assessment  Patient Complaints None  Eye Contact Fair  Facial Expression Animated  Affect Appropriate to circumstance  Speech Logical/coherent  Interaction Assertive;Isolative  Motor Activity Slow  Appearance/Hygiene In scrubs;Disheveled  Behavior Characteristics Cooperative;Appropriate to situation;Calm  Mood Pleasant;Preoccupied  Aggressive Behavior  Effect No apparent injury  Thought Process  Coherency Disorganized  Content Preoccupation  Delusions None reported or observed  Perception WDL  Hallucination None reported or observed  Judgment Impaired  Confusion Mild  Danger to Self  Current suicidal ideation? Denies  Danger to Others  Danger to Others None reported or observed

## 2022-08-12 NOTE — Group Note (Signed)
LCSW Group Therapy Note   Group Date: 08/12/2022 Start Time: 1400 End Time: 1510   Type of Therapy and Topic:  Group Therapy: AA/NA  Participation Level:  Did Not Attend  Summary of Patient Progress:    Patient did not attend groups.  Marshell Levan, LCSWA 08/12/2022  3:20 PM

## 2022-08-12 NOTE — Progress Notes (Signed)
No  distress noted patient denies SI/HI/AVH, he thoughts are organized and coherent, interacting appropriately with peers and staff , he denies pain. 15 minutes safety checks maintained will continue to monitor.

## 2022-08-12 NOTE — BH IP Treatment Plan (Signed)
Interdisciplinary Treatment and Diagnostic Plan Update  08/12/2022 Time of Session: 10:30 am Janice Walls MRN: [id number]  Principal Diagnosis: Bipolar affective disorder, currently manic, moderate  Secondary Diagnoses: Principal Problem:   Bipolar affective disorder, currently manic, moderate   Current Medications:  Current Facility-Administered Medications  Medication Dose Route Frequency Provider Last Rate Last Admin   acetaminophen (TYLENOL) tablet 650 mg  650 mg Oral Q6H PRN Charm Rings, NP       alum & mag hydroxide-simeth (MAALOX/MYLANTA) 200-200-20 MG/5ML suspension 30 mL  30 mL Oral Q6H PRN Charm Rings, NP   30 mL at 08/08/22 1846   ARIPiprazole (ABILIFY) tablet 15 mg  15 mg Oral Daily Clapacs, Jackquline Denmark, MD   15 mg at 08/12/22 0802   ARIPiprazole ER (ABILIFY MAINTENA) injection 400 mg  400 mg Intramuscular Q28 days Clapacs, John T, MD   400 mg at 08/09/22 1726   diphenhydrAMINE (BENADRYL) capsule 50 mg  50 mg Oral BID PRN Charm Rings, NP   50 mg at 08/06/22 2123   Or   diphenhydrAMINE (BENADRYL) injection 50 mg  50 mg Intravenous BID PRN Charm Rings, NP       ibuprofen (ADVIL) tablet 600 mg  600 mg Oral Q8H PRN Charm Rings, NP       magnesium hydroxide (MILK OF MAGNESIA) suspension 30 mL  30 mL Oral Daily PRN Charm Rings, NP       metoprolol succinate (TOPROL-XL) 24 hr tablet 50 mg  50 mg Oral Daily Sarina Ill, DO   50 mg at 08/12/22 7829   nicotine (NICODERM CQ - dosed in mg/24 hours) patch 21 mg  21 mg Transdermal Daily Clapacs, John T, MD       nicotine polacrilex (NICORETTE) gum 2 mg  2 mg Oral PRN Sarina Ill, DO       OLANZapine (ZYPREXA) tablet 10 mg  10 mg Oral BID PRN Charm Rings, NP   10 mg at 08/08/22 2117   Or   OLANZapine (ZYPREXA) injection 10 mg  10 mg Intramuscular BID PRN Charm Rings, NP       ondansetron [facility]) tablet 4 mg  4 mg Oral Q8H PRN Charm Rings, NP       topiramate (TOPAMAX) tablet 75 mg   75 mg Oral Daily Charm Rings, NP   75 mg at 08/12/22 0850   valbenazine (INGREZZA) capsule 80 mg  80 mg Oral Daily Sarina Ill, DO   80 mg at 08/12/22 0850   PTA Medications: Medications Prior to Admission  Medication Sig Dispense Refill Last Dose   CAPLYTA 42 MG capsule Take 42 mg by mouth daily. (Patient not taking: Reported on 08/04/2022)      HALDOL DECANOATE 100 MG/ML injection Inject 100 mg into the muscle every 30 (thirty) days. (Patient not taking: Reported on 08/04/2022)      INGREZZA 80 MG capsule Take 80 mg by mouth daily. (Patient not taking: Reported on 08/04/2022)      naloxone [facility]) nasal spray 4 mg/0.1 mL Place 1 spray into the nose as directed. (Patient not taking: Reported on 08/04/2022)      [DISCONTINUED] TOPAMAX 25 MG tablet Take 75 mg by mouth daily. (Patient not taking: Reported on 08/04/2022)       Patient Stressors: Marital or family conflict   Medication change or noncompliance    Patient Strengths: Forensic psychologist fund of knowledge   Treatment Modalities:  Medication Management, Group therapy, Case management,  1 to 1 session with clinician, Psychoeducation, Recreational therapy.   Physician Treatment Plan for Primary Diagnosis: Bipolar affective disorder, currently manic, moderate Long Term Goal(s): Improvement in symptoms so as ready for discharge   Short Term Goals: Ability to identify changes in lifestyle to reduce recurrence of condition will improve Ability to verbalize feelings will improve Ability to disclose and discuss suicidal ideas Ability to demonstrate self-control will improve Ability to identify and develop effective coping behaviors will improve Ability to maintain clinical measurements within normal limits will improve Compliance with prescribed medications will improve Ability to identify triggers associated with substance abuse/mental health issues will improve  Medication Management: Evaluate patient's response,  side effects, and tolerance of medication regimen.  Therapeutic Interventions: 1 to 1 sessions, Unit Group sessions and Medication administration.  Evaluation of Outcomes: Not Progressing  Physician Treatment Plan for Secondary Diagnosis: Principal Problem:   Bipolar affective disorder, currently manic, moderate  Long Term Goal(s): Improvement in symptoms so as ready for discharge   Short Term Goals: Ability to identify changes in lifestyle to reduce recurrence of condition will improve Ability to verbalize feelings will improve Ability to disclose and discuss suicidal ideas Ability to demonstrate self-control will improve Ability to identify and develop effective coping behaviors will improve Ability to maintain clinical measurements within normal limits will improve Compliance with prescribed medications will improve Ability to identify triggers associated with substance abuse/mental health issues will improve     Medication Management: Evaluate patient's response, side effects, and tolerance of medication regimen.  Therapeutic Interventions: 1 to 1 sessions, Unit Group sessions and Medication administration.  Evaluation of Outcomes: Not Progressing   RN Treatment Plan for Primary Diagnosis: Bipolar affective disorder, currently manic, moderate Long Term Goal(s): Knowledge of disease and therapeutic regimen to maintain health will improve  Short Term Goals: Ability to verbalize frustration and anger appropriately will improve, Ability to demonstrate self-control, Ability to participate in decision making will improve, Ability to verbalize feelings will improve, Ability to disclose and discuss suicidal ideas, Ability to identify and develop effective coping behaviors will improve, and Compliance with prescribed medications will improve  Medication Management: RN will administer medications as ordered by provider, will assess and evaluate patient's response and provide education to  patient for prescribed medication. RN will report any adverse and/or side effects to prescribing provider.  Therapeutic Interventions: 1 on 1 counseling sessions, Psychoeducation, Medication administration, Evaluate responses to treatment, Monitor vital signs and CBGs as ordered, Perform/monitor CIWA, COWS, AIMS and Fall Risk screenings as ordered, Perform wound care treatments as ordered.  Evaluation of Outcomes: Not Progressing   LCSW Treatment Plan for Primary Diagnosis: Bipolar affective disorder, currently manic, moderate Long Term Goal(s): Safe transition to appropriate next level of care at discharge, Engage patient in therapeutic group addressing interpersonal concerns.  Short Term Goals: Engage patient in aftercare planning with referrals and resources, Increase social support, Increase ability to appropriately verbalize feelings, Increase emotional regulation, Facilitate acceptance of mental health diagnosis and concerns, Facilitate patient progression through stages of change regarding substance use diagnoses and concerns, Identify triggers associated with mental health/substance abuse issues, and Increase skills for wellness and recovery  Therapeutic Interventions: Assess for all discharge needs, 1 to 1 time with Social worker, Explore available resources and support systems, Assess for adequacy in community support network, Educate family and significant other(s) on suicide prevention, Complete Psychosocial Assessment, Interpersonal group therapy.  Evaluation of Outcomes: Not Progressing   Progress in  Treatment: Attending groups: No. Participating in groups: No. Taking medication as prescribed: Yes. Toleration medication: Yes. Family/Significant other contact made: Yes, individual(s) contacted:  Have spoken to her mother/guardian.  Patient understands diagnosis: Yes. Discussing patient identified problems/goals with staff: No. Medical problems stabilized or resolved:  Yes. Denies suicidal/homicidal ideation: No. Issues/concerns per patient self-inventory: No. Other: none  New problem(s) identified: No, Describe:  none identified Update 08/12/2022: The patient has not identified any no problems.    New Short Term/Long Term Goal(s): elimination of symptoms of psychosis, medication management for mood stabilization; elimination of SI thoughts; development of comprehensive mental wellness plan. Update 08/12/2022: the patient has no new goals.  Patient Goals:  Patient declined to attend treatment team meeting despite personal invitation by staff nurse. Update 08/12/2022: none    Discharge Plan or Barriers: CSW will assist pt with development of an appropriate aftercare/discharge plan. Update 08/12/2022: patient has no new barriers preventing discharge.   Reason for Continuation of Hospitalization: Delusions  Depression Hallucinations Medication stabilization   Estimated Length of Stay: 1-7 days Update 08/12/2022: TBD  Last 3 [place] Suicide Severity Risk Score: Flowsheet Row Admission (Current) from 08/05/2022 in [facility] INPATIENT BEH[facility]AL MEDICINE ED from 08/04/2022 in [facility] Emergency Department at [facility] ED from 12/14/2021 in [facility] Emergency Department at [facility]  C-SSRS RISK CATEGORY No Risk No Risk No Risk       Last PHQ 2/9 Scores:     No data to display          Scribe for Treatment Team: Marshell Levan, LCSW 08/12/2022 2:47 PM

## 2022-08-12 NOTE — Progress Notes (Signed)
[facility] MD Progress Note  08/12/2022 12:14 PM Tasheena Wambolt  MRN:  [id number] Subjective: Follow-up for this 37 year old woman with bipolar disorder.  Yesterday when we spoke she seemed to be doing pretty well.  Unfortunately she then told her mother that she was being discharged yesterday which caused the mother to anxiously contact staff expressing worries that the patient was still dangerous and unwell.  This morning the patient was out of bed sitting in the day room watching television and she interacted with me pleasantly and appropriately.  She said that she knows that she needs to stay away from drugs and stay on her medicine.  She denies hallucinations or paranoia.  Calm without any agitation.  Physically appears stable.  Vital signs unremarkable.  No new labs today. Principal Problem: Bipolar affective disorder, currently manic, moderate Diagnosis: Principal Problem:   Bipolar affective disorder, currently manic, moderate  Total Time spent with patient: 30 minutes  Past Psychiatric History: Past history of bipolar disorder  Past Medical History:  Past Medical History:  Diagnosis Date   Anxiety    Bipolar 1 disorder    Depression    Insomnia    Substance abuse     Past Surgical History:  Procedure Laterality Date   CESAREAN SECTION     CESAREAN SECTION     Family History:  Family History  Problem Relation Age of Onset   CAD Father 72   Family Psychiatric  History: See previous Social History:  Social History   Substance and Sexual Activity  Alcohol Use Not Currently   Comment: rare     Social History   Substance and Sexual Activity  Drug Use Not Currently   Types: Marijuana, Methamphetamines    Social History   Socioeconomic History   Marital status: Single    Spouse name: Not on file   Number of children: Not on file   Years of education: Not on file   Highest education level: Not on file  Occupational History   Occupation: unemployed  Tobacco Use    Smoking status: Every Day    Packs/day: 1.00    Years: 18.00    Additional pack years: 0.00    Total pack years: 18.00    Types: Cigarettes   Smokeless tobacco: Never  Vaping Use   Vaping Use: Never used  Substance and Sexual Activity   Alcohol use: Not Currently    Comment: rare   Drug use: Not Currently    Types: Marijuana, Methamphetamines   Sexual activity: Yes    Birth control/protection: None  Other Topics Concern   Not on file  Social History Narrative   Not on file   Social Determinants of Health   Financial Resource Strain: Not on file  Food Insecurity: No Food Insecurity (08/05/2022)   Hunger Vital Sign    Worried About Running Out of Food in the Last Year: Never true    Ran Out of Food in the Last Year: Never true  Transportation Needs: No Transportation Needs (08/05/2022)   PRAPARE - Administrator, Civil Service (Medical): No    Lack of Transportation (Non-Medical): No  Physical Activity: Not on file  Stress: Not on file  Social Connections: Not on file   Additional Social History:                         Sleep: Fair  Appetite:  Fair  Current Medications: Current Facility-Administered Medications  Medication Dose Route  Frequency Provider Last Rate Last Admin   acetaminophen (TYLENOL) tablet 650 mg  650 mg Oral Q6H PRN Charm Rings, NP       alum & mag hydroxide-simeth (MAALOX/MYLANTA) 200-200-20 MG/5ML suspension 30 mL  30 mL Oral Q6H PRN Charm Rings, NP   30 mL at 08/08/22 1846   ARIPiprazole (ABILIFY) tablet 15 mg  15 mg Oral Daily Twain Stenseth, Jackquline Denmark, MD   15 mg at 08/12/22 0802   ARIPiprazole ER (ABILIFY MAINTENA) injection 400 mg  400 mg Intramuscular Q28 days Jehieli Brassell, Jackquline Denmark, MD   400 mg at 08/09/22 1726   diphenhydrAMINE (BENADRYL) capsule 50 mg  50 mg Oral BID PRN Charm Rings, NP   50 mg at 08/06/22 2123   Or   diphenhydrAMINE (BENADRYL) injection 50 mg  50 mg Intravenous BID PRN Charm Rings, NP       ibuprofen  (ADVIL) tablet 600 mg  600 mg Oral Q8H PRN Charm Rings, NP       magnesium hydroxide (MILK OF MAGNESIA) suspension 30 mL  30 mL Oral Daily PRN Charm Rings, NP       metoprolol succinate (TOPROL-XL) 24 hr tablet 50 mg  50 mg Oral Daily Sarina Ill, DO   50 mg at 08/12/22 1540   nicotine (NICODERM CQ - dosed in mg/24 hours) patch 21 mg  21 mg Transdermal Daily Lacreshia Bondarenko, Jackquline Denmark, MD       nicotine polacrilex (NICORETTE) gum 2 mg  2 mg Oral PRN Sarina Ill, DO       OLANZapine [facility]) tablet 10 mg  10 mg Oral BID PRN Charm Rings, NP   10 mg at 08/08/22 2117   Or   OLANZapine (ZYPREXA) injection 10 mg  10 mg Intramuscular BID PRN Charm Rings, NP       ondansetron [facility]) tablet 4 mg  4 mg Oral Q8H PRN Charm Rings, NP       topiramate (TOPAMAX) tablet 75 mg  75 mg Oral Daily Charm Rings, NP   75 mg at 08/12/22 0850   valbenazine (INGREZZA) capsule 80 mg  80 mg Oral Daily Sarina Ill, DO   80 mg at 08/12/22 0867    Lab Results: No results found for this or any previous visit (from the past 48 hour(s)).  Blood Alcohol level:  Lab Results  Component Value Date   ETH <10 08/04/2022   ETH <10 03/22/2021    Metabolic Disorder Labs: Lab Results  Component Value Date   HGBA1C 5.2 08/10/2022   MPG 103 08/10/2022   MPG 91 09/12/2016   Lab Results  Component Value Date   PROLACTIN 37.4 (H) 09/12/2016   Lab Results  Component Value Date   CHOL 142 08/10/2022   TRIG 79 08/10/2022   HDL 28 (L) 08/10/2022   CHOLHDL 5.1 08/10/2022   VLDL 16 08/10/2022   LDLCALC 98 08/10/2022   LDLCALC 73 09/12/2016    Physical Findings: AIMS: Facial and Oral Movements Muscles of Facial Expression: None, normal Lips and Perioral Area: None, normal Jaw: None, normal Tongue: None, normal,Extremity Movements Upper (arms, wrists, hands, fingers): None, normal Lower (legs, knees, ankles, toes): None, normal, Trunk Movements Neck, shoulders, hips:  None, normal, Overall Severity Severity of abnormal movements (highest score from questions above): None, normal Incapacitation due to abnormal movements: None, normal Patient's awareness of abnormal movements (rate only patient's report): No Awareness, Dental Status Current problems with teeth  and/or dentures?: No Does patient usually wear dentures?: No  CIWA:    COWS:     Musculoskeletal: Strength & Muscle Tone: within normal limits Gait & Station: normal Patient leans: N/A  Psychiatric Specialty Exam:  Presentation  General Appearance: No data recorded Eye Contact:No data recorded Speech:No data recorded Speech Volume:No data recorded Handedness:No data recorded  Mood and Affect  Mood:No data recorded Affect:No data recorded  Thought Process  Thought Processes:No data recorded Descriptions of Associations:No data recorded Orientation:No data recorded Thought Content:No data recorded History of Schizophrenia/Schizoaffective disorder:Yes  Duration of Psychotic Symptoms:Greater than six months  Hallucinations:No data recorded Ideas of Reference:No data recorded Suicidal Thoughts:No data recorded Homicidal Thoughts:No data recorded  Sensorium  Memory:No data recorded Judgment:No data recorded Insight:No data recorded  Executive Functions  Concentration:No data recorded Attention Span:No data recorded Recall:No data recorded Fund of Knowledge:No data recorded Language:No data recorded  Psychomotor Activity  Psychomotor Activity:No data recorded  Assets  Assets:No data recorded  Sleep  Sleep:No data recorded   Physical Exam: Physical Exam Vitals and nursing note reviewed.  Constitutional:      Appearance: Normal appearance.  HENT:     Head: Normocephalic and atraumatic.     Mouth/Throat:     Pharynx: Oropharynx is clear.  Eyes:     Pupils: Pupils are equal, round, and reactive to light.  Cardiovascular:     Rate and Rhythm: Normal rate and  regular rhythm.  Pulmonary:     Effort: Pulmonary effort is normal.     Breath sounds: Normal breath sounds.  Abdominal:     General: Abdomen is flat.     Palpations: Abdomen is soft.  Musculoskeletal:        General: Normal range of motion.  Skin:    General: Skin is warm and dry.  Neurological:     General: No focal deficit present.     Mental Status: She is alert. Mental status is at baseline.  Psychiatric:        Mood and Affect: Mood normal.        Thought Content: Thought content normal.    ROS Blood pressure 104/82, pulse 90, temperature 98.2 F (36.8 C), temperature source Oral, resp. rate 20, height  (1.575 m), weight 93 kg, SpO2 97 %. Body mass index is 37.49 kg/m.   Treatment Plan Summary: Medication management and Plan no change to medication.  Supportive encouragement to the patient and reinforcement of her understanding that she needs to change some of her behaviors to remain safe and stable.  I still think we may be on track for discharge by Monday.  Patient aware.  Mordecai Rasmussen, MD 08/12/2022, 12:14 PM

## 2022-08-12 NOTE — Plan of Care (Signed)
  Problem: Education: Goal: Knowledge of General Education information will improve Description: Including pain rating scale, medication(s)/side effects and non-pharmacologic comfort measures Outcome: Progressing   Problem: Nutrition: Goal: Adequate nutrition will be maintained Outcome: Progressing   Problem: Coping: Goal: Level of anxiety will decrease Outcome: Progressing   Problem: Education: Goal: Knowledge of [place] [facility] Education information/materials will improve Outcome: Progressing Goal: Emotional status will improve Outcome: Progressing Goal: Mental status will improve Outcome: Progressing   

## 2022-08-13 DIAGNOSIS — F3112 Bipolar disorder, current episode manic without psychotic features, moderate: Secondary | ICD-10-CM | POA: Diagnosis not present

## 2022-08-13 NOTE — Plan of Care (Signed)
Pt is discharge focused. Pt denies SI / HI / AVH. Pt is adherent with scheduled medications. No signs of distress or injury. PT is seen out in milieu during meals but is frequently in bed. Staff will continue to monitor Q 15 for safety.    Problem: Education: Goal: Knowledge of General Education information will improve Description: Including pain rating scale, medication(s)/side effects and non-pharmacologic comfort measures Outcome: Progressing   Problem: Clinical Measurements: Goal: Will remain free from infection Outcome: Progressing Goal: Diagnostic test results will improve Outcome: Progressing

## 2022-08-13 NOTE — Progress Notes (Signed)
[facility] MD Progress Note  08/13/2022 11:56 AM Janice Walls  MRN:  [id number] Subjective: Follow-up 37 year old woman with bipolar disorder.  No new complaints.  Behavior has been calm without any agitation on the unit.  Able to hold conversations without disorganization or psychosis.  No violent behavior.  Agrees to the need to stay on medicine and stop using drugs. Principal Problem: Bipolar affective disorder, currently manic, moderate Diagnosis: Principal Problem:   Bipolar affective disorder, currently manic, moderate  Total Time spent with patient: 30 minutes  Past Psychiatric History: Past history of bipolar disorder  Past Medical History:  Past Medical History:  Diagnosis Date   Anxiety    Bipolar 1 disorder    Depression    Insomnia    Substance abuse     Past Surgical History:  Procedure Laterality Date   CESAREAN SECTION     CESAREAN SECTION     Family History:  Family History  Problem Relation Age of Onset   CAD Father 45   Family Psychiatric  History: See previous Social History:  Social History   Substance and Sexual Activity  Alcohol Use Not Currently   Comment: rare     Social History   Substance and Sexual Activity  Drug Use Not Currently   Types: Marijuana, Methamphetamines    Social History   Socioeconomic History   Marital status: Single    Spouse name: Not on file   Number of children: Not on file   Years of education: Not on file   Highest education level: Not on file  Occupational History   Occupation: unemployed  Tobacco Use   Smoking status: Every Day    Packs/day: 1.00    Years: 18.00    Additional pack years: 0.00    Total pack years: 18.00    Types: Cigarettes   Smokeless tobacco: Never  Vaping Use   Vaping Use: Never used  Substance and Sexual Activity   Alcohol use: Not Currently    Comment: rare   Drug use: Not Currently    Types: Marijuana, Methamphetamines   Sexual activity: Yes    Birth control/protection: None   Other Topics Concern   Not on file  Social History Narrative   Not on file   Social Determinants of Health   Financial Resource Strain: Not on file  Food Insecurity: No Food Insecurity (08/05/2022)   Hunger Vital Sign    Worried About Running Out of Food in the Last Year: Never true    Ran Out of Food in the Last Year: Never true  Transportation Needs: No Transportation Needs (08/05/2022)   PRAPARE - Administrator, Civil Service (Medical): No    Lack of Transportation (Non-Medical): No  Physical Activity: Not on file  Stress: Not on file  Social Connections: Not on file   Additional Social History:                         Sleep: Fair  Appetite:  Good  Current Medications: Current Facility-Administered Medications  Medication Dose Route Frequency Provider Last Rate Last Admin   acetaminophen (TYLENOL) tablet 650 mg  650 mg Oral Q6H PRN Charm Rings, NP       alum & mag hydroxide-simeth (MAALOX/MYLANTA) 200-200-20 MG/5ML suspension 30 mL  30 mL Oral Q6H PRN Charm Rings, NP   30 mL at 08/08/22 1846   ARIPiprazole (ABILIFY) tablet 15 mg  15 mg Oral Daily Kert Shackett, Jackquline Denmark,  MD   15 mg at 08/13/22 0836   ARIPiprazole ER (ABILIFY MAINTENA) injection 400 mg  400 mg Intramuscular Q28 days Orla Jolliff T, MD   400 mg at 08/09/22 1726   diphenhydrAMINE (BENADRYL) capsule 50 mg  50 mg Oral BID PRN Charm Rings, NP   50 mg at 08/06/22 2123   Or   diphenhydrAMINE (BENADRYL) injection 50 mg  50 mg Intravenous BID PRN Charm Rings, NP       ibuprofen (ADVIL) tablet 600 mg  600 mg Oral Q8H PRN Charm Rings, NP       magnesium hydroxide (MILK OF MAGNESIA) suspension 30 mL  30 mL Oral Daily PRN Charm Rings, NP       metoprolol succinate (TOPROL-XL) 24 hr tablet 50 mg  50 mg Oral Daily Sarina Ill, DO   50 mg at 08/13/22 8657   nicotine (NICODERM CQ - dosed in mg/24 hours) patch 21 mg  21 mg Transdermal Daily Kenyia Wambolt T, MD   21 mg at  08/13/22 0840   nicotine polacrilex (NICORETTE) gum 2 mg  2 mg Oral PRN Sarina Ill, DO       OLANZapine [facility]) tablet 10 mg  10 mg Oral BID PRN Charm Rings, NP   10 mg at 08/08/22 2117   Or   OLANZapine (ZYPREXA) injection 10 mg  10 mg Intramuscular BID PRN Charm Rings, NP       ondansetron [facility]) tablet 4 mg  4 mg Oral Q8H PRN Charm Rings, NP       topiramate (TOPAMAX) tablet 75 mg  75 mg Oral Daily Charm Rings, NP   75 mg at 08/13/22 0836   valbenazine (INGREZZA) capsule 80 mg  80 mg Oral Daily Sarina Ill, DO   80 mg at 08/13/22 8469    Lab Results: No results found for this or any previous visit (from the past 48 hour(s)).  Blood Alcohol level:  Lab Results  Component Value Date   ETH <10 08/04/2022   ETH <10 03/22/2021    Metabolic Disorder Labs: Lab Results  Component Value Date   HGBA1C 5.2 08/10/2022   MPG 103 08/10/2022   MPG 91 09/12/2016   Lab Results  Component Value Date   PROLACTIN 37.4 (H) 09/12/2016   Lab Results  Component Value Date   CHOL 142 08/10/2022   TRIG 79 08/10/2022   HDL 28 (L) 08/10/2022   CHOLHDL 5.1 08/10/2022   VLDL 16 08/10/2022   LDLCALC 98 08/10/2022   LDLCALC 73 09/12/2016    Physical Findings: AIMS: Facial and Oral Movements Muscles of Facial Expression: None, normal Lips and Perioral Area: None, normal Jaw: None, normal Tongue: None, normal,Extremity Movements Upper (arms, wrists, hands, fingers): None, normal Lower (legs, knees, ankles, toes): None, normal, Trunk Movements Neck, shoulders, hips: None, normal, Overall Severity Severity of abnormal movements (highest score from questions above): None, normal Incapacitation due to abnormal movements: None, normal Patient's awareness of abnormal movements (rate only patient's report): No Awareness, Dental Status Current problems with teeth and/or dentures?: No Does patient usually wear dentures?: No  CIWA:    COWS:      Musculoskeletal: Strength & Muscle Tone: within normal limits Gait & Station: normal Patient leans: N/A  Psychiatric Specialty Exam:  Presentation  General Appearance: No data recorded Eye Contact:No data recorded Speech:No data recorded Speech Volume:No data recorded Handedness:No data recorded  Mood and Affect  Mood:No data recorded  Affect:No data recorded  Thought Process  Thought Processes:No data recorded Descriptions of Associations:No data recorded Orientation:No data recorded Thought Content:No data recorded History of Schizophrenia/Schizoaffective disorder:Yes  Duration of Psychotic Symptoms:Greater than six months  Hallucinations:No data recorded Ideas of Reference:No data recorded Suicidal Thoughts:No data recorded Homicidal Thoughts:No data recorded  Sensorium  Memory:No data recorded Judgment:No data recorded Insight:No data recorded  Executive Functions  Concentration:No data recorded Attention Span:No data recorded Recall:No data recorded Fund of Knowledge:No data recorded Language:No data recorded  Psychomotor Activity  Psychomotor Activity:No data recorded  Assets  Assets:No data recorded  Sleep  Sleep:No data recorded   Physical Exam: Physical Exam Vitals and nursing note reviewed.  Constitutional:      Appearance: Normal appearance.  HENT:     Head: Normocephalic and atraumatic.     Mouth/Throat:     Pharynx: Oropharynx is clear.  Eyes:     Pupils: Pupils are equal, round, and reactive to light.  Cardiovascular:     Rate and Rhythm: Normal rate and regular rhythm.  Pulmonary:     Effort: Pulmonary effort is normal.     Breath sounds: Normal breath sounds.  Abdominal:     General: Abdomen is flat.     Palpations: Abdomen is soft.  Musculoskeletal:        General: Normal range of motion.  Skin:    General: Skin is warm and dry.  Neurological:     General: No focal deficit present.     Mental Status: She is alert.  Mental status is at baseline.  Psychiatric:        Attention and Perception: Attention normal.        Mood and Affect: Mood normal.        Speech: Speech normal.        Behavior: Behavior normal.        Thought Content: Thought content normal.        Cognition and Memory: Cognition normal.        Judgment: Judgment normal.    Review of Systems  Constitutional: Negative.   HENT: Negative.    Eyes: Negative.   Respiratory: Negative.    Cardiovascular: Negative.   Gastrointestinal: Negative.   Musculoskeletal: Negative.   Skin: Negative.   Neurological: Negative.   Psychiatric/Behavioral: Negative.     Blood pressure 112/77, pulse 77, temperature 98.2 F (36.8 C), temperature source Oral, resp. rate 20, height  (1.575 m), weight 93 kg, SpO2 97 %. Body mass index is 37.49 kg/m.   Treatment Plan Summary: Medication management and Plan no change to medication.  Patient appears to stabilize to be doing better.  Plan is for discharge tomorrow with referral to outpatient mental health and substance abuse treatment.  Mordecai Rasmussen, MD 08/13/2022, 11:56 AM

## 2022-08-13 NOTE — Group Note (Signed)
[facility] LCSW Group Therapy Note   Group Date: 08/13/2022 Start Time: 1330 End Time: 1400  Type of Therapy/Topic:  Group Therapy:  Feelings about Diagnosis  Participation Level:  Did Not Attend      Description of Group:    This group will allow patients to explore their thoughts and feelings about diagnoses they have received. Patients will be guided to explore their level of understanding and acceptance of these diagnoses. Facilitator will encourage patients to process their thoughts and feelings about the reactions of others to their diagnosis, and will guide patients in identifying ways to discuss their diagnosis with significant others in their lives. This group will be process-oriented, with patients participating in exploration of their own experiences as well as giving and receiving support and challenge from other group members.   Therapeutic Goals: 1. Patient will demonstrate understanding of diagnosis as evidence by identifying two or more symptoms of the disorder:  2. Patient will be able to express two feelings regarding the diagnosis 3. Patient will demonstrate ability to communicate their needs through discussion and/or role plays  Summary of Patient Progress:  X        Therapeutic Modalities:   Cognitive Behavioral Therapy Brief Therapy Feelings Identification    Codee Bloodworth S Manuelita Moxon, LCSW 

## 2022-08-14 ENCOUNTER — Other Ambulatory Visit: Payer: Self-pay

## 2022-08-14 DIAGNOSIS — F3112 Bipolar disorder, current episode manic without psychotic features, moderate: Secondary | ICD-10-CM | POA: Diagnosis not present

## 2022-08-14 MED ORDER — ARIPIPRAZOLE ER 400 MG IM SRER: 400.0000 mg | INTRAMUSCULAR | 1 refills | Status: DC

## 2022-08-14 MED ORDER — METOPROLOL SUCCINATE ER 50 MG PO TB24: 50.0000 mg | ORAL_TABLET | Freq: Every day | ORAL | 1 refills | Status: DC

## 2022-08-14 MED ORDER — METOPROLOL SUCCINATE ER 50 MG PO TB24
50.0000 mg | ORAL_TABLET | Freq: Every day | ORAL | 0 refills | Status: DC
  Filled 2022-08-14 – 2022-08-24 (×2): qty 10, 10d supply, fill #0

## 2022-08-14 MED ORDER — NICOTINE POLACRILEX 2 MG MT GUM
2.0000 mg | CHEWING_GUM | OROMUCOSAL | 0 refills | Status: DC | PRN
  Filled 2022-08-14: qty 50, 25d supply, fill #0

## 2022-08-14 MED ORDER — NICOTINE 21 MG/24HR TD PT24: 21.0000 mg | MEDICATED_PATCH | Freq: Every day | TRANSDERMAL | 1 refills | Status: DC

## 2022-08-14 MED ORDER — NICOTINE POLACRILEX 2 MG MT GUM: 2.0000 mg | CHEWING_GUM | OROMUCOSAL | 1 refills | Status: DC | PRN

## 2022-08-14 MED ORDER — TOPIRAMATE 25 MG PO TABS: 75.0000 mg | ORAL_TABLET | Freq: Every day | ORAL | 1 refills | Status: DC

## 2022-08-14 MED ORDER — INGREZZA 80 MG PO CAPS: 80.0000 mg | ORAL_CAPSULE | Freq: Every day | ORAL | 1 refills | Status: DC

## 2022-08-14 MED ORDER — TOPIRAMATE 25 MG PO TABS
75.0000 mg | ORAL_TABLET | Freq: Every day | ORAL | 0 refills | Status: DC
  Filled 2022-08-14 – 2022-08-24 (×2): qty 30, 10d supply, fill #0

## 2022-08-14 MED ORDER — ARIPIPRAZOLE 15 MG PO TABS: 15.0000 mg | ORAL_TABLET | Freq: Every day | ORAL | 1 refills | Status: DC

## 2022-08-14 MED ORDER — ARIPIPRAZOLE 15 MG PO TABS
15.0000 mg | ORAL_TABLET | Freq: Every day | ORAL | 0 refills | Status: DC
  Filled 2022-08-14: qty 10, 10d supply, fill #0

## 2022-08-14 MED ORDER — NICOTINE 21 MG/24HR TD PT24
21.0000 mg | MEDICATED_PATCH | Freq: Every day | TRANSDERMAL | 0 refills | Status: DC
  Filled 2022-08-14 – 2022-09-08 (×3): qty 14, 14d supply, fill #0

## 2022-08-14 NOTE — Group Note (Signed)
Date:  08/14/2022 Time:  10:10 PM  Group Topic/Focus:  Wrap-Up Group:   The focus of this group is to help patients review their daily goal of treatment and discuss progress on daily workbooks.    Participation Level:  Minimal  Participation Quality:  Appropriate  Affect:  Flat  Cognitive:  Alert  Insight: Appropriate  Engagement in Group:  Lacking  Modes of Intervention:  Limit-setting  Additional Comments:     Maglione,Anndee Connett E 08/14/2022, 10:10 PM

## 2022-08-14 NOTE — Plan of Care (Signed)

## 2022-08-14 NOTE — Group Note (Signed)
Recreation Therapy Group Note   Group Topic:Problem Solving  Group Date: 08/14/2022 Start Time: 1000 End Time: 1055 Facilitators: Rosina Lowenstein, LRT, CTRS Location:  Craft Room  Group Description: Life Boat. Patients were given the scenario that they are on a boat that is about to become shipwrecked, leaving them stranded on an [place]. They are asked to make a list of 15 different items that they want to take with them when they are stranded on the [place]. Patients are asked to rank their items from most important to least important, #1 being the most important and #15 being the least. Patients will work individually for the first round to come up with 15 items and then pair up with a peer(s) to condense their list and come up with one list of 15 items between the two of them. Patients or LRT will read aloud the 15 different items to the group after each round. LRT facilitated post-activity processing to discuss how this activity can be used in daily life post discharge.   Goal Area(s) Addressed:  Patient will identify priorities, wants and needs. Patient will communicate with LRT and peers. Patient will work collectively as a Administrator, Civil Service. Patient will work on Product manager.   Affect/Mood: N/A   Participation Level: Did not attend    Clinical Observations/Individualized Feedback: Janice Walls did not attend group due to resting in her room.  Plan: Continue to engage patient in RT group sessions 2-3x/week.   Rosina Lowenstein, LRT, CTRS 08/14/2022 11:22 AM

## 2022-08-14 NOTE — Discharge Summary (Signed)
Physician Discharge Summary Note  Patient:  Janice Walls is an 37 y.o., female MRN:  [id number] DOB:  15-Jul-1985 Patient phone:  [phone] (home)  Patient address:   [address] [place] [address],  Total Time spent with patient: 30 minutes  Date of Admission:  08/05/2022 Date of Discharge: 08/14/2022  Reason for Admission: Patient was admitted because of a return of manic symptoms manifesting as delusions hyperreligiosity aggression towards her stepfather disorganized behavior.  Manic and also using drugs.  Principal Problem: Bipolar affective disorder, currently manic, moderate Discharge Diagnoses: Principal Problem:   Bipolar affective disorder, currently manic, moderate   Past Psychiatric History: Past history of bipolar disorder with frequent manic episodes.  Some history of depression.  History of substance use.  Past Medical History:  Past Medical History:  Diagnosis Date   Anxiety    Bipolar 1 disorder    Depression    Insomnia    Substance abuse     Past Surgical History:  Procedure Laterality Date   CESAREAN SECTION     CESAREAN SECTION     Family History:  Family History  Problem Relation Age of Onset   CAD Father 29   Family Psychiatric  History: See previous Social History:  Social History   Substance and Sexual Activity  Alcohol Use Not Currently   Comment: rare     Social History   Substance and Sexual Activity  Drug Use Not Currently   Types: Marijuana, Methamphetamines    Social History   Socioeconomic History   Marital status: Single    Spouse name: Not on file   Number of children: Not on file   Years of education: Not on file   Highest education level: Not on file  Occupational History   Occupation: unemployed  Tobacco Use   Smoking status: Every Day    Packs/day: 1.00    Years: 18.00    Additional pack years: 0.00    Total pack years: 18.00    Types: Cigarettes   Smokeless tobacco: Never  Vaping Use    Vaping Use: Never used  Substance and Sexual Activity   Alcohol use: Not Currently    Comment: rare   Drug use: Not Currently    Types: Marijuana, Methamphetamines   Sexual activity: Yes    Birth control/protection: None  Other Topics Concern   Not on file  Social History Narrative   Not on file   Social Determinants of Health   Financial Resource Strain: Not on file  Food Insecurity: No Food Insecurity (08/05/2022)   Hunger Vital Sign    Worried About Running Out of Food in the Last Year: Never true    Ran Out of Food in the Last Year: Never true  Transportation Needs: No Transportation Needs (08/05/2022)   PRAPARE - Administrator, Civil Service (Medical): No    Lack of Transportation (Non-Medical): No  Physical Activity: Not on file  Stress: Not on file  Social Connections: Not on file    Hospital Course: Patient was admitted to the psychiatric unit under IVC after presenting because of aggression towards her stepfather and hyper religious psychosis.  Restarted on medications.  Initially agitated on the unit but calm down quickly.  Last couple days has been much better with lucid thinking calm behavior.  Denies any hallucinations or delusions.  Expresses insight about how substance use and noncompliance worsens her condition.  Agrees to recommended follow-up treatment.  Patient will be provided with  prescriptions and a supply of medicine.  Physical Findings: AIMS: Facial and Oral Movements Muscles of Facial Expression: None, normal Lips and Perioral Area: None, normal Jaw: None, normal Tongue: None, normal,Extremity Movements Upper (arms, wrists, hands, fingers): None, normal Lower (legs, knees, ankles, toes): None, normal, Trunk Movements Neck, shoulders, hips: None, normal, Overall Severity Severity of abnormal movements (highest score from questions above): None, normal Incapacitation due to abnormal movements: None, normal Patient's awareness of abnormal  movements (rate only patient's report): No Awareness, Dental Status Current problems with teeth and/or dentures?: No Does patient usually wear dentures?: No  CIWA:    COWS:     Musculoskeletal: Strength & Muscle Tone: within normal limits Gait & Station: normal Patient leans: N/A   Psychiatric Specialty Exam:  Presentation  General Appearance: No data recorded Eye Contact:No data recorded Speech:No data recorded Speech Volume:No data recorded Handedness:No data recorded  Mood and Affect  Mood:No data recorded Affect:No data recorded  Thought Process  Thought Processes:No data recorded Descriptions of Associations:No data recorded Orientation:No data recorded Thought Content:No data recorded History of Schizophrenia/Schizoaffective disorder:Yes  Duration of Psychotic Symptoms:Greater than six months  Hallucinations:No data recorded Ideas of Reference:No data recorded Suicidal Thoughts:No data recorded Homicidal Thoughts:No data recorded  Sensorium  Memory:No data recorded Judgment:No data recorded Insight:No data recorded  Executive Functions  Concentration:No data recorded Attention Span:No data recorded Recall:No data recorded Fund of Knowledge:No data recorded Language:No data recorded  Psychomotor Activity  Psychomotor Activity:No data recorded  Assets  Assets:No data recorded  Sleep  Sleep:No data recorded   Physical Exam: Physical Exam Vitals and nursing note reviewed.  Constitutional:      Appearance: Normal appearance.  HENT:     Head: Normocephalic and atraumatic.     Mouth/Throat:     Pharynx: Oropharynx is clear.  Eyes:     Pupils: Pupils are equal, round, and reactive to light.  Cardiovascular:     Rate and Rhythm: Normal rate and regular rhythm.  Pulmonary:     Effort: Pulmonary effort is normal.     Breath sounds: Normal breath sounds.  Abdominal:     General: Abdomen is flat.     Palpations: Abdomen is soft.   Musculoskeletal:        General: Normal range of motion.  Skin:    General: Skin is warm and dry.  Neurological:     General: No focal deficit present.     Mental Status: She is alert. Mental status is at baseline.  Psychiatric:        Attention and Perception: Attention normal.        Mood and Affect: Mood normal.        Speech: Speech normal.        Behavior: Behavior normal.        Thought Content: Thought content normal.        Cognition and Memory: Cognition normal.        Judgment: Judgment normal.    Review of Systems  Constitutional: Negative.   HENT: Negative.    Eyes: Negative.   Respiratory: Negative.    Cardiovascular: Negative.   Gastrointestinal: Negative.   Musculoskeletal: Negative.   Skin: Negative.   Neurological: Negative.   Psychiatric/Behavioral: Negative.     Blood pressure 117/68, pulse 69, temperature 98.4 F (36.9 C), temperature source Oral, resp. rate 18, height  (1.575 m), weight 93 kg, SpO2 99 %. Body mass index is 37.49 kg/m.   Social History   Tobacco  Use  Smoking Status Every Day   Packs/day: 1.00   Years: 18.00   Additional pack years: 0.00   Total pack years: 18.00   Types: Cigarettes  Smokeless Tobacco Never   Tobacco Cessation:  A prescription for an FDA-approved tobacco cessation medication provided at discharge   Blood Alcohol level:  Lab Results  Component Value Date   [facility] <10 08/04/2022   ETH <10 03/22/2021    Metabolic Disorder Labs:  Lab Results  Component Value Date   HGBA1C 5.2 08/10/2022   MPG 103 08/10/2022   MPG 91 09/12/2016   Lab Results  Component Value Date   PROLACTIN 37.4 (H) 09/12/2016   Lab Results  Component Value Date   CHOL 142 08/10/2022   TRIG 79 08/10/2022   HDL 28 (L) 08/10/2022   CHOLHDL 5.1 08/10/2022   VLDL 16 08/10/2022   LDLCALC 98 08/10/2022   LDLCALC 73 09/12/2016    See Psychiatric Specialty Exam and Suicide Risk Assessment completed by Attending Physician prior to  discharge.  Discharge destination:  Home  Is patient on multiple antipsychotic therapies at discharge:  No   Has Patient had three or more failed trials of antipsychotic monotherapy by history:  No  Recommended Plan for Multiple Antipsychotic Therapies: NA  Discharge Instructions     Diet - low sodium heart healthy   Complete by: As directed    Increase activity slowly   Complete by: As directed       Allergies as of 08/14/2022   No Known Allergies      Medication List     STOP taking these medications    Caplyta 42 MG capsule Generic drug: lumateperone tosylate   Haldol Decanoate 100 MG/ML injection Generic drug: haloperidol decanoate   naloxone 4 MG/0.1ML Liqd nasal spray kit Commonly known as: NARCAN       TAKE these medications      Indication  ARIPiprazole 15 MG tablet Commonly known as: ABILIFY Take 1 tablet (15 mg total) by mouth daily.  Indication: Manic Phase of Manic-Depression   ARIPiprazole ER 400 MG Srer injection Commonly known as: ABILIFY MAINTENA Inject 2 mLs (400 mg total) into the muscle every 28 (twenty-eight) days. Start taking on: Sep 06, 2022  Indication: Manic-Depression   Ingrezza 80 MG capsule Generic drug: valbenazine Take 1 capsule (80 mg total) by mouth daily.  Indication: Tardive Dyskinesia   metoprolol succinate 50 MG 24 hr tablet Commonly known as: TOPROL-XL Take 1 tablet (50 mg total) by mouth daily.  Indication: High Blood Pressure Disorder   nicotine 21 mg/24hr patch Commonly known as: NICODERM CQ - dosed in mg/24 hours Place 1 patch (21 mg total) onto the skin daily.  Indication: Nicotine Addiction   nicotine polacrilex 2 MG gum Commonly known as: NICORETTE Take 1 each (2 mg total) by mouth as needed for smoking cessation.  Indication: Nicotine Addiction   topiramate 25 MG tablet Commonly known as: TOPAMAX Take 3 tablets (75 mg total) by mouth daily.  Indication: Migraine Headache         Follow-up  recommendations:  Other:  Education provided and the patient was strongly encouraged to follow-up with outpatient mental health.  She is on her Abilify injection and also is to continue the oral Abilify for now.  She was not given a supply of Ingrezza due to the cost but prescription is provided.  Follow-up through her regular team.  Comments: See above  Signed: Mordecai Rasmussen, MD 08/14/2022, 10:21 AM

## 2022-08-14 NOTE — [facility] Counselor (Signed)
CSW contacted guardian/mother, Odessa Fleming [phone]). Foster expressed gratitude for call. She shared that pt complained of hearing and seeing people that were not there when guardian visited the other day. She shared that she does not feel that pt is ready for discharge at this time and requested contact from provider. CSW informed Malen Gauze that provider would be updated and given her information. She agreed. No other concerns expressed. Contact ended without incident.   CSW notified provider of guardian/mother's concerns via secure chat. Discharge for cancelled for today.   Malen Gauze called the unit asking for an update. CSW informed her that discharge had been cancelled for today. She asked if there was a new date at this time. CSW explained that the discharge was cancelled for today but the provider had not given a new date. She voiced understanding and just asked to be updated regarding discharge. CSW agreed. No other concerns expressed. Contact ended without incident.   Vilma Meckel. Algis Greenhouse, MSW, LCSW, LCAS 08/14/2022 3:41 PM

## 2022-08-14 NOTE — Progress Notes (Signed)
   08/14/22 2020  Psych Admission Type (Psych Patients Only)  Admission Status Involuntary  Psychosocial Assessment  Patient Complaints None  Eye Contact Fair  Facial Expression Flat  Affect Flat  Speech Logical/coherent  Interaction Minimal  Motor Activity Other (Comment) (wnl)  Appearance/Hygiene In scrubs  Behavior Characteristics Cooperative  Mood Pleasant  Thought Process  Coherency WDL  Content WDL  Delusions None reported or observed  Perception WDL  Hallucination None reported or observed  Judgment Impaired  Confusion None  Danger to Self  Current suicidal ideation? Denies  Danger to Others  Danger to Others None reported or observed   Progress note   D: Pt seen in bed. Pt denies SI, HI, AVH. Pt rates pain  0/10. Pt rates anxiety  0/10 and depression  0/10. Per report, pt was supposed to discharge today but discharge blocked by guardian. No other concerns noted at this time.  A: Pt provided support and encouragement. Pt given scheduled medication as prescribed. PRNs as appropriate. Q15 min checks for safety.   R: Pt safe on the unit. Will continue to monitor.

## 2022-08-14 NOTE — Progress Notes (Signed)
Patient alert and oriented x 4, affect is flat but she brighten upon approach, no distress noted she is interacting appropriately with peers and staff. Patient was offered  emotional support and encouragement and she also denies SI/HI/AVH. 15 minutes safety checks maintained will continue to monitor.

## 2022-08-14 NOTE — Progress Notes (Signed)
Pt denies SI/HI/AVH and verbally agrees to approach staff if these become apparent or before harming themselves/others. Rates depression 0/10. Rates anxiety 0/10. Rates pain 0/10. Pt has been in and out of her room for most of the day. Pt was supposed to d/c but guardian refused. Pt was upset and called her mother. Pt has been fine since. Pt has been reading a book this evening. Pt has come out for meals and v/s.Scheduled medications administered to pt, per MD orders. RN provided support and encouragement to pt. Q15 min safety checks implemented and continued. Pt is safe on the unit. Plan of care on going and no other concerns expressed at this time.  08/14/22 0831  Psych Admission Type (Psych Patients Only)  Admission Status Voluntary  Psychosocial Assessment  Patient Complaints None  Eye Contact Fair  Facial Expression Animated  Affect Appropriate to circumstance  Speech Logical/coherent  Interaction Assertive  Motor Activity Other (Comment) (WDL)  Appearance/Hygiene Disheveled;In scrubs  Behavior Characteristics Cooperative;Appropriate to situation;Calm  Mood Pleasant  Aggressive Behavior  Effect No apparent injury  Thought Process  Coherency WDL  Content WDL  Delusions None reported or observed  Perception WDL  Hallucination None reported or observed  Judgment Impaired  Confusion None  Danger to Self  Current suicidal ideation? Denies  Danger to Others  Danger to Others None reported or observed

## 2022-08-14 NOTE — Group Note (Signed)
Date:  08/14/2022 Time:  6:30 PM  Group Topic/Focus:  Activity Group    Participation Level:  Active  Participation Quality:  Appropriate  Affect:  Appropriate  Cognitive:  Appropriate  Insight: Appropriate  Engagement in Group:  Engaged  Modes of Intervention:  Activity  Additional Comments:    Lynelle Smoke Rabia Argote 08/14/2022, 6:30 PM

## 2022-08-14 NOTE — [facility] Suicide Risk Assessment (Signed)
[facility] Discharge Suicide Risk Assessment   Principal Problem: Bipolar affective disorder, currently manic, moderate Discharge Diagnoses: Principal Problem:   Bipolar affective disorder, currently manic, moderate   Total Time spent with patient: 30 minutes  Musculoskeletal: Strength & Muscle Tone: within normal limits Gait & Station: normal Patient leans: N/A  Psychiatric Specialty Exam  Presentation  General Appearance: No data recorded Eye Contact:No data recorded Speech:No data recorded Speech Volume:No data recorded Handedness:No data recorded  Mood and Affect  Mood:No data recorded Duration of Depression Symptoms: Greater than two weeks  Affect:No data recorded  Thought Process  Thought Processes:No data recorded Descriptions of Associations:No data recorded Orientation:No data recorded Thought Content:No data recorded History of Schizophrenia/Schizoaffective disorder:Yes  Duration of Psychotic Symptoms:Greater than six months  Hallucinations:No data recorded Ideas of Reference:No data recorded Suicidal Thoughts:No data recorded Homicidal Thoughts:No data recorded  Sensorium  Memory:No data recorded Judgment:No data recorded Insight:No data recorded  Executive Functions  Concentration:No data recorded Attention Span:No data recorded Recall:No data recorded Fund of Knowledge:No data recorded Language:No data recorded  Psychomotor Activity  Psychomotor Activity:No data recorded  Assets  Assets:No data recorded  Sleep  Sleep:No data recorded  Physical Exam: Physical Exam Vitals and nursing note reviewed.  Constitutional:      Appearance: Normal appearance.  HENT:     Head: Normocephalic and atraumatic.     Mouth/Throat:     Pharynx: Oropharynx is clear.  Eyes:     Pupils: Pupils are equal, round, and reactive to light.  Cardiovascular:     Rate and Rhythm: Normal rate and regular rhythm.  Pulmonary:     Effort: Pulmonary effort is normal.      Breath sounds: Normal breath sounds.  Abdominal:     General: Abdomen is flat.     Palpations: Abdomen is soft.  Musculoskeletal:        General: Normal range of motion.  Skin:    General: Skin is warm and dry.  Neurological:     General: No focal deficit present.     Mental Status: She is alert. Mental status is at baseline.  Psychiatric:        Attention and Perception: Attention normal.        Mood and Affect: Mood normal.        Speech: Speech normal.        Behavior: Behavior normal.        Thought Content: Thought content normal.        Cognition and Memory: Cognition normal.        Judgment: Judgment normal.    Review of Systems  Constitutional: Negative.   HENT: Negative.    Eyes: Negative.   Respiratory: Negative.    Cardiovascular: Negative.   Gastrointestinal: Negative.   Musculoskeletal: Negative.   Skin: Negative.   Neurological: Negative.   Psychiatric/Behavioral: Negative.     Blood pressure 117/68, pulse 69, temperature 98.4 F (36.9 C), temperature source Oral, resp. rate 18, height 5\' 2"  (1.575 m), weight 93 kg, SpO2 99 %. Body mass index is 37.49 kg/m.  Mental Status Per Nursing Assessment::   On Admission:  NA  Demographic Factors:  Caucasian and Living alone  Loss Factors: Financial problems/change in socioeconomic status  Historical Factors: Impulsivity  Risk Reduction Factors:   Positive therapeutic relationship  Continued Clinical Symptoms:  Bipolar Disorder:   Mixed State  Cognitive Features That Contribute To Risk:  None    Suicide Risk:  Minimal: No identifiable suicidal ideation.  Patients  presenting with no risk factors but with morbid ruminations; may be classified as minimal risk based on the severity of the depressive symptoms    Plan Of Care/Follow-up recommendations:  Other:  Patient is not showing active signs of psychosis.  She is clear in her thinking and denies hallucinations and does not appear to be  responding to internal stimuli.  She is able to hold appropriate conversations about treatment.  Understands that cocaine use and bipolar disorder need to be treated more aggressively to avoid further negative outcome.  Patient agrees to recommended follow-up treatment prescriptions provided as well as supplies.  Mordecai Rasmussen, MD 08/14/2022, 10:16 AM

## 2022-08-14 NOTE — Group Note (Signed)
[facility] LCSW Group Therapy Note    Group Date: 08/14/2022 Start Time: 1330 End Time: 1415  Type of Therapy and Topic:  Group Therapy:  Overcoming Obstacles  Participation Level:  [facility] PARTICIPATION LEVEL: Minimal   Description of Group:   In this group patients will be encouraged to explore what they see as obstacles to their own wellness and recovery. They will be guided to discuss their thoughts, feelings, and behaviors related to these obstacles. The group will process together ways to cope with barriers, with attention given to specific choices patients can make. Each patient will be challenged to identify changes they are motivated to make in order to overcome their obstacles. This group will be process-oriented, with patients participating in exploration of their own experiences as well as giving and receiving support and challenge from other group members.  Therapeutic Goals: 1. Patient will identify personal and current obstacles as they relate to admission. 2. Patient will identify barriers that currently interfere with their wellness or overcoming obstacles.  3. Patient will identify feelings, thought process and behaviors related to these barriers. 4. Patient will identify two changes they are willing to make to overcome these obstacles:    Summary of Patient Progress Patient was present for a bit of the group process. She participated in the icebreaker. Pt also identified thoughts about her mother dying in the future as an obstacle that she needs to overcome. She did not speak further in the larger discussion and left for a significant span of time during the middle of the discussion. Pt insight into topic is questionable. She did, however, appear open and receptive to feedback/comments from both peers and facilitator.    Therapeutic Modalities:   Cognitive Behavioral Therapy Solution Focused Therapy Motivational Interviewing Relapse  Prevention Therapy   Glenis Smoker, LCSW

## 2022-08-15 DIAGNOSIS — F3112 Bipolar disorder, current episode manic without psychotic features, moderate: Secondary | ICD-10-CM | POA: Diagnosis not present

## 2022-08-15 MED ORDER — LITHIUM CARBONATE ER 300 MG PO TBCR
300.0000 mg | EXTENDED_RELEASE_TABLET | Freq: Two times a day (BID) | ORAL | Status: DC
  Administered 2022-08-15 – 2022-08-22 (×15): 300 mg via ORAL
  Filled 2022-08-15 (×15): qty 1

## 2022-08-15 MED ORDER — ARIPIPRAZOLE 10 MG PO TABS
20.0000 mg | ORAL_TABLET | Freq: Every day | ORAL | Status: DC
  Administered 2022-08-16 – 2022-08-22 (×7): 20 mg via ORAL
  Filled 2022-08-15 (×7): qty 2

## 2022-08-15 NOTE — Group Note (Signed)
Date:  08/15/2022 Time:  6:32 PM  Group Topic/Focus:  Activity Group    Participation Level:  Active  Participation Quality:  Appropriate  Affect:  Appropriate  Cognitive:  Appropriate  Insight: Appropriate  Engagement in Group:  Engaged  Modes of Intervention:  Activity  Additional Comments:    Andersen Mckiver Travis Alean Kromer 08/15/2022, 6:32 PM  

## 2022-08-15 NOTE — Progress Notes (Signed)
[facility] MD Progress Note  08/15/2022 12:42 PM Janice Walls  MRN:  [id number] Subjective: Follow-up 37 year old woman with bipolar disorder.  Patient sat down to my office with me today and had a little bit of a longer talk and it became more clear how psychotic she still is.  She started out by telling me that she is the judge of the county and then started talking very bizarrely using words in ways that did not make any sense giggling. Principal Problem: Bipolar affective disorder, currently manic, moderate Diagnosis: Principal Problem:   Bipolar affective disorder, currently manic, moderate  Total Time spent with patient: 30 minutes  Past Psychiatric History: Past history of bipolar disorder  Past Medical History:  Past Medical History:  Diagnosis Date   Anxiety    Bipolar 1 disorder    Depression    Insomnia    Substance abuse     Past Surgical History:  Procedure Laterality Date   CESAREAN SECTION     CESAREAN SECTION     Family History:  Family History  Problem Relation Age of Onset   CAD Father 4   Family Psychiatric  History: See previous Social History:  Social History   Substance and Sexual Activity  Alcohol Use Not Currently   Comment: rare     Social History   Substance and Sexual Activity  Drug Use Not Currently   Types: Marijuana, Methamphetamines    Social History   Socioeconomic History   Marital status: Single    Spouse name: Not on file   Number of children: Not on file   Years of education: Not on file   Highest education level: Not on file  Occupational History   Occupation: unemployed  Tobacco Use   Smoking status: Every Day    Packs/day: 1.00    Years: 18.00    Additional pack years: 0.00    Total pack years: 18.00    Types: Cigarettes   Smokeless tobacco: Never  Vaping Use   Vaping Use: Never used  Substance and Sexual Activity   Alcohol use: Not Currently    Comment: rare   Drug use: Not Currently    Types: Marijuana,  Methamphetamines   Sexual activity: Yes    Birth control/protection: None  Other Topics Concern   Not on file  Social History Narrative   Not on file   Social Determinants of Health   Financial Resource Strain: Not on file  Food Insecurity: No Food Insecurity (08/05/2022)   Hunger Vital Sign    Worried About Running Out of Food in the Last Year: Never true    Ran Out of Food in the Last Year: Never true  Transportation Needs: No Transportation Needs (08/05/2022)   PRAPARE - Administrator, Civil Service (Medical): No    Lack of Transportation (Non-Medical): No  Physical Activity: Not on file  Stress: Not on file  Social Connections: Not on file   Additional Social History:                         Sleep: Fair  Appetite:  Fair  Current Medications: Current Facility-Administered Medications  Medication Dose Route Frequency Provider Last Rate Last Admin   acetaminophen (TYLENOL) tablet 650 mg  650 mg Oral Q6H PRN Charm Rings, NP       alum & mag hydroxide-simeth (MAALOX/MYLANTA) 200-200-20 MG/5ML suspension 30 mL  30 mL Oral Q6H PRN Charm Rings, NP  30 mL at 08/08/22 1846   [START ON 08/16/2022] ARIPiprazole (ABILIFY) tablet 20 mg  20 mg Oral Daily Damon Baisch T, MD       ARIPiprazole ER (ABILIFY MAINTENA) injection 400 mg  400 mg Intramuscular Q28 days Marqueta Pulley T, MD   400 mg at 08/09/22 1726   diphenhydrAMINE (BENADRYL) capsule 50 mg  50 mg Oral BID PRN Charm Rings, NP   50 mg at 08/13/22 2128   Or   diphenhydrAMINE (BENADRYL) injection 50 mg  50 mg Intravenous BID PRN Charm Rings, NP       ibuprofen (ADVIL) tablet 600 mg  600 mg Oral Q8H PRN Charm Rings, NP       lithium carbonate (LITHOBID) ER tablet 300 mg  300 mg Oral Q12H Mehmet Scally T, MD   300 mg at 08/15/22 1124   magnesium hydroxide (MILK OF MAGNESIA) suspension 30 mL  30 mL Oral Daily PRN Charm Rings, NP       metoprolol succinate (TOPROL-XL) 24 hr tablet 50 mg  50  mg Oral Daily Sarina Ill, DO   50 mg at 08/14/22 0831   nicotine (NICODERM CQ - dosed in mg/24 hours) patch 21 mg  21 mg Transdermal Daily Sallie Staron, Jackquline Denmark, MD   21 mg at 08/15/22 4098   nicotine polacrilex (NICORETTE) gum 2 mg  2 mg Oral PRN Sarina Ill, DO       OLANZapine [facility]) tablet 10 mg  10 mg Oral BID PRN Charm Rings, NP   10 mg at 08/08/22 2117   Or   OLANZapine (ZYPREXA) injection 10 mg  10 mg Intramuscular BID PRN Charm Rings, NP       ondansetron [facility]) tablet 4 mg  4 mg Oral Q8H PRN Charm Rings, NP       topiramate (TOPAMAX) tablet 75 mg  75 mg Oral Daily Charm Rings, NP   75 mg at 08/15/22 0818   valbenazine (INGREZZA) capsule 80 mg  80 mg Oral Daily Sarina Ill, DO   80 mg at 08/15/22 0818    Lab Results: No results found for this or any previous visit (from the past 48 hour(s)).  Blood Alcohol level:  Lab Results  Component Value Date   ETH <10 08/04/2022   ETH <10 03/22/2021    Metabolic Disorder Labs: Lab Results  Component Value Date   HGBA1C 5.2 08/10/2022   MPG 103 08/10/2022   MPG 91 09/12/2016   Lab Results  Component Value Date   PROLACTIN 37.4 (H) 09/12/2016   Lab Results  Component Value Date   CHOL 142 08/10/2022   TRIG 79 08/10/2022   HDL 28 (L) 08/10/2022   CHOLHDL 5.1 08/10/2022   VLDL 16 08/10/2022   LDLCALC 98 08/10/2022   LDLCALC 73 09/12/2016    Physical Findings: AIMS: Facial and Oral Movements Muscles of Facial Expression: None, normal Lips and Perioral Area: None, normal Jaw: None, normal Tongue: None, normal,Extremity Movements Upper (arms, wrists, hands, fingers): None, normal Lower (legs, knees, ankles, toes): None, normal, Trunk Movements Neck, shoulders, hips: None, normal, Overall Severity Severity of abnormal movements (highest score from questions above): None, normal Incapacitation due to abnormal movements: None, normal Patient's awareness of abnormal movements  (rate only patient's report): No Awareness, Dental Status Current problems with teeth and/or dentures?: No Does patient usually wear dentures?: No  CIWA:    COWS:     Musculoskeletal: Strength & Muscle  Tone: within normal limits Gait & Station: normal Patient leans: N/A  Psychiatric Specialty Exam:  Presentation  General Appearance: No data recorded Eye Contact:No data recorded Speech:No data recorded Speech Volume:No data recorded Handedness:No data recorded  Mood and Affect  Mood:No data recorded Affect:No data recorded  Thought Process  Thought Processes:No data recorded Descriptions of Associations:No data recorded Orientation:No data recorded Thought Content:No data recorded History of Schizophrenia/Schizoaffective disorder:Yes  Duration of Psychotic Symptoms:Greater than six months  Hallucinations:No data recorded Ideas of Reference:No data recorded Suicidal Thoughts:No data recorded Homicidal Thoughts:No data recorded  Sensorium  Memory:No data recorded Judgment:No data recorded Insight:No data recorded  Executive Functions  Concentration:No data recorded Attention Span:No data recorded Recall:No data recorded Fund of Knowledge:No data recorded Language:No data recorded  Psychomotor Activity  Psychomotor Activity:No data recorded  Assets  Assets:No data recorded  Sleep  Sleep:No data recorded   Physical Exam: Physical Exam Vitals and nursing note reviewed.  Constitutional:      Appearance: Normal appearance.  HENT:     Head: Normocephalic and atraumatic.     Mouth/Throat:     Pharynx: Oropharynx is clear.  Eyes:     Pupils: Pupils are equal, round, and reactive to light.  Cardiovascular:     Rate and Rhythm: Normal rate and regular rhythm.  Pulmonary:     Effort: Pulmonary effort is normal.     Breath sounds: Normal breath sounds.  Abdominal:     General: Abdomen is flat.     Palpations: Abdomen is soft.  Musculoskeletal:         General: Normal range of motion.  Skin:    General: Skin is warm and dry.  Neurological:     General: No focal deficit present.     Mental Status: She is alert. Mental status is at baseline.  Psychiatric:        Attention and Perception: Attention normal.        Mood and Affect: Mood normal. Affect is inappropriate.        Speech: Speech is tangential.        Behavior: Behavior is cooperative.        Thought Content: Thought content is paranoid and delusional.        Cognition and Memory: Cognition is impaired.    Review of Systems  Constitutional: Negative.   HENT: Negative.    Eyes: Negative.   Respiratory: Negative.    Cardiovascular: Negative.   Gastrointestinal: Negative.   Musculoskeletal: Negative.   Skin: Negative.   Neurological: Negative.   Psychiatric/Behavioral: Negative.     Blood pressure 99/68, pulse (!) 57, temperature 97.7 F (36.5 C), temperature source Oral, resp. rate 18, height  (1.575 m), weight 93 kg, SpO2 98 %. Body mass index is 37.49 kg/m.   Treatment Plan Summary: Medication management and Plan she is not violent or threatening but I can see talking to her a little more why her mother might be concerned about her coming home.  Clearly still having disorganized thoughts.  Reviewed her past chart and the Abilify does seem to have been helpful in the past but usually she was on lithium as well.  Kidney function is fine and so I am restarting the lithium.  Mordecai Rasmussen, MD 08/15/2022, 12:42 PM

## 2022-08-15 NOTE — Group Note (Signed)
Recreation Therapy Group Note   Group Topic:Healthy Support Systems  Group Date: 08/15/2022 Start Time: 1010 End Time: 1100 Facilitators: Rosina Lowenstein, LRT, CTRS Location:  Craft Room  Group Description: Straw Bridge. Patients were given 10 plastic drinking straws and an equal length of masking tape. Using the materials provided, patients were instructed to build a free-standing bridge-like structure to suspend an everyday item (ex: deck of cards) off the floor or table surface. All materials were required to be used in Secondary school teacher. LRT facilitated post-activity discussion reviewing the importance of having strong and healthy support systems in our lives. LRT discussed how the people in our lives serve as the tape and the book we placed on top of our straw structure are the stressors we face in daily life. LRT and pts discussed what happens in our life when things get too heavy for [place], and we don't have strong supports outside of the hospital. Pt shared 2 of their healthy supports aloud in the group.   Goal Area(s) Addressed:  Patient will identify 2 healthy supports in their life. Patient will identify skills to successfully complete activity. Patient will identify correlation of this activity to life post-discharge.    Affect/Mood: Appropriate   Participation Level: Moderate   Participation Quality: Independent   Behavior: Appropriate, Calm, and Hesitant   Speech/Thought Process: Coherent   Insight: Limited   Judgement: Limited   Modes of Intervention: Activity   Patient Response to Interventions:  Disengaged   Education Outcome:  In group clarification offered    Clinical Observations/Individualized Feedback: Janice Walls was minimally active in their participation of session activities and group discussion. Pt identified "my mom and talking or listening to others. It helps me." Pt sat there still and did not complete activity. Pt shared "I have never been good at things  like this. LRT provided encouragement and active listening, however pt did not engage in activity at all. Pt answered questions when directly promoted. Pt interacted well with LRT and peers while in group.  Plan: Continue to engage patient in RT group sessions 2-3x/week.   Rosina Lowenstein, LRT, CTRS 08/15/2022 11:35 AM

## 2022-08-15 NOTE — Group Note (Signed)
[facility] LCSW Group Therapy Note   Group Date: 08/15/2022 Start Time: 1315 End Time: 1420   Type of Therapy/Topic:  Group Therapy:  Emotion Regulation  Participation Level:  Did Not Attend    Description of Group:    The purpose of this group is to assist patients in learning to regulate negative emotions and experience positive emotions. Patients will be guided to discuss ways in which they have been vulnerable to their negative emotions. These vulnerabilities will be juxtaposed with experiences of positive emotions or situations, and patients challenged to use positive emotions to combat negative ones. Special emphasis will be placed on coping with negative emotions in conflict situations, and patients will process healthy conflict resolution skills.  Therapeutic Goals: Patient will identify two positive emotions or experiences to reflect on in order to balance out negative emotions:  Patient will label two or more emotions that they find the most difficult to experience:  Patient will be able to demonstrate positive conflict resolution skills through discussion or role plays:   Summary of Patient Progress: X   Therapeutic Modalities:   Cognitive Behavioral Therapy Feelings Identification Dialectical Behavioral Therapy   Roxane [place] Lalani Winkles, LCSW 

## 2022-08-15 NOTE — Progress Notes (Signed)
Pt denies SI/HI/AVH and verbally agrees to approach staff if these become apparent or before harming themselves/others. Rates depression 0/10. Rates anxiety 0/10. Rates pain 0/10.  Pt came to med room with dried blood all over hand and wrist band. Pt stated that it was her period blood and she just forgot to wash her hands. Pt asked to go wash her hands after she was given her medications. Pt has been responding to internal stimuli. Pt was pacing the unit when the looked into an empty room, smiled and waved, then kept walking. Pt has been in and out of her room throughout the day. Scheduled medications administered to pt, per MD orders. RN provided support and encouragement to pt. Q15 min safety checks implemented and continued. Pt is safe on the unit. Plan of care on going and no other concerns expressed at this time.  08/15/22 0819  Psych Admission Type (Psych Patients Only)  Admission Status Involuntary  Psychosocial Assessment  Patient Complaints None  Eye Contact Fair  Facial Expression Animated  Affect Appropriate to circumstance  Speech Logical/coherent  Interaction Assertive  Motor Activity Other (Comment) (WDL)  Appearance/Hygiene In scrubs;Disheveled;Poor Media planner Cooperative;Appropriate to situation;Calm  Mood Pleasant  Aggressive Behavior  Effect No apparent injury  Thought Process  Coherency Disorganized  Content WDL  Delusions None reported or observed  Perception Hallucinations  Hallucination None reported or observed  Judgment Impaired  Confusion Mild  Danger to Self  Current suicidal ideation? Denies  Danger to Others  Danger to Others None reported or observed

## 2022-08-15 NOTE — Group Note (Signed)
Date:  08/15/2022 Time:  9:57 PM  Group Topic/Focus:  Wrap-Up Group:   The focus of this group is to help patients review their daily goal of treatment and discuss progress on daily workbooks.    Participation Level:  Minimal  Participation Quality:  Appropriate  Affect:  Appropriate  Cognitive:  Alert  Insight: Good and Improving  Engagement in Group:  Improving  Modes of Intervention:  Activity  Additional Comments:     Maglione,Lisbet Busker E 08/15/2022, 9:57 PM

## 2022-08-15 NOTE — Group Note (Signed)
Date:  08/15/2022 Time:  6:53 PM  Group Topic/Focus:  Goals Group:   The focus of this group is to help patients establish daily goals to achieve during treatment and discuss how the patient can incorporate goal setting into their daily lives to aide in recovery.  Community Group    Participation Level:  Active  Participation Quality:  Appropriate  Affect:  Appropriate  Cognitive:  Alert and Appropriate  Insight: Appropriate and Good  Engagement in Group:  Engaged  Modes of Intervention:  Activity and Discussion  Additional Comments:    Doug Sou 08/15/2022, 6:53 PM

## 2022-08-15 NOTE — Plan of Care (Signed)
  Problem: Education: Goal: Knowledge of [place] [facility] Education information/materials will improve Outcome: Progressing Goal: Emotional status will improve Outcome: Progressing Goal: Mental status will improve Outcome: Progressing   

## 2022-08-16 DIAGNOSIS — F3112 Bipolar disorder, current episode manic without psychotic features, moderate: Secondary | ICD-10-CM | POA: Diagnosis not present

## 2022-08-16 NOTE — Progress Notes (Signed)
   08/15/22 2030  Psych Admission Type (Psych Patients Only)  Admission Status Involuntary  Psychosocial Assessment  Patient Complaints None  Eye Contact Fair  Facial Expression Animated  Affect Preoccupied  Speech Logical/coherent  Interaction Assertive  Motor Activity Other (Comment) (wnl)  Appearance/Hygiene Disheveled  Behavior Characteristics Cooperative;Appropriate to situation  Mood Preoccupied;Pleasant  Thought Process  Coherency Disorganized  Content Preoccupation  Delusions None reported or observed  Perception Hallucinations  Hallucination Visual  Judgment Impaired  Confusion None  Danger to Self  Current suicidal ideation? Denies  Danger to Others  Danger to Others None reported or observed   Progress note   D: Pt seen in her room. Pt denies SI, HI, AVH. Pt rates pain  0/10. Pt rates anxiety  0/10 and depression  0/10. Pt coming out of the bathroom. Pt states that something "hairy" just came out of her in the bathroom and his name is Aurther Loft. "Something just happened in the bathroom. There was something that came out of me. It was Terry's head. I disposed of the rest of his parts in that room over there (pointing across the hallway). I also found a four-leaf clover too. I have friends who are schizophrenic so I know how to do it." Pt does not remember discussing her new medication with provider today. Educated pt on medication uses and precautions. No questions asked. No other concerns noted at this time.  A: Pt provided support and encouragement. Pt given scheduled medication as prescribed. PRNs as appropriate. Q15 min checks for safety.   R: Pt safe on the unit. Will continue to monitor.

## 2022-08-16 NOTE — Group Note (Signed)
[facility] LCSW Group Therapy Note   Group Date: 08/16/2022 Start Time: 1315 End Time: 1415   Type of Therapy/Topic:  Group Therapy:  Balance in Life  Participation Level:  Did Not Attend   Description of Group:    This group will address the concept of balance and how it feels and looks when one is unbalanced. Patients will be encouraged to process areas in their lives that are out of balance, and identify reasons for remaining unbalanced. Facilitators will guide patients utilizing problem- solving interventions to address and correct the stressor making their life unbalanced. Understanding and applying boundaries will be explored and addressed for obtaining  and maintaining a balanced life. Patients will be encouraged to explore ways to assertively make their unbalanced needs known to significant others in their lives, using other group members and facilitator for support and feedback.  Therapeutic Goals: Patient will identify two or more emotions or situations they have that consume much of in their lives. Patient will identify signs/triggers that life has become out of balance:  Patient will identify two ways to set boundaries in order to achieve balance in their lives:  Patient will demonstrate ability to communicate their needs through discussion and/or role plays  Summary of Patient Progress: X   Therapeutic Modalities:   Cognitive Behavioral Therapy Solution-Focused Therapy Assertiveness Training   Ziaire Hagos R Danie Diehl, LCSW 

## 2022-08-16 NOTE — Plan of Care (Signed)
D: Patient alert and oriented. Patient denies pain. Patient denies anxiety and depression. Patient denies SI/HI/AVH.  A: Scheduled medications administered to patient, per MD orders.  Support and encouragement provided to patient.  Q15 minute safety checks maintained.   R: Patient compliant with medication administration and treatment plan. No adverse drug reactions noted. Patient remains safe on the unit at this time. Problem: Education: Goal: Knowledge of [place] [facility] Education information/materials will improve Outcome: Progressing Goal: Verbalization of understanding the information provided will improve Outcome: Progressing   Problem: Physical Regulation: Goal: Ability to maintain clinical measurements within normal limits will improve Outcome: Progressing

## 2022-08-16 NOTE — Progress Notes (Signed)
Patient continues to be psychotic at times. Asked if we could convince her friend to get a urinalysis so that it can help fix her and her children. Patient non-sensical. Denies Si, HI, AVH. Disorganized, bizarre.  Encouragement and support provided. Safety checks maintained. Meds given as prescribed. Pt receptive and remains safe on unit with q 15 min checks.

## 2022-08-16 NOTE — Group Note (Signed)
Recreation Therapy Group Note   Group Topic:Coping Skills  Group Date: 08/16/2022 Start Time: 1000 End Time: 1100 Facilitators: Rosina Lowenstein, LRT, CTRS Location:  Craft Room  Group Description: Mind Map.  Patient was provided a blank template of a diagram with 32 blank boxes in a tiered system, branching from the center (similar to a bubble chart). LRT directed patients to label the middle of the diagram "Coping Skills". LRT and patients then came up with 8 different coping skills as examples. Pt were directed to record their coping skills in the 2nd tier boxes closest to the center.  Patients would then share their coping skills with the group as LRT wrote them out. LRT gave a handout of 100 different coping skills at the end of group.   Goal Area(s) Addressed: Patients will be able to define "coping skills". Patient will identify new coping skills.  Patient will identify new possible leisure interests.   Affect/Mood: N/A   Participation Level: Did not attend    Clinical Observations/Individualized Feedback: Janice Walls did not attend group due to resting in her room.  Plan: Continue to engage patient in RT group sessions 2-3x/week.   Rosina Lowenstein, LRT, CTRS 08/16/2022 12:43 PM

## 2022-08-16 NOTE — Progress Notes (Signed)
[facility] MD Progress Note  08/16/2022 10:53 AM Janice Walls  MRN:  [id number] Subjective: Follow-up patient with bipolar disorder.  Patient today so far has not been up enough to display much behavior.  Denies hallucinations.  Still disorganized with odd thinking and behavior.  I let her know today why I had restarted her lithium. Principal Problem: Bipolar affective disorder, currently manic, moderate Diagnosis: Principal Problem:   Bipolar affective disorder, currently manic, moderate  Total Time spent with patient: 30 minutes  Past Psychiatric History: Past history of bipolar disorder with psychosis  Past Medical History:  Past Medical History:  Diagnosis Date   Anxiety    Bipolar 1 disorder    Depression    Insomnia    Substance abuse     Past Surgical History:  Procedure Laterality Date   CESAREAN SECTION     CESAREAN SECTION     Family History:  Family History  Problem Relation Age of Onset   CAD Father 20   Family Psychiatric  History: See previous Social History:  Social History   Substance and Sexual Activity  Alcohol Use Not Currently   Comment: rare     Social History   Substance and Sexual Activity  Drug Use Not Currently   Types: Marijuana, Methamphetamines    Social History   Socioeconomic History   Marital status: Single    Spouse name: Not on file   Number of children: Not on file   Years of education: Not on file   Highest education level: Not on file  Occupational History   Occupation: unemployed  Tobacco Use   Smoking status: Every Day    Packs/day: 1.00    Years: 18.00    Additional pack years: 0.00    Total pack years: 18.00    Types: Cigarettes   Smokeless tobacco: Never  Vaping Use   Vaping Use: Never used  Substance and Sexual Activity   Alcohol use: Not Currently    Comment: rare   Drug use: Not Currently    Types: Marijuana, Methamphetamines   Sexual activity: Yes    Birth control/protection: None  Other Topics Concern    Not on file  Social History Narrative   Not on file   Social Determinants of Health   Financial Resource Strain: Not on file  Food Insecurity: No Food Insecurity (08/05/2022)   Hunger Vital Sign    Worried About Running Out of Food in the Last Year: Never true    Ran Out of Food in the Last Year: Never true  Transportation Needs: No Transportation Needs (08/05/2022)   PRAPARE - Administrator, Civil Service (Medical): No    Lack of Transportation (Non-Medical): No  Physical Activity: Not on file  Stress: Not on file  Social Connections: Not on file   Additional Social History:                         Sleep: Fair  Appetite:  Fair  Current Medications: Current Facility-Administered Medications  Medication Dose Route Frequency Provider Last Rate Last Admin   acetaminophen (TYLENOL) tablet 650 mg  650 mg Oral Q6H PRN Charm Rings, NP       alum & mag hydroxide-simeth (MAALOX/MYLANTA) 200-200-20 MG/5ML suspension 30 mL  30 mL Oral Q6H PRN Charm Rings, NP   30 mL at 08/08/22 1846   ARIPiprazole (ABILIFY) tablet 20 mg  20 mg Oral Daily Mikaella Escalona, Jackquline Denmark, MD  ARIPiprazole ER (ABILIFY MAINTENA) injection 400 mg  400 mg Intramuscular Q28 days Zoejane Gaulin T, MD   400 mg at 08/09/22 1726   diphenhydrAMINE (BENADRYL) capsule 50 mg  50 mg Oral BID PRN Charm Rings, NP   50 mg at 08/13/22 2128   Or   diphenhydrAMINE (BENADRYL) injection 50 mg  50 mg Intravenous BID PRN Charm Rings, NP       ibuprofen (ADVIL) tablet 600 mg  600 mg Oral Q8H PRN Charm Rings, NP       lithium carbonate (LITHOBID) ER tablet 300 mg  300 mg Oral Q12H Syra Sirmons T, MD   300 mg at 08/15/22 2030   magnesium hydroxide (MILK OF MAGNESIA) suspension 30 mL  30 mL Oral Daily PRN Charm Rings, NP       metoprolol succinate (TOPROL-XL) 24 hr tablet 50 mg  50 mg Oral Daily Sarina Ill, DO   50 mg at 08/14/22 0831   nicotine (NICODERM CQ - dosed in mg/24 hours) patch  21 mg  21 mg Transdermal Daily Raesha Coonrod, Jackquline Denmark, MD   21 mg at 08/15/22 2952   nicotine polacrilex (NICORETTE) gum 2 mg  2 mg Oral PRN Sarina Ill, DO       OLANZapine [facility]) tablet 10 mg  10 mg Oral BID PRN Charm Rings, NP   10 mg at 08/08/22 2117   Or   OLANZapine (ZYPREXA) injection 10 mg  10 mg Intramuscular BID PRN Charm Rings, NP       ondansetron [facility]) tablet 4 mg  4 mg Oral Q8H PRN Charm Rings, NP       topiramate (TOPAMAX) tablet 75 mg  75 mg Oral Daily Charm Rings, NP   75 mg at 08/15/22 0818   valbenazine (INGREZZA) capsule 80 mg  80 mg Oral Daily Sarina Ill, DO   80 mg at 08/15/22 0818    Lab Results: No results found for this or any previous visit (from the past 48 hour(s)).  Blood Alcohol level:  Lab Results  Component Value Date   ETH <10 08/04/2022   ETH <10 03/22/2021    Metabolic Disorder Labs: Lab Results  Component Value Date   HGBA1C 5.2 08/10/2022   MPG 103 08/10/2022   MPG 91 09/12/2016   Lab Results  Component Value Date   PROLACTIN 37.4 (H) 09/12/2016   Lab Results  Component Value Date   CHOL 142 08/10/2022   TRIG 79 08/10/2022   HDL 28 (L) 08/10/2022   CHOLHDL 5.1 08/10/2022   VLDL 16 08/10/2022   LDLCALC 98 08/10/2022   LDLCALC 73 09/12/2016    Physical Findings: AIMS: Facial and Oral Movements Muscles of Facial Expression: None, normal Lips and Perioral Area: None, normal Jaw: None, normal Tongue: None, normal,Extremity Movements Upper (arms, wrists, hands, fingers): None, normal Lower (legs, knees, ankles, toes): None, normal, Trunk Movements Neck, shoulders, hips: None, normal, Overall Severity Severity of abnormal movements (highest score from questions above): None, normal Incapacitation due to abnormal movements: None, normal Patient's awareness of abnormal movements (rate only patient's report): No Awareness, Dental Status Current problems with teeth and/or dentures?: No Does patient  usually wear dentures?: No  CIWA:    COWS:     Musculoskeletal: Strength & Muscle Tone: within normal limits Gait & Station: normal Patient leans: N/A  Psychiatric Specialty Exam:  Presentation  General Appearance: No data recorded Eye Contact:No data recorded Speech:No data recorded  Speech Volume:No data recorded Handedness:No data recorded  Mood and Affect  Mood:No data recorded Affect:No data recorded  Thought Process  Thought Processes:No data recorded Descriptions of Associations:No data recorded Orientation:No data recorded Thought Content:No data recorded History of Schizophrenia/Schizoaffective disorder:Yes  Duration of Psychotic Symptoms:Greater than six months  Hallucinations:No data recorded Ideas of Reference:No data recorded Suicidal Thoughts:No data recorded Homicidal Thoughts:No data recorded  Sensorium  Memory:No data recorded Judgment:No data recorded Insight:No data recorded  Executive Functions  Concentration:No data recorded Attention Span:No data recorded Recall:No data recorded Fund of Knowledge:No data recorded Language:No data recorded  Psychomotor Activity  Psychomotor Activity:No data recorded  Assets  Assets:No data recorded  Sleep  Sleep:No data recorded   Physical Exam: Physical Exam Vitals reviewed.  Constitutional:      Appearance: Normal appearance.  HENT:     Head: Normocephalic and atraumatic.     Mouth/Throat:     Pharynx: Oropharynx is clear.  Eyes:     Pupils: Pupils are equal, round, and reactive to light.  Cardiovascular:     Rate and Rhythm: Normal rate and regular rhythm.  Pulmonary:     Effort: Pulmonary effort is normal.     Breath sounds: Normal breath sounds.  Abdominal:     General: Abdomen is flat.     Palpations: Abdomen is soft.  Musculoskeletal:        General: Normal range of motion.  Skin:    General: Skin is warm and dry.  Neurological:     General: No focal deficit present.      Mental Status: She is alert. Mental status is at baseline.  Psychiatric:        Attention and Perception: She is inattentive.        Mood and Affect: Mood normal. Affect is inappropriate.        Speech: Speech normal.        Behavior: Behavior is cooperative.        Thought Content: Thought content is delusional.        Cognition and Memory: Memory is impaired.    Review of Systems  Constitutional: Negative.   HENT: Negative.    Eyes: Negative.   Respiratory: Negative.    Cardiovascular: Negative.   Gastrointestinal: Negative.   Musculoskeletal: Negative.   Skin: Negative.   Neurological: Negative.   Psychiatric/Behavioral: Negative.     Blood pressure 101/61, pulse (!) 54, temperature 98.7 F (37.1 C), resp. rate (!) 22, height  (1.575 m), weight 93 kg, SpO2 98 %. Body mass index is 37.49 kg/m.   Treatment Plan Summary: Plan restarted lithium yesterday.  Although this is not exactly an antipsychotic and had previously been important to keeping her bipolar disorder controlled and I am hoping that adding that back to the Abilify we may see some improvement in her strange psychotic thinking.  Encourage group attendance.  Mordecai Rasmussen, MD 08/16/2022, 10:53 AM

## 2022-08-17 DIAGNOSIS — F3112 Bipolar disorder, current episode manic without psychotic features, moderate: Secondary | ICD-10-CM | POA: Diagnosis not present

## 2022-08-17 NOTE — Group Note (Signed)
Date:  08/17/2022 Time:  11:14 PM  Group Topic/Focus:  Wrap-Up Group:   The focus of this group is to help patients review their daily goal of treatment and discuss progress on daily workbooks.    Participation Level:  Minimal  Participation Quality:  Appropriate and Attentive  Affect:  Appropriate  Cognitive:  Alert and Appropriate  Insight: Good  Engagement in Group:  Developing/Improving  Modes of Intervention:  Discussion, Education, Socialization, and Support  Additional Comments:     Magdaleno Lortie 08/17/2022, 11:14 PM

## 2022-08-17 NOTE — Plan of Care (Signed)
  Problem: Education: Goal: Emotional status will improve Outcome: Progressing Goal: Mental status will improve Outcome: Progressing   Problem: Coping: Goal: Ability to verbalize frustrations and anger appropriately will improve Outcome: Progressing   Problem: Safety: Goal: Periods of time without injury will increase Outcome: Progressing   

## 2022-08-17 NOTE — Group Note (Signed)
[facility] LCSW Group Therapy Note   Group Date: 08/17/2022 Start Time: 1310 End Time: 1410  Type of Therapy and Topic:  Group Therapy:  Feelings around Relapse and Recovery  Participation Level:  Minimal    Description of Group:    Patients in this group will discuss emotions they experience before and after a relapse. They will process how experiencing these feelings, or avoidance of experiencing them, relates to having a relapse. Facilitator will guide patients to explore emotions they have related to recovery. Patients will be encouraged to process which emotions are more powerful. They will be guided to discuss the emotional reaction significant others in their lives may have to patients' relapse or recovery. Patients will be assisted in exploring ways to respond to the emotions of others without this contributing to a relapse.  Therapeutic Goals: Patient will identify two or more emotions that lead to relapse for them:  Patient will identify two emotions that result when they relapse:  Patient will identify two emotions related to recovery:  Patient will demonstrate ability to communicate their needs through discussion and/or role plays.   Summary of Patient Progress: Patient was present for the majority of the group process although she spent a lot of time moving to and from the group room throughout. She stated that she has been in recovery for years and that she is just trying to keep on the same path or maintain her recovery. Insight into the topic is questionable. She did appear open and receptive to feedback from both peers and facilitator.    Therapeutic Modalities:   Cognitive Behavioral Therapy Solution-Focused Therapy Assertiveness Training Relapse Prevention Therapy   Glenis Smoker, LCSW

## 2022-08-17 NOTE — Progress Notes (Signed)
[facility] MD Progress Note  08/17/2022 10:23 AM Janice Walls  MRN:  [id number] Subjective: Follow-up 37 year old woman with bipolar disorder with psychosis.  Patient was confirmed by the nurses as having been talking about giving birth to some hairy object yesterday.  Yesterday she remained very disorganized and weird in her speech.  Today I was waking her up in the morning and she denied having said that did not have much more to say still looked a bit confused.  No violent or aggressive behavior however. Principal Problem: Bipolar affective disorder, currently manic, moderate Diagnosis: Principal Problem:   Bipolar affective disorder, currently manic, moderate  Total Time spent with patient: 30 minutes  Past Psychiatric History: Past history of bipolar disorder with psychosis  Past Medical History:  Past Medical History:  Diagnosis Date   Anxiety    Bipolar 1 disorder    Depression    Insomnia    Substance abuse     Past Surgical History:  Procedure Laterality Date   CESAREAN SECTION     CESAREAN SECTION     Family History:  Family History  Problem Relation Age of Onset   CAD Father 31   Family Psychiatric  History: See previous Social History:  Social History   Substance and Sexual Activity  Alcohol Use Not Currently   Comment: rare     Social History   Substance and Sexual Activity  Drug Use Not Currently   Types: Marijuana, Methamphetamines    Social History   Socioeconomic History   Marital status: Single    Spouse name: Not on file   Number of children: Not on file   Years of education: Not on file   Highest education level: Not on file  Occupational History   Occupation: unemployed  Tobacco Use   Smoking status: Every Day    Packs/day: 1.00    Years: 18.00    Additional pack years: 0.00    Total pack years: 18.00    Types: Cigarettes   Smokeless tobacco: Never  Vaping Use   Vaping Use: Never used  Substance and Sexual Activity   Alcohol use: Not  Currently    Comment: rare   Drug use: Not Currently    Types: Marijuana, Methamphetamines   Sexual activity: Yes    Birth control/protection: None  Other Topics Concern   Not on file  Social History Narrative   Not on file   Social Determinants of Health   Financial Resource Strain: Not on file  Food Insecurity: No Food Insecurity (08/05/2022)   Hunger Vital Sign    Worried About Running Out of Food in the Last Year: Never true    Ran Out of Food in the Last Year: Never true  Transportation Needs: No Transportation Needs (08/05/2022)   PRAPARE - Administrator, Civil Service (Medical): No    Lack of Transportation (Non-Medical): No  Physical Activity: Not on file  Stress: Not on file  Social Connections: Not on file   Additional Social History:                         Sleep: Fair  Appetite:  Fair  Current Medications: Current Facility-Administered Medications  Medication Dose Route Frequency Provider Last Rate Last Admin   acetaminophen (TYLENOL) tablet 650 mg  650 mg Oral Q6H PRN Charm Rings, NP       alum & mag hydroxide-simeth (MAALOX/MYLANTA) 200-200-20 MG/5ML suspension 30 mL  30 mL Oral  Q6H PRN Charm Rings, NP   30 mL at 08/08/22 1846   ARIPiprazole (ABILIFY) tablet 20 mg  20 mg Oral Daily Saron Vanorman, Jackquline Denmark, MD   20 mg at 08/17/22 0941   ARIPiprazole ER (ABILIFY MAINTENA) injection 400 mg  400 mg Intramuscular Q28 days Genisis Sonnier T, MD   400 mg at 08/09/22 1726   diphenhydrAMINE (BENADRYL) capsule 50 mg  50 mg Oral BID PRN Charm Rings, NP   50 mg at 08/13/22 2128   Or   diphenhydrAMINE (BENADRYL) injection 50 mg  50 mg Intravenous BID PRN Charm Rings, NP       ibuprofen (ADVIL) tablet 600 mg  600 mg Oral Q8H PRN Charm Rings, NP       lithium carbonate (LITHOBID) ER tablet 300 mg  300 mg Oral Q12H Joffrey Kerce T, MD   300 mg at 08/17/22 0941   magnesium hydroxide (MILK OF MAGNESIA) suspension 30 mL  30 mL Oral Daily PRN Charm Rings, NP       metoprolol succinate (TOPROL-XL) 24 hr tablet 50 mg  50 mg Oral Daily Sarina Ill, DO   50 mg at 08/17/22 0941   nicotine (NICODERM CQ - dosed in mg/24 hours) patch 21 mg  21 mg Transdermal Daily Adair Lemar, Jackquline Denmark, MD   21 mg at 08/17/22 0944   nicotine polacrilex (NICORETTE) gum 2 mg  2 mg Oral PRN Sarina Ill, DO       OLANZapine [facility]) tablet 10 mg  10 mg Oral BID PRN Charm Rings, NP   10 mg at 08/08/22 2117   Or   OLANZapine (ZYPREXA) injection 10 mg  10 mg Intramuscular BID PRN Charm Rings, NP       ondansetron [facility]) tablet 4 mg  4 mg Oral Q8H PRN Charm Rings, NP       topiramate (TOPAMAX) tablet 75 mg  75 mg Oral Daily Charm Rings, NP   75 mg at 08/17/22 0941   valbenazine (INGREZZA) capsule 80 mg  80 mg Oral Daily Sarina Ill, DO   80 mg at 08/17/22 1610    Lab Results: No results found for this or any previous visit (from the past 48 hour(s)).  Blood Alcohol level:  Lab Results  Component Value Date   ETH <10 08/04/2022   ETH <10 03/22/2021    Metabolic Disorder Labs: Lab Results  Component Value Date   HGBA1C 5.2 08/10/2022   MPG 103 08/10/2022   MPG 91 09/12/2016   Lab Results  Component Value Date   PROLACTIN 37.4 (H) 09/12/2016   Lab Results  Component Value Date   CHOL 142 08/10/2022   TRIG 79 08/10/2022   HDL 28 (L) 08/10/2022   CHOLHDL 5.1 08/10/2022   VLDL 16 08/10/2022   LDLCALC 98 08/10/2022   LDLCALC 73 09/12/2016    Physical Findings: AIMS: Facial and Oral Movements Muscles of Facial Expression: None, normal Lips and Perioral Area: None, normal Jaw: None, normal Tongue: None, normal,Extremity Movements Upper (arms, wrists, hands, fingers): None, normal Lower (legs, knees, ankles, toes): None, normal, Trunk Movements Neck, shoulders, hips: None, normal, Overall Severity Severity of abnormal movements (highest score from questions above): None, normal Incapacitation due  to abnormal movements: None, normal Patient's awareness of abnormal movements (rate only patient's report): No Awareness, Dental Status Current problems with teeth and/or dentures?: No Does patient usually wear dentures?: No  CIWA:    COWS:  Musculoskeletal: Strength & Muscle Tone: within normal limits Gait & Station: normal Patient leans: N/A  Psychiatric Specialty Exam:  Presentation  General Appearance: No data recorded Eye Contact:No data recorded Speech:No data recorded Speech Volume:No data recorded Handedness:No data recorded  Mood and Affect  Mood:No data recorded Affect:No data recorded  Thought Process  Thought Processes:No data recorded Descriptions of Associations:No data recorded Orientation:No data recorded Thought Content:No data recorded History of Schizophrenia/Schizoaffective disorder:Yes  Duration of Psychotic Symptoms:Greater than six months  Hallucinations:No data recorded Ideas of Reference:No data recorded Suicidal Thoughts:No data recorded Homicidal Thoughts:No data recorded  Sensorium  Memory:No data recorded Judgment:No data recorded Insight:No data recorded  Executive Functions  Concentration:No data recorded Attention Span:No data recorded Recall:No data recorded Fund of Knowledge:No data recorded Language:No data recorded  Psychomotor Activity  Psychomotor Activity:No data recorded  Assets  Assets:No data recorded  Sleep  Sleep:No data recorded   Physical Exam: Physical Exam Vitals reviewed.  Constitutional:      Appearance: Normal appearance.  HENT:     Head: Normocephalic and atraumatic.     Mouth/Throat:     Pharynx: Oropharynx is clear.  Eyes:     Pupils: Pupils are equal, round, and reactive to light.  Cardiovascular:     Rate and Rhythm: Normal rate and regular rhythm.  Pulmonary:     Effort: Pulmonary effort is normal.     Breath sounds: Normal breath sounds.  Abdominal:     General: Abdomen is flat.      Palpations: Abdomen is soft.  Musculoskeletal:        General: Normal range of motion.  Skin:    General: Skin is warm and dry.  Neurological:     General: No focal deficit present.     Mental Status: She is alert. Mental status is at baseline.  Psychiatric:        Attention and Perception: Attention normal.        Mood and Affect: Mood normal. Affect is blunt.        Speech: Speech is tangential.        Behavior: Behavior normal. Behavior is cooperative.        Thought Content: Thought content is delusional.        Cognition and Memory: Cognition normal. Memory is impaired.    Review of Systems  Constitutional: Negative.   HENT: Negative.    Eyes: Negative.   Respiratory: Negative.    Cardiovascular: Negative.   Gastrointestinal: Negative.   Musculoskeletal: Negative.   Skin: Negative.   Neurological: Negative.   Psychiatric/Behavioral: Negative.     Blood pressure 103/68, pulse (!) 59, temperature 98.1 F (36.7 C), temperature source Oral, resp. rate 17, height  (1.575 m), weight 93 kg, SpO2 98 %. Body mass index is 37.49 kg/m.   Treatment Plan Summary: Medication management and Plan started lithium the other day and she has been agreeable to it I explained the rationale and she has no protest.  No sign of any side effects.  Encourage patient to attend groups today so we get more exposure to her thinking.  Mordecai Rasmussen, MD 08/17/2022, 10:23 AM

## 2022-08-17 NOTE — Progress Notes (Signed)
Pt denies SI/HI/AVH and verbally agrees to approach staff if these become apparent or before harming themselves/others. Rates depression 0/10. Rates anxiety 0/10. Rates pain 0/10. Pt came out of her room one time throughout the night. Scheduled medications administered to pt, per MD orders. RN provided support and encouragement to pt. Q15 min safety checks implemented and continued. Pt is safe on the unit. Plan of care on going and no other concerns expressed at this time.  08/17/22 2015  Psych Admission Type (Psych Patients Only)  Admission Status Involuntary  Psychosocial Assessment  Patient Complaints None  Eye Contact Fair  Facial Expression Animated  Affect Appropriate to circumstance  Speech Logical/coherent  Interaction Assertive  Motor Activity Other (Comment) (WDL)  Appearance/Hygiene Improved  Behavior Characteristics Cooperative;Appropriate to situation;Calm  Mood Euphoric  Aggressive Behavior  Effect No apparent injury  Thought Process  Coherency Disorganized  Content Preoccupation  Delusions None reported or observed  Perception Hallucinations  Hallucination None reported or observed  Judgment Impaired  Confusion None  Danger to Self  Current suicidal ideation? Denies  Danger to Others  Danger to Others None reported or observed

## 2022-08-17 NOTE — Plan of Care (Signed)
  Problem: Education: Goal: Knowledge of [place] [facility] Education information/materials will improve Outcome: Progressing Goal: Emotional status will improve Outcome: Progressing Goal: Mental status will improve Outcome: Progressing   Problem: Activity: Goal: Interest or engagement in activities will improve Outcome: Progressing   

## 2022-08-17 NOTE — BH IP Treatment Plan (Signed)
Interdisciplinary Treatment and Diagnostic Plan Update  08/17/2022 Time of Session: 08:30 Janice Walls MRN: [id number]  Principal Diagnosis: Bipolar affective disorder, currently manic, moderate  Secondary Diagnoses: Principal Problem:   Bipolar affective disorder, currently manic, moderate   Current Medications:  Current Facility-Administered Medications  Medication Dose Route Frequency Provider Last Rate Last Admin   acetaminophen (TYLENOL) tablet 650 mg  650 mg Oral Q6H PRN Charm Rings, NP       alum & mag hydroxide-simeth (MAALOX/MYLANTA) 200-200-20 MG/5ML suspension 30 mL  30 mL Oral Q6H PRN Charm Rings, NP   30 mL at 08/08/22 1846   ARIPiprazole (ABILIFY) tablet 20 mg  20 mg Oral Daily Clapacs, John T, MD   20 mg at 08/16/22 1237   ARIPiprazole ER (ABILIFY MAINTENA) injection 400 mg  400 mg Intramuscular Q28 days Clapacs, John T, MD   400 mg at 08/09/22 1726   diphenhydrAMINE (BENADRYL) capsule 50 mg  50 mg Oral BID PRN Charm Rings, NP   50 mg at 08/13/22 2128   Or   diphenhydrAMINE (BENADRYL) injection 50 mg  50 mg Intravenous BID PRN Charm Rings, NP       ibuprofen (ADVIL) tablet 600 mg  600 mg Oral Q8H PRN Charm Rings, NP       lithium carbonate (LITHOBID) ER tablet 300 mg  300 mg Oral Q12H Clapacs, John T, MD   300 mg at 08/16/22 2042   magnesium hydroxide (MILK OF MAGNESIA) suspension 30 mL  30 mL Oral Daily PRN Charm Rings, NP       metoprolol succinate (TOPROL-XL) 24 hr tablet 50 mg  50 mg Oral Daily Sarina Ill, DO   50 mg at 08/16/22 1237   nicotine (NICODERM CQ - dosed in mg/24 hours) patch 21 mg  21 mg Transdermal Daily Clapacs, John T, MD   21 mg at 08/16/22 1240   nicotine polacrilex (NICORETTE) gum 2 mg  2 mg Oral PRN Sarina Ill, DO       OLANZapine [facility]) tablet 10 mg  10 mg Oral BID PRN Charm Rings, NP   10 mg at 08/08/22 2117   Or   OLANZapine (ZYPREXA) injection 10 mg  10 mg Intramuscular BID PRN Charm Rings, NP       ondansetron [facility]) tablet 4 mg  4 mg Oral Q8H PRN Charm Rings, NP       topiramate (TOPAMAX) tablet 75 mg  75 mg Oral Daily Charm Rings, NP   75 mg at 08/16/22 1237   valbenazine (INGREZZA) capsule 80 mg  80 mg Oral Daily Sarina Ill, DO   80 mg at 08/16/22 1237   PTA Medications: Medications Prior to Admission  Medication Sig Dispense Refill Last Dose   CAPLYTA 42 MG capsule Take 42 mg by mouth daily. (Patient not taking: Reported on 08/04/2022)      HALDOL DECANOATE 100 MG/ML injection Inject 100 mg into the muscle every 30 (thirty) days. (Patient not taking: Reported on 08/04/2022)      naloxone [facility]) nasal spray 4 mg/0.1 mL Place 1 spray into the nose as directed. (Patient not taking: Reported on 08/04/2022)      [DISCONTINUED] INGREZZA 80 MG capsule Take 80 mg by mouth daily. (Patient not taking: Reported on 08/04/2022)      [DISCONTINUED] TOPAMAX 25 MG tablet Take 75 mg by mouth daily. (Patient not taking: Reported on 08/04/2022)  Patient Stressors: Marital or family conflict   Medication change or noncompliance    Patient Strengths: Forensic psychologist fund of knowledge   Treatment Modalities: Medication Management, Group therapy, Case management,  1 to 1 session with clinician, Psychoeducation, Recreational therapy.   Physician Treatment Plan for Primary Diagnosis: Bipolar affective disorder, currently manic, moderate Long Term Goal(s): Improvement in symptoms so as ready for discharge   Short Term Goals: Ability to identify changes in lifestyle to reduce recurrence of condition will improve Ability to verbalize feelings will improve Ability to disclose and discuss suicidal ideas Ability to demonstrate self-control will improve Ability to identify and develop effective coping behaviors will improve Ability to maintain clinical measurements within normal limits will improve Compliance with prescribed medications will  improve Ability to identify triggers associated with substance abuse/mental health issues will improve  Medication Management: Evaluate patient's response, side effects, and tolerance of medication regimen.  Therapeutic Interventions: 1 to 1 sessions, Unit Group sessions and Medication administration.  Evaluation of Outcomes: Progressing  Physician Treatment Plan for Secondary Diagnosis: Principal Problem:   Bipolar affective disorder, currently manic, moderate  Long Term Goal(s): Improvement in symptoms so as ready for discharge   Short Term Goals: Ability to identify changes in lifestyle to reduce recurrence of condition will improve Ability to verbalize feelings will improve Ability to disclose and discuss suicidal ideas Ability to demonstrate self-control will improve Ability to identify and develop effective coping behaviors will improve Ability to maintain clinical measurements within normal limits will improve Compliance with prescribed medications will improve Ability to identify triggers associated with substance abuse/mental health issues will improve     Medication Management: Evaluate patient's response, side effects, and tolerance of medication regimen.  Therapeutic Interventions: 1 to 1 sessions, Unit Group sessions and Medication administration.  Evaluation of Outcomes: Progressing   RN Treatment Plan for Primary Diagnosis: Bipolar affective disorder, currently manic, moderate Long Term Goal(s): Knowledge of disease and therapeutic regimen to maintain health will improve  Short Term Goals: Ability to remain free from injury will improve, Ability to verbalize frustration and anger appropriately will improve, Ability to demonstrate self-control, Ability to participate in decision making will improve, Ability to verbalize feelings will improve, Ability to disclose and discuss suicidal ideas, Ability to identify and develop effective coping behaviors will improve, and  Compliance with prescribed medications will improve  Medication Management: RN will administer medications as ordered by provider, will assess and evaluate patient's response and provide education to patient for prescribed medication. RN will report any adverse and/or side effects to prescribing provider.  Therapeutic Interventions: 1 on 1 counseling sessions, Psychoeducation, Medication administration, Evaluate responses to treatment, Monitor vital signs and CBGs as ordered, Perform/monitor CIWA, COWS, AIMS and Fall Risk screenings as ordered, Perform wound care treatments as ordered.  Evaluation of Outcomes: Progressing   LCSW Treatment Plan for Primary Diagnosis: Bipolar affective disorder, currently manic, moderate Long Term Goal(s): Safe transition to appropriate next level of care at discharge, Engage patient in therapeutic group addressing interpersonal concerns.  Short Term Goals: Engage patient in aftercare planning with referrals and resources, Increase social support, Increase ability to appropriately verbalize feelings, Increase emotional regulation, Facilitate acceptance of mental health diagnosis and concerns, Facilitate patient progression through stages of change regarding substance use diagnoses and concerns, Identify triggers associated with mental health/substance abuse issues, and Increase skills for wellness and recovery  Therapeutic Interventions: Assess for all discharge needs, 1 to 1 time with Social worker, Explore available resources and  support systems, Assess for adequacy in community support network, Educate family and significant other(s) on suicide prevention, Complete Psychosocial Assessment, Interpersonal group therapy.  Evaluation of Outcomes: Progressing   Progress in Treatment: Attending groups: No. Participating in groups: No. Taking medication as prescribed: Yes. Toleration medication: Yes. Family/Significant other contact made: Yes, individual(s)  contacted:  mother/guardian, Odessa Fleming. Patient understands diagnosis: No. Discussing patient identified problems/goals with staff: Yes. Medical problems stabilized or resolved: Yes. Denies suicidal/homicidal ideation: Yes. Issues/concerns per patient self-inventory: No. Other: none.  New problem(s) identified: No, Describe:  none identified Update 08/12/2022: The patient has not identified any no problems. 08/17/22 Update: No changes at this time.   New Short Term/Long Term Goal(s): elimination of symptoms of psychosis, medication management for mood stabilization; elimination of SI thoughts; development of comprehensive mental wellness plan. Update 08/12/2022: the patient has no new goals. 08/17/22 Update: No changes at this time.   Patient Goals:  Patient declined to attend treatment team meeting despite personal invitation by staff nurse. Update 08/12/2022: none 08/17/22 Update: No changes at this time.   Discharge Plan or Barriers: CSW will assist pt with development of an appropriate aftercare/discharge plan. Update 08/12/2022: patient has no new barriers preventing discharge. 08/17/22 Update: No changes at this time.   Reason for Continuation of Hospitalization: Delusions  Depression Hallucinations Medication stabilization   Estimated Length of Stay: 1-7 days Update 08/12/2022: TBD 08/17/22 Update: No changes at this time.  Last 3 [place] Suicide Severity Risk Score: Flowsheet Row Admission (Current) from 08/05/2022 in [facility]PATIENT [facility]INE ED from 08/04/2022 in [facility] Emergency Department at [facility] ED from 12/14/2021 in [facility] Emergency Department at [facility]  C-SSRS RISK CATEGORY No Risk No Risk No Risk       Last PHQ 2/9 Scores:     No data to display          Scribe for Treatment Team: Glenis Smoker, LCSW 08/17/2022 9:08 AM

## 2022-08-17 NOTE — Group Note (Signed)
Recreation Therapy Group Note   Group Topic:Relaxation  Group Date: 08/17/2022 Start Time: 1000 End Time: 1045 Facilitators: Rosina Lowenstein, LRT, CTRS Location:  Craft Room  Group Description: Meditation. LRT asks patients their current level of stress/anxiety from 1-10, with 10 being the highest. LRT educated on the benefits of mindfulness and how it can apply to everyday life post-discharge. LRT and pt's followed along to an audio script of a "guided meditation" video. LRT asked pt their level of stress and anxiety once the prompt was finished. LRT facilitated post-activity processing to gain feedback on session.  Goal Area(s) Addressed:  Patient will practice using relaxation technique. Patient will identify a new coping skill.  Patient will follow multistep directions to reduce anxiety and stress.  Affect/Mood: Anxious   Participation Level: Moderate   Participation Quality: Independent   Behavior: Cooperative   Speech/Thought Process: Coherent   Insight: Fair   Judgement: Fair    Modes of Intervention: Activity   Patient Response to Interventions:  Receptive   Education Outcome:  In group clarification offered    Clinical Observations/Individualized Feedback: Janice Walls was somewhat active in their participation of session activities and group discussion. Pt did not rate her stress and anxiety before or after the session. Pt was observed to be fidgeting, scratching, picking, and moving the whole time throughout group.    Plan: Continue to engage patient in RT group sessions 2-3x/week.   Rosina Lowenstein, LRT, CTRS 08/17/2022 11:12 AM

## 2022-08-17 NOTE — Plan of Care (Signed)
D: Patient alert and oriented. Patient denies pain. Patient denies anxiety and depression. Patient denies SI/HI/AVH. Patient isolative to room with exception to coming out for meals and medication. Patient did come out of room to ask the time and take phone calls.  A: Scheduled medications administered to patient, per MD orders.  Support and encouragement provided to patient.  Q15 minute safety checks maintained.   R: Patient compliant with medication administration and treatment plan. No adverse drug reactions noted. Patient remains safe on the unit at this time. Problem: Education: Goal: Knowledge of [place] [facility] Education information/materials will improve Outcome: Progressing Goal: Verbalization of understanding the information provided will improve Outcome: Progressing   Problem: Health Behavior/Discharge Planning: Goal: Compliance with treatment plan for underlying cause of condition will improve Outcome: Progressing

## 2022-08-18 DIAGNOSIS — F3112 Bipolar disorder, current episode manic without psychotic features, moderate: Secondary | ICD-10-CM | POA: Diagnosis not present

## 2022-08-18 LAB — BASIC METABOLIC PANEL
Anion gap: 5 (ref 5–15)
BUN: 16 mg/dL (ref 6–20)
CO2: 24 mmol/L (ref 22–32)
Calcium: 8.8 mg/dL — ABNORMAL LOW (ref 8.9–10.3)
Chloride: 112 mmol/L — ABNORMAL HIGH (ref 98–111)
Creatinine, Ser: 0.73 mg/dL (ref 0.44–1.00)
GFR, Estimated: >60 mL/min (ref >60)
Glucose, Bld: 92 mg/dL (ref 70–99)
Potassium: 4 mmol/L (ref 3.5–5.1)
Sodium: 141 mmol/L (ref 135–145)

## 2022-08-18 NOTE — Group Note (Signed)
Recreation Therapy Group Note   Group Topic:Leisure Education  Group Date: 08/18/2022 Start Time: 1000 End Time: 1115 Facilitators: Rosina Lowenstein LRT, CTRS Location:  Craft Room  Group Description: Leisure. Patients were given the option to choose from singing karaoke, make origami, playing with a deck of cards, or coloring mandalas. LRT and pts discussed the importance of participating in leisure during their free time and when they're outside of the hospital. Pt identified two leisure interests and shared with the group.   Goal Area(s) Addressed:  Patient will identify a current leisure interest.  Patient will practice making a positive decision. Patient will have the opportunity to try a new leisure activity.  Affect/Mood: N/A   Participation Level: Did not attend    Clinical Observations/Individualized Feedback: Jayley did not attend group due to resting in her room.  Plan: Continue to engage patient in RT group sessions 2-3x/week.   Rosina Lowenstein, LRT, CTRS 08/18/2022 11:44 AM

## 2022-08-18 NOTE — Progress Notes (Signed)
D- Patient alert and oriented x 2-3. Affect blank/mood preoccupied. Denies SI/ HI, depression or anxiety. Endorses AVH and states "will you talk to me in my bubble" "my boyfriend Ron is moving in with me". "I listen to him in my dreams". A- Scheduled medications administered to patient, per MD orders. Support and encouragement provided.  Routine safety checks conducted every 15 minutes without incident. Patient informed to notify staff with problems or concerns and verbalizes understanding R- No adverse drug reactions noted. Patient compliant with medications and treatment plan. Patient receptive, calm, and cooperative. Patient isolative to room except for meals. Patient contracts for safety and remains safe on the unit at this time.

## 2022-08-18 NOTE — Progress Notes (Signed)
[facility] MD Progress Note  08/18/2022 10:52 AM Janice Walls  MRN:  [id number] Subjective: Follow-up for this 37 year old woman with bipolar disorder.  Patient seen and chart reviewed.  She has not been hyperactive today and when asked about her previous delusions will still endorse that yes, she believes that she is a judge but did not spontaneously bring up anything bizarre with me.  Compliant with medicine.  No lithium side effects. Principal Problem: Bipolar affective disorder, currently manic, moderate Diagnosis: Principal Problem:   Bipolar affective disorder, currently manic, moderate  Total Time spent with patient: 30 minutes  Past Psychiatric History: Past history of bipolar disorder with recurrent mania and psychosis and substance abuse  Past Medical History:  Past Medical History:  Diagnosis Date   Anxiety    Bipolar 1 disorder    Depression    Insomnia    Substance abuse     Past Surgical History:  Procedure Laterality Date   CESAREAN SECTION     CESAREAN SECTION     Family History:  Family History  Problem Relation Age of Onset   CAD Father 47   Family Psychiatric  History: See previous Social History:  Social History   Substance and Sexual Activity  Alcohol Use Not Currently   Comment: rare     Social History   Substance and Sexual Activity  Drug Use Not Currently   Types: Marijuana, Methamphetamines    Social History   Socioeconomic History   Marital status: Single    Spouse name: Not on file   Number of children: Not on file   Years of education: Not on file   Highest education level: Not on file  Occupational History   Occupation: unemployed  Tobacco Use   Smoking status: Every Day    Packs/day: 1.00    Years: 18.00    Additional pack years: 0.00    Total pack years: 18.00    Types: Cigarettes   Smokeless tobacco: Never  Vaping Use   Vaping Use: Never used  Substance and Sexual Activity   Alcohol use: Not Currently    Comment: rare    Drug use: Not Currently    Types: Marijuana, Methamphetamines   Sexual activity: Yes    Birth control/protection: None  Other Topics Concern   Not on file  Social History Narrative   Not on file   Social Determinants of Health   Financial Resource Strain: Not on file  Food Insecurity: No Food Insecurity (08/05/2022)   Hunger Vital Sign    Worried About Running Out of Food in the Last Year: Never true    Ran Out of Food in the Last Year: Never true  Transportation Needs: No Transportation Needs (08/05/2022)   PRAPARE - Administrator, Civil Service (Medical): No    Lack of Transportation (Non-Medical): No  Physical Activity: Not on file  Stress: Not on file  Social Connections: Not on file   Additional Social History:                         Sleep: Fair  Appetite:  Fair  Current Medications: Current Facility-Administered Medications  Medication Dose Route Frequency Provider Last Rate Last Admin   acetaminophen (TYLENOL) tablet 650 mg  650 mg Oral Q6H PRN Charm Rings, NP       alum & mag hydroxide-simeth (MAALOX/MYLANTA) 200-200-20 MG/5ML suspension 30 mL  30 mL Oral Q6H PRN Charm Rings, NP  30 mL at 08/08/22 1846   ARIPiprazole (ABILIFY) tablet 20 mg  20 mg Oral Daily Shannah Conteh, Jackquline Denmark, MD   20 mg at 08/17/22 0941   ARIPiprazole ER (ABILIFY MAINTENA) injection 400 mg  400 mg Intramuscular Q28 days Anzlee Hinesley T, MD   400 mg at 08/09/22 1726   diphenhydrAMINE (BENADRYL) capsule 50 mg  50 mg Oral BID PRN Charm Rings, NP   50 mg at 08/13/22 2128   Or   diphenhydrAMINE (BENADRYL) injection 50 mg  50 mg Intravenous BID PRN Charm Rings, NP       ibuprofen (ADVIL) tablet 600 mg  600 mg Oral Q8H PRN Charm Rings, NP       lithium carbonate (LITHOBID) ER tablet 300 mg  300 mg Oral Q12H Ashaun Gaughan T, MD   300 mg at 08/17/22 2035   magnesium hydroxide (MILK OF MAGNESIA) suspension 30 mL  30 mL Oral Daily PRN Charm Rings, NP       metoprolol  succinate (TOPROL-XL) 24 hr tablet 50 mg  50 mg Oral Daily Sarina Ill, DO   50 mg at 08/17/22 0941   nicotine (NICODERM CQ - dosed in mg/24 hours) patch 21 mg  21 mg Transdermal Daily Terrisa Curfman, Jackquline Denmark, MD   21 mg at 08/17/22 0944   nicotine polacrilex (NICORETTE) gum 2 mg  2 mg Oral PRN Sarina Ill, DO       OLANZapine [facility]) tablet 10 mg  10 mg Oral BID PRN Charm Rings, NP   10 mg at 08/08/22 2117   Or   OLANZapine (ZYPREXA) injection 10 mg  10 mg Intramuscular BID PRN Charm Rings, NP       ondansetron [facility]) tablet 4 mg  4 mg Oral Q8H PRN Charm Rings, NP       topiramate (TOPAMAX) tablet 75 mg  75 mg Oral Daily Charm Rings, NP   75 mg at 08/17/22 0941   valbenazine (INGREZZA) capsule 80 mg  80 mg Oral Daily Sarina Ill, DO   80 mg at 08/17/22 1610    Lab Results: No results found for this or any previous visit (from the past 48 hour(s)).  Blood Alcohol level:  Lab Results  Component Value Date   ETH <10 08/04/2022   ETH <10 03/22/2021    Metabolic Disorder Labs: Lab Results  Component Value Date   HGBA1C 5.2 08/10/2022   MPG 103 08/10/2022   MPG 91 09/12/2016   Lab Results  Component Value Date   PROLACTIN 37.4 (H) 09/12/2016   Lab Results  Component Value Date   CHOL 142 08/10/2022   TRIG 79 08/10/2022   HDL 28 (L) 08/10/2022   CHOLHDL 5.1 08/10/2022   VLDL 16 08/10/2022   LDLCALC 98 08/10/2022   LDLCALC 73 09/12/2016    Physical Findings: AIMS: Facial and Oral Movements Muscles of Facial Expression: None, normal Lips and Perioral Area: None, normal Jaw: None, normal Tongue: None, normal,Extremity Movements Upper (arms, wrists, hands, fingers): None, normal Lower (legs, knees, ankles, toes): None, normal, Trunk Movements Neck, shoulders, hips: None, normal, Overall Severity Severity of abnormal movements (highest score from questions above): None, normal Incapacitation due to abnormal movements: None,  normal Patient's awareness of abnormal movements (rate only patient's report): No Awareness, Dental Status Current problems with teeth and/or dentures?: No Does patient usually wear dentures?: No  CIWA:    COWS:     Musculoskeletal: Strength & Muscle  Tone: within normal limits Gait & Station: normal Patient leans: N/A  Psychiatric Specialty Exam:  Presentation  General Appearance: No data recorded Eye Contact:No data recorded Speech:No data recorded Speech Volume:No data recorded Handedness:No data recorded  Mood and Affect  Mood:No data recorded Affect:No data recorded  Thought Process  Thought Processes:No data recorded Descriptions of Associations:No data recorded Orientation:No data recorded Thought Content:No data recorded History of Schizophrenia/Schizoaffective disorder:Yes  Duration of Psychotic Symptoms:Greater than six months  Hallucinations:No data recorded Ideas of Reference:No data recorded Suicidal Thoughts:No data recorded Homicidal Thoughts:No data recorded  Sensorium  Memory:No data recorded Judgment:No data recorded Insight:No data recorded  Executive Functions  Concentration:No data recorded Attention Span:No data recorded Recall:No data recorded Fund of Knowledge:No data recorded Language:No data recorded  Psychomotor Activity  Psychomotor Activity:No data recorded  Assets  Assets:No data recorded  Sleep  Sleep:No data recorded   Physical Exam: Physical Exam Vitals and nursing note reviewed.  Constitutional:      Appearance: Normal appearance.  HENT:     Head: Normocephalic and atraumatic.     Mouth/Throat:     Pharynx: Oropharynx is clear.  Eyes:     Pupils: Pupils are equal, round, and reactive to light.  Cardiovascular:     Rate and Rhythm: Normal rate and regular rhythm.  Pulmonary:     Effort: Pulmonary effort is normal.     Breath sounds: Normal breath sounds.  Abdominal:     General: Abdomen is flat.      Palpations: Abdomen is soft.  Musculoskeletal:        General: Normal range of motion.  Skin:    General: Skin is warm and dry.  Neurological:     General: No focal deficit present.     Mental Status: She is alert. Mental status is at baseline.  Psychiatric:        Attention and Perception: Attention normal.        Mood and Affect: Mood normal.        Speech: Speech normal.        Behavior: Behavior is cooperative.        Thought Content: Thought content normal.        Cognition and Memory: Cognition normal.        Judgment: Judgment normal.    Review of Systems  Constitutional: Negative.   HENT: Negative.    Eyes: Negative.   Respiratory: Negative.    Cardiovascular: Negative.   Gastrointestinal: Negative.   Musculoskeletal: Negative.   Skin: Negative.   Neurological: Negative.   Psychiatric/Behavioral: Negative.     Blood pressure (!) 121/56, pulse (!) 59, temperature 97.9 F (36.6 C), temperature source Oral, resp. rate 18, height 5\' 2"  (1.575 m), weight 93 kg, SpO2 100 %. Body mass index is 37.49 kg/m.   Treatment Plan Summary: Plan no change to medication.  Recheck lithium level on Monday morning.  Continue meds for now.  Behavior has been pretty calm and although she may still hold some psychotic thoughts I think that we can hope for likely discharge no later than next week  Mordecai Rasmussen, MD 08/18/2022, 10:52 AM

## 2022-08-18 NOTE — Group Note (Signed)
Date:  08/18/2022 Time:  11:14 PM  Group Topic/Focus:  Wrap-Up Group:   The focus of this group is to help patients review their daily goal of treatment and discuss progress on daily workbooks.    Participation Level:  Minimal  Participation Quality:  Appropriate and Inattentive  Affect:  Flat  Cognitive:  Alert  Insight: Lacking  Engagement in Group:  Limited and None  Modes of Intervention:  Discussion  Additional Comments:     Maglione,Garvey Westcott E 08/18/2022, 11:14 PM

## 2022-08-19 DIAGNOSIS — F3112 Bipolar disorder, current episode manic without psychotic features, moderate: Secondary | ICD-10-CM | POA: Diagnosis not present

## 2022-08-19 NOTE — Progress Notes (Signed)
Patient was pleasant and cooperative this shift, came out for meds and stated she was ready to get some sleep. No interaction with peers. Isolative to self and room. Did come out for phone calls.  Denies SI, HI, AVH. Encouragement and support provided. Safety checks maintained. Medications given as prescribed. Pt receptive and remains safe on unit with q 15 min checks.

## 2022-08-19 NOTE — Progress Notes (Signed)
[facility] MD Progress Note  08/19/2022 11:17 AM Janice Walls  MRN:  [id number] Subjective: Janice Walls is seen on rounds.  She states that she has no problems.  Nurses report no issues.  She says that she is doing well. Principal Problem: Bipolar affective disorder, currently manic, moderate Diagnosis: Principal Problem:   Bipolar affective disorder, currently manic, moderate  Total Time spent with patient: 15 minutes  Past Psychiatric History: Bipolar disorder  Past Medical History:  Past Medical History:  Diagnosis Date   Anxiety    Bipolar 1 disorder    Depression    Insomnia    Substance abuse     Past Surgical History:  Procedure Laterality Date   CESAREAN SECTION     CESAREAN SECTION     Family History:  Family History  Problem Relation Age of Onset   CAD Father 26   Family Psychiatric  History: Unremarkable Social History:  Social History   Substance and Sexual Activity  Alcohol Use Not Currently   Comment: rare     Social History   Substance and Sexual Activity  Drug Use Not Currently   Types: Marijuana, Methamphetamines    Social History   Socioeconomic History   Marital status: Single    Spouse name: Not on file   Number of children: Not on file   Years of education: Not on file   Highest education level: Not on file  Occupational History   Occupation: unemployed  Tobacco Use   Smoking status: Every Day    Packs/day: 1.00    Years: 18.00    Additional pack years: 0.00    Total pack years: 18.00    Types: Cigarettes   Smokeless tobacco: Never  Vaping Use   Vaping Use: Never used  Substance and Sexual Activity   Alcohol use: Not Currently    Comment: rare   Drug use: Not Currently    Types: Marijuana, Methamphetamines   Sexual activity: Yes    Birth control/protection: None  Other Topics Concern   Not on file  Social History Narrative   Not on file   Social Determinants of Health   Financial Resource Strain: Not on file  Food  Insecurity: No Food Insecurity (08/05/2022)   Hunger Vital Sign    Worried About Running Out of Food in the Last Year: Never true    Ran Out of Food in the Last Year: Never true  Transportation Needs: No Transportation Needs (08/05/2022)   PRAPARE - Administrator, Civil Service (Medical): No    Lack of Transportation (Non-Medical): No  Physical Activity: Not on file  Stress: Not on file  Social Connections: Not on file   Additional Social History:                         Sleep: Good  Appetite:  Good  Current Medications: Current Facility-Administered Medications  Medication Dose Route Frequency Provider Last Rate Last Admin   acetaminophen (TYLENOL) tablet 650 mg  650 mg Oral Q6H PRN Charm Rings, NP       alum & mag hydroxide-simeth (MAALOX/MYLANTA) 200-200-20 MG/5ML suspension 30 mL  30 mL Oral Q6H PRN Charm Rings, NP   30 mL at 08/08/22 1846   ARIPiprazole (ABILIFY) tablet 20 mg  20 mg Oral Daily Clapacs, John T, MD   20 mg at 08/19/22 1052   ARIPiprazole ER (ABILIFY MAINTENA) injection 400 mg  400 mg Intramuscular Q28 days Clapacs, John  T, MD   400 mg at 08/09/22 1726   diphenhydrAMINE (BENADRYL) capsule 50 mg  50 mg Oral BID PRN Charm Rings, NP   50 mg at 08/13/22 2128   Or   diphenhydrAMINE (BENADRYL) injection 50 mg  50 mg Intravenous BID PRN Charm Rings, NP       ibuprofen (ADVIL) tablet 600 mg  600 mg Oral Q8H PRN Charm Rings, NP       lithium carbonate (LITHOBID) ER tablet 300 mg  300 mg Oral Q12H Clapacs, Jackquline Denmark, MD   300 mg at 08/19/22 1052   magnesium hydroxide (MILK OF MAGNESIA) suspension 30 mL  30 mL Oral Daily PRN Charm Rings, NP       metoprolol succinate (TOPROL-XL) 24 hr tablet 50 mg  50 mg Oral Daily Sarina Ill, DO   50 mg at 08/19/22 1052   nicotine (NICODERM CQ - dosed in mg/24 hours) patch 21 mg  21 mg Transdermal Daily Clapacs, Jackquline Denmark, MD   21 mg at 08/19/22 1054   nicotine polacrilex (NICORETTE) gum 2 mg   2 mg Oral PRN Sarina Ill, DO       OLANZapine [facility]) tablet 10 mg  10 mg Oral BID PRN Charm Rings, NP   10 mg at 08/08/22 2117   Or   OLANZapine (ZYPREXA) injection 10 mg  10 mg Intramuscular BID PRN Charm Rings, NP       ondansetron [facility]) tablet 4 mg  4 mg Oral Q8H PRN Charm Rings, NP       topiramate (TOPAMAX) tablet 75 mg  75 mg Oral Daily Charm Rings, NP   75 mg at 08/19/22 1052   valbenazine (INGREZZA) capsule 80 mg  80 mg Oral Daily Sarina Ill, DO   80 mg at 08/19/22 1052    Lab Results:  Results for orders placed or performed during the hospital encounter of 08/05/22 (from the past 48 hour(s))  Basic metabolic panel     Status: Abnormal   Collection Time: 08/18/22 12:50 PM  Result Value Ref Range   Sodium 141 135 - 145 mmol/L   Potassium 4.0 3.5 - 5.1 mmol/L   Chloride 112 (H) 98 - 111 mmol/L   CO2 24 22 - 32 mmol/L   Glucose, Bld 92 70 - 99 mg/dL    Comment: Glucose reference range applies only to samples taken after fasting for at least 8 hours.   BUN 16 6 - 20 mg/dL   Creatinine, Ser 1.61 0.44 - 1.00 mg/dL   Calcium 8.8 (L) 8.9 - 10.3 mg/dL   GFR, Estimated >09 >60 mL/min    Comment: (NOTE) Calculated using the CKD-EPI Creatinine Equation (2021)    Anion gap 5 5 - 15    Comment: Performed at [facility], [address] [place], [place] [address]    Blood Alcohol level:  Lab Results  Component Value Date   [facility] <10 08/04/2022   ETH <10 03/22/2021    Metabolic Disorder Labs: Lab Results  Component Value Date   HGBA1C 5.2 08/10/2022   MPG 103 08/10/2022   MPG 91 09/12/2016   Lab Results  Component Value Date   PROLACTIN 37.4 (H) 09/12/2016   Lab Results  Component Value Date   CHOL 142 08/10/2022   TRIG 79 08/10/2022   HDL 28 (L) 08/10/2022   CHOLHDL 5.1 08/10/2022   VLDL 16 08/10/2022   LDLCALC 98 08/10/2022  LDLCALC 73 09/12/2016    Physical Findings: AIMS: Facial and Oral  Movements Muscles of Facial Expression: None, normal Lips and Perioral Area: None, normal Jaw: None, normal Tongue: None, normal,Extremity Movements Upper (arms, wrists, hands, fingers): None, normal Lower (legs, knees, ankles, toes): None, normal, Trunk Movements Neck, shoulders, hips: None, normal, Overall Severity Severity of abnormal movements (highest score from questions above): None, normal Incapacitation due to abnormal movements: None, normal Patient's awareness of abnormal movements (rate only patient's report): No Awareness, Dental Status Current problems with teeth and/or dentures?: No Does patient usually wear dentures?: No  CIWA:    COWS:     Musculoskeletal: Strength & Muscle Tone: within normal limits Gait & Station: normal Patient leans: N/A  Psychiatric Specialty Exam:  Presentation  General Appearance: No data recorded Eye Contact:No data recorded Speech:No data recorded Speech Volume:No data recorded Handedness:No data recorded  Mood and Affect  Mood:No data recorded Affect:No data recorded  Thought Process  Thought Processes:No data recorded Descriptions of Associations:No data recorded Orientation:No data recorded Thought Content:No data recorded History of Schizophrenia/Schizoaffective disorder:Yes  Duration of Psychotic Symptoms:Greater than six months  Hallucinations:No data recorded Ideas of Reference:No data recorded Suicidal Thoughts:No data recorded Homicidal Thoughts:No data recorded  Sensorium  Memory:No data recorded Judgment:No data recorded Insight:No data recorded  Executive Functions  Concentration:No data recorded Attention Span:No data recorded Recall:No data recorded Fund of Knowledge:No data recorded Language:No data recorded  Psychomotor Activity  Psychomotor Activity:No data recorded  Assets  Assets:No data recorded  Sleep  Sleep:No data recorded    Blood pressure (!) 109/53, pulse (!) 48, temperature 98  F (36.7 C), temperature source Oral, resp. rate 18, height  (1.575 m), weight 93 kg, SpO2 99 %. Body mass index is 37.49 kg/m.   Treatment Plan Summary: Daily contact with patient to assess and evaluate symptoms and progress in treatment, Medication management, and Plan continue current medications.  Sarina Ill, DO 08/19/2022, 11:17 AM

## 2022-08-19 NOTE — Plan of Care (Signed)
?  Problem: Education: ?Goal: Emotional status will improve ?Outcome: Progressing ?  ?Problem: Coping: ?Goal: Ability to verbalize frustrations and anger appropriately will improve ?Outcome: Progressing ?  ?Problem: Safety: ?Goal: Periods of time without injury will increase ?Outcome: Progressing ?  ?

## 2022-08-19 NOTE — [facility] Group Notes (Signed)
[facility] Group Notes:  (Nursing/MHT/Case Management/Adjunct)  Date:  08/19/2022  Time:  2:14 PM  Type of Therapy:  Movement Therapy  Participation Level:  Did Not Attend    Janice Walls 08/19/2022, 2:14 PM

## 2022-08-19 NOTE — Plan of Care (Signed)
D: Patient alert and oriented. Patient denies pain. Patient denies anxiety and depression. Patient denies SI/HI/AVH. Patient isolative to room during shift with exception to coming out for meals and medication.  A: Scheduled medications administered to patient, per MD orders.  Support and encouragement provided to patient.  Q15 minute safety checks maintained.   R: Patient compliant with medication administration and treatment plan. No adverse drug reactions noted. Patient remains safe on the unit at this time.  Problem: Education: Goal: Knowledge of [facility] Education information/materials will improve Outcome: Progressing Goal: Verbalization of understanding the information provided will improve Outcome: Progressing   Problem: Health Behavior/Discharge Planning: Goal: Compliance with treatment plan for underlying cause of condition will improve Outcome: Progressing   Problem: Safety: Goal: Periods of time without injury will increase Outcome: Progressing   

## 2022-08-20 DIAGNOSIS — F3112 Bipolar disorder, current episode manic without psychotic features, moderate: Secondary | ICD-10-CM | POA: Diagnosis not present

## 2022-08-20 NOTE — Progress Notes (Signed)
Pt is calm and cooperative, compliant with medications, denies SI HI AVH.  Pt has no physical complaints and denies pain.  Continued monitoring for safety.    08/20/22 2100  Psych Admission Type (Psych Patients Only)  Admission Status Involuntary  Psychosocial Assessment  Patient Complaints None  Eye Contact Fair  Facial Expression Blank  Affect Appropriate to circumstance  Speech Logical/coherent  Interaction Assertive  Motor Activity Slow  Appearance/Hygiene Unremarkable  Behavior Characteristics Cooperative  Mood Preoccupied  Thought Process  Coherency Disorganized  Content Preoccupation  Delusions None reported or observed  Perception WDL  Hallucination None reported or observed  Judgment Impaired  Confusion Mild  Danger to Self  Current suicidal ideation? Denies  Agreement Not to Harm Self Yes  Description of Agreement verbal  Danger to Others  Danger to Others None reported or observed

## 2022-08-20 NOTE — Plan of Care (Signed)
  Problem: Education: Goal: Knowledge of [facility] Education information/materials will improve Outcome: Progressing Goal: Emotional status will improve Outcome: Progressing Goal: Mental status will improve Outcome: Progressing Goal: Verbalization of understanding the information provided will improve Outcome: Progressing   Problem: Activity: Goal: Interest or engagement in activities will improve Outcome: Progressing Goal: Sleeping patterns will improve Outcome: Progressing   Problem: Coping: Goal: Ability to verbalize frustrations and anger appropriately will improve Outcome: Progressing Goal: Ability to demonstrate self-control will improve Outcome: Progressing   Problem: Health Behavior/Discharge Planning: Goal: Identification of resources available to assist in meeting health care needs will improve Outcome: Progressing Goal: Compliance with treatment plan for underlying cause of condition will improve Outcome: Progressing   Problem: Physical Regulation: Goal: Ability to maintain clinical measurements within normal limits will improve Outcome: Progressing   Problem: Safety: Goal: Periods of time without injury will increase Outcome: Progressing   

## 2022-08-20 NOTE — Progress Notes (Signed)
[facility] MD Progress Note  08/20/2022 11:58 AM Janice Walls  MRN:  [id number] Subjective: Janice Walls says that she is doing fine.  Nurses report no issues.  She seems to be in a good mood.  She denies any side effects from medication.  She has been compliant.  No side effects. Principal Problem: Bipolar affective disorder, currently manic, moderate Diagnosis: Principal Problem:   Bipolar affective disorder, currently manic, moderate  Total Time spent with patient: 15 minutes  Past Psychiatric History: Bipolar disorder  Past Medical History:  Past Medical History:  Diagnosis Date   Anxiety    Bipolar 1 disorder    Depression    Insomnia    Substance abuse     Past Surgical History:  Procedure Laterality Date   CESAREAN SECTION     CESAREAN SECTION     Family History:  Family History  Problem Relation Age of Onset   CAD Father 29   Family Psychiatric  History: Unremarkable Social History:  Social History   Substance and Sexual Activity  Alcohol Use Not Currently   Comment: rare     Social History   Substance and Sexual Activity  Drug Use Not Currently   Types: Marijuana, Methamphetamines    Social History   Socioeconomic History   Marital status: Single    Spouse name: Not on file   Number of children: Not on file   Years of education: Not on file   Highest education level: Not on file  Occupational History   Occupation: unemployed  Tobacco Use   Smoking status: Every Day    Packs/day: 1.00    Years: 18.00    Additional pack years: 0.00    Total pack years: 18.00    Types: Cigarettes   Smokeless tobacco: Never  Vaping Use   Vaping Use: Never used  Substance and Sexual Activity   Alcohol use: Not Currently    Comment: rare   Drug use: Not Currently    Types: Marijuana, Methamphetamines   Sexual activity: Yes    Birth control/protection: None  Other Topics Concern   Not on file  Social History Narrative   Not on file   Social Determinants of  Health   Financial Resource Strain: Not on file  Food Insecurity: No Food Insecurity (08/05/2022)   Hunger Vital Sign    Worried About Running Out of Food in the Last Year: Never true    Ran Out of Food in the Last Year: Never true  Transportation Needs: No Transportation Needs (08/05/2022)   PRAPARE - Administrator, Civil Service (Medical): No    Lack of Transportation (Non-Medical): No  Physical Activity: Not on file  Stress: Not on file  Social Connections: Not on file   Additional Social History:                         Sleep: Good  Appetite:  Good  Current Medications: Current Facility-Administered Medications  Medication Dose Route Frequency Provider Last Rate Last Admin   acetaminophen (TYLENOL) tablet 650 mg  650 mg Oral Q6H PRN Charm Rings, NP       alum & mag hydroxide-simeth (MAALOX/MYLANTA) 200-200-20 MG/5ML suspension 30 mL  30 mL Oral Q6H PRN Charm Rings, NP   30 mL at 08/08/22 1846   ARIPiprazole (ABILIFY) tablet 20 mg  20 mg Oral Daily Clapacs, Jackquline Denmark, MD   20 mg at 08/20/22 0934   ARIPiprazole ER (ABILIFY  MAINTENA) injection 400 mg  400 mg Intramuscular Q28 days Clapacs, Jackquline Denmark, MD   400 mg at 08/09/22 1726   diphenhydrAMINE (BENADRYL) capsule 50 mg  50 mg Oral BID PRN Charm Rings, NP   50 mg at 08/13/22 2128   Or   diphenhydrAMINE (BENADRYL) injection 50 mg  50 mg Intravenous BID PRN Charm Rings, NP       ibuprofen (ADVIL) tablet 600 mg  600 mg Oral Q8H PRN Charm Rings, NP       lithium carbonate (LITHOBID) ER tablet 300 mg  300 mg Oral Q12H Clapacs, Jackquline Denmark, MD   300 mg at 08/20/22 0934   magnesium hydroxide (MILK OF MAGNESIA) suspension 30 mL  30 mL Oral Daily PRN Charm Rings, NP       metoprolol succinate (TOPROL-XL) 24 hr tablet 50 mg  50 mg Oral Daily Sarina Ill, DO   50 mg at 08/20/22 0935   nicotine (NICODERM CQ - dosed in mg/24 hours) patch 21 mg  21 mg Transdermal Daily Clapacs, Jackquline Denmark, MD   21 mg at  08/20/22 0941   nicotine polacrilex (NICORETTE) gum 2 mg  2 mg Oral PRN Sarina Ill, DO       OLANZapine [facility]) tablet 10 mg  10 mg Oral BID PRN Charm Rings, NP   10 mg at 08/08/22 2117   Or   OLANZapine (ZYPREXA) injection 10 mg  10 mg Intramuscular BID PRN Charm Rings, NP       ondansetron [facility]) tablet 4 mg  4 mg Oral Q8H PRN Charm Rings, NP       topiramate (TOPAMAX) tablet 75 mg  75 mg Oral Daily Charm Rings, NP   75 mg at 08/20/22 0935   valbenazine (INGREZZA) capsule 80 mg  80 mg Oral Daily Sarina Ill, DO   80 mg at 08/20/22 0935    Lab Results:  Results for orders placed or performed during the hospital encounter of 08/05/22 (from the past 48 hour(s))  Basic metabolic panel     Status: Abnormal   Collection Time: 08/18/22 12:50 PM  Result Value Ref Range   Sodium 141 135 - 145 mmol/L   Potassium 4.0 3.5 - 5.1 mmol/L   Chloride 112 (H) 98 - 111 mmol/L   CO2 24 22 - 32 mmol/L   Glucose, Bld 92 70 - 99 mg/dL    Comment: Glucose reference range applies only to samples taken after fasting for at least 8 hours.   BUN 16 6 - 20 mg/dL   Creatinine, Ser 1.61 0.44 - 1.00 mg/dL   Calcium 8.8 (L) 8.9 - 10.3 mg/dL   GFR, Estimated >09 >60 mL/min    Comment: (NOTE) Calculated using the CKD-EPI Creatinine Equation (2021)    Anion gap 5 5 - 15    Comment: Performed at [facility], [address] [place], [place] [address]    Blood Alcohol level:  Lab Results  Component Value Date   [facility] <10 08/04/2022   ETH <10 03/22/2021    Metabolic Disorder Labs: Lab Results  Component Value Date   HGBA1C 5.2 08/10/2022   MPG 103 08/10/2022   MPG 91 09/12/2016   Lab Results  Component Value Date   PROLACTIN 37.4 (H) 09/12/2016   Lab Results  Component Value Date   CHOL 142 08/10/2022   TRIG 79 08/10/2022   HDL 28 (L) 08/10/2022   CHOLHDL 5.1 08/10/2022  VLDL 16 08/10/2022   LDLCALC 98 08/10/2022   LDLCALC 73 09/12/2016     Physical Findings: AIMS: Facial and Oral Movements Muscles of Facial Expression: None, normal Lips and Perioral Area: None, normal Jaw: None, normal Tongue: None, normal,Extremity Movements Upper (arms, wrists, hands, fingers): None, normal Lower (legs, knees, ankles, toes): None, normal, Trunk Movements Neck, shoulders, hips: None, normal, Overall Severity Severity of abnormal movements (highest score from questions above): None, normal Incapacitation due to abnormal movements: None, normal Patient's awareness of abnormal movements (rate only patient's report): No Awareness, Dental Status Current problems with teeth and/or dentures?: No Does patient usually wear dentures?: No  CIWA:    COWS:     Musculoskeletal: Strength & Muscle Tone: within normal limits Gait & Station: normal Patient leans: N/A  Psychiatric Specialty Exam:  Presentation  General Appearance: No data recorded Eye Contact:No data recorded Speech:No data recorded Speech Volume:No data recorded Handedness:No data recorded  Mood and Affect  Mood:No data recorded Affect:No data recorded  Thought Process  Thought Processes:No data recorded Descriptions of Associations:No data recorded Orientation:No data recorded Thought Content:No data recorded History of Schizophrenia/Schizoaffective disorder:Yes  Duration of Psychotic Symptoms:Greater than six months  Hallucinations:No data recorded Ideas of Reference:No data recorded Suicidal Thoughts:No data recorded Homicidal Thoughts:No data recorded  Sensorium  Memory:No data recorded Judgment:No data recorded Insight:No data recorded  Executive Functions  Concentration:No data recorded Attention Span:No data recorded Recall:No data recorded Fund of Knowledge:No data recorded Language:No data recorded  Psychomotor Activity  Psychomotor Activity:No data recorded  Assets  Assets:No data recorded  Sleep  Sleep:No data recorded   Blood  pressure (!) 142/128, pulse 68, temperature 97.9 F (36.6 C), temperature source Oral, resp. rate 18, height  (1.575 m), weight 93 kg, SpO2 99 %. Body mass index is 37.49 kg/m.   Treatment Plan Summary: Daily contact with patient to assess and evaluate symptoms and progress in treatment, Medication management, and Plan continue current medications.  Sarina Ill, DO 08/20/2022, 11:58 AM

## 2022-08-20 NOTE — Progress Notes (Signed)
D- Patient alert and oriented x 2-3. Affect blank/ mood preoccupied. She denies SI/HI but endorses  AVH "while I am dreaming" She denies pain. She refused her personal self inventory assessment this morning. A- Scheduled medications administered to patient, per MD orders. Support and encouragement provided.  Routine safety checks conducted every 15 minutes without incident. Patient informed to notify staff with problems or concerns and verbalizes understanding. R- No adverse drug reactions noted. Patient compliant with medications and treatment plan. Patient receptive, calm, and cooperative. She is isolative to her room except for meals. She contracts for safety and remains safe on the unit this time.

## 2022-08-20 NOTE — Group Note (Signed)
LCSW Group Therapy Note   Group Date: 08/20/2022 Start Time: 1309 End Time: 1349   Type of Therapy and Topic:  Group Therapy: Problem Solving   Participation Level:  Did Not Attend    Summary of Patient Progress:  The patient did not attend group.    Marshell Levan, LCSWA 08/20/2022  2:10 PM

## 2022-08-21 ENCOUNTER — Other Ambulatory Visit: Payer: Self-pay

## 2022-08-21 DIAGNOSIS — F3112 Bipolar disorder, current episode manic without psychotic features, moderate: Secondary | ICD-10-CM | POA: Diagnosis not present

## 2022-08-21 LAB — LITHIUM LEVEL: Lithium Lvl: 0.44 mmol/L — ABNORMAL LOW (ref 0.60–1.20)

## 2022-08-21 MED ORDER — LITHIUM CARBONATE ER 300 MG PO TBCR: 300.0000 mg | EXTENDED_RELEASE_TABLET | Freq: Two times a day (BID) | ORAL | 1 refills | Status: DC

## 2022-08-21 MED ORDER — ARIPIPRAZOLE 20 MG PO TABS: 20.0000 mg | ORAL_TABLET | Freq: Every day | ORAL | 1 refills | Status: DC

## 2022-08-21 MED ORDER — ARIPIPRAZOLE 20 MG PO TABS
20.0000 mg | ORAL_TABLET | Freq: Every day | ORAL | 0 refills | Status: DC
  Filled 2022-08-21: qty 10, 10d supply, fill #0

## 2022-08-21 MED ORDER — LITHIUM CARBONATE ER 300 MG PO TBCR
300.0000 mg | EXTENDED_RELEASE_TABLET | Freq: Two times a day (BID) | ORAL | 0 refills | Status: DC
  Filled 2022-08-21: qty 20, 10d supply, fill #0

## 2022-08-21 NOTE — Group Note (Signed)
Date:  08/21/2022 Time:  10:12 AM  Group Topic/Focus:  Goals Group:   The focus of this group is to help patients establish daily goals to achieve during treatment and discuss how the patient can incorporate goal setting into their daily lives to aide in recovery.  Community Meeting    Participation Level:  Did Not Attend   Malgorzata Albert Travis Mozell Haber 08/21/2022, 10:12 AM  

## 2022-08-21 NOTE — Plan of Care (Signed)
  Problem: Education: Goal: Knowledge of [facility] Education information/materials will improve Outcome: Progressing Goal: Emotional status will improve Outcome: Progressing Goal: Mental status will improve Outcome: Progressing   Problem: Activity: Goal: Interest or engagement in activities will improve Outcome: Progressing Goal: Sleeping patterns will improve Outcome: Progressing   

## 2022-08-21 NOTE — Group Note (Signed)
Date:  08/21/2022 Time:  4:37 PM  Group Topic/Focus:  Activity Group    Participation Level:  Did Not Attend   Lynelle Smoke [facility] 08/21/2022, 4:37 PM

## 2022-08-21 NOTE — Group Note (Signed)
[facility] LCSW Group Therapy Note    Group Date: 08/21/2022 Start Time: 1315 End Time: 1409  Type of Therapy and Topic:  Group Therapy:  Overcoming Obstacles  Participation Level:  [facility] PARTICIPATION LEVEL: Did Not Attend   Description of Group:   In this group patients will be encouraged to explore what they see as obstacles to their own wellness and recovery. They will be guided to discuss their thoughts, feelings, and behaviors related to these obstacles. The group will process together ways to cope with barriers, with attention given to specific choices patients can make. Each patient will be challenged to identify changes they are motivated to make in order to overcome their obstacles. This group will be process-oriented, with patients participating in exploration of their own experiences as well as giving and receiving support and challenge from other group members.  Therapeutic Goals: 1. Patient will identify personal and current obstacles as they relate to admission. 2. Patient will identify barriers that currently interfere with their wellness or overcoming obstacles.  3. Patient will identify feelings, thought process and behaviors related to these barriers. 4. Patient will identify two changes they are willing to make to overcome these obstacles:    Summary of Patient Progress X   Therapeutic Modalities:   Cognitive Behavioral Therapy Solution Focused Therapy Motivational Interviewing Relapse Prevention Therapy   Audre Cenci R Venus Gilles, LCSW 

## 2022-08-21 NOTE — Progress Notes (Signed)
Pt denies SI/HI/AVH and verbally agrees to approach staff if these become apparent or before harming themselves/others. Rates depression 0/10. Rates anxiety 0/10. Rates pain 0/10. Pt stated to RN that she "been around bad people a long time that do drugs and stuff like that but now I am around good people so I wanted to see if there was any meds to help me stop comparing relationships." Pt also stated that she had a great boyfriend now that supports her. Scheduled medications administered to pt, per MD orders. RN provided support and encouragement to pt. Q15 min safety checks implemented and continued. Pt is safe on the unit. Plan of care on going and no other concerns expressed at this time.  08/21/22 0810  Psych Admission Type (Psych Patients Only)  Admission Status Involuntary  Psychosocial Assessment  Patient Complaints None  Eye Contact Fair  Facial Expression Animated  Affect Appropriate to circumstance  Speech Logical/coherent  Interaction Assertive  Motor Activity Slow  Appearance/Hygiene Unremarkable  Behavior Characteristics Cooperative;Calm;Appropriate to situation  Mood Preoccupied  Aggressive Behavior  Effect No apparent injury  Thought Process  Coherency Disorganized  Content Preoccupation  Delusions None reported or observed  Perception WDL  Hallucination None reported or observed  Judgment Impaired  Confusion None  Danger to Self  Current suicidal ideation? Denies  Danger to Others  Danger to Others None reported or observed

## 2022-08-21 NOTE — Progress Notes (Signed)
[facility] MD Progress Note  08/21/2022 3:59 PM Janice Walls  MRN:  [id number] Subjective: Patient seen.  No complaints.  Denies hallucinations.  Reviewed notes.  Not going to most groups but when there is information provided she seems to be stabilizing.  No mention of any recent bizarre behavior. Principal Problem: Bipolar affective disorder, currently manic, moderate Diagnosis: Principal Problem:   Bipolar affective disorder, currently manic, moderate  Total Time spent with patient: 30 minutes  Past Psychiatric History: Past history of bipolar disorder substance abuse  Past Medical History:  Past Medical History:  Diagnosis Date   Anxiety    Bipolar 1 disorder    Depression    Insomnia    Substance abuse     Past Surgical History:  Procedure Laterality Date   CESAREAN SECTION     CESAREAN SECTION     Family History:  Family History  Problem Relation Age of Onset   CAD Father 51   Family Psychiatric  History: See previous Social History:  Social History   Substance and Sexual Activity  Alcohol Use Not Currently   Comment: rare     Social History   Substance and Sexual Activity  Drug Use Not Currently   Types: Marijuana, Methamphetamines    Social History   Socioeconomic History   Marital status: Single    Spouse name: Not on file   Number of children: Not on file   Years of education: Not on file   Highest education level: Not on file  Occupational History   Occupation: unemployed  Tobacco Use   Smoking status: Every Day    Packs/day: 1.00    Years: 18.00    Additional pack years: 0.00    Total pack years: 18.00    Types: Cigarettes   Smokeless tobacco: Never  Vaping Use   Vaping Use: Never used  Substance and Sexual Activity   Alcohol use: Not Currently    Comment: rare   Drug use: Not Currently    Types: Marijuana, Methamphetamines   Sexual activity: Yes    Birth control/protection: None  Other Topics Concern   Not on file  Social History  Narrative   Not on file   Social Determinants of Health   Financial Resource Strain: Not on file  Food Insecurity: No Food Insecurity (08/05/2022)   Hunger Vital Sign    Worried About Running Out of Food in the Last Year: Never true    Ran Out of Food in the Last Year: Never true  Transportation Needs: No Transportation Needs (08/05/2022)   PRAPARE - Administrator, Civil Service (Medical): No    Lack of Transportation (Non-Medical): No  Physical Activity: Not on file  Stress: Not on file  Social Connections: Not on file   Additional Social History:                         Sleep: Fair  Appetite:  Fair  Current Medications: Current Facility-Administered Medications  Medication Dose Route Frequency Provider Last Rate Last Admin   acetaminophen (TYLENOL) tablet 650 mg  650 mg Oral Q6H PRN Charm Rings, NP       alum & mag hydroxide-simeth (MAALOX/MYLANTA) 200-200-20 MG/5ML suspension 30 mL  30 mL Oral Q6H PRN Charm Rings, NP   30 mL at 08/08/22 1846   ARIPiprazole (ABILIFY) tablet 20 mg  20 mg Oral Daily Jamyria Ozanich, Jackquline Denmark, MD   20 mg at 08/21/22 [phone]  ARIPiprazole ER (ABILIFY MAINTENA) injection 400 mg  400 mg Intramuscular Q28 days Jimya Ciani, Jackquline Denmark, MD   400 mg at 08/09/22 1726   diphenhydrAMINE (BENADRYL) capsule 50 mg  50 mg Oral BID PRN Charm Rings, NP   50 mg at 08/13/22 2128   Or   diphenhydrAMINE (BENADRYL) injection 50 mg  50 mg Intravenous BID PRN Charm Rings, NP       ibuprofen (ADVIL) tablet 600 mg  600 mg Oral Q8H PRN Charm Rings, NP       lithium carbonate (LITHOBID) ER tablet 300 mg  300 mg Oral Q12H Nocole Zammit, Jackquline Denmark, MD   300 mg at 08/21/22 0810   magnesium hydroxide (MILK OF MAGNESIA) suspension 30 mL  30 mL Oral Daily PRN Charm Rings, NP       metoprolol succinate (TOPROL-XL) 24 hr tablet 50 mg  50 mg Oral Daily Sarina Ill, DO   50 mg at 08/20/22 0935   nicotine (NICODERM CQ - dosed in mg/24 hours) patch 21 mg  21  mg Transdermal Daily Kamau Weatherall, Jackquline Denmark, MD   21 mg at 08/21/22 4540   nicotine polacrilex (NICORETTE) gum 2 mg  2 mg Oral PRN Sarina Ill, DO       OLANZapine [facility]) tablet 10 mg  10 mg Oral BID PRN Charm Rings, NP   10 mg at 08/21/22 1208   Or   OLANZapine (ZYPREXA) injection 10 mg  10 mg Intramuscular BID PRN Charm Rings, NP       ondansetron [facility]) tablet 4 mg  4 mg Oral Q8H PRN Charm Rings, NP       topiramate (TOPAMAX) tablet 75 mg  75 mg Oral Daily Charm Rings, NP   75 mg at 08/21/22 0810   valbenazine (INGREZZA) capsule 80 mg  80 mg Oral Daily Sarina Ill, DO   80 mg at 08/21/22 9811    Lab Results:  Results for orders placed or performed during the hospital encounter of 08/05/22 (from the past 48 hour(s))  Lithium level     Status: Abnormal   Collection Time: 08/21/22  7:05 AM  Result Value Ref Range   Lithium Lvl 0.44 (L) 0.60 - 1.20 mmol/L    Comment: Performed at [facility], [address] [place], [place] [address]    Blood Alcohol level:  Lab Results  Component Value Date   [facility] <10 08/04/2022   ETH <10 03/22/2021    Metabolic Disorder Labs: Lab Results  Component Value Date   HGBA1C 5.2 08/10/2022   MPG 103 08/10/2022   MPG 91 09/12/2016   Lab Results  Component Value Date   PROLACTIN 37.4 (H) 09/12/2016   Lab Results  Component Value Date   CHOL 142 08/10/2022   TRIG 79 08/10/2022   HDL 28 (L) 08/10/2022   CHOLHDL 5.1 08/10/2022   VLDL 16 08/10/2022   LDLCALC 98 08/10/2022   LDLCALC 73 09/12/2016    Physical Findings: AIMS: Facial and Oral Movements Muscles of Facial Expression: None, normal Lips and Perioral Area: None, normal Jaw: None, normal Tongue: None, normal,Extremity Movements Upper (arms, wrists, hands, fingers): None, normal Lower (legs, knees, ankles, toes): None, normal, Trunk Movements Neck, shoulders, hips: None, normal, Overall Severity Severity of abnormal movements (highest  score from questions above): None, normal Incapacitation due to abnormal movements: None, normal Patient's awareness of abnormal movements (rate only patient's report): No Awareness, Dental Status Current problems with  teeth and/or dentures?: No Does patient usually wear dentures?: No  CIWA:    COWS:     Musculoskeletal: Strength & Muscle Tone: within normal limits Gait & Station: normal Patient leans: N/A  Psychiatric Specialty Exam:  Presentation  General Appearance: No data recorded Eye Contact:No data recorded Speech:No data recorded Speech Volume:No data recorded Handedness:No data recorded  Mood and Affect  Mood:No data recorded Affect:No data recorded  Thought Process  Thought Processes:No data recorded Descriptions of Associations:No data recorded Orientation:No data recorded Thought Content:No data recorded History of Schizophrenia/Schizoaffective disorder:Yes  Duration of Psychotic Symptoms:Greater than six months  Hallucinations:No data recorded Ideas of Reference:No data recorded Suicidal Thoughts:No data recorded Homicidal Thoughts:No data recorded  Sensorium  Memory:No data recorded Judgment:No data recorded Insight:No data recorded  Executive Functions  Concentration:No data recorded Attention Span:No data recorded Recall:No data recorded Fund of Knowledge:No data recorded Language:No data recorded  Psychomotor Activity  Psychomotor Activity:No data recorded  Assets  Assets:No data recorded  Sleep  Sleep:No data recorded   Physical Exam: Physical Exam Vitals and nursing note reviewed.  Constitutional:      Appearance: Normal appearance.  HENT:     Head: Normocephalic and atraumatic.     Mouth/Throat:     Pharynx: Oropharynx is clear.  Eyes:     Pupils: Pupils are equal, round, and reactive to light.  Cardiovascular:     Rate and Rhythm: Normal rate and regular rhythm.  Pulmonary:     Effort: Pulmonary effort is normal.      Breath sounds: Normal breath sounds.  Abdominal:     General: Abdomen is flat.     Palpations: Abdomen is soft.  Musculoskeletal:        General: Normal range of motion.  Skin:    General: Skin is warm and dry.  Neurological:     General: No focal deficit present.     Mental Status: She is alert. Mental status is at baseline.  Psychiatric:        Attention and Perception: Attention normal.        Mood and Affect: Mood normal. Affect is blunt.        Speech: Speech normal.        Behavior: Behavior is cooperative.        Thought Content: Thought content normal.        Cognition and Memory: Cognition normal.    Review of Systems  Constitutional: Negative.   HENT: Negative.    Eyes: Negative.   Respiratory: Negative.    Cardiovascular: Negative.   Gastrointestinal: Negative.   Musculoskeletal: Negative.   Skin: Negative.   Neurological: Negative.   Psychiatric/Behavioral: Negative.     Blood pressure 101/60, pulse (!) 57, temperature 97.8 F (36.6 C), temperature source Oral, resp. rate 19, height  (1.575 m), weight 93 kg, SpO2 100 %. Body mass index is 37.49 kg/m.   Treatment Plan Summary: Medication management and Plan patient seems to be stabilizing.  May still have some underlying psychosis but is certainly not been acting out dangerously.  I think we can readdress the possibility of discharge tomorrow.  Mordecai Rasmussen, MD 08/21/2022, 3:59 PM

## 2022-08-21 NOTE — Group Note (Signed)
Date:  08/21/2022 Time:  11:36 PM  Group Topic/Focus:  Wrap-Up Group:   The focus of this group is to help patients review their daily goal of treatment and discuss progress on daily workbooks.    Participation Level:  Minimal  Participation Quality:  Appropriate  Affect:  Flat  Cognitive:  Alert and Confused  Insight: Good  Engagement in Group:  Lacking  Modes of Intervention:  Activity  Additional Comments:     Maglione,Jessey Huyett E 08/21/2022, 11:36 PM

## 2022-08-21 NOTE — Group Note (Signed)
Recreation Therapy Group Note   Group Topic:Goal Setting  Group Date: 08/21/2022 Start Time: 1015 End Time: 1130 Facilitators: Clinton Gallant, CTRS Location: Craft Room  Group Description: Vision Board. Patients were given many different magazines, a glue stick, markers, and a piece of cardstock paper. LRT and pts discussed the importance of having goals in life. LRT and pts discussed the difference between short-term and long-term goals, as well as what a SMART goal is. LRT encouraged pts to create a vision board, with images they picked and then cut out by LRT from the magazine, for themselves, that capture their short and long-term goals. On the back of the paper, pt encouraged to write 3 different coping skills that can help them reach those goals. LRT encouraged pts to show and explain their vision board to the group. LRT offered to laminate vision board once dry and complete.   Goal Area(s) Addressed:  Patient will gain knowledge of short vs. long term goals.  Patient will identify goals for themselves. Patient will practice setting SMART goals.  Affect/Mood: N/A   Participation Level: Did not attend    Clinical Observations/Individualized Feedback: Janice Walls did not attend group due to resting in her room.  Plan: Continue to engage patient in RT group sessions 2-3x/week.   Janice Walls, LRT, CTRS 08/21/2022 12:36 PM

## 2022-08-22 DIAGNOSIS — F3112 Bipolar disorder, current episode manic without psychotic features, moderate: Secondary | ICD-10-CM | POA: Diagnosis not present

## 2022-08-22 NOTE — BHH Counselor (Signed)
CSW attempted contact with mother/guardian, Odessa Fleming [phone]) to notify of discharge and coordinate aftercare. Contact was unable to be established and HIPAA compliant voicemail left with contact information for follow through.   Vilma Meckel. Algis Greenhouse, MSW, LCSW, LCAS 08/22/2022 9:37 AM

## 2022-08-22 NOTE — Progress Notes (Signed)
Discharge note: Suicide safety plan and survey complete. RN met with pt and reviewed pt's discharge instructions. Pt verbalized understanding of discharge instructions and pt did not have any questions. RN reviewed and provided pt with a copy of SRA, AVS and Transition Record. RN returned pt's belongings to pt. Prescriptions and samples were given to pt. Pt denied SI/HI/AVH and voiced no concerns. Pt was appreciative of the care pt received at [facility]. Patient discharged to the lobby without incident.  08/22/22 0805  Psych Admission Type (Psych Patients Only)  Admission Status Involuntary  Psychosocial Assessment  Patient Complaints Worrying  Eye Contact Fair  Facial Expression Animated;Anxious  Affect Appropriate to circumstance  Speech Tangential  Interaction Assertive  Motor Activity Other (Comment) (WDL)  Appearance/Hygiene Unremarkable  Behavior Characteristics Cooperative;Appropriate to situation;Calm  Mood Preoccupied  Aggressive Behavior  Effect No apparent injury  Thought Process  Coherency Disorganized  Content Preoccupation;Delusions;Paranoia  Delusions Paranoid  Perception Hallucinations  Hallucination Auditory  Judgment Limited  Confusion Mild  Danger to Self  Current suicidal ideation? Denies  Danger to Others  Danger to Others None reported or observed

## 2022-08-22 NOTE — BHH Suicide Risk Assessment (Signed)
[facility] Discharge Suicide Risk Assessment   Principal Problem: Bipolar affective disorder, currently manic, moderate Discharge Diagnoses: Principal Problem:   Bipolar affective disorder, currently manic, moderate   Total Time spent with patient: 30 minutes  Musculoskeletal: Strength & Muscle Tone: within normal limits Gait & Station: normal Patient leans: N/A  Psychiatric Specialty Exam  Presentation  General Appearance: No data recorded Eye Contact:No data recorded Speech:No data recorded Speech Volume:No data recorded Handedness:No data recorded  Mood and Affect  Mood:No data recorded Duration of Depression Symptoms: Greater than two weeks  Affect:No data recorded  Thought Process  Thought Processes:No data recorded Descriptions of Associations:No data recorded Orientation:No data recorded Thought Content:No data recorded History of Schizophrenia/Schizoaffective disorder:Yes  Duration of Psychotic Symptoms:Greater than six months  Hallucinations:No data recorded Ideas of Reference:No data recorded Suicidal Thoughts:No data recorded Homicidal Thoughts:No data recorded  Sensorium  Memory:No data recorded Judgment:No data recorded Insight:No data recorded  Executive Functions  Concentration:No data recorded Attention Span:No data recorded Recall:No data recorded Fund of Knowledge:No data recorded Language:No data recorded  Psychomotor Activity  Psychomotor Activity:No data recorded  Assets  Assets:No data recorded  Sleep  Sleep:No data recorded  Physical Exam: Physical Exam Vitals and nursing note reviewed.  Constitutional:      Appearance: Normal appearance.  HENT:     Head: Normocephalic and atraumatic.     Mouth/Throat:     Pharynx: Oropharynx is clear.  Eyes:     Pupils: Pupils are equal, round, and reactive to light.  Cardiovascular:     Rate and Rhythm: Normal rate and regular rhythm.  Pulmonary:     Effort: Pulmonary effort is normal.      Breath sounds: Normal breath sounds.  Abdominal:     General: Abdomen is flat.     Palpations: Abdomen is soft.  Musculoskeletal:        General: Normal range of motion.  Skin:    General: Skin is warm and dry.  Neurological:     General: No focal deficit present.     Mental Status: She is alert. Mental status is at baseline.  Psychiatric:        Attention and Perception: Attention normal.        Mood and Affect: Mood normal.        Speech: Speech normal.        Behavior: Behavior normal.        Thought Content: Thought content is delusional.        Cognition and Memory: Cognition normal.        Judgment: Judgment normal.    Review of Systems  Constitutional: Negative.   HENT: Negative.    Eyes: Negative.   Respiratory: Negative.    Cardiovascular: Negative.   Gastrointestinal: Negative.   Musculoskeletal: Negative.   Skin: Negative.   Neurological: Negative.   Psychiatric/Behavioral: Negative.     Blood pressure 115/73, pulse 85, temperature 98 F (36.7 C), temperature source Oral, resp. rate (!) 21, height  (1.575 m), weight 93 kg, SpO2 98 %. Body mass index is 37.49 kg/m.  Mental Status Per Nursing Assessment::   On Admission:  NA  Demographic Factors:  Caucasian and Unemployed  Loss Factors: Financial problems/change in socioeconomic status  Historical Factors: Impulsivity  Risk Reduction Factors:   Positive therapeutic relationship  Continued Clinical Symptoms:  Bipolar Disorder:   Mixed State  Cognitive Features That Contribute To Risk:  None    Suicide Risk:  Minimal: No identifiable suicidal ideation.  Patients presenting with no risk factors but with morbid ruminations; may be classified as minimal risk based on the severity of the depressive symptoms    Plan Of Care/Follow-up recommendations:  Other:  Patient is referred back to outpatient follow-up.  Continue medication.  She has not displayed any dangerous behavior in the hospital  and is denying suicidal or homicidal ideation.  Occasionally she will still express delusions but they appear to be benign and not affecting her ability to do day-to-day self-care.  Does not appear to still meet commitment criteria.  She agrees to outpatient follow-up and will be given prescriptions and a supply of medicine at discharge  Mordecai Rasmussen, MD 08/22/2022, 10:04 AM

## 2022-08-22 NOTE — BH IP Treatment Plan (Signed)
Interdisciplinary Treatment and Diagnostic Plan Update  08/22/2022 Time of Session: 08:30 Lively Haberman MRN: [id number]  Principal Diagnosis: Bipolar affective disorder, currently manic, moderate  Secondary Diagnoses: Principal Problem:   Bipolar affective disorder, currently manic, moderate   Current Medications:  Current Facility-Administered Medications  Medication Dose Route Frequency Provider Last Rate Last Admin   acetaminophen (TYLENOL) tablet 650 mg  650 mg Oral Q6H PRN Charm Rings, NP       alum & mag hydroxide-simeth (MAALOX/MYLANTA) 200-200-20 MG/5ML suspension 30 mL  30 mL Oral Q6H PRN Charm Rings, NP   30 mL at 08/08/22 1846   ARIPiprazole (ABILIFY) tablet 20 mg  20 mg Oral Daily Clapacs, John T, MD   20 mg at 08/22/22 0805   ARIPiprazole ER (ABILIFY MAINTENA) injection 400 mg  400 mg Intramuscular Q28 days Clapacs, John T, MD   400 mg at 08/09/22 1726   diphenhydrAMINE (BENADRYL) capsule 50 mg  50 mg Oral BID PRN Charm Rings, NP   50 mg at 08/13/22 2128   Or   diphenhydrAMINE (BENADRYL) injection 50 mg  50 mg Intravenous BID PRN Charm Rings, NP       ibuprofen (ADVIL) tablet 600 mg  600 mg Oral Q8H PRN Charm Rings, NP       lithium carbonate (LITHOBID) ER tablet 300 mg  300 mg Oral Q12H Clapacs, John T, MD   300 mg at 08/22/22 0805   magnesium hydroxide (MILK OF MAGNESIA) suspension 30 mL  30 mL Oral Daily PRN Charm Rings, NP       metoprolol succinate (TOPROL-XL) 24 hr tablet 50 mg  50 mg Oral Daily Sarina Ill, DO   50 mg at 08/22/22 6812   nicotine (NICODERM CQ - dosed in mg/24 hours) patch 21 mg  21 mg Transdermal Daily Clapacs, John T, MD   21 mg at 08/22/22 0810   nicotine polacrilex (NICORETTE) gum 2 mg  2 mg Oral PRN Sarina Ill, DO       OLANZapine [facility]) tablet 10 mg  10 mg Oral BID PRN Charm Rings, NP   10 mg at 08/21/22 1208   Or   OLANZapine (ZYPREXA) injection 10 mg  10 mg Intramuscular BID PRN Charm Rings, NP       ondansetron [facility]) tablet 4 mg  4 mg Oral Q8H PRN Charm Rings, NP       topiramate (TOPAMAX) tablet 75 mg  75 mg Oral Daily Charm Rings, NP   75 mg at 08/22/22 0805   valbenazine (INGREZZA) capsule 80 mg  80 mg Oral Daily Sarina Ill, DO   80 mg at 08/22/22 0805   PTA Medications: Medications Prior to Admission  Medication Sig Dispense Refill Last Dose   CAPLYTA 42 MG capsule Take 42 mg by mouth daily. (Patient not taking: Reported on 08/04/2022)      HALDOL DECANOATE 100 MG/ML injection Inject 100 mg into the muscle every 30 (thirty) days. (Patient not taking: Reported on 08/04/2022)      naloxone [facility]) nasal spray 4 mg/0.1 mL Place 1 spray into the nose as directed. (Patient not taking: Reported on 08/04/2022)      [DISCONTINUED] INGREZZA 80 MG capsule Take 80 mg by mouth daily. (Patient not taking: Reported on 08/04/2022)      [DISCONTINUED] TOPAMAX 25 MG tablet Take 75 mg by mouth daily. (Patient not taking: Reported on 08/04/2022)  Patient Stressors: Marital or family conflict   Medication change or noncompliance    Patient Strengths: Forensic psychologist fund of knowledge   Treatment Modalities: Medication Management, Group therapy, Case management,  1 to 1 session with clinician, Psychoeducation, Recreational therapy.   Physician Treatment Plan for Primary Diagnosis: Bipolar affective disorder, currently manic, moderate Long Term Goal(s): Improvement in symptoms so as ready for discharge   Short Term Goals: Ability to identify changes in lifestyle to reduce recurrence of condition will improve Ability to verbalize feelings will improve Ability to disclose and discuss suicidal ideas Ability to demonstrate self-control will improve Ability to identify and develop effective coping behaviors will improve Ability to maintain clinical measurements within normal limits will improve Compliance with prescribed medications will  improve Ability to identify triggers associated with substance abuse/mental health issues will improve  Medication Management: Evaluate patient's response, side effects, and tolerance of medication regimen.  Therapeutic Interventions: 1 to 1 sessions, Unit Group sessions and Medication administration.  Evaluation of Outcomes: Adequate for Discharge  Physician Treatment Plan for Secondary Diagnosis: Principal Problem:   Bipolar affective disorder, currently manic, moderate  Long Term Goal(s): Improvement in symptoms so as ready for discharge   Short Term Goals: Ability to identify changes in lifestyle to reduce recurrence of condition will improve Ability to verbalize feelings will improve Ability to disclose and discuss suicidal ideas Ability to demonstrate self-control will improve Ability to identify and develop effective coping behaviors will improve Ability to maintain clinical measurements within normal limits will improve Compliance with prescribed medications will improve Ability to identify triggers associated with substance abuse/mental health issues will improve     Medication Management: Evaluate patient's response, side effects, and tolerance of medication regimen.  Therapeutic Interventions: 1 to 1 sessions, Unit Group sessions and Medication administration.  Evaluation of Outcomes: Adequate for Discharge   RN Treatment Plan for Primary Diagnosis: Bipolar affective disorder, currently manic, moderate Long Term Goal(s): Knowledge of disease and therapeutic regimen to maintain health will improve  Short Term Goals: Ability to remain free from injury will improve, Ability to verbalize frustration and anger appropriately will improve, Ability to demonstrate self-control, Ability to participate in decision making will improve, Ability to verbalize feelings will improve, Ability to disclose and discuss suicidal ideas, Ability to identify and develop effective coping behaviors  will improve, and Compliance with prescribed medications will improve  Medication Management: RN will administer medications as ordered by provider, will assess and evaluate patient's response and provide education to patient for prescribed medication. RN will report any adverse and/or side effects to prescribing provider.  Therapeutic Interventions: 1 on 1 counseling sessions, Psychoeducation, Medication administration, Evaluate responses to treatment, Monitor vital signs and CBGs as ordered, Perform/monitor CIWA, COWS, AIMS and Fall Risk screenings as ordered, Perform wound care treatments as ordered.  Evaluation of Outcomes: Adequate for Discharge   LCSW Treatment Plan for Primary Diagnosis: Bipolar affective disorder, currently manic, moderate Long Term Goal(s): Safe transition to appropriate next level of care at discharge, Engage patient in therapeutic group addressing interpersonal concerns.  Short Term Goals: Engage patient in aftercare planning with referrals and resources, Increase social support, Increase ability to appropriately verbalize feelings, Increase emotional regulation, Facilitate acceptance of mental health diagnosis and concerns, Facilitate patient progression through stages of change regarding substance use diagnoses and concerns, Identify triggers associated with mental health/substance abuse issues, and Increase skills for wellness and recovery  Therapeutic Interventions: Assess for all discharge needs, 1 to 1 time with  Social worker, Explore available resources and support systems, Assess for adequacy in community support network, Educate family and significant other(s) on suicide prevention, Complete Psychosocial Assessment, Interpersonal group therapy.  Evaluation of Outcomes: Adequate for Discharge   Progress in Treatment: Attending groups: No. Participating in groups: No. Taking medication as prescribed: Yes. Toleration medication: Yes. Family/Significant other  contact made: Yes, individual(s) contacted:  mother/guardian, Odessa Fleming. Patient understands diagnosis: No. Discussing patient identified problems/goals with staff: Yes. Medical problems stabilized or resolved: Yes. Denies suicidal/homicidal ideation: Yes. Issues/concerns per patient self-inventory: No. Other: none.  New problem(s) identified: No, Describe:  none identified Update 08/12/2022: The patient has not identified any no problems. 08/17/22 Update: No changes at this time. 08/22/22 Update: No changes at this time.   New Short Term/Long Term Goal(s): elimination of symptoms of psychosis, medication management for mood stabilization; elimination of SI thoughts; development of comprehensive mental wellness plan. Update 08/12/2022: the patient has no new goals. 08/17/22 Update: No changes at this time. 08/22/22 Update: No changes at this time.   Patient Goals:  Patient declined to attend treatment team meeting despite personal invitation by staff nurse. Update 08/12/2022: none 08/17/22 Update: No changes at this time. 08/22/22 Update: No changes at this time.   Discharge Plan or Barriers: CSW will assist pt with development of an appropriate aftercare/discharge plan. Update 08/12/2022: patient has no new barriers preventing discharge. 08/17/22 Update: No changes at this time. 08/22/22 Update: Pt to discharge back home with continued outpatient services.    Reason for Continuation of Hospitalization: Delusions  Depression Hallucinations Medication stabilization   Estimated Length of Stay: 1-7 days Update 08/12/2022: TBD 08/17/22 Update: No changes at this time. 08/22/22 Update: No changes at this time.  Last 3 [place] Suicide Severity Risk Score: Flowsheet Row Admission (Current) from 08/05/2022 in [facility]ICINE ED from 08/04/2022 in [facility] Emergency Department at [facility] ED from 12/14/2021 in [facility] Emergency Department at [facility]  C-SSRS RISK  CATEGORY No Risk No Risk No Risk       Last PHQ 2/9 Scores:     No data to display          Scribe for Treatment Team: Glenis Smoker, LCSW 08/22/2022 9:05 AM

## 2022-08-22 NOTE — Progress Notes (Signed)
Patient is pleasant, but still very delusional.  Came to the nurses station asked  to speak with security. Presented a composition book that she asked security to read.  Stated that it would explain everything, but the book contained letters to her boyfriend, daughter and other family members.  All of the letters referenced her loving and missing them while being away hospitalized.   She took her scheduled medication without incident. She denies having any psychotic symptoms.  She is isolative to her room except for snack and meals.    C Butler-Nicholson, LPN

## 2022-08-22 NOTE — Plan of Care (Signed)
  Problem: Education: Goal: Knowledge of [place] [facility] Education information/materials will improve Outcome: Progressing Goal: Emotional status will improve Outcome: Progressing Goal: Mental status will improve Outcome: Progressing   Problem: Activity: Goal: Interest or engagement in activities will improve Outcome: Progressing   

## 2022-08-22 NOTE — Group Note (Signed)
Date:  08/22/2022 Time:  10:10 AM  Group Topic/Focus:  Goals Group:   The focus of this group is to help patients establish daily goals to achieve during treatment and discuss how the patient can incorporate goal setting into their daily lives to aide in recovery.  Community Meeting   Participation Level:  Did Not Attend   Lynelle Smoke [facility] 08/22/2022, 10:10 AM

## 2022-08-22 NOTE — Plan of Care (Signed)
  Problem: Education: Goal: Emotional status will improve Outcome: Progressing Goal: Mental status will improve Outcome: Progressing   Problem: Coping: Goal: Ability to verbalize frustrations and anger appropriately will improve Outcome: Progressing Goal: Ability to demonstrate self-control will improve Outcome: Progressing   

## 2022-08-22 NOTE — Progress Notes (Signed)
  [facility] Adult Case Management Discharge Plan :  Will you be returning to the same living situation after discharge:  Yes,  pt plans to return home upon discharge. At discharge, do you have transportation home?: Yes,  pt father and boyfriend to provide transportation home. Do you have the ability to pay for your medications: Yes,  [employer].  Release of information consent forms completed and in the chart;  Patient's signature needed at discharge.  Patient to Follow up at:  Follow-up Information     [facility]   Why: Your appointment is scheduled for Wednesday, 08/30/22 at 11AM. There will be an email sent to the email on file in regards to the patient portal which is where the initial paperwork will be found that needs to be completed prior to appointment. Please bring ID, insurance, and medications to your first appointment. Thanks! Contact information: [address] [place] [address] [phone]                 Next level of care provider has access to [facility] Link:no  Safety Planning and Suicide Prevention discussed: Yes,  SPE completed with guardian/mother, Odessa Fleming.     Has patient been referred to the Quitline?: Patient refused referral  Patient has been referred for addiction treatment: Pt. refused referral  Glenis Smoker, LCSW 08/22/2022, 1:36 PM
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
## 2022-08-22 NOTE — Group Note (Signed)
Recreation Therapy Group Note   Group Topic:Healthy Support Systems  Group Date: 08/22/2022 Start Time: 1000 End Time: 1110 Facilitators: Rosina Lowenstein, LRT, CTRS Location:  Craft Room  Group Description: Straw Bridge. Individually, patients were given 10 plastic drinking straws and an equal length of masking tape. Using the materials provided, patients were instructed to build a free-standing bridge-like structure to suspend an everyday item (ex: puzzle box) off the floor or table surface. All materials were required to be used in Secondary school teacher. LRT facilitated post-activity discussion reviewing how we, humans, are like the structure we built; when things get too heavy in our life and we do not have adequate supports/coping skills, then we will fall just like the straw-built structure will. LRT focused on how having a "base" or structure on the bottom was necessary for the object to stand, meaning we must be secure and stable first before building on ourselves or others. Patients were encouraged to name 2 healthy supports in their life and reflect on how the skills used in this activity can be generalized to daily life post discharge.  Goal Area(s) Addressed:  Patient will identify two healthy support systems in their life. Patient will work on Product manager. Patient will verbalize the importance of having a strong and steady "base".  Patient will follow multi-step directions. Patients will engage in creativity and use all provided materials.  Affect/Mood: N/A   Participation Level: Did not attend    Clinical Observations/Individualized Feedback: Davey came to group for about 15 minutes before walking out and not returning. During her time in the craft room, she was reading and seemed uninterested.   Plan: Continue to engage patient in RT group sessions 2-3x/week.   Rosina Lowenstein, LRT, CTRS 08/22/2022 12:06 PM

## 2022-08-22 NOTE — BHH Counselor (Signed)
CSW spoke with mother/guardian, Janice Walls [phone]) to notify of discharge and coordinate aftercare. Janice Walls was updated that pt was ready for discharge today. CSW inquired regarding who pt would be following up with. Foster informed CSW that pt has been with the [facility] services of [facility] but they had discharged her from their care back in November. Mother shared that she had made an appointment for the pt at [facility] for tomorrow, 08/23/22 but had cancelled it because she was not certain that pt would be discharged. She asked that CSW contact [facility] to schedule follow up appointment. CSW agreed. Mother asked for a call with pt appointment. She shared that she is currently on a cruise, if Clinical research associate got the voicemail, and a message could be left with appointment information. CSW agreed. No other concerns expressed. Contact ended without incident.   CSW attempted to contact guardian/mother, Janice Walls regarding aftercare appointment. Contact unable to be established but voicemail left with appointment details.   Janice Walls. Janice Walls, MSW, LCSW, LCAS 08/22/2022 1:33 PM

## 2022-08-24 ENCOUNTER — Other Ambulatory Visit: Payer: Self-pay

## 2022-08-25 ENCOUNTER — Other Ambulatory Visit: Payer: Self-pay

## 2022-08-27 ENCOUNTER — Other Ambulatory Visit: Payer: Self-pay

## 2022-08-28 ENCOUNTER — Other Ambulatory Visit: Payer: Self-pay

## 2022-08-30 ENCOUNTER — Other Ambulatory Visit: Payer: Self-pay

## 2022-08-31 ENCOUNTER — Other Ambulatory Visit: Payer: Self-pay

## 2022-09-01 ENCOUNTER — Other Ambulatory Visit: Payer: Self-pay

## 2022-09-08 ENCOUNTER — Other Ambulatory Visit: Payer: Self-pay

## 2023-01-29 ENCOUNTER — Emergency Department
Admission: EM | Admit: 2023-01-29 | Discharge: 2023-01-30 | Disposition: A | Discharge status: Other Acute Inpatient Hospital | Payer: MEDICAID | Attending: Emergency Medicine | Admitting: Emergency Medicine

## 2023-01-29 ENCOUNTER — Other Ambulatory Visit: Payer: Self-pay

## 2023-01-29 DIAGNOSIS — F23 Brief psychotic disorder: Secondary | ICD-10-CM

## 2023-01-29 DIAGNOSIS — Y900 Blood alcohol level of less than 20 mg/100 ml: Secondary | ICD-10-CM | POA: Diagnosis not present

## 2023-01-29 DIAGNOSIS — F17210 Nicotine dependence, cigarettes, uncomplicated: Secondary | ICD-10-CM | POA: Diagnosis not present

## 2023-01-29 DIAGNOSIS — F29 Unspecified psychosis not due to a substance or known physiological condition: Secondary | ICD-10-CM | POA: Insufficient documentation

## 2023-01-29 DIAGNOSIS — F312 Bipolar disorder, current episode manic severe with psychotic features: Secondary | ICD-10-CM | POA: Diagnosis not present

## 2023-01-29 DIAGNOSIS — F3110 Bipolar disorder, current episode manic without psychotic features, unspecified: Secondary | ICD-10-CM | POA: Diagnosis present

## 2023-01-29 LAB — ACETAMINOPHEN LEVEL: Acetaminophen (Tylenol), Serum: <10 ug/mL — ABNORMAL LOW (ref 10–30)

## 2023-01-29 LAB — COMPREHENSIVE METABOLIC PANEL
ALT: 40 U/L (ref 0–44)
AST: 29 U/L (ref 15–41)
Albumin: 3.7 g/dL (ref 3.5–5.0)
Alkaline Phosphatase: 75 U/L (ref 38–126)
Anion gap: 9 (ref 5–15)
BUN: 17 mg/dL (ref 6–20)
CO2: 21 mmol/L — ABNORMAL LOW (ref 22–32)
Calcium: 8.5 mg/dL — ABNORMAL LOW (ref 8.9–10.3)
Chloride: 111 mmol/L (ref 98–111)
Creatinine, Ser: 0.68 mg/dL (ref 0.44–1.00)
GFR, Estimated: >60 mL/min (ref >60)
Glucose, Bld: 112 mg/dL — ABNORMAL HIGH (ref 70–99)
Potassium: 4.1 mmol/L (ref 3.5–5.1)
Sodium: 141 mmol/L (ref 135–145)
Total Bilirubin: 0.4 mg/dL (ref 0.3–1.2)
Total Protein: 7.5 g/dL (ref 6.5–8.1)

## 2023-01-29 LAB — URINE DRUG SCREEN, QUALITATIVE (ARMC ONLY)
Amphetamines, Ur Screen: NOT DETECTED
Barbiturates, Ur Screen: NOT DETECTED
Benzodiazepine, Ur Scrn: NOT DETECTED
Cannabinoid 50 Ng, Ur ~~LOC~~: NOT DETECTED
Cocaine Metabolite,Ur ~~LOC~~: NOT DETECTED
MDMA (Ecstasy)Ur Screen: NOT DETECTED
Methadone Scn, Ur: NOT DETECTED
Opiate, Ur Screen: NOT DETECTED
Phencyclidine (PCP) Ur S: NOT DETECTED
Tricyclic, Ur Screen: NOT DETECTED

## 2023-01-29 LAB — CBC
HCT: 44.4 % (ref 36.0–46.0)
Hemoglobin: 14.6 g/dL (ref 12.0–15.0)
MCH: 28.8 pg (ref 26.0–34.0)
MCHC: 32.9 g/dL (ref 30.0–36.0)
MCV: 87.6 fL (ref 80.0–100.0)
Platelets: 285 10*3/uL (ref 150–400)
RBC: 5.07 MIL/uL (ref 3.87–5.11)
RDW: 14 % (ref 11.5–15.5)
WBC: 13.1 10*3/uL — ABNORMAL HIGH (ref 4.0–10.5)
nRBC: 0 % (ref 0.0–0.2)

## 2023-01-29 LAB — SALICYLATE LEVEL: Salicylate Lvl: <7 mg/dL — ABNORMAL LOW (ref 7.0–30.0)

## 2023-01-29 LAB — ETHANOL: Alcohol, Ethyl (B): <10 mg/dL (ref <10)
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
## 2023-01-29 NOTE — ED Notes (Signed)
Pt given PM snack.  
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
## 2023-01-29 NOTE — ED Notes (Signed)
Pt. To BHU from ED ambulatory without difficulty, to room  BHU 3. Report from [facility]. Pt. Is alert and oriented, warm and dry in no distress. Pt. Denies SI, HI, and AVH. Pt. Calm and cooperative. Pt. Made aware of security cameras and Q15 minute rounds. Pt. Encouraged to let Nursing staff know of any concerns or needs.   ENVIRONMENTAL ASSESSMENT Potentially harmful objects out of patient reach: Yes.   Personal belongings secured: Yes.   Patient dressed in hospital provided attire only: Yes.   Plastic bags out of patient reach: Yes.   Patient care equipment (cords, cables, call bells, lines, and drains) shortened, removed, or accounted for: Yes.   Equipment and supplies removed from bottom of stretcher: Yes.   Potentially toxic materials out of patient reach: Yes.   Sharps container removed or out of patient reach: Yes.

## 2023-01-29 NOTE — ED Notes (Signed)
IVC PENDING  CONSULT ?

## 2023-01-29 NOTE — ED Notes (Signed)
IVC/ Pending consult/ moved to [facility]

## 2023-01-30 ENCOUNTER — Other Ambulatory Visit: Payer: Self-pay

## 2023-01-30 ENCOUNTER — Inpatient Hospital Stay
Admission: AD | Admit: 2023-01-30 | Discharge: 2023-02-13 | DRG: 885 | Disposition: A | Discharge status: Home / Self Care | Payer: MEDICAID | Source: Intra-hospital | Attending: Psychiatry | Admitting: Psychiatry

## 2023-01-30 ENCOUNTER — Encounter: Payer: Self-pay | Admitting: Psychiatric/Mental Health

## 2023-01-30 DIAGNOSIS — F17210 Nicotine dependence, cigarettes, uncomplicated: Secondary | ICD-10-CM | POA: Diagnosis present

## 2023-01-30 DIAGNOSIS — F419 Anxiety disorder, unspecified: Secondary | ICD-10-CM | POA: Diagnosis present

## 2023-01-30 DIAGNOSIS — Z8249 Family history of ischemic heart disease and other diseases of the circulatory system: Secondary | ICD-10-CM | POA: Diagnosis not present

## 2023-01-30 DIAGNOSIS — Z560 Unemployment, unspecified: Secondary | ICD-10-CM | POA: Diagnosis not present

## 2023-01-30 DIAGNOSIS — F312 Bipolar disorder, current episode manic severe with psychotic features: Secondary | ICD-10-CM | POA: Diagnosis present

## 2023-01-30 DIAGNOSIS — F319 Bipolar disorder, unspecified: Principal | ICD-10-CM | POA: Diagnosis present

## 2023-01-30 DIAGNOSIS — Z79899 Other long term (current) drug therapy: Secondary | ICD-10-CM | POA: Diagnosis not present

## 2023-01-30 MED ORDER — MAGNESIUM HYDROXIDE 400 MG/5ML PO SUSP: 30.0000 mL | Freq: Every day | ORAL | Status: DC | PRN

## 2023-01-30 MED ORDER — ALUM & MAG HYDROXIDE-SIMETH 200-200-20 MG/5ML PO SUSP: 30.0000 mL | ORAL | Status: DC | PRN

## 2023-01-30 MED ORDER — NICOTINE 14 MG/24HR TD PT24
14.0000 mg | MEDICATED_PATCH | Freq: Every day | TRANSDERMAL | Status: DC
  Administered 2023-02-01 – 2023-02-02 (×2): 14 mg via TRANSDERMAL
  Filled 2023-01-30 (×13): qty 1

## 2023-01-30 MED ORDER — LORAZEPAM 2 MG/ML IJ SOLN: 2.0000 mg | Freq: Three times a day (TID) | INTRAMUSCULAR | Status: DC | PRN

## 2023-01-30 MED ORDER — TRAZODONE HCL 50 MG PO TABS
50.0000 mg | ORAL_TABLET | Freq: Every evening | ORAL | Status: DC | PRN
  Administered 2023-01-31 – 2023-02-11 (×6): 50 mg via ORAL
  Filled 2023-01-30 (×7): qty 1

## 2023-01-30 MED ORDER — HALOPERIDOL LACTATE 5 MG/ML IJ SOLN: 5.0000 mg | Freq: Three times a day (TID) | INTRAMUSCULAR | Status: DC | PRN

## 2023-01-30 MED ORDER — ACETAMINOPHEN 325 MG PO TABS: 650.0000 mg | ORAL_TABLET | Freq: Four times a day (QID) | ORAL | Status: DC | PRN

## 2023-01-30 MED ORDER — DIPHENHYDRAMINE HCL 50 MG/ML IJ SOLN: 50.0000 mg | Freq: Three times a day (TID) | INTRAMUSCULAR | Status: DC | PRN

## 2023-01-30 MED ORDER — DIPHENHYDRAMINE HCL 25 MG PO CAPS: 50.0000 mg | ORAL_CAPSULE | Freq: Three times a day (TID) | ORAL | Status: DC | PRN

## 2023-01-30 MED ORDER — INFLUENZA VIRUS VACC SPLIT PF (FLUZONE) 0.5 ML IM SUSY: 0.5000 mL | PREFILLED_SYRINGE | INTRAMUSCULAR | Status: DC

## 2023-01-30 MED ORDER — HALOPERIDOL 5 MG PO TABS: 5.0000 mg | ORAL_TABLET | Freq: Three times a day (TID) | ORAL | Status: DC | PRN

## 2023-01-30 MED ORDER — HYDROXYZINE HCL 25 MG PO TABS
25.0000 mg | ORAL_TABLET | Freq: Three times a day (TID) | ORAL | Status: DC | PRN
  Administered 2023-02-07: 25 mg via ORAL
  Filled 2023-01-30 (×3): qty 1

## 2023-01-30 MED ORDER — LORAZEPAM 2 MG PO TABS: 2.0000 mg | ORAL_TABLET | Freq: Three times a day (TID) | ORAL | Status: DC | PRN

## 2023-01-30 NOTE — Group Note (Signed)
Date:  01/30/2023 Time:  10:24 AM  Group Topic/Focus:  Healthy Communication:   The focus of this group is to discuss communication, barriers to communication, as well as healthy ways to communicate with others.    Participation Level:  Did Not Attend   Janice Walls 01/30/2023, 10:24 AM

## 2023-01-30 NOTE — ED Notes (Addendum)
Patient mother/guardian called checking on patient. Mother states she is going to bring legal guardianship documents to be filed on patient's chart. Mother is wanting long term care for patient.

## 2023-01-30 NOTE — Tx Team (Signed)
Initial Treatment Plan 01/30/2023 12:19 PM [name] YNW:[id number]    PATIENT STRESSORS: Medication change or noncompliance   Traumatic event     PATIENT STRENGTHS: Communication skills  Physical Health  Supportive family/friends    PATIENT IDENTIFIED PROBLEMS: Non compliant with medicine  Poor insight                    DISCHARGE CRITERIA:  Adequate post-discharge living arrangements Medical problems require only outpatient monitoring Verbal commitment to aftercare and medication compliance  PRELIMINARY DISCHARGE PLAN: Attend aftercare/continuing care group Return to previous living arrangement  PATIENT/FAMILY INVOLVEMENT: This treatment plan has been presented to and reviewed with the patient, Janice Walls, and/or family member,  The patient and family have been given the opportunity to ask questions and make suggestions.  Leonarda Salon, RN 01/30/2023, 12:19 PM

## 2023-01-30 NOTE — Group Note (Signed)
Date:  01/30/2023 Time:  4:47 PM  Group Topic/Focus:  Making Healthy Choices:   The focus of this group is to help patients identify negative/unhealthy choices they were using prior to admission and identify positive/healthier coping strategies to replace them upon discharge.    Participation Level:  Active  Participation Quality:  Appropriate  Affect:  Appropriate  Cognitive:  Alert and Appropriate  Insight: Appropriate  Engagement in Group:  Developing/Improving and Engaged  Modes of Intervention:  Activity, Discussion, Education, and Socialization  Additional Comments:    Rosaura Carpenter 01/30/2023, 4:47 PM

## 2023-01-30 NOTE — Progress Notes (Signed)
37 year old female patient admitted with Bipolar disorder. Patient appears little irritable but answer questions in one word. Patient states the reason for this hospitalization is " nothing." Patient denies SI,HI and AVH. Skin assessment and body search done. No contraband found.Made patient comfortable in room. Support and encouragement given.

## 2023-01-30 NOTE — BH Assessment (Signed)
PATIENT BED AVAILABLE AT 9:30AM ON 01/30/23   Patient is to be admitted to [facility] by Psychiatric Nurse Practitioner Lerry Liner.  Attending Physician will be Dr. Marlou Porch.   Patient [facility] room 304[facility] by [facility] [place].     ER staff is aware of the admission: [facility] ER Secretary   Dr. Derrill Kay, ER MD  Selena Batten Patient's Nurse

## 2023-01-30 NOTE — ED Provider Notes (Signed)
Emergency Medicine Observation Re-evaluation Note  Janice Walls is a 37 y.o. female, seen on rounds today.  Pt initially presented to the ED for complaints of IVC  Currently, the patient is no acute distress. Pt sleeping  Physical Exam  Blood pressure 116/76, pulse (!) 110, temperature 98.6 F (37 C), resp. rate 20, height 5\' 2"  (1.575 m), weight 90.7 kg, SpO2 95%.  Physical Exam General: No apparent distress Pulm: Normal WOB Psych: resting     ED Course / MDM     I have reviewed the labs performed to date as well as medications administered while in observation.  Recent changes in the last 24 hours include none  Plan   Current plan is to continue to wait for bed today in psychiatric hospital  Patient is under full IVC at this time.   Concha Se, MD 01/30/23 [id number]

## 2023-01-30 NOTE — ED Notes (Signed)
Report given to [place], RN @ [facility]

## 2023-01-30 NOTE — Group Note (Signed)
Date:  01/30/2023 Time:  10:02 PM  Group Topic/Focus:  Self Esteem Action Plan:   The focus of this group is to help patients create a plan to continue to build self-esteem after discharge.    Participation Level:  Minimal  Participation Quality:  Intrusive and Inattentive  Affect:  Anxious  Cognitive:  Disorganized and Confused  Insight: Limited  Engagement in Group:  Limited  Modes of Intervention:  Education, Exploration, Problem-solving, and Reality Testing  Additional Comments:     Annel Zunker 01/30/2023, 10:02 PM

## 2023-01-30 NOTE — ED Notes (Signed)
PATIENT BED AVAILABLE AT 9:30AM ON 01/30/23   Patient is to be admitted to [facility] by Psychiatric Nurse Practitioner Lerry Liner.  Attending Physician will be Dr. Marlou Porch.   Patient has been [facility] to room 304[facility] [place].     ER staff is aware of the admission: [facility] ER Secretary   Dr. Derrill Kay, ER MD  Selena Batten Patient's Nurse
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
## 2023-01-30 NOTE — Group Note (Signed)
Recreation Therapy Group Note   Group Topic:Goal Setting  Group Date: 01/30/2023 Start Time: 1000 End Time: 1100 Facilitators: Rosina Lowenstein, LRT, CTRS Location:  Craft Room  Group Description: Scientist, research (physical sciences). Patients were given many different magazines, a glue stick, markers, and a piece of cardstock paper. LRT and pts discussed the importance of having goals in life. LRT and pts discussed the difference between short-term and long-term goals, as well as what a SMART goal is. LRT encouraged pts to create a vision board, with images they picked and then cut out with safety scissors from the magazine, for themselves, that capture their short and long-term goals. LRT encouraged pts to show and explain their vision board to the group.   Goal Area(s) Addressed:  Patient will gain knowledge of short vs. long term goals.  Patient will identify goals for themselves. Patient will practice setting SMART goals. Patient will verbalize their goals to LRT and peers.   Affect/Mood: N/A   Participation Level: Did not attend    Clinical Observations/Individualized Feedback: Janice Walls did not attend group.  Plan: Continue to engage patient in RT group sessions 2-3x/week.   Rosina Lowenstein, LRT, CTRS 01/30/2023 11:44 AM

## 2023-01-31 DIAGNOSIS — F319 Bipolar disorder, unspecified: Secondary | ICD-10-CM

## 2023-01-31 MED ORDER — LITHIUM CARBONATE ER 300 MG PO TBCR
300.0000 mg | EXTENDED_RELEASE_TABLET | Freq: Two times a day (BID) | ORAL | Status: DC
  Administered 2023-01-31 – 2023-02-06 (×12): 300 mg via ORAL
  Filled 2023-01-31 (×12): qty 1

## 2023-01-31 MED ORDER — ARIPIPRAZOLE 10 MG PO TABS
10.0000 mg | ORAL_TABLET | Freq: Every day | ORAL | Status: DC
  Administered 2023-01-31 – 2023-02-13 (×14): 10 mg via ORAL
  Filled 2023-01-31 (×14): qty 1

## 2023-01-31 NOTE — Group Note (Signed)
Date:  01/31/2023 Time:  2:22 PM  Group Topic/Focus:  Outdoor recreation structured activity    Participation Level:  Minimal  Participation Quality:  Appropriate  Affect:  Excited  Cognitive:  Alert  Insight: Improving  Engagement in Group:  Developing/Improving  Modes of Intervention:  Activity  Additional Comments:    Janice Walls 01/31/2023, 2:22 PM
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
## 2023-01-31 NOTE — Progress Notes (Signed)
D- Patient alert and oriented.affect/mood. bizarre, she speaks very loud but is cooperative. Denies SI, HI, AVH, and pain. Focused on wanting to leave and she isolated to her room. A- Scheduled medications administered to patient, she declined her Nicoderm patch. per MD orders. Support and encouragement provided.  Routine safety checks conducted every 15 minutes.  Patient informed to notify staff with problems or concerns. R- No adverse drug reactions noted. Patient contracts for safety at this time. Patient compliant with medications and treatment plan. Patient receptive, calm, and cooperative. Patient interacts well with others on the unit.  Patient remains safe at this time.

## 2023-01-31 NOTE — H&P (Signed)
Psychiatric Admission Assessment Adult  Patient Identification: Janice Walls MRN:  [id number] Date of Evaluation:  01/31/2023 Chief Complaint:  Bipolar 1 disorder (HCC) [F31.9] Principal Diagnosis: Bipolar 1 disorder (HCC) Diagnosis:  Principal Problem:   Bipolar 1 disorder (HCC)  History of Present Illness: Janice Walls is a 37 y.o. female with polysubstance use disorder, bipolar 1 presenting today for psychosis. IVC was called by mother after she found patient to be acting abnormal. Reportedly, patient was found stabbing at papers on a table with a steak knife. Also microwaved her cell phone because it was talking to her and threatening to kill others.  During assessment today patient was avoidant.  She made poor eye contact.  At times she would cover her face with blanket.  Patient insight is poor.  She does not know why she is in the hospital.  Patient said" my family wanted me here".  Patient does not recall stabbing papers on table with steak knife.  Today she denies any intention to harm herself or others.  She denies she made threats towards her mother.  She reports she has been off medicines.  She was not able to give the reason why she is not taking her medicine.  Patient was encouraged to take scheduled medicine and participate in milieu.  Patient today denies auditory visual hallucinations.  He was not observed responding to internal stimuli.  Patient is not a reliable historian because of poor insight.  Past Psychiatric History: Reportedly patient has history of bipolar disorder with psychotic symptoms.  Patient was admitted to this facility in April 2024.  She was discharged on Abilify 20 mg by mouth daily, she was also given Abilify Maintena, patient patient was on discharge on lithium 300 mg by mouth twice daily.  Patient reports she has stopped taking medicine few months ago.  Is the patient at risk to self? Yes.    Has the patient been a risk to self in the past 6 months? No.   Has the patient been a risk to self within the distant past? No.  Is the patient a risk to others? Yes.    Has the patient been a risk to others in the past 6 months? No.  Has the patient been a risk to others within the distant past? No.   [place] Scale:  Flowsheet Row Admission (Current) from 01/30/2023 in [facility] INPATIENT BEHAVIORAL MEDICINE ED from 01/29/2023 in [facility] Emergency Department at [facility] Admission (Discharged) from 08/05/2022 in [facility] INPATIENT BEHAVIORAL MEDICINE  C-SSRS RISK CATEGORY No Risk No Risk No Risk        Prior Inpatient Therapy: Yes.     Prior Outpatient Therapy: Yes.     Alcohol Screening: 1. How often do you have a drink containing alcohol?: Monthly or less 2. How many drinks containing alcohol do you have on a typical day when you are drinking?: 1 or 2 3. How often do you have six or more drinks on one occasion?: Never AUDIT-C Score: 1 4. How often during the last year have you found that you were not able to stop drinking once you had started?: Never 5. How often during the last year have you failed to do what was normally expected from you because of drinking?: Never 6. How often during the last year have you needed a first drink in the morning to get yourself going after a heavy drinking session?: Never 7. How often during the last year have you had a feeling of guilt  of remorse after drinking?: Never 8. How often during the last year have you been unable to remember what happened the night before because you had been drinking?: Never 9. Have you or someone else been injured as a result of your drinking?: No 10. Has a relative or friend or a doctor or another health worker been concerned about your drinking or suggested you cut down?: No Alcohol Use Disorder Identification Test Final Score (AUDIT): 1 Alcohol Brief Interventions/Follow-up: Patient Refused Substance Abuse History in the last 12 months:   UDS Negative, patient denies use of  alcohol or illicit drug, or nicotine  Previous Psychotropic Medications: Yes  Psychological Evaluations: Yes  Past Medical History:  Past Medical History:  Diagnosis Date   Anxiety    Bipolar 1 disorder (HCC)    Depression    Insomnia    Substance abuse (HCC)     Past Surgical History:  Procedure Laterality Date   CESAREAN SECTION     CESAREAN SECTION     Family History:  Family History  Problem Relation Age of Onset   CAD Father 65   Family Psychiatric  History: None reported by patient Tobacco Screening:  Social History   Tobacco Use  Smoking Status Every Day   Current packs/day: 1.00   Average packs/day: 1 pack/day for 18.0 years (18.0 ttl pk-yrs)   Types: Cigarettes  Smokeless Tobacco Never    BH Tobacco Counseling     Are you interested in Tobacco Cessation Medications?  Yes, implement Nicotene Replacement Protocol Counseled patient on smoking cessation:  Yes Reason Tobacco Screening Not Completed: No value filed.       Social History:  Social History   Substance and Sexual Activity  Alcohol Use Not Currently   Comment: rare     Social History   Substance and Sexual Activity  Drug Use Not Currently   Types: Marijuana, Methamphetamines    Additional Social History:  Patient reports she lives by herself and she has 2 children                         Allergies:  No Known Allergies Lab Results:  Results for orders placed or performed during the hospital encounter of 01/29/23 (from the past 48 hour(s))  Comprehensive metabolic panel     Status: Abnormal   Collection Time: 01/29/23  5:54 PM  Result Value Ref Range   Sodium 141 135 - 145 mmol/L   Potassium 4.1 3.5 - 5.1 mmol/L   Chloride 111 98 - 111 mmol/L   CO2 21 (L) 22 - 32 mmol/L   Glucose, Bld 112 (H) 70 - 99 mg/dL    Comment: Glucose reference range applies only to samples taken after fasting for at least 8 hours.   BUN 17 6 - 20 mg/dL   Creatinine, Ser 1.61 0.44 - 1.00 mg/dL    Calcium 8.5 (L) 8.9 - 10.3 mg/dL   Total Protein 7.5 6.5 - 8.1 g/dL   Albumin 3.7 3.5 - 5.0 g/dL   AST 29 15 - 41 U/L   ALT 40 0 - 44 U/L   Alkaline Phosphatase 75 38 - 126 U/L   Total Bilirubin 0.4 0.3 - 1.2 mg/dL   GFR, Estimated >09 >60 mL/min    Comment: (NOTE) Calculated using the CKD-EPI Creatinine Equation (2021)    Anion gap 9 5 - 15    Comment: Performed at [facility], [address] [place], [place] [address]  Ethanol     Status: None   Collection Time: 01/29/23  5:54 PM  Result Value Ref Range   Alcohol, Ethyl (B) <10 <10 mg/dL    Comment: (NOTE) Lowest detectable limit for serum alcohol is 10 mg/dL.  For medical purposes only. Performed at [facility], [address] [place], [place] [address]   Salicylate level     Status: Abnormal   Collection Time: 01/29/23  5:54 PM  Result Value Ref Range   Salicylate Lvl <7.0 (L) 7.0 - 30.0 mg/dL    Comment: Performed at [facility], [address] [place], [place] [address]  Acetaminophen level     Status: Abnormal   Collection Time: 01/29/23  5:54 PM  Result Value Ref Range   Acetaminophen (Tylenol), Serum <10 (L) 10 - 30 ug/mL    Comment: (NOTE) Therapeutic concentrations vary significantly. A range of 10-30 ug/mL  may be an effective concentration for many patients. However, some  are best treated at concentrations outside of this range. Acetaminophen concentrations >150 ug/mL at 4 hours after ingestion  and >50 ug/mL at 12 hours after ingestion are often associated with  toxic reactions.  Performed at [facility], [address] [place], [place] [address]   cbc     Status: Abnormal   Collection Time: 01/29/23  5:54 PM  Result Value Ref Range   WBC 13.1 (H) 4.0 - 10.5 K/uL   RBC 5.07 3.87 - 5.11 MIL/uL   Hemoglobin 14.6 12.0 - 15.0 g/dL   HCT 13.0 86.5 - 78.4 %   MCV 87.6 80.0 - 100.0 fL   MCH 28.8 26.0 - 34.0 pg   MCHC 32.9 30.0 - 36.0 g/dL   RDW 69.6 29.5 -  28.4 %   Platelets 285 150 - 400 K/uL   nRBC 0.0 0.0 - 0.2 %    Comment: Performed at [facility], [address], [place], [place] [address]  Urine Drug Screen, Qualitative     Status: None   Collection Time: 01/29/23  9:43 PM  Result Value Ref Range   Tricyclic, Ur Screen NONE DETECTED NONE DETECTED   Amphetamines, Ur Screen NONE DETECTED NONE DETECTED   MDMA (Ecstasy)Ur Screen NONE DETECTED NONE DETECTED   Cocaine Metabolite,Ur [place] NONE DETECTED NONE DETECTED   Opiate, Ur Screen NONE DETECTED NONE DETECTED   Phencyclidine (PCP) Ur S NONE DETECTED NONE DETECTED   Cannabinoid 50 Ng, Ur [place] NONE DETECTED NONE DETECTED   Barbiturates, Ur Screen NONE DETECTED NONE DETECTED   Benzodiazepine, Ur Scrn NONE DETECTED NONE DETECTED   Methadone Scn, Ur NONE DETECTED NONE DETECTED    Comment: (NOTE) Tricyclics + metabolites, urine    Cutoff 1000 ng/mL Amphetamines + metabolites, urine  Cutoff 1000 ng/mL MDMA (Ecstasy), urine              Cutoff 500 ng/mL Cocaine Metabolite, urine          Cutoff 300 ng/mL Opiate + metabolites, urine        Cutoff 300 ng/mL Phencyclidine (PCP), urine         Cutoff 25 ng/mL Cannabinoid, urine                 Cutoff 50 ng/mL Barbiturates + metabolites, urine  Cutoff 200 ng/mL Benzodiazepine, urine              Cutoff 200 ng/mL Methadone, urine  Cutoff 300 ng/mL  The urine drug screen provides only a preliminary, unconfirmed analytical test result and should not be used for non-medical purposes. Clinical consideration and professional judgment should be applied to any positive drug screen result due to possible interfering substances. A more specific alternate chemical method must be used in order to obtain a confirmed analytical result. Gas chromatography / mass spectrometry (GC/MS) is the preferred confirm atory method. Performed at [facility], [address] [place], [place] [address]     Blood Alcohol level:   Lab Results  Component Value Date   [facility] <10 01/29/2023   ETH <10 08/04/2022    Metabolic Disorder Labs:  Lab Results  Component Value Date   HGBA1C 5.2 08/10/2022   MPG 103 08/10/2022   MPG 91 09/12/2016   Lab Results  Component Value Date   PROLACTIN 37.4 (H) 09/12/2016   Lab Results  Component Value Date   CHOL 142 08/10/2022   TRIG 79 08/10/2022   HDL 28 (L) 08/10/2022   CHOLHDL 5.1 08/10/2022   VLDL 16 08/10/2022   LDLCALC 98 08/10/2022   LDLCALC 73 09/12/2016    Current Medications: Current Facility-Administered Medications  Medication Dose Route Frequency Provider Last Rate Last Admin   acetaminophen (TYLENOL) tablet 650 mg  650 mg Oral Q6H PRN Jearld Lesch, NP       alum & mag hydroxide-simeth (MAALOX/MYLANTA) 200-200-20 MG/5ML suspension 30 mL  30 mL Oral Q4H PRN Dixon, Rashaun M, NP       diphenhydrAMINE (BENADRYL) capsule 50 mg  50 mg Oral TID PRN Jearld Lesch, NP       Or   diphenhydrAMINE (BENADRYL) injection 50 mg  50 mg Intramuscular TID PRN Dixon, Rashaun M, NP       haloperidol (HALDOL) tablet 5 mg  5 mg Oral TID PRN Jearld Lesch, NP       Or   haloperidol lactate (HALDOL) injection 5 mg  5 mg Intramuscular TID PRN Jearld Lesch, NP       hydrOXYzine (ATARAX) tablet 25 mg  25 mg Oral TID PRN Jearld Lesch, NP       influenza vac split trivalent PF (FLULAVAL) injection 0.5 mL  0.5 mL Intramuscular Tomorrow-1000 Lewanda Rife, MD       LORazepam (ATIVAN) tablet 2 mg  2 mg Oral TID PRN Jearld Lesch, NP       Or   LORazepam (ATIVAN) injection 2 mg  2 mg Intramuscular TID PRN Dixon, Rashaun M, NP       magnesium hydroxide (MILK OF MAGNESIA) suspension 30 mL  30 mL Oral Daily PRN Dixon, Elray Buba, NP       nicotine (NICODERM CQ - dosed in mg/24 hours) patch 14 mg  14 mg Transdermal Daily Lewanda Rife, MD       traZODone (DESYREL) tablet 50 mg  50 mg Oral QHS PRN Jearld Lesch, NP       PTA Medications: Medications Prior to  Admission  Medication Sig Dispense Refill Last Dose   ARIPiprazole (ABILIFY) 20 MG tablet Take 1 tablet (20 mg total) by mouth daily. (Patient not taking: Reported on 01/29/2023) 10 tablet 0    ARIPiprazole ER (ABILIFY MAINTENA) 400 MG SRER injection Inject 2 mLs (400 mg total) into the muscle every 28 (twenty-eight) days. (Patient not taking: Reported on 01/29/2023) 1 each 1    INGREZZA 80 MG capsule Take 1 capsule (80 mg total) by mouth daily. (  Patient not taking: Reported on 01/29/2023) 30 capsule 1    lithium carbonate (LITHOBID) 300 MG ER tablet Take 1 tablet (300 mg total) by mouth every 12 (twelve) hours. (Patient not taking: Reported on 01/29/2023) 20 tablet 0    metoprolol succinate (TOPROL-XL) 50 MG 24 hr tablet Take 1 tablet (50 mg total) by mouth daily. (Patient not taking: Reported on 01/29/2023) 10 tablet 0    nicotine (NICODERM CQ - DOSED IN MG/24 HOURS) 21 mg/24hr patch Place 1 patch (21 mg total) onto the skin daily. (Patient not taking: Reported on 01/29/2023) 14 patch 0    nicotine polacrilex (NICORETTE) 2 MG gum Take 1 each (2 mg total) by mouth as needed for smoking cessation. (Patient not taking: Reported on 01/29/2023) 50 tablet 0    topiramate (TOPAMAX) 25 MG tablet Take 3 tablets (75 mg total) by mouth daily. (Patient not taking: Reported on 01/29/2023) 30 tablet 0     Musculoskeletal: Strength & Muscle Tone: within normal limits Gait & Station: normal Patient leans: N/A   Psychiatric Specialty Exam:   Presentation  General Appearance:  Disheveled   Eye Contact: Minimal   Speech: Garbled   Speech Volume: Decreased   Handedness: Right     Mood and Affect  Mood: Anxious; Depressed   Affect: Blunt     Thought Process  Thought Processes: Disorganized   Descriptions of Associations:Tangential   Orientation:Full (Time, Place and Person)   Thought Content:Perseveration; Rumination   History of Schizophrenia/Schizoaffective disorder:No   Duration of  Psychotic Symptoms:N/A   Hallucinations:Hallucinations: None   Ideas of Reference:Paranoia   Suicidal Thoughts:Suicidal Thoughts: No   Homicidal Thoughts:Homicidal Thoughts: No Reportedly patient was threatening to kill her mother prior to admission   Sensorium  Memory: Other (comment) (Impaired)   Judgment: Poor   Insight: Poor     Executive Functions  Concentration: Poor   Attention Span: Poor   Recall: Poor   Fund of Knowledge: Poor   Language: Fair     Psychomotor Activity  Psychomotor Activity: Psychomotor Activity: Decreased; Psychomotor Retardation     Assets  Assets: Communication Skills; Desire for Improvement; Housing     Sleep  Sleep: Sleep: Poor       Physical Exam: Physical Exam Constitutional:      Appearance: Normal appearance.  HENT:     Head: Normocephalic and atraumatic.     Nose: Nose normal.     Mouth/Throat:     Mouth: Mucous membranes are moist.  Eyes:     Pupils: Pupils are equal, round, and reactive to light.  Cardiovascular:     Rate and Rhythm: Regular rhythm.  Pulmonary:     Effort: Pulmonary effort is normal.  Skin:    General: Skin is warm.  Neurological:     General: No focal deficit present.     Mental Status: She is alert.      Review of Systems  Constitutional:  Negative for chills and fever.  HENT:  Negative for congestion, hearing loss and sore throat.   Eyes:  Negative for blurred vision and double vision.  Respiratory:  Negative for cough and shortness of breath.   Cardiovascular:  Negative for chest pain and palpitations.  Gastrointestinal:  Negative for nausea and vomiting.  Neurological:  Negative for dizziness and speech change.  Blood pressure (!) 90/53, pulse 80, temperature 98.4 F (36.9 C), temperature source Oral, resp. rate 17, height 5\' 2"  (1.575 m), weight 99.8 kg, SpO2 96%. Body mass index is 40.24  kg/m.  Treatment Plan Summary: Daily contact with patient to assess and evaluate  symptoms and progress in treatment and Medication management  Observation Level/Precautions:  15 minute checks  Laboratory: HbA1c and Lipid profile done in April of 2024  Psychotherapy:    Medications:    Consultations:    Discharge Concerns:    Estimated LOS:10 days  Other:     Physician Treatment Plan for Primary Diagnosis: Bipolar 1 disorder (HCC)  Bipolar disorder manic, with psychotic symptoms  We will start patient on Abilify 10 mg by mouth daily for psychosis and mood stabilization Will start on lithium 300 mg by mouth twice daily for mood stabilization Patient was encouraged to take medicine as prescribed Social worker consult to get collateral and help with a safe discharge plan   Long Term Goal(s): Improvement in symptoms so as ready for discharge  Short Term Goals: Ability to identify changes in lifestyle to reduce recurrence of condition will improve, Ability to verbalize feelings will improve, Ability to disclose and discuss suicidal ideas, Ability to demonstrate self-control will improve, Ability to identify and develop effective coping behaviors will improve, Ability to maintain clinical measurements within normal limits will improve, Compliance with prescribed medications will improve, and Ability to identify triggers associated with substance abuse/mental health issues will improve   I certify that inpatient services furnished can reasonably be expected to improve the patient's condition.    Lewanda Rife, MD

## 2023-01-31 NOTE — Group Note (Signed)
Date:  01/31/2023 Time:  7:17 PM  Group Topic/Focus:  OUTDOOR RECREATION STRUCTURED GROUP ACTIVITY    Participation Level:  Did Not Attend  Janice Walls 01/31/2023, 7:17 PM

## 2023-01-31 NOTE — Group Note (Signed)
Date:  01/31/2023 Time:  11:27 PM  Group Topic/Focus:  Goals Group:   The focus of this group is to help patients establish daily goals to achieve during treatment and discuss how the patient can incorporate goal setting into their daily lives to aide in recovery.    Participation Level:  Active  Participation Quality:  Appropriate and Attentive  Affect:   none  Cognitive:  Appropriate  Insight: None  Engagement in Group:  Lacking  Modes of Intervention:  Education, Exploration, Problem-solving, and Reality Testing  Additional Comments:     Bernardino Dowell 01/31/2023, 11:27 PM

## 2023-01-31 NOTE — Progress Notes (Signed)
   01/31/23 0612  15 Minute Checks  Location Bedroom  Visual Appearance Calm  Behavior Composed  Sleep (Behavioral Health Patients Only)  Calculate sleep? (Click Yes once per 24 hr at 0600 safety check) Yes  Documented sleep last 24 hours 7.5

## 2023-01-31 NOTE — Progress Notes (Deleted)
   01/31/23 0612  15 Minute Checks  Location Bedroom  Visual Appearance Calm  Behavior Composed  Sleep (Behavioral Health Patients Only)  Calculate sleep? (Click Yes once per 24 hr at 0600 safety check) Yes

## 2023-01-31 NOTE — Plan of Care (Signed)
  Problem: Education: Goal: Knowledge of [facility] Education information/materials will improve Outcome: Not Progressing Goal: Mental status will improve Outcome: Not Progressing Goal: Verbalization of understanding the information provided will improve Outcome: Not Progressing   Problem: Health Behavior/Discharge Planning: Goal: Identification of resources available to assist in meeting health care needs will improve Outcome: Not Progressing Goal: Compliance with treatment plan for underlying cause of condition will improve Outcome: Not Progressing   Problem: Coping: Goal: Ability to identify and develop effective coping behavior will improve Outcome: Not Progressing Goal: Ability to interact with others will improve Outcome: Not Progressing Goal: Demonstration of participation in decision-making regarding own care will improve Outcome: Not Progressing Goal: Ability to use eye contact when communicating with others will improve Outcome: Not Progressing

## 2023-01-31 NOTE — Group Note (Signed)
Date:  01/31/2023 Time:  2:12 PM  Group Topic/Focus:  Recovery Goals:   The focus of this group is to identify appropriate goals for recovery and establish a plan to achieve them.    Participation Level:  Minimal  Participation Quality:  Appropriate  Affect:  Flat  Cognitive:  Lacking  Insight: Improving  Engagement in Group:  Lacking  Modes of Intervention:  Activity  Additional Comments:    Tamarion Haymond 01/31/2023, 2:12 PM

## 2023-01-31 NOTE — Group Note (Signed)
LCSW Group Therapy Note  Group Date: 01/31/2023 Start Time: 1300 End Time: 1415   Type of Therapy and Topic:  Group Therapy: Self-Harm Alternatives  Participation Level:  None   Description of Group:   Patients participated in a discussion regarding non-suicidal self-injurious behavior (NSSIB, or self-harm) and the stigma surrounding it. There was also discussion surrounding how other maladaptive coping skills could be seen as self-harm, such as substance abuse. Participants were invited to share their experiences with self-harm, with emphasis being placed on the motivation for self-harm (such as release, punishment, feeling numb, etc). Patients were then asked to brainstorm potential substitutions for self-harm and were provided with a handout entitled, "Distraction Techniques and Alternative Coping Strategies," published by [employer] Self-Injury Recovery.  Therapeutic Goals:  Patients will be given the opportunity to discuss NSSIB in a non-judgmental and therapeutic environment. Patients will identify which feelings lead to NSSIB.  Patients will discuss potential healthy coping skills to replace NSSIB Open discussion will specifically address stigma and shame surrounding NSSIB.   Summary of Patient Progress: Patient came to start of group, however, left stating that she needed to use the restroom, patient did not return.  Therapeutic Modalities:   Cognitive Behavioral Therapy   Harden Mo, LCSWA 01/31/2023  2:30 PM

## 2023-01-31 NOTE — Plan of Care (Signed)
  Problem: Health Behavior/Discharge Planning: Goal: Compliance with treatment plan for underlying cause of condition will improve Outcome: Not Progressing   Problem: Coping: Goal: Ability to identify and develop effective coping behavior will improve Outcome: Not Progressing

## 2023-01-31 NOTE — BH IP Treatment Plan (Addendum)
Interdisciplinary Treatment and Diagnostic Plan Update  01/31/2023 Time of Session: 9:54AM Janice Walls MRN: [id number]  Principal Diagnosis: Bipolar 1 disorder [facility])  Secondary Diagnoses: Principal Problem:   Bipolar 1 disorder (HCC)   Current Medications:  Current Facility-Administered Medications  Medication Dose Route Frequency Provider Last Rate Last Admin   acetaminophen (TYLENOL) tablet 650 mg  650 mg Oral Q6H PRN Jearld Lesch, NP       alum & mag hydroxide-simeth (MAALOX/MYLANTA) 200-200-20 MG/5ML suspension 30 mL  30 mL Oral Q4H PRN Dixon, Rashaun M, NP       ARIPiprazole (ABILIFY) tablet 10 mg  10 mg Oral Daily Lewanda Rife, MD       diphenhydrAMINE (BENADRYL) capsule 50 mg  50 mg Oral TID PRN Jearld Lesch, NP       Or   diphenhydrAMINE (BENADRYL) injection 50 mg  50 mg Intramuscular TID PRN Dixon, Rashaun M, NP       haloperidol (HALDOL) tablet 5 mg  5 mg Oral TID PRN Jearld Lesch, NP       Or   haloperidol lactate (HALDOL) injection 5 mg  5 mg Intramuscular TID PRN Jearld Lesch, NP       hydrOXYzine (ATARAX) tablet 25 mg  25 mg Oral TID PRN Jearld Lesch, NP       influenza vac split trivalent PF (FLULAVAL) injection 0.5 mL  0.5 mL Intramuscular Tomorrow-1000 Lewanda Rife, MD       lithium carbonate (LITHOBID) ER tablet 300 mg  300 mg Oral BID Lewanda Rife, MD       LORazepam (ATIVAN) tablet 2 mg  2 mg Oral TID PRN Jearld Lesch, NP       Or   LORazepam (ATIVAN) injection 2 mg  2 mg Intramuscular TID PRN Dixon, Rashaun M, NP       magnesium hydroxide (MILK OF MAGNESIA) suspension 30 mL  30 mL Oral Daily PRN Dixon, Elray Buba, NP       nicotine (NICODERM CQ - dosed in mg/24 hours) patch 14 mg  14 mg Transdermal Daily Lewanda Rife, MD       traZODone (DESYREL) tablet 50 mg  50 mg Oral QHS PRN Jearld Lesch, NP       PTA Medications: Medications Prior to Admission  Medication Sig Dispense Refill Last Dose   ARIPiprazole  (ABILIFY) 20 MG tablet Take 1 tablet (20 mg total) by mouth daily. (Patient not taking: Reported on 01/29/2023) 10 tablet 0    ARIPiprazole ER (ABILIFY MAINTENA) 400 MG SRER injection Inject 2 mLs (400 mg total) into the muscle every 28 (twenty-eight) days. (Patient not taking: Reported on 01/29/2023) 1 each 1    INGREZZA 80 MG capsule Take 1 capsule (80 mg total) by mouth daily. (Patient not taking: Reported on 01/29/2023) 30 capsule 1    lithium carbonate (LITHOBID) 300 MG ER tablet Take 1 tablet (300 mg total) by mouth every 12 (twelve) hours. (Patient not taking: Reported on 01/29/2023) 20 tablet 0    metoprolol succinate (TOPROL-XL) 50 MG 24 hr tablet Take 1 tablet (50 mg total) by mouth daily. (Patient not taking: Reported on 01/29/2023) 10 tablet 0    nicotine (NICODERM CQ - DOSED IN MG/24 HOURS) 21 mg/24hr patch Place 1 patch (21 mg total) onto the skin daily. (Patient not taking: Reported on 01/29/2023) 14 patch 0    nicotine polacrilex (NICORETTE) 2 MG gum Take 1 each (2 mg total) by mouth as needed  for smoking cessation. (Patient not taking: Reported on 01/29/2023) 50 tablet 0    topiramate (TOPAMAX) 25 MG tablet Take 3 tablets (75 mg total) by mouth daily. (Patient not taking: Reported on 01/29/2023) 30 tablet 0     Patient Stressors: Medication change or noncompliance   Traumatic event    Patient Strengths: Manufacturing systems engineer  Physical Health  Supportive family/friends   Treatment Modalities: Medication Management, Group therapy, Case management,  1 to 1 session with clinician, Psychoeducation, Recreational therapy.   Physician Treatment Plan for Primary Diagnosis: Bipolar 1 disorder (HCC) Long Term Goal(s): Improvement in symptoms so as ready for discharge   Short Term Goals: Ability to identify changes in lifestyle to reduce recurrence of condition will improve Ability to verbalize feelings will improve Ability to disclose and discuss suicidal ideas Ability to demonstrate  self-control will improve Ability to identify and develop effective coping behaviors will improve Ability to maintain clinical measurements within normal limits will improve Compliance with prescribed medications will improve Ability to identify triggers associated with substance abuse/mental health issues will improve  Medication Management: Evaluate patient's response, side effects, and tolerance of medication regimen.  Therapeutic Interventions: 1 to 1 sessions, Unit Group sessions and Medication administration.  Evaluation of Outcomes: Not Progressing  Physician Treatment Plan for Secondary Diagnosis: Principal Problem:   Bipolar 1 disorder (HCC)  Long Term Goal(s): Improvement in symptoms so as ready for discharge   Short Term Goals: Ability to identify changes in lifestyle to reduce recurrence of condition will improve Ability to verbalize feelings will improve Ability to disclose and discuss suicidal ideas Ability to demonstrate self-control will improve Ability to identify and develop effective coping behaviors will improve Ability to maintain clinical measurements within normal limits will improve Compliance with prescribed medications will improve Ability to identify triggers associated with substance abuse/mental health issues will improve     Medication Management: Evaluate patient's response, side effects, and tolerance of medication regimen.  Therapeutic Interventions: 1 to 1 sessions, Unit Group sessions and Medication administration.  Evaluation of Outcomes: Not Progressing   RN Treatment Plan for Primary Diagnosis: Bipolar 1 disorder (HCC) Long Term Goal(s): Knowledge of disease and therapeutic regimen to maintain health will improve  Short Term Goals: Ability to demonstrate self-control, Ability to participate in decision making will improve, Ability to verbalize feelings will improve, Ability to disclose and discuss suicidal ideas, Ability to identify and  develop effective coping behaviors will improve, and Compliance with prescribed medications will improve  Medication Management: RN will administer medications as ordered by provider, will assess and evaluate patient's response and provide education to patient for prescribed medication. RN will report any adverse and/or side effects to prescribing provider.  Therapeutic Interventions: 1 on 1 counseling sessions, Psychoeducation, Medication administration, Evaluate responses to treatment, Monitor vital signs and CBGs as ordered, Perform/monitor CIWA, COWS, AIMS and Fall Risk screenings as ordered, Perform wound care treatments as ordered.  Evaluation of Outcomes: Not Progressing   LCSW Treatment Plan for Primary Diagnosis: Bipolar 1 disorder (HCC) Long Term Goal(s): Safe transition to appropriate next level of care at discharge, Engage patient in therapeutic group addressing interpersonal concerns.  Short Term Goals: Engage patient in aftercare planning with referrals and resources, Increase social support, Increase ability to appropriately verbalize feelings, Increase emotional regulation, Facilitate acceptance of mental health diagnosis and concerns, Facilitate patient progression through stages of change regarding substance use diagnoses and concerns, Identify triggers associated with mental health/substance abuse issues, and Increase skills for wellness and recovery  Therapeutic Interventions: Assess for all discharge needs, 1 to 1 time with Child psychotherapist, Explore available resources and support systems, Assess for adequacy in community support network, Educate family and significant other(s) on suicide prevention, Complete Psychosocial Assessment, Interpersonal group therapy.  Evaluation of Outcomes: Not Progressing   Progress in Treatment: Attending groups: No. Participating in groups: No. Taking medication as prescribed: Yes. Toleration medication: Yes. Family/Significant other contact  made: No, will contact:  CSW to reach out to patient's guardian, Patient understands diagnosis: No. Discussing patient identified problems/goals with staff: Yes. Medical problems stabilized or resolved: Yes. Denies suicidal/homicidal ideation: Yes. Issues/concerns per patient self-inventory: No. Other: none  New problem(s) identified: No, Describe:  none  New Short Term/Long Term Goal(s): detox, elimination of symptoms of psychosis, medication management for mood stabilization; elimination of SI thoughts; development of comprehensive mental wellness/sobriety plan.   Patient Goals:  Patient too disorganized to provide goal at this time.  Patient was fixated on discussing her name, "Janice Walls" and how Janus Molder means invisible.    Discharge Plan or Barriers: CSW to assist to patient in development of appropriate discharge plans.  Review of patient's chart shows a letter from the patients mother stating that patient is in need of long-term care.  Mother is also patient's legal guardian.    Reason for Continuation of Hospitalization: Anxiety Delusions  Depression Hallucinations Medication stabilization Suicidal ideation  Estimated Length of Stay:  1-7 days  Last 3 [place] Suicide Severity Risk Score: Flowsheet Row Admission (Current) from 01/30/2023 in [facility] INPATIENT BEH[facility] MEDICINE ED from 01/29/2023 in [facility] at [facility] Admission (Discharged) from 08/05/2022 in [facility] INPAT[facility] BE[facility] MEDICINE  C-SSRS RISK CATEGORY No Risk No Risk No Risk       Last PHQ 2/9 Scores:     No data to display          Scribe for Treatment Team: Harden Mo, Alexander Mt 01/31/2023 2:43 PM

## 2023-02-01 DIAGNOSIS — F319 Bipolar disorder, unspecified: Secondary | ICD-10-CM | POA: Diagnosis not present

## 2023-02-01 NOTE — Plan of Care (Signed)
  Problem: Education: Goal: Knowledge of [facility] Education information/materials will improve 02/01/2023 1721 by Delos Haring, RN Outcome: Not Progressing 02/01/2023 1126 by Delos Haring, RN Outcome: Not Progressing Goal: Mental status will improve 02/01/2023 1721 by Delos Haring, RN Outcome: Not Progressing 02/01/2023 1126 by Delos Haring, RN Outcome: Not Progressing Goal: Verbalization of understanding the information provided will improve 02/01/2023 1721 by Delos Haring, RN Outcome: Not Progressing 02/01/2023 1126 by Delos Haring, RN Outcome: Not Progressing   Problem: Health Behavior/Discharge Planning: Goal: Identification of resources available to assist in meeting health care needs will improve 02/01/2023 1721 by Delos Haring, RN Outcome: Not Progressing 02/01/2023 1126 by Delos Haring, RN Outcome: Not Progressing Goal: Compliance with treatment plan for underlying cause of condition will improve 02/01/2023 1721 by Delos Haring, RN Outcome: Not Progressing 02/01/2023 1126 by Delos Haring, RN Outcome: Not Progressing   Problem: Coping: Goal: Ability to identify and develop effective coping behavior will improve 02/01/2023 1721 by Delos Haring, RN Outcome: Not Progressing 02/01/2023 1126 by Delos Haring, RN Outcome: Not Progressing Goal: Ability to interact with others will improve 02/01/2023 1721 by Delos Haring, RN Outcome: Not Progressing 02/01/2023 1126 by Delos Haring, RN Outcome: Not Progressing Goal: Demonstration of participation in decision-making regarding own care will improve 02/01/2023 1721 by Delos Haring, RN Outcome: Not Progressing 02/01/2023 1126 by Delos Haring, RN Outcome: Not Progressing Goal: Ability to use eye contact when communicating with others will improve 02/01/2023 1721 by Delos Haring, RN Outcome: Not Progressing 02/01/2023 1126 by Delos Haring, RN Outcome: Not Progressing

## 2023-02-01 NOTE — Progress Notes (Signed)
D- Patient alert and oriented to self . Affect anxious/mood bizarre. Denies SI/ HI/ AVH. Patient observed responding to internal stimuli.. During AM med pass she states "are the babies in my head ok" pulling her hair out to the sides. She denies pain. Patient denies depression and anxiety.  A- Scheduled medications administered to patient, per MD orders. Support and encouragement provided.  Routine safety checks conducted every 15 minutes without incident.  Patient informed to notify staff with problems or concerns. R- No adverse drug reactions noted.  Patient compliant with medications and treatment plan. Patient cooperative and easily able to redirect. She is not attending groups she did go outside for activity. She isolates to her room except for meals and medications. Patient remains safe on the unit at this time.

## 2023-02-01 NOTE — Group Note (Signed)
Date:  02/01/2023 Time:  8:44 PM  Group Topic/Focus:  Stages of Change:   The focus of this group is to explain the stages of change and help patients identify changes they want to make upon discharge.    Participation Level:  Active  Participation Quality:  Appropriate and Attentive  Affect:  Appropriate  Cognitive:  Alert and Appropriate  Insight: Good  Engagement in Group:  Developing/Improving  Modes of Intervention:  Clarification, Discussion, Education, Problem-solving, Rapport Building, Socialization, and Support  Additional Comments:     Derreon Consalvo 02/01/2023, 8:44 PM

## 2023-02-01 NOTE — Group Note (Signed)
Date:  02/01/2023 Time:  2:25 PM  Group Topic/Focus:  Goals Group:   The focus of this group is to help patients establish daily goals to achieve during treatment and discuss how the patient can incorporate goal setting into their daily lives to aide in recovery.    Participation Level:  Did Not Attend  Participation Quality:      Affect:    Cognitive:      Insight: None  Engagement in Group:      Modes of Intervention:   Additional Comments:    Ardelle Anton 02/01/2023, 2:25 PM

## 2023-02-01 NOTE — Group Note (Signed)
Recreation Therapy Group Note   Group Topic:Communication  Group Date: 02/01/2023 Start Time: 1000 End Time: 1100 Facilitators: Rosina Lowenstein, LRT, CTRS Location: Courtyard  Group Description: Emotional Check in. Patient sat and talked with LRT about how they are doing and whatever else is on their mind. LRT provided active listening, reassurance and encouragement. Pts were given the opportunity to listen to music or play cornhole while getting fresh air and sunlight in the courtyard.    Goal Area(s) Addressed: Patient will engage in conversation with LRT. Patient will communicate their wants, needs, or questions.  Patient will practice a new coping skill of "talking to someone".    Clinical Observations/Individualized Feedback: Janice Walls did not attend group.  Plan: Continue to engage patient in RT group sessions 2-3x/week.   Rosina Lowenstein, LRT, CTRS 02/01/2023 11:45 AM

## 2023-02-01 NOTE — Progress Notes (Signed)
Patient presents with flat affect but brightens on approach. Loud but pleasant and cooperative. Denies SI, HI, ACH. States she was told she would be discharged on tomorrow. Bizarre. Isolative to self and room. Did attend group and came out for meds. Med given for sleep with good relief. Encouragement and support provided. Safety checks maintained. Medications given as prescribed. Pt receptive and remains safe on unit with q 15 min checks.

## 2023-02-01 NOTE — Plan of Care (Signed)
  Problem: Education: Goal: Knowledge of [facility] Education information/materials will improve Outcome: Not Progressing Goal: Mental status will improve Outcome: Not Progressing Goal: Verbalization of understanding the information provided will improve Outcome: Not Progressing   Problem: Health Behavior/Discharge Planning: Goal: Identification of resources available to assist in meeting health care needs will improve Outcome: Not Progressing Goal: Compliance with treatment plan for underlying cause of condition will improve Outcome: Not Progressing   Problem: Coping: Goal: Ability to identify and develop effective coping behavior will improve Outcome: Not Progressing Goal: Ability to interact with others will improve Outcome: Not Progressing Goal: Demonstration of participation in decision-making regarding own care will improve Outcome: Not Progressing Goal: Ability to use eye contact when communicating with others will improve Outcome: Not Progressing

## 2023-02-01 NOTE — Progress Notes (Addendum)
[facility] MD Progress Note  02/01/2023 2:28 PM Janice Walls  MRN:  [id number]   Janice Walls is a 37 y.o. female with polysubstance use disorder, bipolar 1 presenting today for psychosis. IVC was called by mother after she found patient to be acting abnormal. Reportedly, patient was found stabbing at papers on a table with a steak knife. Also microwaved her cell phone because it was talking to her and threatening to kill others.   Subjective: Chart reviewed, case discussed in multidisciplinary meeting today.Staff reports patient has been compliant with medications.  Patient has not attended groups yesterday or this morning patient was seen during rounds.  Patient reports she is doing fine today.  Patient was more alert and oriented as compared to yesterday.  Her affect is more animated today.  Patient's insight continues to be poor.  Patient reports that" they showed up and brought me here".  Patient said " I do not know why I am here, the police brought me, I think I should go home today".  Discharge criteria discussed with patient.  Patient was encouraged to participate in milieu and work on a safe discharge plan.  Today patient denies auditory or visual hallucination.  Her thoughts are less disorganized as compared to yesterday. She is able to have a short meaningful conversation.  Principal Problem: Bipolar 1 disorder (HCC) Diagnosis: Principal Problem:   Bipolar 1 disorder (HCC)   Past Psychiatric History:  Reportedly patient has history of bipolar disorder with psychotic symptoms or Schizoaffective d/o.  Patient was admitted to this facility in April 2024.  She was discharged on Abilify 20 mg by mouth daily, she was also given Abilify Maintena, patient patient was on discharge on lithium 300 mg by mouth twice daily.  Patient reports she has stopped taking medicine few months ago.   Past Medical History:  Past Medical History:  Diagnosis Date   Anxiety    Bipolar 1 disorder (HCC)     Depression    Insomnia    Substance abuse (HCC)     Past Surgical History:  Procedure Laterality Date   CESAREAN SECTION     CESAREAN SECTION     Family History:  Family History  Problem Relation Age of Onset   CAD Father 17    Social History:  Social History   Substance and Sexual Activity  Alcohol Use Not Currently   Comment: rare     Social History   Substance and Sexual Activity  Drug Use Not Currently   Types: Marijuana, Methamphetamines    Social History   Socioeconomic History   Marital status: Single    Spouse name: Not on file   Number of children: Not on file   Years of education: Not on file   Highest education level: Not on file  Occupational History   Occupation: unemployed  Tobacco Use   Smoking status: Every Day    Current packs/day: 1.00    Average packs/day: 1 pack/day for 18.0 years (18.0 ttl pk-yrs)    Types: Cigarettes   Smokeless tobacco: Never  Vaping Use   Vaping status: Never Used  Substance and Sexual Activity   Alcohol use: Not Currently    Comment: rare   Drug use: Not Currently    Types: Marijuana, Methamphetamines   Sexual activity: Yes    Birth control/protection: None  Other Topics Concern   Not on file  Social History Narrative   Not on file   Social Determinants of Corporate investment banker  Strain: Not on file  Food Insecurity: Patient Declined (01/30/2023)   Hunger Vital Sign    Worried About Running Out of Food in the Last Year: Patient declined    Ran Out of Food in the Last Year: Patient declined  Transportation Needs: No Transportation Needs (01/30/2023)   PRAPARE - Administrator, Civil Service (Medical): No    Lack of Transportation (Non-Medical): No  Physical Activity: Not on file  Stress: Not on file  Social Connections: Not on file   Additional Social History:                         Sleep: Fair  Appetite:  Fair  Current Medications: Current Facility-Administered  Medications  Medication Dose Route Frequency Provider Last Rate Last Admin   acetaminophen (TYLENOL) tablet 650 mg  650 mg Oral Q6H PRN Dixon, Rashaun M, NP       alum & mag hydroxide-simeth (MAALOX/MYLANTA) 200-200-20 MG/5ML suspension 30 mL  30 mL Oral Q4H PRN Dixon, Rashaun M, NP       ARIPiprazole (ABILIFY) tablet 10 mg  10 mg Oral Daily Lewanda Rife, MD   10 mg at 02/01/23 2536   diphenhydrAMINE (BENADRYL) capsule 50 mg  50 mg Oral TID PRN Jearld Lesch, NP       Or   diphenhydrAMINE (BENADRYL) injection 50 mg  50 mg Intramuscular TID PRN Jearld Lesch, NP       haloperidol (HALDOL) tablet 5 mg  5 mg Oral TID PRN Jearld Lesch, NP       Or   haloperidol lactate (HALDOL) injection 5 mg  5 mg Intramuscular TID PRN Jearld Lesch, NP       hydrOXYzine (ATARAX) tablet 25 mg  25 mg Oral TID PRN Jearld Lesch, NP       influenza vac split trivalent PF (FLULAVAL) injection 0.5 mL  0.5 mL Intramuscular Tomorrow-1000 Lewanda Rife, MD       lithium carbonate (LITHOBID) ER tablet 300 mg  300 mg Oral BID Lewanda Rife, MD   300 mg at 02/01/23 0829   LORazepam (ATIVAN) tablet 2 mg  2 mg Oral TID PRN Jearld Lesch, NP       Or   LORazepam (ATIVAN) injection 2 mg  2 mg Intramuscular TID PRN Jearld Lesch, NP       magnesium hydroxide (MILK OF MAGNESIA) suspension 30 mL  30 mL Oral Daily PRN Dixon, Rashaun M, NP       nicotine (NICODERM CQ - dosed in mg/24 hours) patch 14 mg  14 mg Transdermal Daily Lewanda Rife, MD   14 mg at 02/01/23 0828   traZODone (DESYREL) tablet 50 mg  50 mg Oral QHS PRN Jearld Lesch, NP   50 mg at 01/31/23 2119    Lab Results: No results found for this or any previous visit (from the past 48 hour(s)).  Blood Alcohol level:  Lab Results  Component Value Date   ETH <10 01/29/2023   ETH <10 08/04/2022    Metabolic Disorder Labs: Lab Results  Component Value Date   HGBA1C 5.2 08/10/2022   MPG 103 08/10/2022   MPG 91 09/12/2016    Lab Results  Component Value Date   PROLACTIN 37.4 (H) 09/12/2016   Lab Results  Component Value Date   CHOL 142 08/10/2022   TRIG 79 08/10/2022   HDL 28 (L) 08/10/2022   CHOLHDL 5.1 08/10/2022  VLDL 16 08/10/2022   LDLCALC 98 08/10/2022   LDLCALC 73 09/12/2016    Musculoskeletal: Strength & Muscle Tone: within normal limits Gait & Station: normal Patient leans: N/A   Psychiatric Specialty Exam:   Presentation  General Appearance:  Disheveled   Eye Contact: Minimal   Speech: Garbled   Speech Volume: Decreased   Handedness: Right     Mood and Affect  Mood: "Fine"   Affect: Mood congruent, more animated today     Thought Process  Thought Processes: Less disorganized   Descriptions of Associations: Improving  Orientation:Full (Time, Place and Person)   Thought Content:Perseveration; Rumination   History of Schizophrenia/Schizoaffective disorder: Yes   Duration of Psychotic Symptoms:Less than 6 months   Hallucinations:Hallucinations: None   Ideas of Reference:Paranoia   Suicidal Thoughts:Suicidal Thoughts: No   Homicidal Thoughts:Homicidal Thoughts: No Reportedly patient was threatening to kill her mother prior to admission   Sensorium  Memory: Other (comment) (Impaired)   Judgment: Poor   Insight: Poor     Executive Functions  Concentration: Poor   Attention Span: Poor   Recall: Poor   Fund of Knowledge: Poor   Language: Fair     Psychomotor Activity  Psychomotor Activity: Psychomotor Activity: Normal     Assets  Assets: Manufacturing systems engineer; Desire for Improvement; Housing     Sleep  Sleep: Sleep: Poor       Physical Exam: Physical Exam Constitutional:      Appearance: Normal appearance.  HENT:     Head: Normocephalic and atraumatic.     Nose: Nose normal.     Mouth/Throat:     Mouth: Mucous membranes are moist.  Eyes:     Pupils: Pupils are equal, round, and reactive to light.   Cardiovascular:     Rate and Rhythm: Regular rhythm.  Pulmonary:     Effort: Pulmonary effort is normal.  Skin:    General: Skin is warm.  Neurological:     General: No focal deficit present.     Mental Status: She is alert.      Review of Systems  Constitutional:  Negative for chills and fever.  HENT:  Negative for congestion, hearing loss and sore throat.   Eyes:  Negative for blurred vision and double vision.  Respiratory:  Negative for cough and shortness of breath.   Cardiovascular:  Negative for chest pain and palpitations.  Gastrointestinal:  Negative for nausea and vomiting.   Blood pressure 110/66, pulse 74, temperature 98.3 F (36.8 C), temperature source Oral, resp. rate 20, height 5\' 2"  (1.575 m), weight 99.8 kg, SpO2 99%. Body mass index is 40.24 kg/m.   Treatment Plan Summary: Daily contact with patient to assess and evaluate symptoms and progress in treatment and Medication management  Bipolar disorder manic, with psychotic symptoms   Increase the dose of Abilify to 20 mg by mouth daily for psychosis and mood stabilization 02/01/23 Continue lithium 300 mg by mouth twice daily for mood stabilization Patient was encouraged to take medicine as prescribed Social worker consult to get collateral and help with a safe discharge plan  Lewanda Rife, MD

## 2023-02-01 NOTE — Plan of Care (Signed)
  Problem: Education: Goal: Mental status will improve Outcome: Not Progressing Goal: Verbalization of understanding the information provided will improve Outcome: Not Progressing   Problem: Coping: Goal: Ability to interact with others will improve Outcome: Not Progressing

## 2023-02-02 DIAGNOSIS — F319 Bipolar disorder, unspecified: Secondary | ICD-10-CM | POA: Diagnosis not present

## 2023-02-02 NOTE — Group Note (Signed)
Recreation Therapy Group Note   Group Topic:Leisure Education  Group Date: 02/02/2023 Start Time: 1000 End Time: 1100 Facilitators: Rosina Lowenstein, LRT, CTRS Location:  Craft Room  Group Description: Leisure. Patients were given the option to choose from singing karaoke, coloring mandalas, using oil pastels, journaling, or playing with play-doh. LRT and pts discussed the meaning of leisure, the importance of participating in leisure during their free time/when they're outside of the hospital, as well as how our leisure interests can also serve as coping skills.   Goal Area(s) Addressed:  Patient will identify a current leisure interest.  Patient will learn the definition of "leisure". Patient will practice making a positive decision. Patient will have the opportunity to try a new leisure activity. Patient will communicate with peers and LRT.    Affect/Mood: Appropriate   Participation Level: Active and Engaged   Participation Quality: Independent   Behavior: Bizarre   Speech/Thought Process: Directed   Insight: Fair   Judgement: Fair    Modes of Intervention: Activity   Patient Response to Interventions:  Receptive   Education Outcome:  Acknowledges education   Clinical Observations/Individualized Feedback: Prudie was active in their participation of session activities and group discussion. Pt identified "listening to music and going outside to enjoy nature" as things she does in her free time. Pt chose to play with playdoh and sing karaoke while in group. Pt was observed to be coloring the playdoh with markers and walking to it quietly. Pt kept doing this repetitive motion where she would look up at the ceiling and lean back.   Plan: Continue to engage patient in RT group sessions 2-3x/week.   Rosina Lowenstein, LRT, CTRS 02/02/2023 11:26 AM

## 2023-02-02 NOTE — Plan of Care (Signed)
Pt continues to have delusional thought.  Pt stated her last name is Hitler and she willl be having a lot of babies any minute.

## 2023-02-02 NOTE — Progress Notes (Signed)
[facility] MD Progress Note  02/02/2023  Janice Walls  MRN:  [id number]   Janice Walls is a 37 y.o. female with polysubstance use disorder, bipolar 1 presenting today for psychosis. IVC was called by mother after she found patient to be acting abnormal. Reportedly, patient was found stabbing at papers on a table with a steak knife. Also microwaved her cell phone because it was talking to her and threatening to kill others.   Subjective: Chart reviewed, case discussed in multidisciplinary meeting today.Staff reports patient has been compliant with medications.  Patient today endorsed in "good" mood.  Patient said she has attended few groups.  Patient said that she learned" there are different type of people and we like to help".  Patient said she is also working on her discharge plan.  Patient said she is pregnant and believes she will deliver at least 4 babies.  Patient reports that she has 2 living children age 69 and 57 years, who she claims living with patient's mother.  Patient was encouraged to participate in milieu and work on a safe discharge plan.  Today patient denies auditory or visual hallucination.  Patient denies thoughts of harming herself or others.  She was encouraged to attend group and work on a safe discharge plan.  Principal Problem: Bipolar 1 disorder (HCC) Diagnosis: Principal Problem:   Bipolar 1 disorder (HCC)   Past Psychiatric History:  Reportedly patient has history of bipolar disorder with psychotic symptoms or Schizoaffective d/o.  Patient was admitted to this facility in April 2024.  She was discharged on Abilify 20 mg by mouth daily, she was also given Abilify Maintena, patient patient was on discharge on lithium 300 mg by mouth twice daily.  Patient reports she has stopped taking medicine few months ago.   Past Medical History:  Past Medical History:  Diagnosis Date   Anxiety    Bipolar 1 disorder (HCC)    Depression    Insomnia    Substance abuse (HCC)      Past Surgical History:  Procedure Laterality Date   CESAREAN SECTION     CESAREAN SECTION     Family History:  Family History  Problem Relation Age of Onset   CAD Father 23    Social History:  Social History   Substance and Sexual Activity  Alcohol Use Not Currently   Comment: rare     Social History   Substance and Sexual Activity  Drug Use Not Currently   Types: Marijuana, Methamphetamines    Social History   Socioeconomic History   Marital status: Single    Spouse name: Not on file   Number of children: Not on file   Years of education: Not on file   Highest education level: Not on file  Occupational History   Occupation: unemployed  Tobacco Use   Smoking status: Every Day    Current packs/day: 1.00    Average packs/day: 1 pack/day for 18.0 years (18.0 ttl pk-yrs)    Types: Cigarettes   Smokeless tobacco: Never  Vaping Use   Vaping status: Never Used  Substance and Sexual Activity   Alcohol use: Not Currently    Comment: rare   Drug use: Not Currently    Types: Marijuana, Methamphetamines   Sexual activity: Yes    Birth control/protection: None  Other Topics Concern   Not on file  Social History Narrative   Not on file   Social Determinants of Health   Financial Resource Strain: Not on file  Food  Insecurity: Patient Declined (01/30/2023)   Hunger Vital Sign    Worried About Running Out of Food in the Last Year: Patient declined    Ran Out of Food in the Last Year: Patient declined  Transportation Needs: No Transportation Needs (01/30/2023)   PRAPARE - Administrator, Civil Service (Medical): No    Lack of Transportation (Non-Medical): No  Physical Activity: Not on file  Stress: Not on file  Social Connections: Not on file                      Sleep: Fair  Appetite:  Fair  Current Medications: Current Facility-Administered Medications  Medication Dose Route Frequency Provider Last Rate Last Admin   acetaminophen  (TYLENOL) tablet 650 mg  650 mg Oral Q6H PRN Dixon, Rashaun M, NP       alum & mag hydroxide-simeth (MAALOX/MYLANTA) 200-200-20 MG/5ML suspension 30 mL  30 mL Oral Q4H PRN Dixon, Rashaun M, NP       ARIPiprazole (ABILIFY) tablet 10 mg  10 mg Oral Daily Lewanda Rife, MD   10 mg at 02/02/23 4098   diphenhydrAMINE (BENADRYL) capsule 50 mg  50 mg Oral TID PRN Jearld Lesch, NP       Or   diphenhydrAMINE (BENADRYL) injection 50 mg  50 mg Intramuscular TID PRN Jearld Lesch, NP       haloperidol (HALDOL) tablet 5 mg  5 mg Oral TID PRN Jearld Lesch, NP       Or   haloperidol lactate (HALDOL) injection 5 mg  5 mg Intramuscular TID PRN Jearld Lesch, NP       hydrOXYzine (ATARAX) tablet 25 mg  25 mg Oral TID PRN Jearld Lesch, NP       influenza vac split trivalent PF (FLULAVAL) injection 0.5 mL  0.5 mL Intramuscular Tomorrow-1000 Lewanda Rife, MD       lithium carbonate (LITHOBID) ER tablet 300 mg  300 mg Oral BID Lewanda Rife, MD   300 mg at 02/02/23 0837   LORazepam (ATIVAN) tablet 2 mg  2 mg Oral TID PRN Jearld Lesch, NP       Or   LORazepam (ATIVAN) injection 2 mg  2 mg Intramuscular TID PRN Jearld Lesch, NP       magnesium hydroxide (MILK OF MAGNESIA) suspension 30 mL  30 mL Oral Daily PRN Dixon, Rashaun M, NP       nicotine (NICODERM CQ - dosed in mg/24 hours) patch 14 mg  14 mg Transdermal Daily Lewanda Rife, MD   14 mg at 02/02/23 0835   traZODone (DESYREL) tablet 50 mg  50 mg Oral QHS PRN Jearld Lesch, NP   50 mg at 01/31/23 2119    Lab Results: No results found for this or any previous visit (from the past 48 hour(s)).  Blood Alcohol level:  Lab Results  Component Value Date   ETH <10 01/29/2023   ETH <10 08/04/2022    Metabolic Disorder Labs: Lab Results  Component Value Date   HGBA1C 5.2 08/10/2022   MPG 103 08/10/2022   MPG 91 09/12/2016   Lab Results  Component Value Date   PROLACTIN 37.4 (H) 09/12/2016   Lab Results   Component Value Date   CHOL 142 08/10/2022   TRIG 79 08/10/2022   HDL 28 (L) 08/10/2022   CHOLHDL 5.1 08/10/2022   VLDL 16 08/10/2022   LDLCALC 98 08/10/2022   LDLCALC 73  09/12/2016    Musculoskeletal: Strength & Muscle Tone: within normal limits Gait & Station: normal Patient leans: N/A   Psychiatric Specialty Exam:   Presentation  General Appearance:  Disheveled   Eye Contact: Fair   Speech: Spontaneous with normal rate   Speech Volume: Normal   Handedness: Right     Mood and Affect  Mood: "Good"   Affect: Mood congruent, animated and stable   Thought Process  Thought Processes: Less disorganized   Descriptions of Associations: Improving  Orientation:Full (Time, Place and Person)   Thought Content: Delusional   History of Schizophrenia/Schizoaffective disorder: Yes   Duration of Psychotic Symptoms:Less than 6 months   Hallucinations:Hallucinations: None   Ideas of Reference:Paranoia   Suicidal Thoughts:Suicidal Thoughts: No   Homicidal Thoughts:Homicidal Thoughts: No Reportedly patient was threatening to kill her mother prior to admission   Sensorium  Memory: Improved   Judgment: Limited   Insight: Poor     Executive Functions  Concentration: Improving   Attention Span: Improving     Fund of Knowledge: Limited   Language: Fair     Lexicographer Activity: Psychomotor Activity: Normal     Assets  Assets: Manufacturing systems engineer; Desire for Improvement; Housing     Sleep  Sleep: Sleep: Improved    Physical Exam: Physical Exam Constitutional:      Appearance: Normal appearance.  HENT:     Head: Normocephalic and atraumatic.     Nose: Nose normal.     Mouth/Throat:     Mouth: Mucous membranes are moist.  Eyes:     Pupils: Pupils are equal, round, and reactive to light.  Cardiovascular:     Rate and Rhythm: Regular rhythm.  Pulmonary:     Effort: Pulmonary effort is normal.  Skin:     General: Skin is warm.  Neurological:     General: No focal deficit present.     Mental Status: She is alert.      Review of Systems  Constitutional:  Negative for chills and fever.  HENT:  Negative for congestion, hearing loss and sore throat.   Eyes:  Negative for blurred vision and double vision.  Respiratory:  Negative for cough and shortness of breath.   Cardiovascular:  Negative for chest pain and palpitations.  Gastrointestinal:  Negative for nausea and vomiting.   Blood pressure 114/85, pulse 98, temperature 98.4 F (36.9 C), resp. rate 17, height 5\' 2"  (1.575 m), weight 99.8 kg, SpO2 99%. Body mass index is 40.24 kg/m.   Treatment Plan Summary: Daily contact with patient to assess and evaluate symptoms and progress in treatment and Medication management  Bipolar disorder manic, with psychotic symptoms   Increased the dose of Abilify to 20 mg by mouth daily for psychosis and mood stabilization 02/01/23 Continue lithium 300 mg by mouth twice daily for mood stabilization Patient was encouraged to take medicine as prescribed Social worker consult to get collateral and help with a safe discharge plan  Lewanda Rife, MD

## 2023-02-02 NOTE — Group Note (Signed)
Date:  02/02/2023 Time:  9:12 PM  Group Topic/Focus:  Wrap-Up Group:   The focus of this group is to help patients review their daily goal of treatment and discuss progress on daily workbooks.    Participation Level:  Did Not Attend  Lenore Cordia 02/02/2023, 9:12 PM

## 2023-02-02 NOTE — Progress Notes (Signed)
Nursing Shift Note:  1900-0700  Attended Evening Group: yes Medication Compliant:  no Behavior: delusional; suspicious; boisterous, loud Sleep Quality: good Significant Changes: continues with delusional thought and behavior.    02/02/23 0300  Psych Admission Type (Psych Patients Only)  Admission Status Involuntary  Psychosocial Assessment  Patient Complaints None  Eye Contact Intense  Facial Expression Blank  Affect Irritable;Euphoric  Speech Soft  Interaction Minimal  Motor Activity Slow  Appearance/Hygiene In scrubs  Behavior Characteristics Unwilling to participate  Mood Preoccupied  Thought Process  Coherency Circumstantial  Content Delusions  Delusions Religious;Paranoid;Grandeur  Perception Illusions  Hallucination Auditory  Judgment Poor  Confusion Moderate  Danger to Self  Current suicidal ideation? Denies  Danger to Others  Danger to Others None reported or observed

## 2023-02-02 NOTE — Group Note (Signed)
Date:  02/02/2023 Time:  3:26 PM  Group Topic/Focus:  Activity Group:  The focus of the group is to encourage patients to come outside to the courtyard to get some fresh air and some exercise as well to benefit their mental health treatment.    Participation Level:  Active  Participation Quality:  Appropriate  Affect:  Appropriate  Cognitive:  Appropriate  Insight: Appropriate  Engagement in Group:  Engaged  Modes of Intervention:  Activity  Additional Comments:    Janice Walls 02/02/2023, 3:26 PM

## 2023-02-02 NOTE — Progress Notes (Signed)
   02/02/23 1400  Psych Admission Type (Psych Patients Only)  Admission Status Involuntary  Psychosocial Assessment  Patient Complaints None  Eye Contact Brief  Facial Expression Flat  Affect Irritable  Speech Soft  Interaction Minimal  Motor Activity Slow  Appearance/Hygiene In scrubs  Behavior Characteristics Irritable;Unwilling to participate  Mood Preoccupied;Irritable  Thought Process  Coherency Disorganized  Content Delusions  Delusions Grandeur;Paranoid  Perception Illusions  Hallucination UTA  Judgment Poor  Confusion Moderate  Danger to Self  Current suicidal ideation? Denies  Danger to Others  Danger to Others None reported or observed   Remains delusional. Patient states she is married to "Hitler".  Multiple redirecting.

## 2023-02-03 DIAGNOSIS — F319 Bipolar disorder, unspecified: Secondary | ICD-10-CM | POA: Diagnosis not present

## 2023-02-03 LAB — PREGNANCY, URINE: Preg Test, Ur: NEGATIVE

## 2023-02-03 NOTE — Progress Notes (Signed)
[facility] MD Progress Note  02/03/2023  Janice Walls  MRN:  [id number]   Janice Walls is a 37 y.o. female with polysubstance use disorder, bipolar 1 presenting today for psychosis. IVC was called by mother after she found patient to be acting abnormal. Reportedly, patient was found stabbing at papers on a table with a steak knife. Also microwaved her cell phone because it was talking to her and threatening to kill others.   Subjective: Chart reviewed, case discussed in multidisciplinary meeting today.Staff reports patient has been compliant with medications.  Patient today endorsed in "good" mood.  Patient said she has been attending groups.  Patient continues to report she is pregnant, when informed that her pregnancy test today came back negative patient said" I have this special thing, I can tell when I am pregnant, I do not need any test".  Patient continues to be delusional. Today patient denies auditory or visual hallucination.  Patient denies thoughts of harming herself or others.  She was encouraged to attend group and work on a safe discharge plan.  Principal Problem: Bipolar 1 disorder (HCC) Diagnosis: Principal Problem:   Bipolar 1 disorder (HCC)   Past Psychiatric History:  Reportedly patient has history of bipolar disorder with psychotic symptoms or Schizoaffective d/o.  Patient was admitted to this facility in April 2024.  She was discharged on Abilify 20 mg by mouth daily, she was also given Abilify Maintena, patient patient was on discharge on lithium 300 mg by mouth twice daily.  Patient reports she has stopped taking medicine few months ago.   Past Medical History:  Past Medical History:  Diagnosis Date   Anxiety    Bipolar 1 disorder (HCC)    Depression    Insomnia    Substance abuse (HCC)     Past Surgical History:  Procedure Laterality Date   CESAREAN SECTION     CESAREAN SECTION     Family History:  Family History  Problem Relation Age of Onset   CAD Father 44     Social History:  Social History   Substance and Sexual Activity  Alcohol Use Not Currently   Comment: rare     Social History   Substance and Sexual Activity  Drug Use Not Currently   Types: Marijuana, Methamphetamines    Social History   Socioeconomic History   Marital status: Single    Spouse name: Not on file   Number of children: Not on file   Years of education: Not on file   Highest education level: Not on file  Occupational History   Occupation: unemployed  Tobacco Use   Smoking status: Every Day    Current packs/day: 1.00    Average packs/day: 1 pack/day for 18.0 years (18.0 ttl pk-yrs)    Types: Cigarettes   Smokeless tobacco: Never  Vaping Use   Vaping status: Never Used  Substance and Sexual Activity   Alcohol use: Not Currently    Comment: rare   Drug use: Not Currently    Types: Marijuana, Methamphetamines   Sexual activity: Yes    Birth control/protection: None  Other Topics Concern   Not on file  Social History Narrative   Not on file   Social Determinants of Health   Financial Resource Strain: Not on file  Food Insecurity: Patient Declined (01/30/2023)   Hunger Vital Sign    Worried About Running Out of Food in the Last Year: Patient declined    Ran Out of Food in the Last Year: Patient  declined  Transportation Needs: No Transportation Needs (01/30/2023)   PRAPARE - Administrator, Civil Service (Medical): No    Lack of Transportation (Non-Medical): No  Physical Activity: Not on file  Stress: Not on file  Social Connections: Not on file                      Sleep: Fair  Appetite:  Fair  Current Medications: Current Facility-Administered Medications  Medication Dose Route Frequency Provider Last Rate Last Admin   acetaminophen (TYLENOL) tablet 650 mg  650 mg Oral Q6H PRN Dixon, Rashaun M, NP       alum & mag hydroxide-simeth (MAALOX/MYLANTA) 200-200-20 MG/5ML suspension 30 mL  30 mL Oral Q4H PRN Dixon, Rashaun  M, NP       ARIPiprazole (ABILIFY) tablet 10 mg  10 mg Oral Daily Lewanda Rife, MD   10 mg at 02/03/23 1610   diphenhydrAMINE (BENADRYL) capsule 50 mg  50 mg Oral TID PRN Jearld Lesch, NP       Or   diphenhydrAMINE (BENADRYL) injection 50 mg  50 mg Intramuscular TID PRN Jearld Lesch, NP       haloperidol (HALDOL) tablet 5 mg  5 mg Oral TID PRN Jearld Lesch, NP       Or   haloperidol lactate (HALDOL) injection 5 mg  5 mg Intramuscular TID PRN Jearld Lesch, NP       hydrOXYzine (ATARAX) tablet 25 mg  25 mg Oral TID PRN Jearld Lesch, NP       influenza vac split trivalent PF (FLULAVAL) injection 0.5 mL  0.5 mL Intramuscular Tomorrow-1000 Lewanda Rife, MD       lithium carbonate (LITHOBID) ER tablet 300 mg  300 mg Oral BID Lewanda Rife, MD   300 mg at 02/03/23 0929   LORazepam (ATIVAN) tablet 2 mg  2 mg Oral TID PRN Jearld Lesch, NP       Or   LORazepam (ATIVAN) injection 2 mg  2 mg Intramuscular TID PRN Jearld Lesch, NP       magnesium hydroxide (MILK OF MAGNESIA) suspension 30 mL  30 mL Oral Daily PRN Dixon, Rashaun M, NP       nicotine (NICODERM CQ - dosed in mg/24 hours) patch 14 mg  14 mg Transdermal Daily Lewanda Rife, MD   14 mg at 02/02/23 0835   traZODone (DESYREL) tablet 50 mg  50 mg Oral QHS PRN Jearld Lesch, NP   50 mg at 01/31/23 2119    Lab Results:  Results for orders placed or performed during the hospital encounter of 01/30/23 (from the past 48 hour(s))  Pregnancy, urine     Status: None   Collection Time: 02/03/23  2:35 PM  Result Value Ref Range   Preg Test, Ur NEGATIVE NEGATIVE    Comment:        THE SENSITIVITY OF THIS METHODOLOGY IS >25 mIU/mL. Performed at [facility], [address] [place], [place] [address]     Blood Alcohol level:  Lab Results  Component Value Date   [facility] <10 01/29/2023   ETH <10 08/04/2022    Metabolic Disorder Labs: Lab Results  Component Value Date   HGBA1C 5.2  08/10/2022   MPG 103 08/10/2022   MPG 91 09/12/2016   Lab Results  Component Value Date   PROLACTIN 37.4 (H) 09/12/2016   Lab Results  Component Value Date   CHOL 142  08/10/2022   TRIG 79 08/10/2022   HDL 28 (L) 08/10/2022   CHOLHDL 5.1 08/10/2022   VLDL 16 08/10/2022   LDLCALC 98 08/10/2022   LDLCALC 73 09/12/2016    Musculoskeletal: Strength & Muscle Tone: within normal limits Gait & Station: normal Patient leans: N/A   Psychiatric Specialty Exam:   Presentation  General Appearance:  Disheveled   Eye Contact: Fair   Speech: Spontaneous with normal rate   Speech Volume: Normal   Handedness: Right     Mood and Affect  Mood: "Good"   Affect: Mood congruent, animated and stable   Thought Process  Thought Processes: Less disorganized   Descriptions of Associations: Improving  Orientation:Full (Time, Place and Person)   Thought Content: Delusional   History of Schizophrenia/Schizoaffective disorder: Yes   Duration of Psychotic Symptoms:Less than 6 months   Hallucinations:Hallucinations: None   Ideas of Reference:Paranoia   Suicidal Thoughts:Suicidal Thoughts: No   Homicidal Thoughts:Homicidal Thoughts: No Reportedly patient was threatening to kill her mother prior to admission   Sensorium  Memory: Improved   Judgment: Limited   Insight: Poor     Executive Functions  Concentration: Improving   Attention Span: Improving     Fund of Knowledge: Limited   Language: Fair     Lexicographer Activity: Psychomotor Activity: Normal     Assets  Assets: Manufacturing systems engineer; Desire for Improvement; Housing     Sleep  Sleep: Sleep: Improved    Physical Exam: Physical Exam Constitutional:      Appearance: Normal appearance.  HENT:     Head: Normocephalic and atraumatic.     Nose: Nose normal.     Mouth/Throat:     Mouth: Mucous membranes are moist.  Eyes:     Pupils: Pupils are equal, round,  and reactive to light.  Cardiovascular:     Rate and Rhythm: Regular rhythm.  Pulmonary:     Effort: Pulmonary effort is normal.  Skin:    General: Skin is warm.  Neurological:     General: No focal deficit present.     Mental Status: She is alert.      Review of Systems  Constitutional:  Negative for chills and fever.  HENT:  Negative for congestion, hearing loss and sore throat.   Eyes:  Negative for blurred vision and double vision.  Respiratory:  Negative for cough and shortness of breath.   Cardiovascular:  Negative for chest pain and palpitations.  Gastrointestinal:  Negative for nausea and vomiting.   Blood pressure 120/84, pulse 87, temperature 97.8 F (36.6 C), temperature source Oral, resp. rate 17, height 5\' 2"  (1.575 m), weight 99.8 kg, SpO2 100%. Body mass index is 40.24 kg/m.   Treatment Plan Summary: Daily contact with patient to assess and evaluate symptoms and progress in treatment and Medication management  Bipolar disorder manic, with psychotic symptoms   Increased the dose of Abilify to 20 mg by mouth daily for psychosis and mood stabilization 02/01/23 Continue lithium 300 mg by mouth twice daily for mood stabilization Patient was encouraged to take medicine as prescribed Social worker consult to get collateral and help with a safe discharge plan  Lewanda Rife, MD

## 2023-02-03 NOTE — Progress Notes (Signed)
   02/03/23 1100  Psych Admission Type (Psych Patients Only)  Admission Status Involuntary  Psychosocial Assessment  Patient Complaints None  Eye Contact Brief  Facial Expression Flat  Affect Irritable  Speech Soft  Interaction Minimal  Motor Activity Slow  Appearance/Hygiene In scrubs  Behavior Characteristics Irritable  Mood Preoccupied  Thought Process  Coherency Disorganized  Content Delusions  Delusions Grandeur;Paranoid  Perception Illusions  Hallucination UTA  Judgment Poor  Confusion Moderate  Danger to Self  Current suicidal ideation? Denies  Danger to Others  Danger to Others None reported or observed

## 2023-02-03 NOTE — Group Note (Signed)
LCSW Group Therapy Note   Group Date: 02/03/2023 Start Time: 1300 End Time: 1340   Type of Therapy and Topic: Group Therapy - Building a Support System  Participation Level:  Did Not Attend     Summary of Patient Progress:  The patient did not attend group.       Marshell Levan, LCSWA 02/03/2023  3:05 PM

## 2023-02-03 NOTE — Group Note (Signed)
Date:  02/03/2023 Time:  11:22 AM  Group Topic/Focus:  Activity Group:  The focus of the group is to encourage patients to come out in the courtyard to get some fresh air and some exercise.    Participation Level:  Active  Participation Quality:  Appropriate  Affect:  Appropriate  Cognitive:  Appropriate  Insight: Appropriate  Engagement in Group:  Engaged  Modes of Intervention:  Activity  Additional Comments:    Janice Walls 02/03/2023, 11:22 AM

## 2023-02-03 NOTE — Progress Notes (Signed)
Patient alert and oriented x 1 with periods of confusion to time and situation, affect is blunted, she appears delusional stating her name is Hitler. Patient is not interacting appropriately to peers and staff. Patient was offered emotional support and encouraged to attend group. 15 minutes safety checks maintained will continue to monitor

## 2023-02-03 NOTE — Group Note (Signed)
Date:  02/03/2023 Time:  9:14 PM  Group Topic/Focus:  Wrap-Up Group:   The focus of this group is to help patients review their daily goal of treatment and discuss progress on daily workbooks.    Participation Level:  Active  Participation Quality:  Appropriate and Attentive  Affect:  Appropriate  Cognitive:  Appropriate  Insight: Appropriate and Good  Engagement in Group:  Supportive  Modes of Intervention:  Support  Additional Comments:     Belva Crome 02/03/2023, 9:14 PM

## 2023-02-03 NOTE — Plan of Care (Signed)
  Problem: Education: Goal: Knowledge of [facility] Education information/materials will improve Outcome: Progressing   Problem: Education: Goal: Mental status will improve Outcome: Progressing   

## 2023-02-03 NOTE — Group Note (Signed)
Date:  02/03/2023 Time:  6:06 PM  Group Topic/Focus:  Art therapy Board games puzzles activity    Participation Level:  Did Not Attend   Rosaura Carpenter 02/03/2023, 6:06 PM

## 2023-02-04 DIAGNOSIS — F319 Bipolar disorder, unspecified: Secondary | ICD-10-CM | POA: Diagnosis not present

## 2023-02-04 NOTE — Progress Notes (Signed)
   02/03/23 2000  Psych Admission Type (Psych Patients Only)  Admission Status Involuntary  Psychosocial Assessment  Patient Complaints None  Eye Contact Brief  Facial Expression Flat  Affect Irritable  Speech Soft  Interaction Minimal;Forwards little;Guarded;Defensive  Motor Activity Slow  Appearance/Hygiene Improved  Behavior Characteristics Unwilling to participate;Irritable;Pacing  Mood Preoccupied  Thought Process  Coherency Disorganized  Content Delusions  Delusions Grandeur;Paranoid  Perception Illusions;Derealization  Hallucination None reported or observed  Judgment Poor  Confusion Moderate  Danger to Self  Current suicidal ideation? Denies  Danger to Others  Danger to Others None reported or observed   Patient alert and oriented x 3 with periods of confusion to situation, patient denies SI/HI/AVH but noted responding to internal stimuli. Patient was offered emotional support and reoriented to reality. 15 minutes safety checks maintained will continue closely.

## 2023-02-04 NOTE — Progress Notes (Signed)
Pt is irritable and preoccupied.  Pt is focused on what she is going to name the babies she states she is having.  Pt was compliant with medications this AM  No reports of SI HI or AVH.  Continued monitoring for safety.    02/04/23 1600  Psych Admission Type (Psych Patients Only)  Admission Status Involuntary  Psychosocial Assessment  Patient Complaints None  Eye Contact Brief  Facial Expression Flat  Affect Irritable  Speech Loud  Interaction Guarded  Motor Activity Slow  Appearance/Hygiene In scrubs  Behavior Characteristics Irritable;Unwilling to participate  Mood Preoccupied  Thought Process  Coherency Disorganized  Content Delusions  Delusions Paranoid;Grandeur  Perception Illusions  Hallucination None reported or observed  Judgment Poor  Confusion Moderate  Danger to Self  Current suicidal ideation? Denies  Danger to Others  Danger to Others None reported or observed

## 2023-02-04 NOTE — Plan of Care (Signed)
  Problem: Education: Goal: Mental status will improve Outcome: Progressing   Problem: Health Behavior/Discharge Planning: Goal: Compliance with treatment plan for underlying cause of condition will improve Outcome: Progressing

## 2023-02-04 NOTE — Group Note (Signed)
Date:  02/04/2023 Time:  6:45 PM  Group Topic/Focus:  Stages of Change:   The focus of this group is to explain the stages of change and help patients identify changes they want to make upon discharge.    Participation Level:  Minimal  Participation Quality:  Appropriate  Affect:  Appropriate  Cognitive:  Appropriate  Insight: Improving  Engagement in Group:  Lacking  Modes of Intervention:  Activity  Additional Comments:    Aadya Kindler 02/04/2023, 6:45 PM

## 2023-02-04 NOTE — Group Note (Signed)
Date:  02/04/2023 Time:  7:03 PM  Group Topic/Focus:  Outdoor recreation and music therapy.     Participation Level:  Active  Participation Quality:  Appropriate  Affect:  Appropriate  Cognitive:  Alert  Insight: Appropriate  Engagement in Group:  Developing/Improving  Modes of Intervention:  Activity  Additional Comments:    Ranita Stjulien 02/04/2023, 7:03 PM

## 2023-02-04 NOTE — Plan of Care (Signed)
  Problem: Health Behavior/Discharge Planning: Goal: Identification of resources available to assist in meeting health care needs will improve Outcome: Progressing   Problem: Education: Goal: Mental status will improve Outcome: Progressing   Problem: Coping: Goal: Ability to identify and develop effective coping behavior will improve Outcome: Progressing

## 2023-02-04 NOTE — Progress Notes (Signed)
[facility] MD Progress Note  02/04/2023  Janice Walls  MRN:  [id number]   ennifer Wingate is a 37 y.o. female with polysubstance use disorder, bipolar 1 presenting today for psychosis. IVC was called by mother after she found patient to be acting abnormal. Reportedly, patient was found stabbing at papers on a table with a steak knife. Also microwaved her cell phone because it was talking to her and threatening to kill others.   Subjective: Chart reviewed, case discussed with staff today.Staff reports patient has been compliant with medications.  Patient today endorsed in "good" mood.  Patient reports today that she is working on getting pregnant.  She said" in 2 hours 35 minutes I will be pregnant".  Patient continues to be delusional. Today patient denies auditory or visual hallucination.  Patient denies thoughts of harming herself or others.  She was encouraged to attend group and work on a safe discharge plan.  Principal Problem: Bipolar 1 disorder (HCC) Diagnosis: Principal Problem:   Bipolar 1 disorder (HCC)   Past Psychiatric History:  Reportedly patient has history of bipolar disorder with psychotic symptoms or Schizoaffective d/o.  Patient was admitted to this facility in April 2024.  She was discharged on Abilify 20 mg by mouth daily, she was also given Abilify Maintena, patient patient was on discharge on lithium 300 mg by mouth twice daily.  Patient reports she has stopped taking medicine few months ago.   Past Medical History:  Past Medical History:  Diagnosis Date   Anxiety    Bipolar 1 disorder (HCC)    Depression    Insomnia    Substance abuse (HCC)     Past Surgical History:  Procedure Laterality Date   CESAREAN SECTION     CESAREAN SECTION     Family History:  Family History  Problem Relation Age of Onset   CAD Father 18    Social History:  Social History   Substance and Sexual Activity  Alcohol Use Not Currently   Comment: rare     Social History    Substance and Sexual Activity  Drug Use Not Currently   Types: Marijuana, Methamphetamines    Social History   Socioeconomic History   Marital status: Single    Spouse name: Not on file   Number of children: Not on file   Years of education: Not on file   Highest education level: Not on file  Occupational History   Occupation: unemployed  Tobacco Use   Smoking status: Every Day    Current packs/day: 1.00    Average packs/day: 1 pack/day for 18.0 years (18.0 ttl pk-yrs)    Types: Cigarettes   Smokeless tobacco: Never  Vaping Use   Vaping status: Never Used  Substance and Sexual Activity   Alcohol use: Not Currently    Comment: rare   Drug use: Not Currently    Types: Marijuana, Methamphetamines   Sexual activity: Yes    Birth control/protection: None  Other Topics Concern   Not on file  Social History Narrative   Not on file   Social Determinants of Health   Financial Resource Strain: Not on file  Food Insecurity: Patient Declined (01/30/2023)   Hunger Vital Sign    Worried About Running Out of Food in the Last Year: Patient declined    Ran Out of Food in the Last Year: Patient declined  Transportation Needs: No Transportation Needs (01/30/2023)   PRAPARE - Administrator, Civil Service (Medical): No  Lack of Transportation (Non-Medical): No  Physical Activity: Not on file  Stress: Not on file  Social Connections: Not on file                      Sleep: Fair  Appetite:  Fair  Current Medications: Current Facility-Administered Medications  Medication Dose Route Frequency Provider Last Rate Last Admin   acetaminophen (TYLENOL) tablet 650 mg  650 mg Oral Q6H PRN Dixon, Rashaun M, NP       alum & mag hydroxide-simeth (MAALOX/MYLANTA) 200-200-20 MG/5ML suspension 30 mL  30 mL Oral Q4H PRN Dixon, Rashaun M, NP       ARIPiprazole (ABILIFY) tablet 10 mg  10 mg Oral Daily Lewanda Rife, MD   10 mg at 02/04/23 0935   diphenhydrAMINE  (BENADRYL) capsule 50 mg  50 mg Oral TID PRN Jearld Lesch, NP       Or   diphenhydrAMINE (BENADRYL) injection 50 mg  50 mg Intramuscular TID PRN Jearld Lesch, NP       haloperidol (HALDOL) tablet 5 mg  5 mg Oral TID PRN Jearld Lesch, NP       Or   haloperidol lactate (HALDOL) injection 5 mg  5 mg Intramuscular TID PRN Jearld Lesch, NP       hydrOXYzine (ATARAX) tablet 25 mg  25 mg Oral TID PRN Jearld Lesch, NP       influenza vac split trivalent PF (FLULAVAL) injection 0.5 mL  0.5 mL Intramuscular Tomorrow-1000 Lewanda Rife, MD       lithium carbonate (LITHOBID) ER tablet 300 mg  300 mg Oral BID Lewanda Rife, MD   300 mg at 02/04/23 0935   LORazepam (ATIVAN) tablet 2 mg  2 mg Oral TID PRN Jearld Lesch, NP       Or   LORazepam (ATIVAN) injection 2 mg  2 mg Intramuscular TID PRN Jearld Lesch, NP       magnesium hydroxide (MILK OF MAGNESIA) suspension 30 mL  30 mL Oral Daily PRN Dixon, Rashaun M, NP       nicotine (NICODERM CQ - dosed in mg/24 hours) patch 14 mg  14 mg Transdermal Daily Lewanda Rife, MD   14 mg at 02/02/23 0835   traZODone (DESYREL) tablet 50 mg  50 mg Oral QHS PRN Jearld Lesch, NP   50 mg at 01/31/23 2119    Lab Results:  Results for orders placed or performed during the hospital encounter of 01/30/23 (from the past 48 hour(s))  Pregnancy, urine     Status: None   Collection Time: 02/03/23  2:35 PM  Result Value Ref Range   Preg Test, Ur NEGATIVE NEGATIVE    Comment:        THE SENSITIVITY OF THIS METHODOLOGY IS >25 mIU/mL. Performed at [facility], [address] [place], [place] [address]     Blood Alcohol level:  Lab Results  Component Value Date   [facility] <10 01/29/2023   ETH <10 08/04/2022    Metabolic Disorder Labs: Lab Results  Component Value Date   HGBA1C 5.2 08/10/2022   MPG 103 08/10/2022   MPG 91 09/12/2016   Lab Results  Component Value Date   PROLACTIN 37.4 (H) 09/12/2016   Lab Results   Component Value Date   CHOL 142 08/10/2022   TRIG 79 08/10/2022   HDL 28 (L) 08/10/2022   CHOLHDL 5.1 08/10/2022   VLDL 16 08/10/2022  LDLCALC 98 08/10/2022   LDLCALC 73 09/12/2016    Musculoskeletal: Strength & Muscle Tone: within normal limits Gait & Station: normal Patient leans: N/A   Psychiatric Specialty Exam:   Presentation  General Appearance:  Disheveled   Eye Contact: Fair   Speech: Spontaneous with normal rate   Speech Volume: Normal   Handedness: Right     Mood and Affect  Mood: "Good"   Affect: Mood congruent, animated and stable   Thought Process  Thought Processes: Less disorganized   Descriptions of Associations: Improving  Orientation:Full (Time, Place and Person)   Thought Content: Delusional   History of Schizophrenia/Schizoaffective disorder: Yes   Duration of Psychotic Symptoms:Less than 6 months   Hallucinations:Hallucinations: None   Ideas of Reference:Paranoia   Suicidal Thoughts:Suicidal Thoughts: No   Homicidal Thoughts:Homicidal Thoughts: No Reportedly patient was threatening to kill her mother prior to admission   Sensorium  Memory: Improved   Judgment: Limited   Insight: Poor     Executive Functions  Concentration: Improving   Attention Span: Improving     Fund of Knowledge: Limited   Language: Fair     Lexicographer Activity: Psychomotor Activity: Normal     Assets  Assets: Manufacturing systems engineer; Desire for Improvement; Housing     Sleep  Sleep: Sleep: Improved    Physical Exam: Physical Exam Constitutional:      Appearance: Normal appearance.  HENT:     Head: Normocephalic and atraumatic.     Nose: Nose normal.     Mouth/Throat:     Mouth: Mucous membranes are moist.  Eyes:     Pupils: Pupils are equal, round, and reactive to light.  Cardiovascular:     Rate and Rhythm: Regular rhythm.  Pulmonary:     Effort: Pulmonary effort is normal.  Skin:     General: Skin is warm.  Neurological:     General: No focal deficit present.     Mental Status: She is alert.      Review of Systems  Constitutional:  Negative for chills and fever.  HENT:  Negative for congestion, hearing loss and sore throat.   Eyes:  Negative for blurred vision and double vision.  Respiratory:  Negative for cough and shortness of breath.   Cardiovascular:  Negative for chest pain and palpitations.  Gastrointestinal:  Negative for nausea and vomiting.   Blood pressure 117/82, pulse 74, temperature (!) 97.5 F (36.4 C), resp. rate 18, height 5\' 2"  (1.575 m), weight 99.8 kg, SpO2 100%. Body mass index is 40.24 kg/m.   Treatment Plan Summary: Daily contact with patient to assess and evaluate symptoms and progress in treatment and Medication management  Bipolar disorder manic, with psychotic symptoms   Increased the dose of Abilify to 20 mg by mouth daily for psychosis and mood stabilization 02/01/23 Continue lithium 300 mg by mouth twice daily for mood stabilization Lithium level tomorrow, 02/06/2023 Patient was encouraged to take medicine as prescribed Social worker consult to get collateral and help with a safe discharge plan  Lewanda Rife, MD

## 2023-02-04 NOTE — Group Note (Signed)
Date:  02/04/2023 Time:  6:53 PM  Group Topic/Focus:  Anger management structured group. A psycho-therapeutic program for anger prevention and control. It has been described as deploying anger successfully. Anger is frequently a result of frustration, or of feeling blocked or thwarted from something the subject feels is important . Outdoor Recreation Structured Activity.    Participation Level:  Active  Participation Quality:  Appropriate  Affect:  Appropriate  Cognitive:  Appropriate  Insight: Appropriate  Engagement in Group:  Developing/Improving  Modes of Intervention:  Activity  Additional Comments:    Dariusz Brase 02/04/2023, 6:53 PM

## 2023-02-04 NOTE — Group Note (Signed)
Date:  02/04/2023 Time:  9:20 PM  Group Topic/Focus:  Wrap-Up Group:   The focus of this group is to help patients review their daily goal of treatment and discuss progress on daily workbooks.    Participation Level:  Active  Participation Quality:  Appropriate, Attentive, Sharing, and Supportive  Affect:  Appropriate  Cognitive:  Appropriate  Insight: Appropriate and Good  Engagement in Group:  Supportive  Modes of Intervention:  Support  Additional Comments:     Belva Crome 02/04/2023, 9:20 PM

## 2023-02-05 DIAGNOSIS — F319 Bipolar disorder, unspecified: Secondary | ICD-10-CM | POA: Diagnosis not present

## 2023-02-05 NOTE — Group Note (Signed)
Date:  02/05/2023 Time:  3:57 PM  Group Topic/Focus:  Activity Group:  The focus of the group is to encourage activity outside in the courtyard for the patients to get some fresh air and encourage exercise for the benefit of their mental health treatment.    Participation Level:  Did Not Attend   Janice Walls 02/05/2023, 3:57 PM

## 2023-02-05 NOTE — Progress Notes (Signed)
Patient suspicious at times and asking questions regarding her medications. Explanation provided. Patient overall cooperative and med compliant. Patient attended at least 1 group but did not attend recreation. No s/s of current distress.

## 2023-02-05 NOTE — Progress Notes (Signed)
[facility] MD Progress Note  02/05/2023  Janice Walls  MRN:  [id number]   Janice Walls is a 37 y.o. female with polysubstance use disorder, bipolar 1 presenting today for psychosis. IVC was called by mother after she found patient to be acting abnormal. Reportedly, patient was found stabbing at papers on a table with a steak knife. Also microwaved her cell phone because it was talking to her and threatening to kill others.   Subjective: Chart reviewed, case discussed with staff today.Staff reports patient has been compliant with medications.  Patient today endorsed in "good" mood.  Today patient said that she" 1 day" pregnant.  Patient said" I am a human Bible".  Patient continues to be delusional and grandiose. Today patient denies auditory or visual hallucination, she said" I hear your voices and seeing white doctor coat".  Patient denies thoughts of harming herself or others.  She was encouraged to attend group and work on a safe discharge plan.  Principal Problem: Bipolar 1 disorder (HCC) Diagnosis: Principal Problem:   Bipolar 1 disorder (HCC)   Past Psychiatric History:  Reportedly patient has history of bipolar disorder with psychotic symptoms or Schizoaffective d/o.  Patient was admitted to this facility in April 2024.  She was discharged on Abilify 20 mg by mouth daily, she was also given Abilify Maintena, patient patient was on discharge on lithium 300 mg by mouth twice daily.  Patient reports she has stopped taking medicine few months ago.   Past Medical History:  Past Medical History:  Diagnosis Date   Anxiety    Bipolar 1 disorder (HCC)    Depression    Insomnia    Substance abuse (HCC)     Past Surgical History:  Procedure Laterality Date   CESAREAN SECTION     CESAREAN SECTION     Family History:  Family History  Problem Relation Age of Onset   CAD Father 25    Social History:  Social History   Substance and Sexual Activity  Alcohol Use Not Currently    Comment: rare     Social History   Substance and Sexual Activity  Drug Use Not Currently   Types: Marijuana, Methamphetamines    Social History   Socioeconomic History   Marital status: Single    Spouse name: Not on file   Number of children: Not on file   Years of education: Not on file   Highest education level: Not on file  Occupational History   Occupation: unemployed  Tobacco Use   Smoking status: Every Day    Current packs/day: 1.00    Average packs/day: 1 pack/day for 18.0 years (18.0 ttl pk-yrs)    Types: Cigarettes   Smokeless tobacco: Never  Vaping Use   Vaping status: Never Used  Substance and Sexual Activity   Alcohol use: Not Currently    Comment: rare   Drug use: Not Currently    Types: Marijuana, Methamphetamines   Sexual activity: Yes    Birth control/protection: None  Other Topics Concern   Not on file  Social History Narrative   Not on file   Social Determinants of Health   Financial Resource Strain: Not on file  Food Insecurity: Patient Declined (01/30/2023)   Hunger Vital Sign    Worried About Running Out of Food in the Last Year: Patient declined    Ran Out of Food in the Last Year: Patient declined  Transportation Needs: No Transportation Needs (01/30/2023)   PRAPARE - Transportation    Lack  of Transportation (Medical): No    Lack of Transportation (Non-Medical): No  Physical Activity: Not on file  Stress: Not on file  Social Connections: Not on file                      Sleep: Fair  Appetite:  Fair  Current Medications: Current Facility-Administered Medications  Medication Dose Route Frequency Provider Last Rate Last Admin   acetaminophen (TYLENOL) tablet 650 mg  650 mg Oral Q6H PRN Dixon, Rashaun M, NP       alum & mag hydroxide-simeth (MAALOX/MYLANTA) 200-200-20 MG/5ML suspension 30 mL  30 mL Oral Q4H PRN Dixon, Rashaun M, NP       ARIPiprazole (ABILIFY) tablet 10 mg  10 mg Oral Daily Lewanda Rife, MD   10 mg at  02/05/23 1610   diphenhydrAMINE (BENADRYL) capsule 50 mg  50 mg Oral TID PRN Jearld Lesch, NP       Or   diphenhydrAMINE (BENADRYL) injection 50 mg  50 mg Intramuscular TID PRN Jearld Lesch, NP       haloperidol (HALDOL) tablet 5 mg  5 mg Oral TID PRN Jearld Lesch, NP       Or   haloperidol lactate (HALDOL) injection 5 mg  5 mg Intramuscular TID PRN Jearld Lesch, NP       hydrOXYzine (ATARAX) tablet 25 mg  25 mg Oral TID PRN Jearld Lesch, NP       influenza vac split trivalent PF (FLULAVAL) injection 0.5 mL  0.5 mL Intramuscular Tomorrow-1000 Lewanda Rife, MD       lithium carbonate (LITHOBID) ER tablet 300 mg  300 mg Oral BID Lewanda Rife, MD   300 mg at 02/05/23 9604   LORazepam (ATIVAN) tablet 2 mg  2 mg Oral TID PRN Jearld Lesch, NP       Or   LORazepam (ATIVAN) injection 2 mg  2 mg Intramuscular TID PRN Jearld Lesch, NP       magnesium hydroxide (MILK OF MAGNESIA) suspension 30 mL  30 mL Oral Daily PRN Dixon, Rashaun M, NP       nicotine (NICODERM CQ - dosed in mg/24 hours) patch 14 mg  14 mg Transdermal Daily Lewanda Rife, MD   14 mg at 02/02/23 0835   traZODone (DESYREL) tablet 50 mg  50 mg Oral QHS PRN Jearld Lesch, NP   50 mg at 01/31/23 2119    Lab Results:  Results for orders placed or performed during the hospital encounter of 01/30/23 (from the past 48 hour(s))  Pregnancy, urine     Status: None   Collection Time: 02/03/23  2:35 PM  Result Value Ref Range   Preg Test, Ur NEGATIVE NEGATIVE    Comment:        THE SENSITIVITY OF THIS METHODOLOGY IS >25 mIU/mL. Performed at [facility], [address] [place], [place] [address]     Blood Alcohol level:  Lab Results  Component Value Date   [facility] <10 01/29/2023   ETH <10 08/04/2022    Metabolic Disorder Labs: Lab Results  Component Value Date   HGBA1C 5.2 08/10/2022   MPG 103 08/10/2022   MPG 91 09/12/2016   Lab Results  Component Value Date   PROLACTIN  37.4 (H) 09/12/2016   Lab Results  Component Value Date   CHOL 142 08/10/2022   TRIG 79 08/10/2022   HDL 28 (L) 08/10/2022   CHOLHDL 5.1 08/10/2022  VLDL 16 08/10/2022   LDLCALC 98 08/10/2022   LDLCALC 73 09/12/2016    Musculoskeletal: Strength & Muscle Tone: within normal limits Gait & Station: normal Patient leans: N/A   Psychiatric Specialty Exam:   Presentation  General Appearance:  Disheveled   Eye Contact: Fair   Speech: Spontaneous with normal rate   Speech Volume: Normal   Handedness: Right     Mood and Affect  Mood: "Good"   Affect: Mood congruent, animated and stable   Thought Process  Thought Processes: Less disorganized   Descriptions of Associations: Improving  Orientation:Full (Time, Place and Person)   Thought Content: Delusional, grandiose   History of Schizophrenia/Schizoaffective disorder: Yes   Duration of Psychotic Symptoms:Less than 6 months   Hallucinations:Hallucinations: None   Ideas of Reference: NoneSuicidal Thoughts:Suicidal Thoughts: No   Homicidal Thoughts:Homicidal Thoughts: No Reportedly patient was threatening to kill her mother prior to admission   Sensorium  Memory: Improved   Judgment: Limited   Insight: Poor     Executive Functions  Concentration: Improving   Attention Span: Improving     Fund of Knowledge: Limited   Language: Fair     Lexicographer Activity: Psychomotor Activity: Normal     Assets  Assets: Manufacturing systems engineer; Desire for Improvement; Housing     Sleep  Sleep: Sleep: Improved    Physical Exam: Physical Exam Constitutional:      Appearance: Normal appearance.  HENT:     Head: Normocephalic and atraumatic.     Nose: Nose normal.     Mouth/Throat:     Mouth: Mucous membranes are moist.  Eyes:     Pupils: Pupils are equal, round, and reactive to light.  Cardiovascular:     Rate and Rhythm: Regular rhythm.  Pulmonary:     Effort:  Pulmonary effort is normal.  Skin:    General: Skin is warm.  Neurological:     General: No focal deficit present.     Mental Status: She is alert.      Review of Systems  Constitutional:  Negative for chills and fever.  HENT:  Negative for congestion, hearing loss and sore throat.   Eyes:  Negative for blurred vision and double vision.  Respiratory:  Negative for cough and shortness of breath.   Cardiovascular:  Negative for chest pain and palpitations.  Gastrointestinal:  Negative for nausea and vomiting.   Blood pressure 102/84, pulse 83, temperature 98.2 F (36.8 C), resp. rate 17, height 5\' 2"  (1.575 m), weight 99.8 kg, SpO2 99%. Body mass index is 40.24 kg/m.   Treatment Plan Summary: Daily contact with patient to assess and evaluate symptoms and progress in treatment and Medication management  Bipolar disorder manic, with psychotic symptoms   Increased the dose of Abilify to 20 mg by mouth daily for psychosis and mood stabilization 02/01/23, patient continues to have delusions of being pregnant, social worker to confirm with family if this is patient's baseline Continue lithium 300 mg by mouth twice daily for mood stabilization Lithium level ordered tomorrow a.m., 02/06/2023 Patient was encouraged to take medicine as prescribed Social worker consult to get collateral and help with a safe discharge plan  Lewanda Rife, MD

## 2023-02-05 NOTE — [facility] Suicide Risk Assessment (Signed)
[facility] INPATIENT:  Family/Significant Other Suicide Prevention Education  Suicide Prevention Education:  Education Completed; legal guardian, Odessa Fleming [phone] has been identified by the patient as the family member/significant other with whom the patient will be residing, and identified as the person(s) who will aid the patient in the event of a mental health crisis (suicidal ideations/suicide attempt).  With written consent from the patient, the family member/significant other has been provided the following suicide prevention education, prior to the and/or following the discharge of the patient.  The suicide prevention education provided includes the following: Suicide risk factors Suicide prevention and interventions National Suicide Hotline telephone number [facility] assessment telephone number [facility] Emergency Assistance 911 [facility] and/or Residential Mobile Crisis Unit telephone number  Request made of family/significant other to: Remove weapons (e.g., guns, rifles, knives), all items previously/currently identified as safety concern.   Remove drugs/medications (over-the-counter, prescriptions, illicit drugs), all items previously/currently identified as a safety concern.  The family member/significant other verbalizes understanding of the suicide prevention education information provided.  The family member/significant other agrees to remove the items of safety concern listed above.  Harden Mo 02/05/2023, 3:58 PM

## 2023-02-05 NOTE — Progress Notes (Signed)
   02/05/23 2000  Psych Admission Type (Psych Patients Only)  Admission Status Involuntary  Psychosocial Assessment  Patient Complaints Suspiciousness  Eye Contact Brief  Facial Expression Flat  Affect Labile  Speech Logical/coherent  Interaction Assertive  Motor Activity Slow  Appearance/Hygiene Disheveled  Behavior Characteristics Cooperative  Mood Labile  Thought Process  Coherency Disorganized  Content Delusions  Delusions Paranoid  Perception Derealization  Hallucination None reported or observed  Judgment Poor  Confusion Mild  Danger to Self  Current suicidal ideation? Denies (Denies)  Danger to Others  Danger to Others None reported or observed

## 2023-02-05 NOTE — Group Note (Signed)
Date:  02/05/2023 Time:  10:16 AM  Group Topic/Focus:  Goals Group:   The focus of this group is to help patients establish daily goals to achieve during treatment and discuss how the patient can incorporate goal setting into their daily lives to aide in recovery.    Participation Level:  None  Participation Quality:  Inattentive  Affect:  Labile and Not Congruent  Cognitive:  Disorganized  Insight: Limited  Engagement in Group:  Lacking and Resistant  Modes of Intervention:  Discussion, Education, and Support  Additional Comments:    Wilford Corner 02/05/2023, 10:16 AM

## 2023-02-05 NOTE — Group Note (Signed)
Date:  02/05/2023 Time:  9:13 PM  Group Topic/Focus:  Personal Choices and Values:   The focus of this group is to help patients assess and explore the importance of values in their lives, how their values affect their decisions, how they express their values and what opposes their expression. Wrap-Up Group:   The focus of this group is to help patients review their daily goal of treatment and discuss progress on daily workbooks.    Participation Level:  Did Not Attend  Additional Comments:  pt came to night time snack and was asking staff about when she would be discharged and who had the information needed to get her discharged. Staff told pt that she would and should have talked with the doctor on the unit and that they are the only ones able to provide discharge request and initial information. Pts questions are not congruent to the conversation started. Pt makes comments like "Will you bring it with you tomorrow" after being told to discuss discharge with doctor. 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   Maglione,Zekiel Torian E 02/05/2023, 9:13 PM

## 2023-02-05 NOTE — Group Note (Signed)
Recreation Therapy Group Note   Group Topic:Problem Solving  Group Date: 02/05/2023 Start Time: 1000 End Time: 1100 Facilitators: Rosina Lowenstein, LRT, CTRS Location:  Craft Room  Group Description: Life Boat. Patients were given the scenario that they are on a boat that is about to become shipwrecked, leaving them stranded on an [place]. They are asked to make a list of 15 different items that they want to take with them when they are stranded on the [place]. Patients are asked to rank their items from most important to least important, #1 being the most important and #15 being the least. Patients will work individually for the first round to come up with 15 items and then pair up with a peer(s) to condense their list and come up with one list of 15 items between the two of them. Patients or LRT will read aloud the 15 different items to the group after each round. LRT facilitated post-activity processing to discuss how this activity can be used in daily life post discharge.   Goal Area(s) Addressed:  Patient will identify priorities, wants and needs. Patient will communicate with LRT and peers. Patient will work collectively as a Administrator, Civil Service. Patient will work on Product manager.    Affect/Mood: Appropriate   Participation Level: Moderate   Participation Quality: Independent   Behavior: Calm and Cooperative   Speech/Thought Process: Loose association   Insight: Limited   Judgement: Limited   Modes of Intervention: Activity   Patient Response to Interventions:  Attentive and Receptive   Education Outcome:  In group clarification offered    Clinical Observations/Individualized Feedback: Janice Walls was mostly active in their participation of session activities and group discussion. Pt identified "bubbles, crabs, water, food, float" as things she will bring with her on the [place]. Pt interacted well with LRT and peers duration of session.   Plan: Continue to engage patient in  RT group sessions 2-3x/week.   Rosina Lowenstein, LRT, CTRS 02/05/2023 11:52 AM

## 2023-02-05 NOTE — BH IP Treatment Plan (Signed)
Interdisciplinary Treatment and Diagnostic Plan Update  02/05/2023 Time of Session: 9:00 AM  Janice Walls MRN: [id number]  Principal Diagnosis: Bipolar 1 disorder (HCC)  Secondary Diagnoses: Principal Problem:   Bipolar 1 disorder (HCC)   Current Medications:  Current Facility-Administered Medications  Medication Dose Route Frequency Provider Last Rate Last Admin   acetaminophen (TYLENOL) tablet 650 mg  650 mg Oral Q6H PRN Jearld Lesch, NP       alum & mag hydroxide-simeth (MAALOX/MYLANTA) 200-200-20 MG/5ML suspension 30 mL  30 mL Oral Q4H PRN Dixon, Rashaun M, NP       ARIPiprazole (ABILIFY) tablet 10 mg  10 mg Oral Daily Lewanda Rife, MD   10 mg at 02/05/23 7846   diphenhydrAMINE (BENADRYL) capsule 50 mg  50 mg Oral TID PRN Jearld Lesch, NP       Or   diphenhydrAMINE (BENADRYL) injection 50 mg  50 mg Intramuscular TID PRN Jearld Lesch, NP       haloperidol (HALDOL) tablet 5 mg  5 mg Oral TID PRN Jearld Lesch, NP       Or   haloperidol lactate (HALDOL) injection 5 mg  5 mg Intramuscular TID PRN Jearld Lesch, NP       hydrOXYzine (ATARAX) tablet 25 mg  25 mg Oral TID PRN Jearld Lesch, NP       influenza vac split trivalent PF (FLULAVAL) injection 0.5 mL  0.5 mL Intramuscular Tomorrow-1000 Lewanda Rife, MD       lithium carbonate (LITHOBID) ER tablet 300 mg  300 mg Oral BID Lewanda Rife, MD   300 mg at 02/05/23 9629   LORazepam (ATIVAN) tablet 2 mg  2 mg Oral TID PRN Jearld Lesch, NP       Or   LORazepam (ATIVAN) injection 2 mg  2 mg Intramuscular TID PRN Jearld Lesch, NP       magnesium hydroxide (MILK OF MAGNESIA) suspension 30 mL  30 mL Oral Daily PRN Dixon, Rashaun M, NP       nicotine (NICODERM CQ - dosed in mg/24 hours) patch 14 mg  14 mg Transdermal Daily Lewanda Rife, MD   14 mg at 02/02/23 0835   traZODone (DESYREL) tablet 50 mg  50 mg Oral QHS PRN Jearld Lesch, NP   50 mg at 01/31/23 2119   PTA  Medications: Medications Prior to Admission  Medication Sig Dispense Refill Last Dose   ARIPiprazole (ABILIFY) 20 MG tablet Take 1 tablet (20 mg total) by mouth daily. (Patient not taking: Reported on 01/29/2023) 10 tablet 0    ARIPiprazole ER (ABILIFY MAINTENA) 400 MG SRER injection Inject 2 mLs (400 mg total) into the muscle every 28 (twenty-eight) days. (Patient not taking: Reported on 01/29/2023) 1 each 1    INGREZZA 80 MG capsule Take 1 capsule (80 mg total) by mouth daily. (Patient not taking: Reported on 01/29/2023) 30 capsule 1    lithium carbonate (LITHOBID) 300 MG ER tablet Take 1 tablet (300 mg total) by mouth every 12 (twelve) hours. (Patient not taking: Reported on 01/29/2023) 20 tablet 0    metoprolol succinate (TOPROL-XL) 50 MG 24 hr tablet Take 1 tablet (50 mg total) by mouth daily. (Patient not taking: Reported on 01/29/2023) 10 tablet 0    nicotine (NICODERM CQ - DOSED IN MG/24 HOURS) 21 mg/24hr patch Place 1 patch (21 mg total) onto the skin daily. (Patient not taking: Reported on 01/29/2023) 14 patch 0    nicotine polacrilex (  NICORETTE) 2 MG gum Take 1 each (2 mg total) by mouth as needed for smoking cessation. (Patient not taking: Reported on 01/29/2023) 50 tablet 0    topiramate (TOPAMAX) 25 MG tablet Take 3 tablets (75 mg total) by mouth daily. (Patient not taking: Reported on 01/29/2023) 30 tablet 0     Patient Stressors: Medication change or noncompliance   Traumatic event    Patient Strengths: Manufacturing systems engineer  Physical Health  Supportive family/friends   Treatment Modalities: Medication Management, Group therapy, Case management,  1 to 1 session with clinician, Psychoeducation, Recreational therapy.   Physician Treatment Plan for Primary Diagnosis: Bipolar 1 disorder (HCC) Long Term Goal(s): Improvement in symptoms so as ready for discharge   Short Term Goals: Ability to identify changes in lifestyle to reduce recurrence of condition will improve Ability to  verbalize feelings will improve Ability to disclose and discuss suicidal ideas Ability to demonstrate self-control will improve Ability to identify and develop effective coping behaviors will improve Ability to maintain clinical measurements within normal limits will improve Compliance with prescribed medications will improve Ability to identify triggers associated with substance abuse/mental health issues will improve  Medication Management: Evaluate patient's response, side effects, and tolerance of medication regimen.  Therapeutic Interventions: 1 to 1 sessions, Unit Group sessions and Medication administration.  Evaluation of Outcomes: Not Progressing  Physician Treatment Plan for Secondary Diagnosis: Principal Problem:   Bipolar 1 disorder (HCC)  Long Term Goal(s): Improvement in symptoms so as ready for discharge   Short Term Goals: Ability to identify changes in lifestyle to reduce recurrence of condition will improve Ability to verbalize feelings will improve Ability to disclose and discuss suicidal ideas Ability to demonstrate self-control will improve Ability to identify and develop effective coping behaviors will improve Ability to maintain clinical measurements within normal limits will improve Compliance with prescribed medications will improve Ability to identify triggers associated with substance abuse/mental health issues will improve     Medication Management: Evaluate patient's response, side effects, and tolerance of medication regimen.  Therapeutic Interventions: 1 to 1 sessions, Unit Group sessions and Medication administration.  Evaluation of Outcomes: Not Progressing   RN Treatment Plan for Primary Diagnosis: Bipolar 1 disorder (HCC) Long Term Goal(s): Knowledge of disease and therapeutic regimen to maintain health will improve  Short Term Goals: Ability to remain free from injury will improve, Ability to verbalize frustration and anger appropriately will  improve, Ability to demonstrate self-control, Ability to participate in decision making will improve, Ability to verbalize feelings will improve, Ability to disclose and discuss suicidal ideas, Ability to identify and develop effective coping behaviors will improve, and Compliance with prescribed medications will improve  Medication Management: RN will administer medications as ordered by provider, will assess and evaluate patient's response and provide education to patient for prescribed medication. RN will report any adverse and/or side effects to prescribing provider.  Therapeutic Interventions: 1 on 1 counseling sessions, Psychoeducation, Medication administration, Evaluate responses to treatment, Monitor vital signs and CBGs as ordered, Perform/monitor CIWA, COWS, AIMS and Fall Risk screenings as ordered, Perform wound care treatments as ordered.  Evaluation of Outcomes: Not Progressing   LCSW Treatment Plan for Primary Diagnosis: Bipolar 1 disorder (HCC) Long Term Goal(s): Safe transition to appropriate next level of care at discharge, Engage patient in therapeutic group addressing interpersonal concerns.  Short Term Goals: Engage patient in aftercare planning with referrals and resources, Increase social support, Increase ability to appropriately verbalize feelings, Increase emotional regulation, Facilitate acceptance of mental health  diagnosis and concerns, Facilitate patient progression through stages of change regarding substance use diagnoses and concerns, Identify triggers associated with mental health/substance abuse issues, and Increase skills for wellness and recovery  Therapeutic Interventions: Assess for all discharge needs, 1 to 1 time with Social worker, Explore available resources and support systems, Assess for adequacy in community support network, Educate family and significant other(s) on suicide prevention, Complete Psychosocial Assessment, Interpersonal group  therapy.  Evaluation of Outcomes: Not Progressing   Progress in Treatment: Attending groups: Yes. and No. Participating in groups: Yes. and No. Taking medication as prescribed: Yes. Toleration medication: Yes. Family/Significant other contact made: Yes, individual(s) contacted:  Odessa Fleming, guardian, [phone] Patient understands diagnosis: Yes. Discussing patient identified problems/goals with staff: Yes. Medical problems stabilized or resolved: Yes. Denies suicidal/homicidal ideation: Yes. Issues/concerns per patient self-inventory: No. Other: None   New problem(s) identified: No, Describe:  none Update 02/05/23: No changes at this time    New Short Term/Long Term Goal(s): detox, elimination of symptoms of psychosis, medication management for mood stabilization; elimination of SI thoughts; development of comprehensive mental wellness/sobriety plan. Update 02/05/23: No changes at this time    Patient Goals:  Patient too disorganized to provide goal at this time.  Patient was fixated on discussing her name, "Avilyn Virtue Hitler Higham" and how Janus Molder means invisible.  Update 02/05/23: No changes at this time    Discharge Plan or Barriers: CSW to assist to patient in development of appropriate discharge plans.  Review of patient's chart shows a letter from the patients mother stating that patient is in need of long-term care.  Mother is also patient's legal guardian.  Update 02/05/23: Pt continues to be delusional about being pregnant, CSW continues working on appropriate discharge plan    Reason for Continuation of Hospitalization: Anxiety Delusions  Depression Hallucinations Medication stabilization Suicidal ideation   Estimated Length of Stay:  1-7 days Update 02/05/23: TBD  Last 3 [place] Suicide Severity Risk Score: Flowsheet Row Admission (Current) from 01/30/2023 in [facility] INPATIENT BEHAV[facility]AL MEDICINE ED from 01/29/2023 in [facility] Emergency Department at [facility] Admission (Discharged) from 08/05/2022 in [facility] INPATIENT BEHAVIORAL MEDICINE  C-SSRS RISK CATEGORY No Risk No Risk No Risk       Last PHQ 2/9 Scores:     No data to display          Scribe for Treatment Team: Elza Rafter, Theresia Majors 02/05/2023 12:43 PM

## 2023-02-05 NOTE — Plan of Care (Signed)
  Problem: Coping: Goal: Ability to identify and develop effective coping behavior will improve Outcome: Progressing   Problem: Coping: Goal: Ability to use eye contact when communicating with others will improve Outcome: Progressing

## 2023-02-05 NOTE — Group Note (Signed)
[facility] LCSW Group Therapy Note   Group Date: 02/01/2023 Start Time: 1315 End Time: 1420   Type of Therapy/Topic:  Group Therapy:  Balance in Life  Participation Level:  Active   Description of Group:    This group will address the concept of balance and how it feels and looks when one is unbalanced. Patients will be encouraged to process areas in their lives that are out of balance, and identify reasons for remaining unbalanced. Facilitators will guide patients utilizing problem- solving interventions to address and correct the stressor making their life unbalanced. Understanding and applying boundaries will be explored and addressed for obtaining  and maintaining a balanced life. Patients will be encouraged to explore ways to assertively make their unbalanced needs known to significant others in their lives, using other group members and facilitator for support and feedback.  Therapeutic Goals: Patient will identify two or more emotions or situations they have that consume much of in their lives. Patient will identify signs/triggers that life has become out of balance:  Patient will identify two ways to set boundaries in order to achieve balance in their lives:  Patient will demonstrate ability to communicate their needs through discussion and/or role plays  Summary of Patient Progress: Pt was highly disorganized during the discussion. A peer became deeply disturbed by pt's comments and had to leave the room even despite pt leaving the room. She was not open or receptive to any feedback/comments from facilitator or peers. Lacks insight at current functioning.     Therapeutic Modalities:   Cognitive Behavioral Therapy Solution-Focused Therapy Assertiveness Training   Glenis Smoker, LCSW
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
## 2023-02-05 NOTE — Plan of Care (Signed)
  Problem: Health Behavior/Discharge Planning: Goal: Compliance with treatment plan for underlying cause of condition will improve Outcome: Progressing   Problem: Coping: Goal: Ability to identify and develop effective coping behavior will improve Outcome: Not Progressing Goal: Ability to use eye contact when communicating with others will improve Outcome: Not Progressing
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
## 2023-02-06 DIAGNOSIS — F319 Bipolar disorder, unspecified: Secondary | ICD-10-CM | POA: Diagnosis not present

## 2023-02-06 LAB — LITHIUM LEVEL: Lithium Lvl: <0.06 mmol/L — ABNORMAL LOW (ref 0.60–1.20)

## 2023-02-06 MED ORDER — ARIPIPRAZOLE ER 400 MG IM SRER
400.0000 mg | INTRAMUSCULAR | Status: DC
  Administered 2023-02-07: 400 mg via INTRAMUSCULAR
  Filled 2023-02-06: qty 2

## 2023-02-06 MED ORDER — QUETIAPINE FUMARATE 100 MG PO TABS
100.0000 mg | ORAL_TABLET | Freq: Every day | ORAL | Status: DC
  Administered 2023-02-07 – 2023-02-12 (×6): 100 mg via ORAL
  Filled 2023-02-06 (×7): qty 1

## 2023-02-06 MED ORDER — LITHIUM CARBONATE ER 450 MG PO TBCR
450.0000 mg | EXTENDED_RELEASE_TABLET | Freq: Two times a day (BID) | ORAL | Status: DC
  Administered 2023-02-06 – 2023-02-13 (×14): 450 mg via ORAL
  Filled 2023-02-06 (×14): qty 1

## 2023-02-06 NOTE — Group Note (Signed)
Recreation Therapy Group Note   Group Topic:Health and Wellness  Group Date: 02/06/2023 Start Time: 1000 End Time: 1100 Facilitators: Rosina Lowenstein, LRT, CTRS Location: Courtyard  Group Description: [employer]. LRT and patients played games of basketball, drew with chalk, and played corn hole while outside in the courtyard while getting fresh air and sunlight. Music was being played in the background. LRT and peers conversed about different games they have played before. LRT encouraged pts to drink water after being outside, sweating and getting their heart rate up.  Goal Area(s) Addressed: Patient will build on frustration tolerance skills. Patients will partake in a competitive play game with peers. Patients will gain knowledge of new leisure interest/hobby.    Affect/Mood: Appropriate   Participation Level: Moderate   Participation Quality: Independent   Behavior: Calm and Cooperative   Speech/Thought Process: Loose association   Insight: Fair   Judgement: Fair    Modes of Intervention: Activity   Patient Response to Interventions:  Receptive   Education Outcome:  In group clarification offered    Clinical Observations/Individualized Feedback: Janice Walls was mostly active in their participation of session activities and group discussion. Pt came late to group and briefly played with the basketball. Pt then laid flat on the ground in the mulch while staring up at the sky, very still. Pt got up and she was covered in mulch. LRT assisted pt with cleaning herself off before coming back inside.    Plan: Continue to engage patient in RT group sessions 2-3x/week.   Rosina Lowenstein, LRT, CTRS 02/06/2023 12:04 PM

## 2023-02-06 NOTE — Progress Notes (Signed)
Pt is generally calm and cooperative with paranoia, declines PM medication and is somewhat isolative but does go to dayroom occasionally.  Pt does not report pain or SI.  Continued monitoring for safety.   02/06/23 2200  Psych Admission Type (Psych Patients Only)  Admission Status Involuntary  Psychosocial Assessment  Patient Complaints Suspiciousness  Eye Contact Brief  Facial Expression Flat  Affect Labile  Speech Logical/coherent  Interaction Assertive  Motor Activity Slow  Appearance/Hygiene Disheveled  Behavior Characteristics Cooperative  Mood Labile  Thought Process  Coherency Disorganized  Content Delusions  Delusions Paranoid  Perception UTA  Hallucination None reported or observed  Judgment Poor  Confusion Mild  Danger to Self  Current suicidal ideation? Denies  Danger to Others  Danger to Others None reported or observed

## 2023-02-06 NOTE — Progress Notes (Signed)
[facility] MD Progress Note  02/06/2023 1:33 PM Guillermo Difrancesco  MRN:  [id number] Subjective: Janice Walls is seen on rounds.  She lives alone but mom has custody.  Mom would like to see her go to the state hospital but because she is not violent as not going to happen.  She says that she has been off of her medications because she could not get them.  She is still delusional that she is pregnant.  Her lithium level came back this morning at 0.6.  She currently denies any suicidal ideation.  She continues to have poor insight.  Mom apparently has guardianship. Principal Problem: Bipolar 1 disorder (HCC) Diagnosis: Principal Problem:   Bipolar 1 disorder (HCC)  Total Time spent with patient: 15 minutes  Past Psychiatric History: Bipolar 1 disorder most recent episode manic  Past Medical History:  Past Medical History:  Diagnosis Date   Anxiety    Bipolar 1 disorder (HCC)    Depression    Insomnia    Substance abuse (HCC)     Past Surgical History:  Procedure Laterality Date   CESAREAN SECTION     CESAREAN SECTION     Family History:  Family History  Problem Relation Age of Onset   CAD Father 35   Family Psychiatric  History: Unremarkable Social History:  Social History   Substance and Sexual Activity  Alcohol Use Not Currently   Comment: rare     Social History   Substance and Sexual Activity  Drug Use Not Currently   Types: Marijuana, Methamphetamines    Social History   Socioeconomic History   Marital status: Single    Spouse name: Not on file   Number of children: Not on file   Years of education: Not on file   Highest education level: Not on file  Occupational History   Occupation: unemployed  Tobacco Use   Smoking status: Every Day    Current packs/day: 1.00    Average packs/day: 1 pack/day for 18.0 years (18.0 ttl pk-yrs)    Types: Cigarettes   Smokeless tobacco: Never  Vaping Use   Vaping status: Never Used  Substance and Sexual Activity   Alcohol use: Not  Currently    Comment: rare   Drug use: Not Currently    Types: Marijuana, Methamphetamines   Sexual activity: Yes    Birth control/protection: None  Other Topics Concern   Not on file  Social History Narrative   Not on file   Social Determinants of Health   Financial Resource Strain: Not on file  Food Insecurity: Patient Declined (01/30/2023)   Hunger Vital Sign    Worried About Running Out of Food in the Last Year: Patient declined    Ran Out of Food in the Last Year: Patient declined  Transportation Needs: No Transportation Needs (01/30/2023)   PRAPARE - Administrator, Civil Service (Medical): No    Lack of Transportation (Non-Medical): No  Physical Activity: Not on file  Stress: Not on file  Social Connections: Not on file   Additional Social History:                         Sleep: Fair  Appetite:  Fair  Current Medications: Current Facility-Administered Medications  Medication Dose Route Frequency Provider Last Rate Last Admin   acetaminophen (TYLENOL) tablet 650 mg  650 mg Oral Q6H PRN Dixon, Elray Buba, NP       alum & mag hydroxide-simeth (MAALOX/MYLANTA)  200-200-20 MG/5ML suspension 30 mL  30 mL Oral Q4H PRN Jearld Lesch, NP       ARIPiprazole (ABILIFY) tablet 10 mg  10 mg Oral Daily Lewanda Rife, MD   10 mg at 02/06/23 1032   diphenhydrAMINE (BENADRYL) capsule 50 mg  50 mg Oral TID PRN Jearld Lesch, NP       Or   diphenhydrAMINE (BENADRYL) injection 50 mg  50 mg Intramuscular TID PRN Jearld Lesch, NP       haloperidol (HALDOL) tablet 5 mg  5 mg Oral TID PRN Jearld Lesch, NP       Or   haloperidol lactate (HALDOL) injection 5 mg  5 mg Intramuscular TID PRN Jearld Lesch, NP       hydrOXYzine (ATARAX) tablet 25 mg  25 mg Oral TID PRN Jearld Lesch, NP       influenza vac split trivalent PF (FLULAVAL) injection 0.5 mL  0.5 mL Intramuscular Tomorrow-1000 Lewanda Rife, MD       lithium carbonate (LITHOBID) ER tablet  300 mg  300 mg Oral BID Lewanda Rife, MD   300 mg at 02/06/23 1031   LORazepam (ATIVAN) tablet 2 mg  2 mg Oral TID PRN Jearld Lesch, NP       Or   LORazepam (ATIVAN) injection 2 mg  2 mg Intramuscular TID PRN Jearld Lesch, NP       magnesium hydroxide (MILK OF MAGNESIA) suspension 30 mL  30 mL Oral Daily PRN Dixon, Rashaun M, NP       nicotine (NICODERM CQ - dosed in mg/24 hours) patch 14 mg  14 mg Transdermal Daily Lewanda Rife, MD   14 mg at 02/02/23 0835   traZODone (DESYREL) tablet 50 mg  50 mg Oral QHS PRN Jearld Lesch, NP   50 mg at 02/05/23 2113    Lab Results:  Results for orders placed or performed during the hospital encounter of 01/30/23 (from the past 48 hour(s))  Lithium level     Status: Abnormal   Collection Time: 02/06/23  5:57 AM  Result Value Ref Range   Lithium Lvl <0.06 (L) 0.60 - 1.20 mmol/L    Comment: Performed at [facility], [address] [place], [place] [address]    Blood Alcohol level:  Lab Results  Component Value Date   [facility] <10 01/29/2023   ETH <10 08/04/2022    Metabolic Disorder Labs: Lab Results  Component Value Date   HGBA1C 5.2 08/10/2022   MPG 103 08/10/2022   MPG 91 09/12/2016   Lab Results  Component Value Date   PROLACTIN 37.4 (H) 09/12/2016   Lab Results  Component Value Date   CHOL 142 08/10/2022   TRIG 79 08/10/2022   HDL 28 (L) 08/10/2022   CHOLHDL 5.1 08/10/2022   VLDL 16 08/10/2022   LDLCALC 98 08/10/2022   LDLCALC 73 09/12/2016    Physical Findings: AIMS:  , ,  ,  ,    CIWA:    COWS:     Musculoskeletal: Strength & Muscle Tone: within normal limits Gait & Station: normal Patient leans: N/A  Psychiatric Specialty Exam:  Presentation  General Appearance:  Disheveled  Eye Contact: Minimal  Speech: Garbled  Speech Volume: Decreased  Handedness: Right   Mood and Affect  Mood: Anxious; Depressed  Affect: Blunt   Thought Process  Thought  Processes: Disorganized  Descriptions of Associations:Tangential  Orientation:Full (Time, Place and Person)  Thought Content:Perseveration;  Rumination  History of Schizophrenia/Schizoaffective disorder:No  Duration of Psychotic Symptoms:N/A  Hallucinations:No data recorded Ideas of Reference:None  Suicidal Thoughts:No data recorded Homicidal Thoughts:No data recorded  Sensorium  Memory: Other (comment) (Impaired)  Judgment: Poor  Insight: Poor   Executive Functions  Concentration: Poor  Attention Span: Poor  Recall: Poor  Fund of Knowledge: Poor  Language: Fair   Psychomotor Activity  Psychomotor Activity:No data recorded  Assets  Assets: Communication Skills; Desire for Improvement; Housing   Sleep  Sleep:No data recorded    Blood pressure (!) 107/92, pulse 95, temperature 98.3 F (36.8 C), temperature source Oral, resp. rate 19, height 5\' 2"  (1.575 m), weight 99.8 kg, SpO2 97%. Body mass index is 40.24 kg/m.   Treatment Plan Summary: Daily contact with patient to assess and evaluate symptoms and progress in treatment, Medication management, and Plan increase lithium to 450 twice a day.  Add Seroquel 100 at bedtime and restart Abilify maintained at 400 mg tomorrow.  Sarina Ill, DO 02/06/2023, 1:33 PM

## 2023-02-06 NOTE — Group Note (Signed)
Date:  02/06/2023 Time:  10:14 AM  Group Topic/Focus:  Goals Group:   The focus of this group is to help patients establish daily goals to achieve during treatment and discuss how the patient can incorporate goal setting into their daily lives to aide in recovery.    Participation Level:  Did Not Attend   Lynelle Smoke [facility] 02/06/2023, 10:14 AM

## 2023-02-06 NOTE — Plan of Care (Signed)
  Problem: Education: Goal: Knowledge of [facility] Education information/materials will improve Outcome: Not Progressing Goal: Mental status will improve Outcome: Not Progressing Goal: Verbalization of understanding the information provided will improve Outcome: Not Progressing   Problem: Health Behavior/Discharge Planning: Goal: Identification of resources available to assist in meeting health care needs will improve Outcome: Not Progressing Goal: Compliance with treatment plan for underlying cause of condition will improve Outcome: Not Progressing   Problem: Coping: Goal: Ability to identify and develop effective coping behavior will improve Outcome: Not Progressing Goal: Ability to interact with others will improve Outcome: Not Progressing Goal: Demonstration of participation in decision-making regarding own care will improve Outcome: Not Progressing Goal: Ability to use eye contact when communicating with others will improve Outcome: Not Progressing

## 2023-02-06 NOTE — Progress Notes (Signed)
D- Patient alert and oriented x 2-3. Affect bizarre/mood . Denies SI/ HI/ AVH. Patient denies pain. Patient denies depression and anxiety. She has "no goals" for today. Refuses to fill out Patient Inventory. Patient states "my mother is mad at me but that's ok". Can and will not tell me about her reasoning. A- Scheduled medications administered to patient, per MD orders. Support and encouragement provided.  Routine safety checks conducted every 15 minutes without incident.  Patient informed to notify staff with problems or concerns and verbalizes understanding. R- No adverse drug reactions noted.  Patient compliant with medications and treatment plan. Patient calm and easily redirected. She interacts well with others on the unit.  Patient remains safe on the unit at this time.

## 2023-02-06 NOTE — Group Note (Signed)
Date:  02/06/2023 Time:  4:46 PM  Group Topic/Focus:  Outdoor Recreation, Activity    Participation Level:  Active  Participation Quality:  Appropriate  Affect:  Appropriate  Cognitive:  Appropriate  Insight: Appropriate  Engagement in Group:  Engaged  Modes of Intervention:  Discussion, Education, and Support  Additional Comments:    Wilford Corner 02/06/2023, 4:46 PM

## 2023-02-07 DIAGNOSIS — F319 Bipolar disorder, unspecified: Secondary | ICD-10-CM | POA: Diagnosis not present

## 2023-02-07 NOTE — Progress Notes (Signed)
D- Patient alert and oriented x 1-2. Affect bizarre/mood congruent and irritable. Denies SI/ HI/ AVH. Patient denies pain. Patient denies depression and anxiety. Patient was agreeable and Abilify Maintenna 400 mg administered IM right gluteal area with no adverse reactions. She states afterwards "it's ok, you all got your way". Her thoughts and conversation is disorganized and bizarre. A- Scheduled medications administered to patient, per MD orders. Support and encouragement provided.  Routine safety checks conducted every 15 minutes without incident.  Patient informed to notify staff with problems or concerns and verbalizes understanding. R- No adverse drug reactions noted.  Patient compliant with medications and treatment plan. Patient  cooperative and easily redirectable. She interacts well with others on the unit.  Patient contracts for safety and  remains safe on the unit at this time.

## 2023-02-07 NOTE — Plan of Care (Signed)
  Problem: Education: Goal: Knowledge of [facility] Education information/materials will improve Outcome: Not Progressing Goal: Mental status will improve Outcome: Not Progressing Goal: Verbalization of understanding the information provided will improve Outcome: Not Progressing   Problem: Health Behavior/Discharge Planning: Goal: Identification of resources available to assist in meeting health care needs will improve Outcome: Not Progressing Goal: Compliance with treatment plan for underlying cause of condition will improve Outcome: Not Progressing   Problem: Coping: Goal: Ability to identify and develop effective coping behavior will improve Outcome: Not Progressing Goal: Ability to interact with others will improve Outcome: Not Progressing Goal: Demonstration of participation in decision-making regarding own care will improve Outcome: Not Progressing Goal: Ability to use eye contact when communicating with others will improve Outcome: Not Progressing

## 2023-02-07 NOTE — Group Note (Signed)
Date:  02/07/2023 Time:  8:43 PM  Group Topic/Focus:  Wrap-Up Group:   The focus of this group is to help patients review their daily goal of treatment and discuss progress on daily workbooks.    Participation Level:  Active  Participation Quality:  Appropriate  Affect:  Appropriate  Cognitive:  Appropriate  Insight: Appropriate  Engagement in Group:  Engaged  Modes of Intervention:  Discussion, Education, and Support  Additional Comments:    Wilford Corner 02/07/2023, 8:43 PM

## 2023-02-07 NOTE — Group Note (Signed)
Date:  02/07/2023 Time:  5:44 PM  Group Topic/Focus:  Goals Group:   The focus of this group is to help patients establish daily goals to achieve during treatment and discuss how the patient can incorporate goal setting into their daily lives to aide in recovery. Outdoor Recreation Structured Activity.   Participation Level:  Minimal  Participation Quality:  Appropriate  Affect:  Appropriate  Cognitive:  Appropriate  Insight: Limited  Engagement in Group:  Limited  Modes of Intervention:  Activity  Additional Comments:    Keithen Capo 02/07/2023, 5:44 PM

## 2023-02-07 NOTE — Group Note (Signed)
Date:  02/07/2023 Time:  5:38 AM  Group Topic/Focus:  Self Care:   The focus of this group is to help patients understand the importance of self-care in order to improve or restore emotional, physical, spiritual, interpersonal, and financial health. Wrap-Up Group:   The focus of this group is to help patients review their daily goal of treatment and discuss progress on daily workbooks.    Participation Level:  Did Not Attend   Katina Dung 02/07/2023, 5:38 AM

## 2023-02-07 NOTE — Progress Notes (Signed)
[facility] MD Progress Note  02/07/2023 1:23 PM Janice Walls  MRN:  [id number] Subjective: Janice Walls is seen on rounds.  She has been compliant with medications and is agreeable to taking her Abilify maintained at 400 mg IM monthly.  She has been pleasant and cooperative.  She has been a little bit paranoid with some delusions and poor insight.  She continues on oral Abilify for 2 more weeks.  No side effects. Nurses report no issues. Principal Problem: Bipolar 1 disorder (HCC) Diagnosis: Principal Problem:   Bipolar 1 disorder (HCC)  Total Time spent with patient: 15 minutes  Past Psychiatric History: Bipolar 1 disorder most recent episode manic  Past Medical History:  Past Medical History:  Diagnosis Date   Anxiety    Bipolar 1 disorder (HCC)    Depression    Insomnia    Substance abuse (HCC)     Past Surgical History:  Procedure Laterality Date   CESAREAN SECTION     CESAREAN SECTION     Family History:  Family History  Problem Relation Age of Onset   CAD Father 49   Family Psychiatric  History: Unremarkable Social History:  Social History   Substance and Sexual Activity  Alcohol Use Not Currently   Comment: rare     Social History   Substance and Sexual Activity  Drug Use Not Currently   Types: Marijuana, Methamphetamines    Social History   Socioeconomic History   Marital status: Single    Spouse name: Not on file   Number of children: Not on file   Years of education: Not on file   Highest education level: Not on file  Occupational History   Occupation: unemployed  Tobacco Use   Smoking status: Every Day    Current packs/day: 1.00    Average packs/day: 1 pack/day for 18.0 years (18.0 ttl pk-yrs)    Types: Cigarettes   Smokeless tobacco: Never  Vaping Use   Vaping status: Never Used  Substance and Sexual Activity   Alcohol use: Not Currently    Comment: rare   Drug use: Not Currently    Types: Marijuana, Methamphetamines   Sexual activity: Yes     Birth control/protection: None  Other Topics Concern   Not on file  Social History Narrative   Not on file   Social Determinants of Health   Financial Resource Strain: Not on file  Food Insecurity: Patient Declined (01/30/2023)   Hunger Vital Sign    Worried About Running Out of Food in the Last Year: Patient declined    Ran Out of Food in the Last Year: Patient declined  Transportation Needs: No Transportation Needs (01/30/2023)   PRAPARE - Administrator, Civil Service (Medical): No    Lack of Transportation (Non-Medical): No  Physical Activity: Not on file  Stress: Not on file  Social Connections: Not on file   Additional Social History:                         Sleep: Good  Appetite:  Good  Current Medications: Current Facility-Administered Medications  Medication Dose Route Frequency Provider Last Rate Last Admin   acetaminophen (TYLENOL) tablet 650 mg  650 mg Oral Q6H PRN Dixon, Rashaun M, NP       alum & mag hydroxide-simeth (MAALOX/MYLANTA) 200-200-20 MG/5ML suspension 30 mL  30 mL Oral Q4H PRN Dixon, Rashaun M, NP       ARIPiprazole (ABILIFY) tablet 10 mg  10 mg Oral Daily Lewanda Rife, MD   10 mg at 02/07/23 0947   ARIPiprazole ER (ABILIFY MAINTENA) injection 400 mg  400 mg Intramuscular Q28 days Sarina Ill, DO   400 mg at 02/07/23 1304   diphenhydrAMINE (BENADRYL) capsule 50 mg  50 mg Oral TID PRN Jearld Lesch, NP       Or   diphenhydrAMINE (BENADRYL) injection 50 mg  50 mg Intramuscular TID PRN Jearld Lesch, NP       haloperidol (HALDOL) tablet 5 mg  5 mg Oral TID PRN Jearld Lesch, NP       Or   haloperidol lactate (HALDOL) injection 5 mg  5 mg Intramuscular TID PRN Jearld Lesch, NP       hydrOXYzine (ATARAX) tablet 25 mg  25 mg Oral TID PRN Jearld Lesch, NP       influenza vac split trivalent PF (FLULAVAL) injection 0.5 mL  0.5 mL Intramuscular Tomorrow-1000 Lewanda Rife, MD       lithium carbonate  (ESKALITH) ER tablet 450 mg  450 mg Oral BID Sarina Ill, DO   450 mg at 02/07/23 0946   LORazepam (ATIVAN) tablet 2 mg  2 mg Oral TID PRN Jearld Lesch, NP       Or   LORazepam (ATIVAN) injection 2 mg  2 mg Intramuscular TID PRN Jearld Lesch, NP       magnesium hydroxide (MILK OF MAGNESIA) suspension 30 mL  30 mL Oral Daily PRN Dixon, Rashaun M, NP       nicotine (NICODERM CQ - dosed in mg/24 hours) patch 14 mg  14 mg Transdermal Daily Lewanda Rife, MD   14 mg at 02/02/23 0835   QUEtiapine (SEROQUEL) tablet 100 mg  100 mg Oral QHS Sarina Ill, DO       traZODone (DESYREL) tablet 50 mg  50 mg Oral QHS PRN Jearld Lesch, NP   50 mg at 02/05/23 2113    Lab Results:  Results for orders placed or performed during the hospital encounter of 01/30/23 (from the past 48 hour(s))  Lithium level     Status: Abnormal   Collection Time: 02/06/23  5:57 AM  Result Value Ref Range   Lithium Lvl <0.06 (L) 0.60 - 1.20 mmol/L    Comment: Performed at [facility], [address] [place], [place] [address]    Blood Alcohol level:  Lab Results  Component Value Date   [facility] <10 01/29/2023   ETH <10 08/04/2022    Metabolic Disorder Labs: Lab Results  Component Value Date   HGBA1C 5.2 08/10/2022   MPG 103 08/10/2022   MPG 91 09/12/2016   Lab Results  Component Value Date   PROLACTIN 37.4 (H) 09/12/2016   Lab Results  Component Value Date   CHOL 142 08/10/2022   TRIG 79 08/10/2022   HDL 28 (L) 08/10/2022   CHOLHDL 5.1 08/10/2022   VLDL 16 08/10/2022   LDLCALC 98 08/10/2022   LDLCALC 73 09/12/2016    Physical Findings: AIMS:  , ,  ,  ,    CIWA:    COWS:     Musculoskeletal: Strength & Muscle Tone: within normal limits Gait & Station: normal Patient leans: N/A  Psychiatric Specialty Exam:  Presentation  General Appearance:  Disheveled  Eye Contact: Minimal  Speech: Garbled  Speech  Volume: Decreased  Handedness: Right   Mood and Affect  Mood: Anxious; Depressed  Affect: Blunt  Thought Process  Thought Processes: Disorganized  Descriptions of Associations:Tangential  Orientation:Full (Time, Place and Person)  Thought Content:Perseveration; Rumination  History of Schizophrenia/Schizoaffective disorder:No  Duration of Psychotic Symptoms:N/A  Hallucinations:No data recorded Ideas of Reference:None  Suicidal Thoughts:No data recorded Homicidal Thoughts:No data recorded  Sensorium  Memory: Other (comment) (Impaired)  Judgment: Poor  Insight: Poor   Executive Functions  Concentration: Poor  Attention Span: Poor  Recall: Poor  Fund of Knowledge: Poor  Language: Fair   Psychomotor Activity  Psychomotor Activity:No data recorded  Assets  Assets: Communication Skills; Desire for Improvement; Housing   Sleep  Sleep:No data recorded    Blood pressure 115/83, pulse 90, temperature (!) 97.5 F (36.4 C), resp. rate 20, height 5\' 2"  (1.575 m), weight 99.8 kg, SpO2 98%. Body mass index is 40.24 kg/m.   Treatment Plan Summary: Daily contact with patient to assess and evaluate symptoms and progress in treatment, Medication management, and Plan Abilify Maintena 400 mg IM today.  Sarina Ill, DO 02/07/2023, 1:23 PM

## 2023-02-07 NOTE — Group Note (Signed)
Recreation Therapy Group Note   Group Topic:Healthy Support Systems  Group Date: 02/07/2023 Start Time: 1010 End Time: 1055 Facilitators: Rosina Lowenstein, LRT, CTRS Location:  Craft Room  Group Description: Straw Bridge. In groups of 3, patients were given 10 plastic drinking straws and an equal length of masking tape. Using the materials provided, patients were instructed to build a free-standing bridge-like structure to suspend an everyday item (ex: deck of cards) off the floor or table surface. All materials were required to be used in Secondary school teacher. LRT facilitated post-activity discussion reviewing the importance of having strong and healthy support systems in our lives. LRT discussed how the people in our lives serve as the tape and the deck of cards we placed on top of our straw structure are the stressors we face in daily life. LRT and pts discussed what happens in our life when things get too heavy for [place], and we don't have strong supports outside of the hospital. Pt shared 2 of their healthy supports in their life aloud in the group.   Goal Area(s) Addressed:  Patient will identify 2 healthy supports in their life. Patient will identify skills to successfully complete activity. Patient will identify correlation of this activity to life post-discharge.  Patient will work on Product manager. Patient will increase team-building and communication skills.    Affect/Mood: N/A   Participation Level: Did not attend    Clinical Observations/Individualized Feedback: Delisia did not attend group.  Plan: Continue to engage patient in RT group sessions 2-3x/week.   Rosina Lowenstein, LRT, CTRS 02/07/2023 11:42 AM

## 2023-02-07 NOTE — Group Note (Signed)
Date:  02/07/2023 Time:  11:34 AM  Group Topic/Focus:  Overcoming Stress:   The focus of this group is to define stress and help patients assess their triggers.    Participation Level:  Did Not Attend   Rosaura Carpenter 02/07/2023, 11:34 AM

## 2023-02-07 NOTE — Plan of Care (Signed)
  Problem: Coping: Goal: Demonstration of participation in decision-making regarding own care will improve Outcome: Progressing   Problem: Coping: Goal: Ability to interact with others will improve Outcome: Progressing

## 2023-02-08 DIAGNOSIS — F319 Bipolar disorder, unspecified: Secondary | ICD-10-CM | POA: Diagnosis not present

## 2023-02-08 NOTE — Progress Notes (Signed)
[facility] MD Progress Note  02/08/2023 2:40 PM Janice Walls  MRN:  [id number] Subjective: Janice Walls is seen on rounds.  She did take Abilify maintained long-acting injection.  She has when she can go home.  I told her I think she has a guardian which is her mom.  She says that she lives alone and that mom exaggerates.  I told her I will talk to social work.  I told her Monday would probably be the earliest Principal Problem: Bipolar 1 disorder (HCC) Diagnosis: Principal Problem:   Bipolar 1 disorder (HCC)  Total Time spent with patient: 15 minutes  Past Psychiatric History: Bipolar disorder  Past Medical History:  Past Medical History:  Diagnosis Date   Anxiety    Bipolar 1 disorder (HCC)    Depression    Insomnia    Substance abuse (HCC)     Past Surgical History:  Procedure Laterality Date   CESAREAN SECTION     CESAREAN SECTION     Family History:  Family History  Problem Relation Age of Onset   CAD Father 31   Family Psychiatric  History: Unremarkable Social History:  Social History   Substance and Sexual Activity  Alcohol Use Not Currently   Comment: rare     Social History   Substance and Sexual Activity  Drug Use Not Currently   Types: Marijuana, Methamphetamines    Social History   Socioeconomic History   Marital status: Single    Spouse name: Not on file   Number of children: Not on file   Years of education: Not on file   Highest education level: Not on file  Occupational History   Occupation: unemployed  Tobacco Use   Smoking status: Every Day    Current packs/day: 1.00    Average packs/day: 1 pack/day for 18.0 years (18.0 ttl pk-yrs)    Types: Cigarettes   Smokeless tobacco: Never  Vaping Use   Vaping status: Never Used  Substance and Sexual Activity   Alcohol use: Not Currently    Comment: rare   Drug use: Not Currently    Types: Marijuana, Methamphetamines   Sexual activity: Yes    Birth control/protection: None  Other Topics Concern    Not on file  Social History Narrative   Not on file   Social Determinants of Health   Financial Resource Strain: Not on file  Food Insecurity: Patient Declined (01/30/2023)   Hunger Vital Sign    Worried About Running Out of Food in the Last Year: Patient declined    Ran Out of Food in the Last Year: Patient declined  Transportation Needs: No Transportation Needs (01/30/2023)   PRAPARE - Administrator, Civil Service (Medical): No    Lack of Transportation (Non-Medical): No  Physical Activity: Not on file  Stress: Not on file  Social Connections: Not on file   Additional Social History:                         Sleep: Good  Appetite:  Good  Current Medications: Current Facility-Administered Medications  Medication Dose Route Frequency Provider Last Rate Last Admin   acetaminophen (TYLENOL) tablet 650 mg  650 mg Oral Q6H PRN Dixon, Rashaun M, NP       alum & mag hydroxide-simeth (MAALOX/MYLANTA) 200-200-20 MG/5ML suspension 30 mL  30 mL Oral Q4H PRN Dixon, Rashaun M, NP       ARIPiprazole (ABILIFY) tablet 10 mg  10 mg Oral  Daily Lewanda Rife, MD   10 mg at 02/08/23 1610   ARIPiprazole ER (ABILIFY MAINTENA) injection 400 mg  400 mg Intramuscular Q28 days Sarina Ill, DO   400 mg at 02/07/23 1304   diphenhydrAMINE (BENADRYL) capsule 50 mg  50 mg Oral TID PRN Jearld Lesch, NP       Or   diphenhydrAMINE (BENADRYL) injection 50 mg  50 mg Intramuscular TID PRN Jearld Lesch, NP       haloperidol (HALDOL) tablet 5 mg  5 mg Oral TID PRN Jearld Lesch, NP       Or   haloperidol lactate (HALDOL) injection 5 mg  5 mg Intramuscular TID PRN Jearld Lesch, NP       hydrOXYzine (ATARAX) tablet 25 mg  25 mg Oral TID PRN Jearld Lesch, NP   25 mg at 02/07/23 2107   influenza vac split trivalent PF (FLULAVAL) injection 0.5 mL  0.5 mL Intramuscular Tomorrow-1000 Lewanda Rife, MD       lithium carbonate (ESKALITH) ER tablet 450 mg  450 mg  Oral BID Sarina Ill, DO   450 mg at 02/08/23 9604   LORazepam (ATIVAN) tablet 2 mg  2 mg Oral TID PRN Jearld Lesch, NP       Or   LORazepam (ATIVAN) injection 2 mg  2 mg Intramuscular TID PRN Jearld Lesch, NP       magnesium hydroxide (MILK OF MAGNESIA) suspension 30 mL  30 mL Oral Daily PRN Dixon, Elray Buba, NP       nicotine (NICODERM CQ - dosed in mg/24 hours) patch 14 mg  14 mg Transdermal Daily Lewanda Rife, MD   14 mg at 02/02/23 0835   QUEtiapine (SEROQUEL) tablet 100 mg  100 mg Oral QHS Sarina Ill, DO   100 mg at 02/07/23 2107   traZODone (DESYREL) tablet 50 mg  50 mg Oral QHS PRN Jearld Lesch, NP   50 mg at 02/07/23 2106    Lab Results: No results found for this or any previous visit (from the past 48 hour(s)).  Blood Alcohol level:  Lab Results  Component Value Date   ETH <10 01/29/2023   ETH <10 08/04/2022    Metabolic Disorder Labs: Lab Results  Component Value Date   HGBA1C 5.2 08/10/2022   MPG 103 08/10/2022   MPG 91 09/12/2016   Lab Results  Component Value Date   PROLACTIN 37.4 (H) 09/12/2016   Lab Results  Component Value Date   CHOL 142 08/10/2022   TRIG 79 08/10/2022   HDL 28 (L) 08/10/2022   CHOLHDL 5.1 08/10/2022   VLDL 16 08/10/2022   LDLCALC 98 08/10/2022   LDLCALC 73 09/12/2016    Physical Findings: AIMS:  , ,  ,  ,    CIWA:    COWS:     Musculoskeletal: Strength & Muscle Tone: within normal limits Gait & Station: normal Patient leans: N/A  Psychiatric Specialty Exam:  Presentation  General Appearance:  Disheveled  Eye Contact: Minimal  Speech: Garbled  Speech Volume: Decreased  Handedness: Right   Mood and Affect  Mood: Anxious; Depressed  Affect: Blunt   Thought Process  Thought Processes: Disorganized  Descriptions of Associations:Tangential  Orientation:Full (Time, Place and Person)  Thought Content:Perseveration; Rumination  History of  Schizophrenia/Schizoaffective disorder:No  Duration of Psychotic Symptoms:N/A  Hallucinations:No data recorded Ideas of Reference:None  Suicidal Thoughts:No data recorded Homicidal Thoughts:No data recorded  Sensorium  Memory:  Other (comment) (Impaired)  Judgment: Poor  Insight: Poor   Executive Functions  Concentration: Poor  Attention Span: Poor  Recall: Poor  Fund of Knowledge: Poor  Language: Fair   Psychomotor Activity  Psychomotor Activity:No data recorded  Assets  Assets: Communication Skills; Desire for Improvement; Housing   Sleep  Sleep:No data recorded    Blood pressure 100/63, pulse 65, temperature 98 F (36.7 C), resp. rate 20, height 5\' 2"  (1.575 m), weight 99.8 kg, SpO2 100%. Body mass index is 40.24 kg/m.   Treatment Plan Summary: Daily contact with patient to assess and evaluate symptoms and progress in treatment, Medication management, and Plan continue current medications.  Sarina Ill, DO 02/08/2023, 2:40 PM

## 2023-02-08 NOTE — Group Note (Signed)
Date:  02/08/2023 Time:  8:57 PM  Group Topic/Focus:  Goals Group:   The focus of this group is to help patients establish daily goals to achieve during treatment and discuss how the patient can incorporate goal setting into their daily lives to aide in recovery.    Participation Level:  Active  Participation Quality:  Appropriate  Affect:  Appropriate  Cognitive:  Appropriate  Insight: Appropriate  Engagement in Group:  Engaged  Modes of Intervention:  Discussion   Lenore Cordia 02/08/2023, 8:57 PM

## 2023-02-08 NOTE — Plan of Care (Signed)
  Problem: Education: Goal: Mental status will improve Outcome: Progressing   Problem: Health Behavior/Discharge Planning: Goal: Compliance with treatment plan for underlying cause of condition will improve 02/08/2023 0907 by Roseanne Reno, RN Outcome: Progressing 02/08/2023 0902 by Roseanne Reno, RN Outcome: Progressing   Problem: Role Relationship: Goal: Ability to communicate needs accurately will improve Outcome: Progressing   Problem: Safety: Goal: Ability to redirect hostility and anger into socially appropriate behaviors will improve Outcome: Progressing   Problem: Self-Care: Goal: Ability to participate in self-care as condition permits will improve Outcome: Progressing

## 2023-02-08 NOTE — Progress Notes (Signed)
   02/07/23 2100  Psych Admission Type (Psych Patients Only)  Admission Status Involuntary  Psychosocial Assessment  Patient Complaints Anxiety;Suspiciousness  Eye Contact Glaring;Brief  Facial Expression Wide-eyed  Affect Anxious;Preoccupied  Speech Loud  Interaction Assertive  Motor Activity Slow  Appearance/Hygiene Disheveled  Behavior Characteristics Cooperative  Mood Labile  Thought Process  Coherency Disorganized  Content Delusions  Delusions Paranoid  Perception UTA  Hallucination None reported or observed  Judgment Poor  Confusion Moderate  Danger to Self  Current suicidal ideation? Denies  Agreement Not to Harm Self Yes  Description of Agreement VERBAL  Danger to Others  Danger to Others None reported or observed

## 2023-02-08 NOTE — Group Note (Signed)
Recreation Therapy Group Note   Group Topic:Coping Skills  Group Date: 02/08/2023 Start Time: 1010 End Time: 1105 Facilitators: Rosina Lowenstein, LRT, CTRS Location:  Craft Room  Group Description: Mind Map.  Patient was provided a blank template of a diagram with 32 blank boxes in a tiered system, branching from the center (similar to a bubble chart). LRT directed patients to label the middle of the diagram "Coping Skills". LRT and patients then came up with 8 different coping skills as examples. Pt were directed to record their coping skills in the 2nd tier boxes closest to the center.  Patients would then share their coping skills with the group as LRT wrote them out. LRT gave a handout of 99 different coping skills at the end of group.    Goal Area(s) Addressed: Patients will be able to define "coping skills". Patient will identify new coping skills.  Patient will increase communication.   Affect/Mood: N/A   Participation Level: Did not attend    Clinical Observations/Individualized Feedback: Sarajane did not attend group.  Plan: Continue to engage patient in RT group sessions 2-3x/week.   Rosina Lowenstein, LRT, CTRS 02/08/2023 11:40 AM

## 2023-02-08 NOTE — Progress Notes (Signed)
Patient presents with flat affect but cooperative on unit. Patient demonstrates adequate appetite and is med compliant. Patient denies SI,HI, and A/V/H with no plan or intent. Patient denies any pain or discomfort. No s/s of current distress.

## 2023-02-08 NOTE — Group Note (Signed)
Date:  02/08/2023 Time:  10:33 AM  Group Topic/Focus:  Crisis Planning:   The purpose of this group is to help patients create a crisis plan for use upon discharge or in the future, as needed.    Participation Level:  Did Not Attend   Janice Walls 02/08/2023, 10:33 AM

## 2023-02-08 NOTE — Group Note (Signed)
Date:  02/08/2023 Time:  5:17 PM  Group Topic/Focus:  Healthy Communication:   The focus of this group is to discuss communication, barriers to communication, as well as healthy ways to communicate with others. Outdoor recreation structured activity    Participation Level:  Active  Participation Quality:  Appropriate and Supportive  Affect:  Appropriate and Excited  Cognitive:  Alert and Appropriate  Insight: Improving  Engagement in Group:  Developing/Improving and Engaged  Modes of Intervention:  Activity, Discussion, Education, and Socialization  Additional Comments:    Rosaura Carpenter 02/08/2023, 5:17 PM

## 2023-02-09 DIAGNOSIS — F319 Bipolar disorder, unspecified: Secondary | ICD-10-CM | POA: Diagnosis not present

## 2023-02-09 NOTE — [facility] Counselor (Signed)
CSW has sent [facility] team referral.  Penni Homans, MSW, LCSW 02/09/2023 2:29 PM

## 2023-02-09 NOTE — Group Note (Signed)
Recreation Therapy Group Note   Group Topic:Leisure Education  Group Date: 02/09/2023 Start Time: 1000 End Time: 1100 Facilitators: Rosina Lowenstein, LRT, CTRS Location:  Craft Room  Group Description: Leisure. Patients were given the option to choose from singing karaoke, coloring mandalas, using oil pastels, journaling, or playing with play-doh. LRT and pts discussed the meaning of leisure, the importance of participating in leisure during their free time/when they're outside of the hospital, as well as how our leisure interests can also serve as coping skills.   Goal Area(s) Addressed:  Patient will identify a current leisure interest.  Patient will learn the definition of "leisure". Patient will practice making a positive decision. Patient will have the opportunity to try a new leisure activity. Patient will communicate with peers and LRT.    Affect/Mood: Appropriate   Participation Level: Active and Engaged   Participation Quality: Independent   Behavior: Bizarre   Speech/Thought Process: Coherent   Insight: Fair   Judgement: Fair    Modes of Intervention: Activity   Patient Response to Interventions:  Receptive   Education Outcome:  Acknowledges education   Clinical Observations/Individualized Feedback: Janice Walls was active in their participation of session activities and group discussion. Pt identified "go swimming and invite my friends to go swimming" as things she does in her free time. Pt chose to play with play doh and sing karaoke while in group. Pt was noted to be staring for long periods of time at the play doh and grinning at it. Pt interacted well with LRT and peers duration of session.   Plan: Continue to engage patient in RT group sessions 2-3x/week.   Rosina Lowenstein, LRT, CTRS 02/09/2023 11:44 AM

## 2023-02-09 NOTE — Progress Notes (Signed)
Patient was cooperative with treatment on the shift, she was visible in the milieu interacting wit peers. She was compliant with her medications on shift. No new behavior issues to report on shift at this time. She seemed to sleep well through out the night.

## 2023-02-09 NOTE — Plan of Care (Signed)
  Problem: Education: Goal: Knowledge of [facility] Education information/materials will improve Outcome: Progressing Goal: Mental status will improve Outcome: Progressing Goal: Verbalization of understanding the information provided will improve Outcome: Progressing   Problem: Health Behavior/Discharge Planning: Goal: Identification of resources available to assist in meeting health care needs will improve Outcome: Progressing Goal: Compliance with treatment plan for underlying cause of condition will improve Outcome: Progressing   Problem: Coping: Goal: Ability to identify and develop effective coping behavior will improve Outcome: Progressing Goal: Ability to interact with others will improve Outcome: Progressing Goal: Demonstration of participation in decision-making regarding own care will improve Outcome: Progressing Goal: Ability to use eye contact when communicating with others will improve Outcome: Progressing   Problem: Education: Goal: Knowledge of the prescribed therapeutic regimen will improve Outcome: Progressing   Problem: Health Behavior/Discharge Planning: Goal: Compliance with prescribed medication regimen will improve Outcome: Progressing   Problem: Role Relationship: Goal: Ability to communicate needs accurately will improve Outcome: Progressing   Problem: Safety: Goal: Ability to redirect hostility and anger into socially appropriate behaviors will improve Outcome: Progressing   Problem: Self-Care: Goal: Ability to participate in self-care as condition permits will improve Outcome: Progressing

## 2023-02-09 NOTE — Progress Notes (Signed)
D- Patient alert and oriented to person, situation, and place. Patient presented in a preoccupied, but pleasant mood on assessment stating that she slept "good" last night and had no complaints to voice to this Clinical research associate. Patient continues to be very disorganized and inconsistent with her thought content. When this writer asked patient if she was in any pain, she stated "no, I made it". Patient denied SI, HI, AVH, stating "push for tea". Patient also denied any signs/symptoms of depression and anxiety, stating that overall, she is feeling "good". Patient proceeds to ask this Clinical research associate "do you know anybody with too many teeth in their mouth? I have too many teeth in my mouth, one came in and fell out. So, I'm like what was the point?". Patient states that "it's mishaps, my body has mishaps and I don't want my teeth to turn yellow. Can you help me with that? I'm really nervous about that, so I'm just going to go to my room and lay down. I don't want to hurt nobody". Patient had no stated goals for today.  A- Scheduled medications administered to patient, per MD orders. Support and encouragement provided. Routine safety checks conducted every 15 minutes. Patient informed to notify staff with problems or concerns.  R- No adverse drug reactions noted. Patient contracts for safety at this time. Patient compliant with medications and treatment plan. Patient receptive, calm, and cooperative. Patient interacts well with others on the unit. Patient remains safe at this time.   02/09/23 0840  Psych Admission Type (Psych Patients Only)  Admission Status Involuntary  Psychosocial Assessment  Patient Complaints Depression;Nervousness;Disorientation  Copywriter, advertising;Watchful  Facial Expression Wide-eyed  Affect Blunted;Inconsistent with thought content;Preoccupied  Speech Pressured;Loud;Rapid  Interaction Assertive  Motor Activity Slow  Appearance/Hygiene Disheveled;Poor hygiene;In scrubs  Behavior Characteristics  Cooperative;Appropriate to situation  Mood Preoccupied;Pleasant  Aggressive Behavior  Effect No apparent injury  Thought Process  Coherency Disorganized;Flight of ideas;Loose associations  Content Preoccupation;Delusions  Delusions Paranoid  Perception Derealization;Hallucinations  Hallucination Auditory  Judgment Limited  Confusion Moderate  Danger to Self  Current suicidal ideation? Denies  Agreement Not to Harm Self Yes  Description of Agreement Verbal  Danger to Others  Danger to Others None reported or observed

## 2023-02-09 NOTE — Group Note (Signed)
Date:  02/09/2023 Time:  5:05 PM  Group Topic/Focus:  Activity Group:  The focus of the group is to promote physical activity outside in the courtyard to encourage some fresh air and some exercise to assist in their mental health treatment.    Participation Level:  Did Not Attend   Janice Walls 02/09/2023, 5:05 PM

## 2023-02-09 NOTE — Progress Notes (Signed)
[facility] MD Progress Note  02/09/2023 2:41 PM Kristian Hazzard  MRN:  [id number] Subjective: Janice Walls is seen on rounds.  She took her Abilify maintained up.  She does not really want to talk right now and states that she is tired.  She says a conversation with her mom did not go well.  I will talk with social work about her discharge plan. Principal Problem: Bipolar 1 disorder (HCC) Diagnosis: Principal Problem:   Bipolar 1 disorder (HCC)  Total Time spent with patient: 15 minutes  Past Psychiatric History: Bipolar 1 disorder  Past Medical History:  Past Medical History:  Diagnosis Date   Anxiety    Bipolar 1 disorder (HCC)    Depression    Insomnia    Substance abuse (HCC)     Past Surgical History:  Procedure Laterality Date   CESAREAN SECTION     CESAREAN SECTION     Family History:  Family History  Problem Relation Age of Onset   CAD Father 23   Family Psychiatric  History: Unremarkable Social History:  Social History   Substance and Sexual Activity  Alcohol Use Not Currently   Comment: rare     Social History   Substance and Sexual Activity  Drug Use Not Currently   Types: Marijuana, Methamphetamines    Social History   Socioeconomic History   Marital status: Single    Spouse name: Not on file   Number of children: Not on file   Years of education: Not on file   Highest education level: Not on file  Occupational History   Occupation: unemployed  Tobacco Use   Smoking status: Every Day    Current packs/day: 1.00    Average packs/day: 1 pack/day for 18.0 years (18.0 ttl pk-yrs)    Types: Cigarettes   Smokeless tobacco: Never  Vaping Use   Vaping status: Never Used  Substance and Sexual Activity   Alcohol use: Not Currently    Comment: rare   Drug use: Not Currently    Types: Marijuana, Methamphetamines   Sexual activity: Yes    Birth control/protection: None  Other Topics Concern   Not on file  Social History Narrative   Not on file   Social  Determinants of Health   Financial Resource Strain: Not on file  Food Insecurity: Patient Declined (01/30/2023)   Hunger Vital Sign    Worried About Running Out of Food in the Last Year: Patient declined    Ran Out of Food in the Last Year: Patient declined  Transportation Needs: No Transportation Needs (01/30/2023)   PRAPARE - Administrator, Civil Service (Medical): No    Lack of Transportation (Non-Medical): No  Physical Activity: Not on file  Stress: Not on file  Social Connections: Not on file   Additional Social History:                         Sleep: Good  Appetite:  Good  Current Medications: Current Facility-Administered Medications  Medication Dose Route Frequency Provider Last Rate Last Admin   acetaminophen (TYLENOL) tablet 650 mg  650 mg Oral Q6H PRN Dixon, Rashaun M, NP       alum & mag hydroxide-simeth (MAALOX/MYLANTA) 200-200-20 MG/5ML suspension 30 mL  30 mL Oral Q4H PRN Dixon, Rashaun M, NP       ARIPiprazole (ABILIFY) tablet 10 mg  10 mg Oral Daily Lewanda Rife, MD   10 mg at 02/09/23 0840   ARIPiprazole  ER (ABILIFY MAINTENA) injection 400 mg  400 mg Intramuscular Q28 days Sarina Ill, DO   400 mg at 02/07/23 1304   diphenhydrAMINE (BENADRYL) capsule 50 mg  50 mg Oral TID PRN Jearld Lesch, NP       Or   diphenhydrAMINE (BENADRYL) injection 50 mg  50 mg Intramuscular TID PRN Jearld Lesch, NP       haloperidol (HALDOL) tablet 5 mg  5 mg Oral TID PRN Jearld Lesch, NP       Or   haloperidol lactate (HALDOL) injection 5 mg  5 mg Intramuscular TID PRN Jearld Lesch, NP       hydrOXYzine (ATARAX) tablet 25 mg  25 mg Oral TID PRN Jearld Lesch, NP   25 mg at 02/07/23 2107   influenza vac split trivalent PF (FLULAVAL) injection 0.5 mL  0.5 mL Intramuscular Tomorrow-1000 Lewanda Rife, MD       lithium carbonate (ESKALITH) ER tablet 450 mg  450 mg Oral BID Sarina Ill, DO   450 mg at 02/09/23 0840    LORazepam (ATIVAN) tablet 2 mg  2 mg Oral TID PRN Jearld Lesch, NP       Or   LORazepam (ATIVAN) injection 2 mg  2 mg Intramuscular TID PRN Jearld Lesch, NP       magnesium hydroxide (MILK OF MAGNESIA) suspension 30 mL  30 mL Oral Daily PRN Dixon, Elray Buba, NP       nicotine (NICODERM CQ - dosed in mg/24 hours) patch 14 mg  14 mg Transdermal Daily Lewanda Rife, MD   14 mg at 02/02/23 0835   QUEtiapine (SEROQUEL) tablet 100 mg  100 mg Oral QHS Sarina Ill, DO   100 mg at 02/08/23 2114   traZODone (DESYREL) tablet 50 mg  50 mg Oral QHS PRN Jearld Lesch, NP   50 mg at 02/08/23 2114    Lab Results: No results found for this or any previous visit (from the past 48 hour(s)).  Blood Alcohol level:  Lab Results  Component Value Date   ETH <10 01/29/2023   ETH <10 08/04/2022    Metabolic Disorder Labs: Lab Results  Component Value Date   HGBA1C 5.2 08/10/2022   MPG 103 08/10/2022   MPG 91 09/12/2016   Lab Results  Component Value Date   PROLACTIN 37.4 (H) 09/12/2016   Lab Results  Component Value Date   CHOL 142 08/10/2022   TRIG 79 08/10/2022   HDL 28 (L) 08/10/2022   CHOLHDL 5.1 08/10/2022   VLDL 16 08/10/2022   LDLCALC 98 08/10/2022   LDLCALC 73 09/12/2016    Physical Findings: AIMS:  , ,  ,  ,    CIWA:    COWS:     Musculoskeletal: Strength & Muscle Tone: within normal limits Gait & Station: normal Patient leans: N/A  Psychiatric Specialty Exam:  Presentation  General Appearance:  Disheveled  Eye Contact: Minimal  Speech: Garbled  Speech Volume: Decreased  Handedness: Right   Mood and Affect  Mood: Anxious; Depressed  Affect: Blunt   Thought Process  Thought Processes: Disorganized  Descriptions of Associations:Tangential  Orientation:Full (Time, Place and Person)  Thought Content:Perseveration; Rumination  History of Schizophrenia/Schizoaffective disorder:No  Duration of Psychotic  Symptoms:N/A  Hallucinations:No data recorded Ideas of Reference:None  Suicidal Thoughts:No data recorded Homicidal Thoughts:No data recorded  Sensorium  Memory: Other (comment) (Impaired)  Judgment: Poor  Insight: Poor   Art therapist  Concentration: Poor  Attention Span: Poor  Recall: Poor  Fund of Knowledge: Poor  Language: Fair   Psychomotor Activity  Psychomotor Activity:No data recorded  Assets  Assets: Communication Skills; Desire for Improvement; Housing   Sleep  Sleep:No data recorded    Blood pressure 106/64, pulse 77, temperature 97.8 F (36.6 C), temperature source Oral, resp. rate 17, height 5\' 2"  (1.575 m), weight 99.8 kg, SpO2 99%. Body mass index is 40.24 kg/m.   Treatment Plan Summary: Daily contact with patient to assess and evaluate symptoms and progress in treatment, Medication management, and Plan continue current medication.  Sarina Ill, DO 02/09/2023, 2:41 PM

## 2023-02-09 NOTE — BHH Counselor (Signed)
CSW spoke with the patient's mother/legal guardian.  She is requesting an appointment with [facility] and a referral to [facility].  Penni Homans, MSW, LCSW 02/09/2023 2:11 PM

## 2023-02-09 NOTE — BHH Counselor (Signed)
CSW attempted to call the patient's guardian to discuss aftercare plans.   CSW left HIPAA compliant voicemail.  Penni Homans, MSW, LCSW 02/09/2023 1:58 PM

## 2023-02-09 NOTE — Group Note (Signed)
Date:  02/09/2023 Time:  9:17 PM  Group Topic/Focus:  Recovery Goals:   The focus of this group is to identify appropriate goals for recovery and establish a plan to achieve them.    Participation Level:  Did Not Attend    Janice Walls 02/09/2023, 9:17 PM

## 2023-02-09 NOTE — Group Note (Signed)
Date:  02/09/2023 Time:  10:22 AM  Group Topic/Focus:  Goals Group:   The focus of this group is to help patients establish daily goals to achieve during treatment and discuss how the patient can incorporate goal setting into their daily lives to aide in recovery.    Participation Level:  Active  Participation Quality:  Appropriate  Affect:  Appropriate  Cognitive:  Appropriate  Insight: Appropriate  Engagement in Group:  Engaged  Modes of Intervention:  Activity  Additional Comments:    Janice Walls 02/09/2023, 10:22 AM

## 2023-02-10 DIAGNOSIS — F319 Bipolar disorder, unspecified: Secondary | ICD-10-CM | POA: Diagnosis not present

## 2023-02-10 NOTE — Group Note (Signed)
Date:  02/10/2023 Time:  4:11 PM  Group Topic/Focus:  Outdoor recreation structured activity. The focus of the group is to promote physical activity outside in the courtyard to encourage some fresh air and some exercise to assist in their mental health treatment.    Participation Level:  Did Not Attend   Rosaura Carpenter 02/10/2023, 4:11 PM

## 2023-02-10 NOTE — Plan of Care (Signed)
  Problem: Education: Goal: Knowledge of [place] [facility] Education information/materials will improve Outcome: Progressing Goal: Mental status will improve Outcome: Progressing Goal: Verbalization of understanding the information provided will improve Outcome: Progressing   Problem: Health Behavior/Discharge Planning: Goal: Identification of resources available to assist in meeting health care needs will improve Outcome: Progressing Goal: Compliance with treatment plan for underlying cause of condition will improve Outcome: Progressing   Problem: Coping: Goal: Ability to identify and develop effective coping behavior will improve Outcome: Progressing Goal: Ability to interact with others will improve Outcome: Progressing Goal: Demonstration of participation in decision-making regarding own care will improve Outcome: Progressing Goal: Ability to use eye contact when communicating with others will improve Outcome: Progressing   Problem: Education: Goal: Knowledge of the prescribed therapeutic regimen will improve Outcome: Progressing   Problem: Health Behavior/Discharge Planning: Goal: Compliance with prescribed medication regimen will improve Outcome: Progressing   Problem: Role Relationship: Goal: Ability to communicate needs accurately will improve Outcome: Progressing   Problem: Safety: Goal: Ability to redirect hostility and anger into socially appropriate behaviors will improve Outcome: Progressing   Problem: Self-Care: Goal: Ability to participate in self-care as condition permits will improve Outcome: Progressing

## 2023-02-10 NOTE — BH IP Treatment Plan (Signed)
Interdisciplinary Treatment and Diagnostic Plan Update  02/10/2023 Time of Session: 3:50 pm Kashlynn Kundert MRN: [id number]  Principal Diagnosis: Bipolar 1 disorder (HCC)  Secondary Diagnoses: Principal Problem:   Bipolar 1 disorder (HCC)   Current Medications:  Current Facility-Administered Medications  Medication Dose Route Frequency Provider Last Rate Last Admin   acetaminophen (TYLENOL) tablet 650 mg  650 mg Oral Q6H PRN Jearld Lesch, NP       alum & mag hydroxide-simeth (MAALOX/MYLANTA) 200-200-20 MG/5ML suspension 30 mL  30 mL Oral Q4H PRN Dixon, Rashaun M, NP       ARIPiprazole (ABILIFY) tablet 10 mg  10 mg Oral Daily Lewanda Rife, MD   10 mg at 02/10/23 0851   ARIPiprazole ER (ABILIFY MAINTENA) injection 400 mg  400 mg Intramuscular Q28 days Sarina Ill, DO   400 mg at 02/07/23 1304   diphenhydrAMINE (BENADRYL) capsule 50 mg  50 mg Oral TID PRN Jearld Lesch, NP       Or   diphenhydrAMINE (BENADRYL) injection 50 mg  50 mg Intramuscular TID PRN Jearld Lesch, NP       haloperidol (HALDOL) tablet 5 mg  5 mg Oral TID PRN Jearld Lesch, NP       Or   haloperidol lactate (HALDOL) injection 5 mg  5 mg Intramuscular TID PRN Jearld Lesch, NP       hydrOXYzine (ATARAX) tablet 25 mg  25 mg Oral TID PRN Jearld Lesch, NP   25 mg at 02/07/23 2107   influenza vac split trivalent PF (FLULAVAL) injection 0.5 mL  0.5 mL Intramuscular Tomorrow-1000 Lewanda Rife, MD       lithium carbonate (ESKALITH) ER tablet 450 mg  450 mg Oral BID Sarina Ill, DO   450 mg at 02/10/23 0851   LORazepam (ATIVAN) tablet 2 mg  2 mg Oral TID PRN Jearld Lesch, NP       Or   LORazepam (ATIVAN) injection 2 mg  2 mg Intramuscular TID PRN Jearld Lesch, NP       magnesium hydroxide (MILK OF MAGNESIA) suspension 30 mL  30 mL Oral Daily PRN Dixon, Rashaun M, NP       nicotine (NICODERM CQ - dosed in mg/24 hours) patch 14 mg  14 mg Transdermal Daily Lewanda Rife, MD   14 mg at 02/02/23 0835   QUEtiapine (SEROQUEL) tablet 100 mg  100 mg Oral QHS Sarina Ill, DO   100 mg at 02/09/23 2148   traZODone (DESYREL) tablet 50 mg  50 mg Oral QHS PRN Jearld Lesch, NP   50 mg at 02/09/23 2148   PTA Medications: Medications Prior to Admission  Medication Sig Dispense Refill Last Dose   ARIPiprazole (ABILIFY) 20 MG tablet Take 1 tablet (20 mg total) by mouth daily. (Patient not taking: Reported on 01/29/2023) 10 tablet 0    ARIPiprazole ER (ABILIFY MAINTENA) 400 MG SRER injection Inject 2 mLs (400 mg total) into the muscle every 28 (twenty-eight) days. (Patient not taking: Reported on 01/29/2023) 1 each 1    INGREZZA 80 MG capsule Take 1 capsule (80 mg total) by mouth daily. (Patient not taking: Reported on 01/29/2023) 30 capsule 1    lithium carbonate (LITHOBID) 300 MG ER tablet Take 1 tablet (300 mg total) by mouth every 12 (twelve) hours. (Patient not taking: Reported on 01/29/2023) 20 tablet 0    metoprolol succinate (TOPROL-XL) 50 MG 24 hr tablet Take 1 tablet (50  mg total) by mouth daily. (Patient not taking: Reported on 01/29/2023) 10 tablet 0    nicotine (NICODERM CQ - DOSED IN MG/24 HOURS) 21 mg/24hr patch Place 1 patch (21 mg total) onto the skin daily. (Patient not taking: Reported on 01/29/2023) 14 patch 0    nicotine polacrilex (NICORETTE) 2 MG gum Take 1 each (2 mg total) by mouth as needed for smoking cessation. (Patient not taking: Reported on 01/29/2023) 50 tablet 0    topiramate (TOPAMAX) 25 MG tablet Take 3 tablets (75 mg total) by mouth daily. (Patient not taking: Reported on 01/29/2023) 30 tablet 0     Patient Stressors: Medication change or noncompliance   Traumatic event    Patient Strengths: Manufacturing systems engineer  Physical Health  Supportive family/friends   Treatment Modalities: Medication Management, Group therapy, Case management,  1 to 1 session with clinician, Psychoeducation, Recreational therapy.   Physician  Treatment Plan for Primary Diagnosis: Bipolar 1 disorder (HCC) Long Term Goal(s): Improvement in symptoms so as ready for discharge   Short Term Goals: Ability to identify changes in lifestyle to reduce recurrence of condition will improve Ability to verbalize feelings will improve Ability to disclose and discuss suicidal ideas Ability to demonstrate self-control will improve Ability to identify and develop effective coping behaviors will improve Ability to maintain clinical measurements within normal limits will improve Compliance with prescribed medications will improve Ability to identify triggers associated with substance abuse/mental health issues will improve  Medication Management: Evaluate patient's response, side effects, and tolerance of medication regimen.  Therapeutic Interventions: 1 to 1 sessions, Unit Group sessions and Medication administration.  Evaluation of Outcomes: Progressing  Physician Treatment Plan for Secondary Diagnosis: Principal Problem:   Bipolar 1 disorder (HCC)  Long Term Goal(s): Improvement in symptoms so as ready for discharge   Short Term Goals: Ability to identify changes in lifestyle to reduce recurrence of condition will improve Ability to verbalize feelings will improve Ability to disclose and discuss suicidal ideas Ability to demonstrate self-control will improve Ability to identify and develop effective coping behaviors will improve Ability to maintain clinical measurements within normal limits will improve Compliance with prescribed medications will improve Ability to identify triggers associated with substance abuse/mental health issues will improve     Medication Management: Evaluate patient's response, side effects, and tolerance of medication regimen.  Therapeutic Interventions: 1 to 1 sessions, Unit Group sessions and Medication administration.  Evaluation of Outcomes: Progressing   RN Treatment Plan for Primary Diagnosis: Bipolar  1 disorder (HCC) Long Term Goal(s): Knowledge of disease and therapeutic regimen to maintain health will improve  Short Term Goals: Ability to remain free from injury will improve, Ability to verbalize frustration and anger appropriately will improve, Ability to demonstrate self-control, Ability to participate in decision making will improve, Ability to verbalize feelings will improve, Ability to disclose and discuss suicidal ideas, Ability to identify and develop effective coping behaviors will improve, and Compliance with prescribed medications will improve  Medication Management: RN will administer medications as ordered by provider, will assess and evaluate patient's response and provide education to patient for prescribed medication. RN will report any adverse and/or side effects to prescribing provider.  Therapeutic Interventions: 1 on 1 counseling sessions, Psychoeducation, Medication administration, Evaluate responses to treatment, Monitor vital signs and CBGs as ordered, Perform/monitor CIWA, COWS, AIMS and Fall Risk screenings as ordered, Perform wound care treatments as ordered.  Evaluation of Outcomes: Progressing   LCSW Treatment Plan for Primary Diagnosis: Bipolar 1 disorder (HCC) Long Term  Goal(s): Safe transition to appropriate next level of care at discharge, Engage patient in therapeutic group addressing interpersonal concerns.  Short Term Goals: Engage patient in aftercare planning with referrals and resources, Increase social support, Increase ability to appropriately verbalize feelings, Increase emotional regulation, Facilitate acceptance of mental health diagnosis and concerns, Facilitate patient progression through stages of change regarding substance use diagnoses and concerns, Identify triggers associated with mental health/substance abuse issues, and Increase skills for wellness and recovery  Therapeutic Interventions: Assess for all discharge needs, 1 to 1 time with Social  worker, Explore available resources and support systems, Assess for adequacy in community support network, Educate family and significant other(s) on suicide prevention, Complete Psychosocial Assessment, Interpersonal group therapy.  Evaluation of Outcomes: Progressing   Progress in Treatment: Attending groups: Yes. and No. Participating in groups: Yes. and No. Taking medication as prescribed: Yes. Toleration medication: Yes. Family/Significant other contact made: Odessa Fleming, guardian, [phone]  Patient understands diagnosis: Yes. Discussing patient identified problems/goals with staff: Yes. Medical problems stabilized or resolved: Yes. Denies suicidal/homicidal ideation: Yes. Issues/concerns per patient self-inventory: No. Other: none  New problem(s) identified: No, Describe:  none  New Short Term/Long Term Goal(s): detox, elimination of symptoms of psychosis, medication management for mood stabilization; elimination of SI thoughts; development of comprehensive mental wellness/sobriety plan. Update 02/05/23: No changes at this time Update 02/10/23: None at this time.    Patient Goals:  Patient too disorganized to provide goal at this time.  Patient was fixated on discussing her name, "Dahiana Kulak Hitler Sahlin" and how Janus Molder means invisible.  Update 02/05/23: No changes at this time  Update 02/10/23: None at this time.    Discharge Plan or Barriers: CSW to assist to patient in development of appropriate discharge plans.  Review of patient's chart shows a letter from the patients mother stating that patient is in need of long-term care.  Mother is also patient's legal guardian.  Update 02/05/23: Pt continues to be delusional about being pregnant, CSW continues working on appropriate discharge plan  Update 02/10/23: The patient will need safe transport because she like to jump out.    Reason for Continuation of Hospitalization: Anxiety Delusions   Depression Hallucinations Medication stabilization Suicidal ideation   Estimated Length of Stay:  1-7 days Update 02/05/23: TBD Update 02/10/23: expected discharge date is 02/12/23  Last 3 [place] Suicide Severity Risk Score: Flowsheet Row Admission (Current) from 01/30/2023 in [facility]PATIENT [facility] ED from 01/29/2023 in [facility] Department at [facility] Admission (Discharged) from 08/05/2022 in [facility]PAT[facility] MED[facility]INE  C-SSRS RISK CATEGORY No Risk No Risk No Risk       Last PHQ 2/9 Scores:     No data to display          Scribe for Treatment Team: Marshell Levan, LCSW 02/10/2023 3:53 PM

## 2023-02-10 NOTE — Group Note (Signed)
Date:  02/10/2023 Time:  4:17 PM  Group Topic/Focus:  Primary and Secondary Emotions:   The focus of this group is to discuss the difference between primary and secondary emotions.    Participation Level:  Did Not Attend   Janice Walls 02/10/2023, 4:17 PM

## 2023-02-10 NOTE — Progress Notes (Signed)
Patient was cooperative with treatment and pleasant on the shift on the shift, she was visible in the milieu interacting with  peers. She was compliant with her medications on shift. She was observed in the hallway and in her room staring strangely.She seemed to sleep well through out the night.

## 2023-02-10 NOTE — Plan of Care (Signed)
  Problem: Education: Goal: Knowledge of [place] [facility] Education information/materials will improve Outcome: Not Progressing Goal: Mental status will improve Outcome: Not Progressing Goal: Verbalization of understanding the information provided will improve Outcome: Not Progressing   Problem: Health Behavior/Discharge Planning: Goal: Identification of resources available to assist in meeting health care needs will improve Outcome: Not Progressing Goal: Compliance with treatment plan for underlying cause of condition will improve Outcome: Not Progressing   Problem: Coping: Goal: Ability to identify and develop effective coping behavior will improve Outcome: Not Progressing Goal: Ability to interact with others will improve Outcome: Not Progressing Goal: Demonstration of participation in decision-making regarding own care will improve Outcome: Not Progressing Goal: Ability to use eye contact when communicating with others will improve Outcome: Not Progressing   Problem: Education: Goal: Knowledge of the prescribed therapeutic regimen will improve Outcome: Not Progressing   Problem: Health Behavior/Discharge Planning: Goal: Compliance with prescribed medication regimen will improve Outcome: Not Progressing   Problem: Role Relationship: Goal: Ability to communicate needs accurately will improve Outcome: Not Progressing   Problem: Safety: Goal: Ability to redirect hostility and anger into socially appropriate behaviors will improve Outcome: Not Progressing   Problem: Self-Care: Goal: Ability to participate in self-care as condition permits will improve Outcome: Not Progressing

## 2023-02-10 NOTE — Progress Notes (Signed)
D- Patient alert and oriented x 1-2. Affect bizarre, blank and wide eyed/mood congruent. Denies SI/ HI/ AVH. Noticed staring off preoccupied with disorganized speech. Patient denies pain. Patient denies depression and anxiety. She refuses to fill out Patient Self Inventory Sheet. No complaints. A- Scheduled medications administered to patient, per MD orders. Support and encouragement provided.  Routine safety checks conducted every 15 minutes without incident.  Patient informed to notify staff with problems or concerns and verbalizes understanding. R- No adverse drug reactions noted.  Patient compliant with medications and treatment plan. Patient calm and redirectable. She isolates to her room except for meals. Patient contracts for safety and  remains safe on the unit at this time.

## 2023-02-10 NOTE — Progress Notes (Signed)
[facility] MD Progress Note  02/10/2023 1:18 PM Janice Walls  MRN:  [id number] Subjective: Janice Walls is seen on rounds.  She is doing pretty well.  Nurses report no issues. Principal Problem: Bipolar 1 disorder (HCC) Diagnosis: Principal Problem:   Bipolar 1 disorder (HCC)  Total Time spent with patient: 15 minutes  Past Psychiatric History: Bipolar disorder  Past Medical History:  Past Medical History:  Diagnosis Date   Anxiety    Bipolar 1 disorder (HCC)    Depression    Insomnia    Substance abuse (HCC)     Past Surgical History:  Procedure Laterality Date   CESAREAN SECTION     CESAREAN SECTION     Family History:  Family History  Problem Relation Age of Onset   CAD Father 55   Family Psychiatric  History: Unremarkable Social History:  Social History   Substance and Sexual Activity  Alcohol Use Not Currently   Comment: rare     Social History   Substance and Sexual Activity  Drug Use Not Currently   Types: Marijuana, Methamphetamines    Social History   Socioeconomic History   Marital status: Single    Spouse name: Not on file   Number of children: Not on file   Years of education: Not on file   Highest education level: Not on file  Occupational History   Occupation: unemployed  Tobacco Use   Smoking status: Every Day    Current packs/day: 1.00    Average packs/day: 1 pack/day for 18.0 years (18.0 ttl pk-yrs)    Types: Cigarettes   Smokeless tobacco: Never  Vaping Use   Vaping status: Never Used  Substance and Sexual Activity   Alcohol use: Not Currently    Comment: rare   Drug use: Not Currently    Types: Marijuana, Methamphetamines   Sexual activity: Yes    Birth control/protection: None  Other Topics Concern   Not on file  Social History Narrative   Not on file   Social Determinants of Health   Financial Resource Strain: Not on file  Food Insecurity: Patient Declined (01/30/2023)   Hunger Vital Sign    Worried About Running Out of  Food in the Last Year: Patient declined    Ran Out of Food in the Last Year: Patient declined  Transportation Needs: No Transportation Needs (01/30/2023)   PRAPARE - Administrator, Civil Service (Medical): No    Lack of Transportation (Non-Medical): No  Physical Activity: Not on file  Stress: Not on file  Social Connections: Not on file   Additional Social History:                         Sleep: Good  Appetite:  Good  Current Medications: Current Facility-Administered Medications  Medication Dose Route Frequency Provider Last Rate Last Admin   acetaminophen (TYLENOL) tablet 650 mg  650 mg Oral Q6H PRN Dixon, Rashaun M, NP       alum & mag hydroxide-simeth (MAALOX/MYLANTA) 200-200-20 MG/5ML suspension 30 mL  30 mL Oral Q4H PRN Dixon, Rashaun M, NP       ARIPiprazole (ABILIFY) tablet 10 mg  10 mg Oral Daily Lewanda Rife, MD   10 mg at 02/10/23 0851   ARIPiprazole ER (ABILIFY MAINTENA) injection 400 mg  400 mg Intramuscular Q28 days Sarina Ill, DO   400 mg at 02/07/23 1304   diphenhydrAMINE (BENADRYL) capsule 50 mg  50 mg Oral TID PRN  Jearld Lesch, NP       Or   diphenhydrAMINE (BENADRYL) injection 50 mg  50 mg Intramuscular TID PRN Durwin Nora, Elray Buba, NP       haloperidol (HALDOL) tablet 5 mg  5 mg Oral TID PRN Jearld Lesch, NP       Or   haloperidol lactate (HALDOL) injection 5 mg  5 mg Intramuscular TID PRN Jearld Lesch, NP       hydrOXYzine (ATARAX) tablet 25 mg  25 mg Oral TID PRN Jearld Lesch, NP   25 mg at 02/07/23 2107   influenza vac split trivalent PF (FLULAVAL) injection 0.5 mL  0.5 mL Intramuscular Tomorrow-1000 Lewanda Rife, MD       lithium carbonate (ESKALITH) ER tablet 450 mg  450 mg Oral BID Sarina Ill, DO   450 mg at 02/10/23 1610   LORazepam (ATIVAN) tablet 2 mg  2 mg Oral TID PRN Jearld Lesch, NP       Or   LORazepam (ATIVAN) injection 2 mg  2 mg Intramuscular TID PRN Jearld Lesch, NP        magnesium hydroxide (MILK OF MAGNESIA) suspension 30 mL  30 mL Oral Daily PRN Dixon, Elray Buba, NP       nicotine (NICODERM CQ - dosed in mg/24 hours) patch 14 mg  14 mg Transdermal Daily Lewanda Rife, MD   14 mg at 02/02/23 0835   QUEtiapine (SEROQUEL) tablet 100 mg  100 mg Oral QHS Sarina Ill, DO   100 mg at 02/09/23 2148   traZODone (DESYREL) tablet 50 mg  50 mg Oral QHS PRN Jearld Lesch, NP   50 mg at 02/09/23 2148    Lab Results: No results found for this or any previous visit (from the past 48 hour(s)).  Blood Alcohol level:  Lab Results  Component Value Date   ETH <10 01/29/2023   ETH <10 08/04/2022    Metabolic Disorder Labs: Lab Results  Component Value Date   HGBA1C 5.2 08/10/2022   MPG 103 08/10/2022   MPG 91 09/12/2016   Lab Results  Component Value Date   PROLACTIN 37.4 (H) 09/12/2016   Lab Results  Component Value Date   CHOL 142 08/10/2022   TRIG 79 08/10/2022   HDL 28 (L) 08/10/2022   CHOLHDL 5.1 08/10/2022   VLDL 16 08/10/2022   LDLCALC 98 08/10/2022   LDLCALC 73 09/12/2016    Physical Findings: AIMS:  , ,  ,  ,    CIWA:    COWS:     Musculoskeletal: Strength & Muscle Tone: within normal limits Gait & Station: normal Patient leans: N/A  Psychiatric Specialty Exam:  Presentation  General Appearance:  Disheveled  Eye Contact: Minimal  Speech: Garbled  Speech Volume: Decreased  Handedness: Right   Mood and Affect  Mood: Anxious; Depressed  Affect: Blunt   Thought Process  Thought Processes: Disorganized  Descriptions of Associations:Tangential  Orientation:Full (Time, Place and Person)  Thought Content:Perseveration; Rumination  History of Schizophrenia/Schizoaffective disorder:No  Duration of Psychotic Symptoms:N/A  Hallucinations:No data recorded Ideas of Reference:None  Suicidal Thoughts:No data recorded Homicidal Thoughts:No data recorded  Sensorium  Memory: Other (comment)  (Impaired)  Judgment: Poor  Insight: Poor   Executive Functions  Concentration: Poor  Attention Span: Poor  Recall: Poor  Fund of Knowledge: Poor  Language: Fair   Psychomotor Activity  Psychomotor Activity:No data recorded  Assets  Assets: Communication Skills; Desire for Improvement; Housing  Sleep  Sleep:No data recorded    Blood pressure 130/82, pulse 75, temperature 98 F (36.7 C), resp. rate 17, height 5\' 2"  (1.575 m), weight 99.8 kg, SpO2 98%. Body mass index is 40.24 kg/m.   Treatment Plan Summary: Daily contact with patient to assess and evaluate symptoms and progress in treatment, Medication management, and Plan continue current medications.  Sarina Ill, DO 02/10/2023, 1:18 PM

## 2023-02-10 NOTE — Group Note (Signed)
Date:  02/10/2023 Time:  6:31 PM  Group Topic/Focus:  he focus of the group is to promote physical activity outside in the courtyard to encourage some fresh air and some exercise to assist in their mental health treatment.    Participation Level:  Active  Participation Quality:  Appropriate  Affect:  Appropriate  Cognitive:  Alert and Appropriate  Insight: Appropriate  Engagement in Group:  Developing/Improving and Engaged  Modes of Intervention:  Activity  Additional Comments:    Norton Bivins 02/10/2023, 6:31 PM

## 2023-02-11 DIAGNOSIS — F319 Bipolar disorder, unspecified: Secondary | ICD-10-CM | POA: Diagnosis not present

## 2023-02-11 NOTE — Group Note (Signed)
Date:  02/11/2023 Time:  6:02 PM  Group Topic/Focus:  Healthy Communication:   The focus of this group is to discuss communication, barriers to communication, as well as healthy ways to communicate with others.    Participation Level:  Did Not Attend   Lynelle Smoke [facility] 02/11/2023, 6:02 PM

## 2023-02-11 NOTE — Progress Notes (Signed)
[facility] MD Progress Note  02/11/2023 12:52 PM Janice Walls  MRN:  [id number] Subjective:  Janice Walls seen on rounds.  She has been compliant with medications.  No side effects.  She has fixed delusions about being pregnant.  She has been in good controls.  She has about when she is leaving.  I told her she had a guardian which is her mom and she said that is not true.  Nurses state that mom is the guardian.  I told her I would talk to social work about discharge plans.  She did take the Abilify maintained last week. Principal Problem: Bipolar 1 disorder (HCC) Diagnosis: Principal Problem:   Bipolar 1 disorder (HCC)  Total Time spent with patient: 15 minutes  Past Psychiatric History: Bipolar disorder.  To be honest, I think she is more schizoaffective.  Past Medical History:  Past Medical History:  Diagnosis Date   Anxiety    Bipolar 1 disorder (HCC)    Depression    Insomnia    Substance abuse (HCC)     Past Surgical History:  Procedure Laterality Date   CESAREAN SECTION     CESAREAN SECTION     Family History:  Family History  Problem Relation Age of Onset   CAD Father 79   Family Psychiatric  History: Unremarkable Social History:  Social History   Substance and Sexual Activity  Alcohol Use Not Currently   Comment: rare     Social History   Substance and Sexual Activity  Drug Use Not Currently   Types: Marijuana, Methamphetamines    Social History   Socioeconomic History   Marital status: Single    Spouse name: Not on file   Number of children: Not on file   Years of education: Not on file   Highest education level: Not on file  Occupational History   Occupation: unemployed  Tobacco Use   Smoking status: Every Day    Current packs/day: 1.00    Average packs/day: 1 pack/day for 18.0 years (18.0 ttl pk-yrs)    Types: Cigarettes   Smokeless tobacco: Never  Vaping Use   Vaping status: Never Used  Substance and Sexual Activity   Alcohol use: Not Currently     Comment: rare   Drug use: Not Currently    Types: Marijuana, Methamphetamines   Sexual activity: Yes    Birth control/protection: None  Other Topics Concern   Not on file  Social History Narrative   Not on file   Social Determinants of Health   Financial Resource Strain: Not on file  Food Insecurity: Patient Declined (01/30/2023)   Hunger Vital Sign    Worried About Running Out of Food in the Last Year: Patient declined    Ran Out of Food in the Last Year: Patient declined  Transportation Needs: No Transportation Needs (01/30/2023)   PRAPARE - Administrator, Civil Service (Medical): No    Lack of Transportation (Non-Medical): No  Physical Activity: Not on file  Stress: Not on file  Social Connections: Not on file   Additional Social History:                         Sleep: Good  Appetite:  Good  Current Medications: Current Facility-Administered Medications  Medication Dose Route Frequency Provider Last Rate Last Admin   acetaminophen (TYLENOL) tablet 650 mg  650 mg Oral Q6H PRN Dixon, Elray Buba, NP       alum & mag  hydroxide-simeth (MAALOX/MYLANTA) 200-200-20 MG/5ML suspension 30 mL  30 mL Oral Q4H PRN Durwin Nora, Rashaun M, NP       ARIPiprazole (ABILIFY) tablet 10 mg  10 mg Oral Daily Lewanda Rife, MD   10 mg at 02/11/23 0858   ARIPiprazole ER (ABILIFY MAINTENA) injection 400 mg  400 mg Intramuscular Q28 days Sarina Ill, DO   400 mg at 02/07/23 1304   diphenhydrAMINE (BENADRYL) capsule 50 mg  50 mg Oral TID PRN Jearld Lesch, NP       Or   diphenhydrAMINE (BENADRYL) injection 50 mg  50 mg Intramuscular TID PRN Jearld Lesch, NP       haloperidol (HALDOL) tablet 5 mg  5 mg Oral TID PRN Jearld Lesch, NP       Or   haloperidol lactate (HALDOL) injection 5 mg  5 mg Intramuscular TID PRN Jearld Lesch, NP       hydrOXYzine (ATARAX) tablet 25 mg  25 mg Oral TID PRN Jearld Lesch, NP   25 mg at 02/07/23 2107   influenza vac  split trivalent PF (FLULAVAL) injection 0.5 mL  0.5 mL Intramuscular Tomorrow-1000 Lewanda Rife, MD       lithium carbonate (ESKALITH) ER tablet 450 mg  450 mg Oral BID Sarina Ill, DO   450 mg at 02/11/23 0858   LORazepam (ATIVAN) tablet 2 mg  2 mg Oral TID PRN Jearld Lesch, NP       Or   LORazepam (ATIVAN) injection 2 mg  2 mg Intramuscular TID PRN Jearld Lesch, NP       magnesium hydroxide (MILK OF MAGNESIA) suspension 30 mL  30 mL Oral Daily PRN Dixon, Elray Buba, NP       nicotine (NICODERM CQ - dosed in mg/24 hours) patch 14 mg  14 mg Transdermal Daily Lewanda Rife, MD   14 mg at 02/02/23 0835   QUEtiapine (SEROQUEL) tablet 100 mg  100 mg Oral QHS Sarina Ill, DO   100 mg at 02/10/23 2146   traZODone (DESYREL) tablet 50 mg  50 mg Oral QHS PRN Jearld Lesch, NP   50 mg at 02/09/23 2148    Lab Results: No results found for this or any previous visit (from the past 48 hour(s)).  Blood Alcohol level:  Lab Results  Component Value Date   ETH <10 01/29/2023   ETH <10 08/04/2022    Metabolic Disorder Labs: Lab Results  Component Value Date   HGBA1C 5.2 08/10/2022   MPG 103 08/10/2022   MPG 91 09/12/2016   Lab Results  Component Value Date   PROLACTIN 37.4 (H) 09/12/2016   Lab Results  Component Value Date   CHOL 142 08/10/2022   TRIG 79 08/10/2022   HDL 28 (L) 08/10/2022   CHOLHDL 5.1 08/10/2022   VLDL 16 08/10/2022   LDLCALC 98 08/10/2022   LDLCALC 73 09/12/2016    Physical Findings: AIMS:  , ,  ,  ,    CIWA:    COWS:     Musculoskeletal: Strength & Muscle Tone: within normal limits Gait & Station: normal Patient leans: N/A  Psychiatric Specialty Exam:  Presentation  General Appearance:  Disheveled  Eye Contact: Minimal  Speech: Garbled  Speech Volume: Decreased  Handedness: Right   Mood and Affect  Mood: Anxious; Depressed  Affect: Blunt   Thought Process  Thought  Processes: Disorganized  Descriptions of Associations:Tangential  Orientation:Full (Time, Place and Person)  Thought  Content:Perseveration; Rumination  History of Schizophrenia/Schizoaffective disorder:No  Duration of Psychotic Symptoms:N/A  Hallucinations:No data recorded Ideas of Reference:None  Suicidal Thoughts:No data recorded Homicidal Thoughts:No data recorded  Sensorium  Memory: Other (comment) (Impaired)  Judgment: Poor  Insight: Poor   Executive Functions  Concentration: Poor  Attention Span: Poor  Recall: Poor  Fund of Knowledge: Poor  Language: Fair   Psychomotor Activity  Psychomotor Activity:No data recorded  Assets  Assets: Communication Skills; Desire for Improvement; Housing   Sleep  Sleep:No data recorded   Blood pressure (!) 111/97, pulse 72, temperature (!) 97.5 F (36.4 C), resp. rate 18, height 5\' 2"  (1.575 m), weight 99.8 kg, SpO2 100%. Body mass index is 40.24 kg/m.   Treatment Plan Summary: Daily contact with patient to assess and evaluate symptoms and progress in treatment, Medication management, and Plan continue current medications.  Sarina Ill, DO 02/11/2023, 12:52 PM

## 2023-02-11 NOTE — Progress Notes (Signed)
D- Patient alert and oriented x 3. Affect flat and wide eyed/mood bizarre. Denies SI/ HI/ AVH. Patient denies pain. Patient denies depression and anxiety. States that Janice Walls want a notebook to write a book. Conversation disorganized and tangential and Janice Walls is easily redirectable. Janice Walls has no complaints. A- Scheduled medications administered to patient, per MD orders. Support and encouragement provided.  Routine safety checks conducted every 15 minutes without incident.  Patient informed to notify staff with problems or concerns and verbalizes understanding. R- No adverse drug reactions noted.  Patient compliant with medications and treatment plan. Patient calm and appropriate with staff and other patients. Janice Walls isolates in her room except for meals and phone calls.  Patient contracts for safety and  remains safe on the unit at this time.

## 2023-02-11 NOTE — Group Note (Signed)
Date:  02/12/2023 Time:  12:01 AM  Group Topic/Focus:  Wrap-Up Group:   The focus of this group is to help patients review their daily goal of treatment and discuss progress on daily workbooks.    Participation Level:  Did Not Attend    Janice Walls 02/12/2023, 12:01 AM

## 2023-02-11 NOTE — Group Note (Signed)
Date:  02/11/2023 Time:  12:01 PM  Group Topic/Focus:  Goals Group:   The focus of this group is to help patients establish daily goals to achieve during treatment and discuss how the patient can incorporate goal setting into their daily lives to aide in recovery. Outdoor Recreation and Activity    Participation Level:  Active  Participation Quality:  Appropriate  Affect:  Appropriate  Cognitive:  Appropriate  Insight: Appropriate  Engagement in Group:  Engaged  Modes of Intervention:  Activity  Additional Comments:    Lynelle Smoke Jeray Shugart 02/11/2023, 12:01 PM

## 2023-02-11 NOTE — Group Note (Signed)
[facility] LCSW Group Therapy Note    Group Date: 02/11/2023 Start Time: 1400 End Time: 1500  Type of Therapy and Topic:  Group Therapy:  Overcoming Obstacles  Participation Level:  [facility] PARTICIPATION LEVEL: Did Not Attend  Mood:  Description of Group:   In this group patients will be encouraged to explore what they see as obstacles to their own wellness and recovery. They will be guided to discuss their thoughts, feelings, and behaviors related to these obstacles. The group will process together ways to cope with barriers, with attention given to specific choices patients can make. Each patient will be challenged to identify changes they are motivated to make in order to overcome their obstacles. This group will be process-oriented, with patients participating in exploration of their own experiences as well as giving and receiving support and challenge from other group members.  Therapeutic Goals: 1. Patient will identify personal and current obstacles as they relate to admission. 2. Patient will identify barriers that currently interfere with their wellness or overcoming obstacles.  3. Patient will identify feelings, thought process and behaviors related to these barriers. 4. Patient will identify two changes they are willing to make to overcome these obstacles:    Summary of Patient Progress   X   Therapeutic Modalities:   Cognitive Behavioral Therapy Solution Focused Therapy Motivational Interviewing Relapse Prevention Therapy   Elza Rafter, LCSWA

## 2023-02-12 NOTE — Group Note (Signed)
Recreation Therapy Group Note   Group Topic:Health and Wellness  Group Date: 02/12/2023 Start Time: 1030 End Time: 1130 Facilitators: Rosina Lowenstein, LRT, CTRS Location: Courtyard  Group Description: [employer]. LRT and patients played games of basketball, drew with chalk, and played corn hole while outside in the courtyard while getting fresh air and sunlight. Music was being played in the background. LRT and peers conversed about different games they have played before, what they do in their free time and anything else that is on their minds. LRT encouraged pts to drink water after being outside, sweating and getting their heart rate up.  Goal Area(s) Addressed: Patient will build on frustration tolerance skills. Patients will partake in a competitive play game with peers. Patients will gain knowledge of new leisure interest/hobby.    Affect/Mood: Appropriate   Participation Level: Active and Engaged   Participation Quality: Independent   Behavior: Calm and Cooperative   Speech/Thought Process: Loose association   Insight: Fair   Judgement: Fair    Modes of Intervention: Activity   Patient Response to Interventions:  Receptive   Education Outcome:  Acknowledges education   Clinical Observations/Individualized Feedback: Janice Walls was active in their participation of session activities and group discussion. Pt talked with peers and danced while outside in group. Pt interacted well with LRT and peers duration of session.   Plan: Continue to engage patient in RT group sessions 2-3x/week.   Rosina Lowenstein, LRT, CTRS 02/12/2023 12:26 PM

## 2023-02-12 NOTE — Progress Notes (Signed)
[facility] MD Progress Note  02/12/2023 11:18 Am Janice Walls  MRN:  [id number] Subjective:  Patient, a 37 year old Caucasian female with a history of Bipolar I Disorder, reports that she is "ready to go home" and is asking about her discharge. She denies experiencing any current delusions, hallucinations, suicidal ideation. am ready to go home. also expresses some impatience regarding her discharge status, believing she is ready to leave despite the involvement of a legal guardian. Principal Problem: Bipolar 1 disorder (HCC) Diagnosis: Principal Problem:   Bipolar 1 disorder (HCC)  Total Time spent with patient: 1 hour  Past Psychiatric History: Polysubstance Abuse, Depression Anxiety  Past Medical History:  Past Medical History:  Diagnosis Date   Anxiety    Bipolar 1 disorder (HCC)    Depression    Insomnia    Substance abuse (HCC)     Past Surgical History:  Procedure Laterality Date   CESAREAN SECTION     CESAREAN SECTION     Family History:  Family History  Problem Relation Age of Onset   CAD Father 1   Family Psychiatric  History: none reportedf Social History:  Social History   Substance and Sexual Activity  Alcohol Use Not Currently   Comment: rare     Social History   Substance and Sexual Activity  Drug Use Not Currently   Types: Marijuana, Methamphetamines    Social History   Socioeconomic History   Marital status: Single    Spouse name: Not on file   Number of children: Not on file   Years of education: Not on file   Highest education level: Not on file  Occupational History   Occupation: unemployed  Tobacco Use   Smoking status: Every Day    Current packs/day: 1.00    Average packs/day: 1 pack/day for 18.0 years (18.0 ttl pk-yrs)    Types: Cigarettes   Smokeless tobacco: Never  Vaping Use   Vaping status: Never Used  Substance and Sexual Activity   Alcohol use: Not Currently    Comment: rare   Drug use: Not Currently    Types: Marijuana,  Methamphetamines   Sexual activity: Yes    Birth control/protection: None  Other Topics Concern   Not on file  Social History Narrative   Not on file   Social Determinants of Health   Financial Resource Strain: Not on file  Food Insecurity: Patient Declined (01/30/2023)   Hunger Vital Sign    Worried About Running Out of Food in the Last Year: Patient declined    Ran Out of Food in the Last Year: Patient declined  Transportation Needs: No Transportation Needs (01/30/2023)   PRAPARE - Administrator, Civil Service (Medical): No    Lack of Transportation (Non-Medical): No  Physical Activity: Not on file  Stress: Not on file  Social Connections: Not on file   Additional Social History:                         Sleep: Good  Appetite:  Good  Current Medications: Current Facility-Administered Medications  Medication Dose Route Frequency Provider Last Rate Last Admin   acetaminophen (TYLENOL) tablet 650 mg  650 mg Oral Q6H PRN Dixon, Rashaun M, NP       alum & mag hydroxide-simeth (MAALOX/MYLANTA) 200-200-20 MG/5ML suspension 30 mL  30 mL Oral Q4H PRN Dixon, Rashaun M, NP       ARIPiprazole (ABILIFY) tablet 10 mg  10 mg Oral Daily  Lewanda Rife, MD   10 mg at 02/12/23 2841   ARIPiprazole ER (ABILIFY MAINTENA) injection 400 mg  400 mg Intramuscular Q28 days Sarina Ill, DO   400 mg at 02/07/23 1304   diphenhydrAMINE (BENADRYL) capsule 50 mg  50 mg Oral TID PRN Jearld Lesch, NP       Or   diphenhydrAMINE (BENADRYL) injection 50 mg  50 mg Intramuscular TID PRN Jearld Lesch, NP       haloperidol (HALDOL) tablet 5 mg  5 mg Oral TID PRN Jearld Lesch, NP       Or   haloperidol lactate (HALDOL) injection 5 mg  5 mg Intramuscular TID PRN Jearld Lesch, NP       hydrOXYzine (ATARAX) tablet 25 mg  25 mg Oral TID PRN Jearld Lesch, NP   25 mg at 02/07/23 2107   influenza vac split trivalent PF (FLULAVAL) injection 0.5 mL  0.5 mL  Intramuscular Tomorrow-1000 Lewanda Rife, MD       lithium carbonate (ESKALITH) ER tablet 450 mg  450 mg Oral BID Sarina Ill, DO   450 mg at 02/12/23 1724   LORazepam (ATIVAN) tablet 2 mg  2 mg Oral TID PRN Jearld Lesch, NP       Or   LORazepam (ATIVAN) injection 2 mg  2 mg Intramuscular TID PRN Jearld Lesch, NP       magnesium hydroxide (MILK OF MAGNESIA) suspension 30 mL  30 mL Oral Daily PRN Dixon, Rashaun M, NP       nicotine (NICODERM CQ - dosed in mg/24 hours) patch 14 mg  14 mg Transdermal Daily Lewanda Rife, MD   14 mg at 02/02/23 0835   QUEtiapine (SEROQUEL) tablet 100 mg  100 mg Oral QHS Sarina Ill, DO   100 mg at 02/11/23 2131   traZODone (DESYREL) tablet 50 mg  50 mg Oral QHS PRN Jearld Lesch, NP   50 mg at 02/11/23 2130     Musculoskeletal: Strength & Muscle Tone: within normal limits Gait & Station: normal Patient leans: N/A  Psychiatric Specialty Exam:  Presentation  General Appearance:  Fairly Groomed  Eye Contact: Fleeting  Speech: Clear and Coherent  Speech Volume: Decreased  Handedness: Right   Mood and Affect  Mood: Anxious (about discharged)  Affect: Flat   Thought Process  Thought Processes: Coherent  Descriptions of Associations:Loose  Orientation:Full (Time, Place and Person) (baseline)  Thought Content:Logical  History of Schizophrenia/Schizoaffective disorder:Yes  Duration of Psychotic Symptoms:Greater than six months  Hallucinations:Hallucinations: None  Ideas of Reference:None (intact)  Suicidal Thoughts:Suicidal Thoughts: No  Homicidal Thoughts:Homicidal Thoughts: No   Sensorium  Memory: Immediate Fair; Remote Fair  Judgment: Fair  Insight: Fair   Art therapist  Concentration: Fair  Attention Span: Fair  Recall: Fair  Fund of Knowledge: Fair  Language: Good   Psychomotor Activity  Psychomotor Activity: Psychomotor Activity:  Normal   Assets  Assets: Housing; Social Support; Manufacturing systems engineer; Transportation   Sleep  Sleep: Sleep: Good Number of Hours of Sleep: 8    Physical Exam: Physical Exam Vitals and nursing note reviewed.  Constitutional:      Appearance: Normal appearance.  HENT:     Head: Normocephalic and atraumatic.     Nose: Nose normal.  Pulmonary:     Effort: Pulmonary effort is normal.  Musculoskeletal:        General: Normal range of motion.     Cervical back: Normal  range of motion.  Neurological:     General: No focal deficit present.     Mental Status: She is alert and oriented to person, place, and time. Mental status is at baseline.    Review of Systems  Psychiatric/Behavioral:  The patient is nervous/anxious.    Blood pressure 99/68, pulse 75, temperature 98.1 F (36.7 C), resp. rate 18, height 5\' 2"  (1.575 m), weight 99.8 kg, SpO2 99%. Body mass index is 40.24 kg/m.   Treatment Plan Summary: Daily contact with patient to assess and evaluate symptoms and progress in treatment and Medication management 1.Continue Abilify Maintena and monitor for effectiveness and side effects. 2.Consider further psychotherapeutic interventions to help challenge the delusion while providing a supportive environment. 3.Collaborate with social work regarding the patient's discharge plans and ensure communication with the patient's legal guardian (her mother  Myriam Forehand, NP 02/12/2023, 5:38 PM

## 2023-02-12 NOTE — Progress Notes (Signed)
Patient was cooperative with treatment  on the shift on the shift, she was visible in the milieu interacting with  peers. She was compliant with her medications on shift. SShe seemed to sleep well through out the night.

## 2023-02-12 NOTE — BHH Counselor (Signed)
CSW spoke with Dariusz with [facility].  He recommends that patient come to [facility] on Wednesday, 02/14/2023 between 8AM to 2PM for a CCA to be completed for the recommendation of an ACT team.  CSW provided the contact information of patient's guardian to the ACT team Lead.  Penni Homans, MSW, LCSW 02/12/2023 4:15 PM

## 2023-02-12 NOTE — Group Note (Signed)
Date:  02/12/2023 Time:  6:22 PM  Group Topic/Focus:  Art Therapy  creating artwork, reflecting on the artwork, and connecting to personal insights. Creating artwork can help someone understand how they process their emotions    Participation Level:  Did Not Attend   Rosaura Carpenter 02/12/2023, 6:22 PM

## 2023-02-12 NOTE — Progress Notes (Signed)
Pt compliant with medications tonight- reports she was feeling much better than when she initially arrived to hospital. Pt remains delusional reporting she was just overwhelmed because she was told she was going to get pregnant., which is what lead her to come to the hospital for help. Pt calm and cooperative with staff. Appears to have slept for most of night. Denies any complaints at this time.

## 2023-02-12 NOTE — Progress Notes (Signed)
   02/12/23 1555  Psych Admission Type (Psych Patients Only)  Admission Status Involuntary  Psychosocial Assessment  Patient Complaints Irritability;Worrying;Anxiety  Eye Contact Brief;Staring  Facial Expression Anxious;Worried  Affect Anxious;Irritable  Teaching laboratory technician Activity Slow  Appearance/Hygiene In scrubs  Behavior Characteristics Cooperative;Anxious  Mood Preoccupied;Anxious  Thought Process  Coherency Disorganized;Flight of ideas  Content Preoccupation;Delusions  Delusions Paranoid  Perception Hallucinations  Hallucination Auditory  Judgment Limited  Confusion Moderate  Danger to Self  Current suicidal ideation? Denies  Agreement Not to Harm Self Yes  Danger to Others  Danger to Others None reported or observed

## 2023-02-12 NOTE — Plan of Care (Signed)
  Problem: Education: Goal: Knowledge of [facility] Education information/materials will improve Outcome: Progressing Goal: Mental status will improve Outcome: Progressing Goal: Verbalization of understanding the information provided will improve Outcome: Progressing   Problem: Health Behavior/Discharge Planning: Goal: Identification of resources available to assist in meeting health care needs will improve Outcome: Progressing Goal: Compliance with treatment plan for underlying cause of condition will improve Outcome: Progressing   Problem: Coping: Goal: Ability to identify and develop effective coping behavior will improve Outcome: Progressing Goal: Ability to interact with others will improve Outcome: Progressing Goal: Demonstration of participation in decision-making regarding own care will improve Outcome: Progressing Goal: Ability to use eye contact when communicating with others will improve Outcome: Progressing   Problem: Education: Goal: Knowledge of the prescribed therapeutic regimen will improve Outcome: Progressing   Problem: Health Behavior/Discharge Planning: Goal: Compliance with prescribed medication regimen will improve Outcome: Progressing   Problem: Role Relationship: Goal: Ability to communicate needs accurately will improve Outcome: Progressing   Problem: Safety: Goal: Ability to redirect hostility and anger into socially appropriate behaviors will improve Outcome: Progressing   Problem: Self-Care: Goal: Ability to participate in self-care as condition permits will improve Outcome: Progressing

## 2023-02-12 NOTE — Group Note (Signed)
Date:  02/12/2023 Time:  10:06 AM  Group Topic/Focus:  Dimensions of Wellness:   The focus of this group is to introduce the topic of wellness and discuss the role each dimension of wellness plays in total health.    Participation Level:  Active  Participation Quality:  Appropriate  Affect:  Appropriate  Cognitive:  Appropriate  Insight: Appropriate  Engagement in Group:  Developing/Improving and Engaged  Modes of Intervention:  Activity, Discussion, and Education  Additional Comments:    Lameeka Schleifer 02/12/2023, 10:06 AM

## 2023-02-12 NOTE — Group Note (Signed)
Date:  02/12/2023 Time:  9:25 PM  Group Topic/Focus:  Wellness Toolbox:   The focus of this group is to discuss various aspects of wellness, balancing those aspects and exploring ways to increase the ability to experience wellness.  Patients will create a wellness toolbox for use upon discharge. Wrap-Up Group:   The focus of this group is to help patients review their daily goal of treatment and discuss progress on daily workbooks.    Participation Level:  Minimal  Participation Quality:  Attentive  Affect:  Appropriate and Flat  Cognitive:  Alert  Insight: Good  Engagement in Group:  Limited  Modes of Intervention:  Discussion  Additional Comments:     Maglione,Avien Taha E 02/12/2023, 9:25 PM

## 2023-02-13 MED ORDER — QUETIAPINE FUMARATE 100 MG PO TABS: 100.0000 mg | ORAL_TABLET | Freq: Every day | ORAL | 0 refills | Status: DC

## 2023-02-13 MED ORDER — TRAZODONE HCL 50 MG PO TABS: 50.0000 mg | ORAL_TABLET | Freq: Every evening | ORAL | 0 refills | Status: AC | PRN

## 2023-02-13 MED ORDER — NICOTINE 14 MG/24HR TD PT24: 14.0000 mg | MEDICATED_PATCH | Freq: Every day | TRANSDERMAL | 0 refills | Status: AC

## 2023-02-13 MED ORDER — ARIPIPRAZOLE 10 MG PO TABS: 10.0000 mg | ORAL_TABLET | Freq: Every day | ORAL | 0 refills | Status: AC

## 2023-02-13 MED ORDER — ARIPIPRAZOLE 10 MG PO TABS: 10.0000 mg | ORAL_TABLET | Freq: Every day | ORAL | 0 refills | Status: DC

## 2023-02-13 MED ORDER — NICOTINE 14 MG/24HR TD PT24: 14.0000 mg | MEDICATED_PATCH | Freq: Every day | TRANSDERMAL | 0 refills | Status: DC

## 2023-02-13 MED ORDER — TRAZODONE HCL 50 MG PO TABS: 50.0000 mg | ORAL_TABLET | Freq: Every evening | ORAL | 0 refills | Status: DC | PRN

## 2023-02-13 MED ORDER — LITHIUM CARBONATE ER 450 MG PO TBCR: 450.0000 mg | EXTENDED_RELEASE_TABLET | Freq: Two times a day (BID) | ORAL | 0 refills | Status: AC

## 2023-02-13 MED ORDER — QUETIAPINE FUMARATE 100 MG PO TABS: 100.0000 mg | ORAL_TABLET | Freq: Every day | ORAL | 0 refills | Status: AC

## 2023-02-13 MED ORDER — LITHIUM CARBONATE ER 450 MG PO TBCR: 450.0000 mg | EXTENDED_RELEASE_TABLET | Freq: Two times a day (BID) | ORAL | 0 refills | Status: DC

## 2023-02-13 NOTE — Group Note (Signed)
Recreation Therapy Group Note   Group Topic:Goal Setting  Group Date: 02/13/2023 Start Time: 1000 End Time: 1100 Facilitators: Rosina Lowenstein, LRT, CTRS Location:  Craft Room  Group Description: Scientist, research (physical sciences). Patients were given many different magazines, a glue stick, markers, and a piece of cardstock paper. LRT and pts discussed the importance of having goals in life. LRT and pts discussed the difference between short-term and long-term goals, as well as what a SMART goal is. LRT encouraged pts to create a vision board, with images they picked and then cut out with safety scissors from the magazine, for themselves, that capture their short and long-term goals. LRT encouraged pts to show and explain their vision board to the group.   Goal Area(s) Addressed:  Patient will gain knowledge of short vs. long term goals.  Patient will identify goals for themselves. Patient will practice setting SMART goals. Patient will verbalize their goals to LRT and peers.   Affect/Mood: N/A   Participation Level: Did not attend    Clinical Observations/Individualized Feedback: Janice Walls did not attend group.  Plan: Continue to engage patient in RT group sessions 2-3x/week.   Rosina Lowenstein, LRT, CTRS 02/13/2023 11:36 AM

## 2023-02-13 NOTE — Progress Notes (Signed)
  [facility] Case Management Discharge Plan :  Will you be returning to the same living situation after discharge:  Yes,  pt is returning home.  At discharge, do you have transportation home?: Yes,  mother/guardian is providing transportation. Do you have the ability to pay for your medications: Yes,  [employer]  Release of information consent forms completed and in the chart;  Patient's signature needed at discharge.  Patient to Follow up at:  Follow-up Information     [facility] Follow up.   Why: Appointment is scheudled for 02/16/2023 at 9AM. For ACT please come on Wednesday between 8AM and 2PM for a CCA to be referred for ACT, be sure to tell them it's for ACT services. Contact information: [address] [place] [address] [phone]                 Next level of care provider has access to [facility] Link:no  Safety Planning and Suicide Prevention discussed: Yes,  SPE completed with the patient's guardian.  Have you used any form of tobacco in the last 30 days? (Cigarettes, Smokeless Tobacco, Cigars, and/or Pipes): Yes  Has patient been referred to the Quitline?: Patient refused referral for treatment  Patient has been referred for addiction treatment: Patient refused referral for treatment.  Harden Mo, LCSW 02/13/2023, 10:15 AM

## 2023-02-13 NOTE — [facility] Suicide Risk Assessment (Signed)
[facility] Discharge Suicide Risk Assessment   Principal Problem: Bipolar 1 disorder (HCC) Discharge Diagnoses: Principal Problem:   Bipolar 1 disorder (HCC)   Total Time spent with patient: 2.5 hours  Musculoskeletal: Strength & Muscle Tone: within normal limits Gait & Station: normal Patient leans: N/A  Psychiatric Specialty Exam  Presentation  General Appearance:  Fairly Groomed  Eye Contact: Good  Speech: Clear and Coherent; Pressured  Speech Volume: Increased  Handedness: Right   Mood and Affect  Mood: Anxious (about discharge)  Duration of Depression Symptoms: Greater than two weeks  Affect: Full Range   Thought Process  Thought Processes: Coherent  Descriptions of Associations:Intact  Orientation:Full (Time, Place and Person)  Thought Content:WDL  History of Schizophrenia/Schizoaffective disorder:Yes  Duration of Psychotic Symptoms:Greater than six months  Hallucinations:Hallucinations: None  Ideas of Reference:None  Suicidal Thoughts:Suicidal Thoughts: No  Homicidal Thoughts:Homicidal Thoughts: No   Sensorium  Memory: Remote Fair; Immediate Good  Judgment: Fair  Insight: Fair   Art therapist  Concentration: Fair  Attention Span: Fair  Recall: Fair  Fund of Knowledge: Good  Language: Good   Psychomotor Activity  Psychomotor Activity: Psychomotor Activity: Normal   Assets  Assets: Housing; Social Support; Communication Skills   Sleep  Sleep: Sleep: Good Number of Hours of Sleep: 8   Physical Exam: Physical Exam ROS Blood pressure 99/68, pulse 95, temperature 97.8 F (36.6 C), resp. rate 18, height 5\' 2"  (1.575 m), weight 99.8 kg, SpO2 100%. Body mass index is 40.24 kg/m.  Mental Status Per Nursing Assessment::   On Admission:  NA  Demographic Factors:  Caucasian, Low socioeconomic status, Living alone, and Unemployed  Loss Factors: Financial problems/change in socioeconomic  status  Historical Factors: Family history of mental illness or substance abuse and Impulsivity  Risk Reduction Factors:   Living with another person, especially a relative and Positive social support  Continued Clinical Symptoms:  Bipolar Disorder:   Mixed State Alcohol/Substance Abuse/Dependencies More than one psychiatric diagnosis Previous Psychiatric Diagnoses and Treatments  Cognitive Features That Contribute To Risk:  Thought constriction (tunnel vision)    Suicide Risk:  Minimal: No identifiable suicidal ideation.  Patients presenting with no risk factors but with morbid ruminations; may be classified as minimal risk based on the severity of the depressive symptoms   Follow-up Information     [facility] Follow up.   Why: Appointment is scheudled for 02/16/2023 at 9AM. For ACT please come on Wednesday between 8AM and 2PM for a CCA to be referred for ACT, be sure to tell them it's for ACT services. Contact information: [address] [place] [address] [phone]                 Plan Of Care/Follow-up recommendations:  Activity:  as tolerated Diet:  heart healthy 1.Patient will follow up with her outpatient psychiatrist within one week of discharge. 2.Guardianship and discharge coordination will be discussed with her mother, who holds reported guardianship 3.Encourage adherence to medications and continued participation in therapy. 4.Consider state hospital admission if psychotic symptoms persist or worsen, despite her current non-violent status. 5.Continue to monitor for any suicidal or homicidal ideation and hallucinations, and reassess her psychiatric stability daily. 6.Abilify 10 mg daily 7.Lithium 450 mg BID 8.Seroquel 100 mg daily  Myriam Forehand, NP 02/13/2023, 10:20 AM

## 2023-02-13 NOTE — Progress Notes (Signed)
Patient is discharging at this time. Patient is Alert and stable. Patient denies SI,HI, and A/V/H with no plan/intent. Printed AVS reviewed with and given to patient along with medications and follow up appointments. Suicide safety plan complete with copy provided to patient. Original form in assigned binder. Patient verbalized all understanding. All valuables/belongings returned to patient. Patient is being transported by her mother . Patient denies any pain/discomfort. No s/s of current distress.
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
## 2023-02-13 NOTE — Plan of Care (Signed)
  Problem: Education: Goal: Knowledge of [facility] Education information/materials will improve Outcome: Progressing Goal: Mental status will improve Outcome: Progressing   Problem: Education: Goal: Mental status will improve Outcome: Progressing   Problem: Health Behavior/Discharge Planning: Goal: Compliance with treatment plan for underlying cause of condition will improve Outcome: Progressing

## 2024-05-18 ENCOUNTER — Encounter: Payer: Self-pay | Admitting: *Deleted

## 2024-05-18 ENCOUNTER — Emergency Department: Admission: EM | Admit: 2024-05-18 | Discharge: 2024-05-20 | Disposition: A | Payer: MEDICAID

## 2024-05-18 ENCOUNTER — Other Ambulatory Visit: Payer: Self-pay

## 2024-05-18 DIAGNOSIS — R259 Unspecified abnormal involuntary movements: Secondary | ICD-10-CM | POA: Diagnosis not present

## 2024-05-18 DIAGNOSIS — F319 Bipolar disorder, unspecified: Secondary | ICD-10-CM | POA: Insufficient documentation

## 2024-05-18 DIAGNOSIS — Z046 Encounter for general psychiatric examination, requested by authority: Secondary | ICD-10-CM

## 2024-05-18 DIAGNOSIS — Z79899 Other long term (current) drug therapy: Secondary | ICD-10-CM | POA: Diagnosis not present

## 2024-05-18 LAB — COMPREHENSIVE METABOLIC PANEL WITH GFR
ALT: 37 U/L (ref 0–44)
AST: 28 U/L (ref 15–41)
Albumin: 4.1 g/dL (ref 3.5–5.0)
Alkaline Phosphatase: 86 U/L (ref 38–126)
Anion gap: 15 (ref 5–15)
BUN: 11 mg/dL (ref 6–20)
CO2: 21 mmol/L — ABNORMAL LOW (ref 22–32)
Calcium: 9 mg/dL (ref 8.9–10.3)
Chloride: 104 mmol/L (ref 98–111)
Creatinine, Ser: 0.63 mg/dL (ref 0.44–1.00)
GFR, Estimated: >60 mL/min
Glucose, Bld: 78 mg/dL (ref 70–99)
Potassium: 3.9 mmol/L (ref 3.5–5.1)
Sodium: 140 mmol/L (ref 135–145)
Total Bilirubin: 0.3 mg/dL (ref 0.0–1.2)
Total Protein: 6.9 g/dL (ref 6.5–8.1)

## 2024-05-18 LAB — ETHANOL: Alcohol, Ethyl (B): <15 mg/dL

## 2024-05-18 LAB — SALICYLATE LEVEL: Salicylate Lvl: <7 mg/dL — ABNORMAL LOW (ref 7.0–30.0)

## 2024-05-18 LAB — ACETAMINOPHEN LEVEL: Acetaminophen (Tylenol), Serum: <10 ug/mL — ABNORMAL LOW (ref 10–30)

## 2024-05-18 NOTE — ED Provider Notes (Signed)
 "  [facility] Provider Note    Event Date/Time   First MD Initiated Contact with Patient 05/18/24 2140     (approximate)   History   Psychiatric Evaluation   HPI  Janice Walls is a 39 y.o. female who comes in under IVC.  Patient reports that she has been off of her medication and she feels that she is gone.  She denies any alcohol, drug use or any other concerns.  Patient denies any medical concerns.  She denies any falls or hit her head.  She denies any SI, HI.  She denies knowing why she is here.  According to IVC paperwork patient is been off of her medications and has not been acting her normal self.  Physical Exam   Triage Vital Signs: ED Triage Vitals  Encounter Vitals Group     BP 05/18/24 2127 117/79     Girls Systolic BP Percentile --      Girls Diastolic BP Percentile --      Boys Systolic BP Percentile --      Boys Diastolic BP Percentile --      Pulse Rate 05/18/24 2127 (!) 112     Resp 05/18/24 2127 18     Temp 05/18/24 2127 98.4 F (36.9 C)     Temp Source 05/18/24 2127 Oral     SpO2 05/18/24 2127 97 %     Weight 05/18/24 2125 218 lb 4.1 oz (99 kg)     Height 05/18/24 2125 5' 2 (1.575 m)     Head Circumference --      Peak Flow --      Pain Score 05/18/24 2125 0     Pain Loc --      Pain Education --      Exclude from Growth Chart --     Most recent vital signs: Vitals:   05/18/24 2127  BP: 117/79  Pulse: (!) 112  Resp: 18  Temp: 98.4 F (36.9 C)  SpO2: 97%     General: Awake, no distress.  CV:  Good peripheral perfusion.  Resp:  Normal effort.  Abd:  No distention.  Other:  Moving all extremities   ED Results / Procedures / Treatments   Labs (all labs ordered are listed, but only abnormal results are displayed) Labs Reviewed  COMPREHENSIVE METABOLIC PANEL WITH GFR  ETHANOL  CBC  URINE DRUG SCREEN  POC URINE PREG, ED     RADIOLOGY I have reviewed the xray personally and interpreted     PROCEDURES:  Critical Care performed: No  Procedures   MEDICATIONS ORDERED IN ED: Medications - No data to display   IMPRESSION / MDM / ASSESSMENT AND PLAN / ED COURSE  I reviewed the triage vital signs and the nursing notes.   Patient's presentation is most consistent with acute presentation with potential threat to life or bodily function.   Pt is without any acute medical complaints. No exam findings to suggest medical cause of current presentation. Will order psychiatric screening labs and discuss further w/ psychiatric service.  D/d includes but is not limited to psychiatric disease, behavioral/personality disorder, inadequate socioeconomic support, medical.  Patient handed off to oncoming team pending blood work.  The patient has been placed in psychiatric observation due to the need to provide a safe environment for the patient while obtaining psychiatric consultation and evaluation, as well as ongoing medical and medication management to treat the patient's condition.  The patient has been placed under full  IVC at this time.    The patient is on the cardiac monitor to evaluate for evidence of arrhythmia and/or significant heart rate changes.      FINAL CLINICAL IMPRESSION(S) / ED DIAGNOSES   Final diagnoses:  Involuntary commitment     Rx / DC Orders   ED Discharge Orders     None        Note:  This document was prepared using Dragon voice recognition software and may include unintentional dictation errors.   Ernest Ronal BRAVO, MD 05/18/24 2255  "

## 2024-05-18 NOTE — ED Notes (Signed)
 Black pants Black plaid shirt Tan suede boots White socks Black hair tie [employer]

## 2024-05-18 NOTE — ED Triage Notes (Signed)
 Pt ambulatory to triage.  Pt is IVC.  IVC papers state pt is off her meds and is in crisis tonight.  Pt states she is God.  Pt calm and cooperative at this time.   pt denies etoh and drug use.  Pt denies SI or HI

## 2024-05-19 LAB — CBC
HCT: 43.5 % (ref 36.0–46.0)
Hemoglobin: 13.8 g/dL (ref 12.0–15.0)
MCH: 25.8 pg — ABNORMAL LOW (ref 26.0–34.0)
MCHC: 31.7 g/dL (ref 30.0–36.0)
MCV: 81.3 fL (ref 80.0–100.0)
Platelets: 321 K/uL (ref 150–400)
RBC: 5.35 MIL/uL — ABNORMAL HIGH (ref 3.87–5.11)
RDW: 16.7 % — ABNORMAL HIGH (ref 11.5–15.5)
WBC: 11.6 K/uL — ABNORMAL HIGH (ref 4.0–10.5)
nRBC: 0 % (ref 0.0–0.2)

## 2024-05-19 LAB — URINE DRUG SCREEN
Amphetamines: NEGATIVE
Barbiturates: NEGATIVE
Benzodiazepines: NEGATIVE
Cocaine: NEGATIVE
Fentanyl: NEGATIVE
Methadone Scn, Ur: NEGATIVE
Opiates: NEGATIVE
Tetrahydrocannabinol: NEGATIVE

## 2024-05-19 MED ORDER — ARIPIPRAZOLE 10 MG PO TABS
10.0000 mg | ORAL_TABLET | Freq: Every day | ORAL | Status: DC
  Administered 2024-05-19: 10 mg via ORAL
  Filled 2024-05-19: qty 1

## 2024-05-19 NOTE — Progress Notes (Signed)
 Per [facility]), there are no appropriate beds available within [facility] system.  Patient has been referred to the following facilities:  Service Provider Phone  [facility]  [phone]  [facility]  [phone]  [facility]  [phone]  [facility]  [phone]  [facility]  [phone]  [facility]  [phone]  [facility]  [phone]  [facility]  [phone]  [facility]  [phone]  [place] [facility]  [phone]  [facility]  [phone]  [facility]  [phone]  [facility]  [phone]  [facility]  [phone]  [facility]  [phone]  [facility]  [phone]  [facility]  [address] [facility] [phone]

## 2024-05-19 NOTE — ED Notes (Signed)
"  Pt given meal tray.   "

## 2024-05-19 NOTE — ED Notes (Signed)
Water was given .

## 2024-05-19 NOTE — ED Notes (Signed)
 Patient sleeping

## 2024-05-19 NOTE — ED Notes (Signed)
TTS in speaking with patient. 

## 2024-05-19 NOTE — ED Provider Notes (Signed)
 39 year old female awaiting psychiatric evaluation, at this time recommending inpatient placement.  No acute events overnight during my shift.   Fernand Rossie HERO, MD 05/19/24 0630
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
## 2024-05-19 NOTE — Consult Note (Signed)
 [facility] Psychiatric Consult Initial  Patient Name: .Janice Walls  MRN: [id number]  DOB: 09-Mar-1986  Consult Order details:  Orders (From admission, onward)     Start     Ordered   05/18/24 2154  CONSULT TO CALL ACT TEAM       Ordering Provider: Ernest Ronal BRAVO, MD  Provider:  (Not yet assigned)  Question:  Reason for Consult?  Answer:  Psych consult   05/18/24 2154   05/18/24 2154  IP CONSULT TO PSYCHIATRY       Ordering Provider: Ernest Ronal BRAVO, MD  Provider:  (Not yet assigned)  Question:  Reason for consult:  Answer:  Medication management   05/18/24 2154             Mode of Visit: Tele-visit Virtual Statement:TELE PSYCHIATRY ATTESTATION & CONSENT As the provider for this telehealth consult, I attest that I verified the patient's identity using two separate identifiers, introduced myself to the patient, provided my credentials, disclosed my location, and performed this encounter via a HIPAA-compliant, real-time, face-to-face, two-way, interactive audio and video platform and with the full consent and agreement of the patient (or guardian as applicable.) Patient physical location: [facility]. Telehealth provider physical location: home office in state of [place] .   Video start time:   Video end time:      Psychiatry Consult Evaluation  Service Date: May 19, 2024 LOS:  LOS: 0 days  Chief Complaint IVC Psychiatric Eval  Primary Psychiatric Diagnoses  Bipolar 1   Assessment  Janice Walls is a 39 y.o. female admitted: Presented to the EDfor 05/18/2024  9:40 PM for acute psychiatric crisis with medication nonadherence and psychotic symptoms. She carries the psychiatric diagnoses of unspecified psychotic disorder vs. schizophrenia or schizoaffective disorder and has a past medical history of prior inpatient psychiatric hospitalizations and limited outpatient follow-up.  Her current presentation of grandiose delusions (stating she is God),  disorganized thought processes, delayed responses, poor insight, medication noncompliance, and impaired ability to provide coherent history is most consistent with acute psychotic decompensation likely due to antipsychotic nonadherence. She meets criteria for inpatient psychiatric hospitalization based on active psychotic symptoms, inability to care for herself independently, poor insight into illness, inconsistent history regarding outpatient services, and medication noncompliance. Current outpatient psychotropic medications include Seroquel , and historically she has had a partial response to this medication. She was non-compliant with medications prior to admission as evidenced by self-report of not taking Seroquel  for several days and IVC paperwork noting she is off medications.  On initial examination, patient is calm and cooperative but minimally engaged, exhibits delayed and tangential responses, denies SI/HI/AVH, appears internally preoccupied, intermittently stares blankly, lies in bed with head covered for part of the interview, and demonstrates poor insight and judgment. Diagnoses:  Active Hospital problems: Active Problems:   * No active hospital problems. *    Plan   ## Psychiatric Medication Recommendations:  Abilify , Seroquel   ## Medical Decision Making Capacity: Not specifically addressed in this encounter  ## Disposition:-- We recommend inpatient psychiatric hospitalization after medical hospitalization. Patient has been involuntarily committed on 05/19/2015.   ## Behavioral / Environmental: - No specific recommendations at this time.     ## Safety and Observation Level:  - Based on my clinical evaluation, I estimate the patient to be at no risk of self harm in the current setting. - At this time, we recommend  routine. This decision is based on my review of the chart including patient's history  and current presentation, interview of the patient, mental status examination, and  consideration of suicide risk including evaluating suicidal ideation, plan, intent, suicidal or self-harm behaviors, risk factors, and protective factors. This judgment is based on our ability to directly address suicide risk, implement suicide prevention strategies, and develop a safety plan while the patient is in the clinical setting. Please contact our team if there is a concern that risk level has changed.  CSSR Risk Category:C-SSRS RISK CATEGORY: No Risk  Suicide Risk Assessment: Patient has following modifiable risk factors for suicide: medication noncompliance, which we are addressing by inpatient admission. Patient has following non-modifiable or demographic risk factors for suicide: psychiatric hospitalization Patient has the following protective factors against suicide: Access to outpatient mental health care  Thank you for this consult request. Recommendations have been communicated to the primary team.  We will recommend inpatient admission at this time.   Carloyn Lahue, NP       History of Present Illness  Relevant Aspects of Hospital ED Course:  Admitted on 05/18/2024 for IVC.  Patient Report:  The patient is a 39 year old female brought to the ED under Involuntary Commitment (IVC) for an acute psychiatric crisis. Per IVC paperwork, the patient is reportedly off her medications and was in crisis tonight. During the interview, the patient states she does not understand why she is in the hospital and reports that she was only prescribed sleep medication, denying that she has any mental health problems.  The patient makes grandiose statements, including stating that she is God, and later states she needs to be set free, though she is unable to explain what she means by this. She denies suicidal ideation, homicidal ideation, and auditory or visual hallucinations. She denies alcohol or illicit drug use.  The patient reports a history of prior inpatient psychiatric  hospitalizations. She initially denies having outpatient services, but later states she has an ACT team, though she is unable to recall the agency name or when they last visited, stating it has been a while. She reports she was previously prescribed Seroquel  but has not taken it for several days.  Throughout the assessment, the patient appears tired and minimally engaged, stating she just wants to rest. She exhibits delayed responses, gives random or unrelated answers to questions, and frequently stares blankly into the room. She spends a significant portion of the interview lying in bed with her head covered, eventually sitting up for the remainder of the assessment.  The patient reports living alone and does not identify any current supports.  Psych ROS:  Depression: denies   Anxiety:  denies Mania (lifetime and current): no Psychosis: (lifetime and current): no  Review of Systems  Constitutional: Negative.   HENT: Negative.    Eyes: Negative.   Respiratory: Negative.    Cardiovascular: Negative.   Gastrointestinal: Negative.   Genitourinary: Negative.   Musculoskeletal: Negative.   Skin: Negative.   Neurological: Negative.      Psychiatric and Social History  Psychiatric History:  Information collected from Patient   Prev Dx/Sx: bipolar Current Psych Provider: not provider Home Meds (current): not provider                        Previous Med Trials: not provided Therapy: Denies  Prior psych Hospitalization: Yes Prior Self Harm: Yes Prior Violence: No  Family Psych History: Not provided Family Hx suicide: Not provided  Social History:  Developmental Hx: Unknown Educational Hx: Family Occupational Hx: Unknown  Legal Hx: Denies Living Situation: Lives alone Spiritual Hx: Unknown Access to weapons/lethal means: No  Substance History Alcohol: denies  Tobacco: Denies Illicit drugs: Denies Prescription drug abuse: Denies Rehab hx: Denies  Exam Findings   Vital  Signs:  Temp:  [98.4 F (36.9 C)] 98.4 F (36.9 C) (01/18 2127) Pulse Rate:  [112] 112 (01/18 2127) Resp:  [18] 18 (01/18 2127) BP: (117)/(79) 117/79 (01/18 2127) SpO2:  [97 %] 97 % (01/18 2127) Weight:  [99 kg] 99 kg (01/18 2125) Blood pressure 117/79, pulse (!) 112, temperature 98.4 F (36.9 C), temperature source Oral, resp. rate 18, height 5' 2 (1.575 m), weight 99 kg, SpO2 97%. Body mass index is 39.92 kg/m.  Physical Exam HENT:     Head: Normocephalic.     Nose: Nose normal.     Mouth/Throat:     Pharynx: Oropharynx is clear.  Eyes:     Extraocular Movements: Extraocular movements intact.  Pulmonary:     Effort: Pulmonary effort is normal.  Musculoskeletal:        General: Normal range of motion.     Cervical back: Normal range of motion.  Skin:    General: Skin is dry.  Neurological:     Mental Status: She is alert.     Other History   These have been pulled in through the EMR, reviewed, and updated if appropriate.  Family History:  The patient's family history includes CAD (age of onset: 71) in her father.  Medical History: Past Medical History:  Diagnosis Date   Anxiety    Bipolar 1 disorder (HCC)    Depression    Insomnia    Substance abuse (HCC)     Surgical History: Past Surgical History:  Procedure Laterality Date   CESAREAN SECTION     CESAREAN SECTION       Medications:  Current Medications[1]  Allergies: Allergies[2]  Phenix Vandermeulen, NP      [1]  Current Facility-Administered Medications:    ARIPiprazole  (ABILIFY ) tablet 10 mg, 10 mg, Oral, Daily, Kyomi Hector, NP  Current Outpatient Medications:    ARIPiprazole  (ABILIFY ) 10 MG tablet, Take 1 tablet (10 mg total) by mouth daily., Disp: 30 tablet, Rfl: 0   lithium  carbonate (ESKALITH ) 450 MG ER tablet, Take 1 tablet (450 mg total) by mouth 2 (two) times daily., Disp: 60 tablet, Rfl: 0   QUEtiapine  (SEROQUEL ) 100 MG tablet, Take 1 tablet (100 mg total) by  mouth at bedtime., Disp: 30 tablet, Rfl: 0   traZODone  (DESYREL ) 50 MG tablet, Take 1 tablet (50 mg total) by mouth at bedtime as needed for sleep., Disp: 30 tablet, Rfl: 0 [2] No Known Allergies

## 2024-05-19 NOTE — BH Assessment (Addendum)
 Comprehensive Clinical Assessment (CCA) Note  05/19/2024 Janice Walls [id number] Recommendations for Services/Supports/Treatments: Consulted with NP Jon HERO., who recommended pt for inpatient treatment. Janice Walls is a 39 year old Caucasian female. Pt presents under IVC for a psych eval. Per triage note: Pt ambulatory to triage. Pt is IVC. IVC papers state pt is off her meds and is in crisis tonight. Pt states she is God. Pt calm and cooperative at this time. pt denies etoh and drug use. Pt denies SI or HI  Upon assessment, when asked why she'd presented to the ER pt. stated, I don't have any mental health problems; I'm just tired. Pt then went on bizarre tangents that were nonsensical and delusional. When asked about medication compliance pt stated, I know I'm God, I'm going to be ok. Pt was guarded and not expansive when probed. Pt's responses were at times irrelevant. Pt presented with a disorganized presentation. Pt's thoughts were loose and difficult to follow. The pt. had normal psychomotor activity. Pt appeared to be responding to internal stimuli. Pt identified her main stressor as needing to be set free; however, pt would not expand. The patient denied current SI, HI or AV/H. Pt denied substance use. Chief Complaint:  Chief Complaint  Patient presents with   Psychiatric Evaluation   Visit Diagnosis: Bipolar 1 Disorder    CCA Screening, Triage and Referral (STR)  Patient Reported Information How did you hear about us ? No data recorded Referral name: No data recorded Referral phone number: No data recorded  Whom do you see for routine medical problems? No data recorded Practice/Facility Name: No data recorded Practice/Facility Phone Number: No data recorded Name of Contact: No data recorded Contact Number: No data recorded Contact Fax Number: No data recorded Prescriber Name: No data recorded Prescriber Address (if known): No data recorded  What Is the Reason for  Your Visit/Call Today? No data recorded How Long Has This Been Causing You Problems? No data recorded What Do You Feel Would Help You the Most Today? No data recorded  Have You Recently Been in Any Inpatient Treatment (Hospital/Detox/Crisis Center/28-Day Program)? No data recorded Name/Location of Program/Hospital:No data recorded How Long Were You There? No data recorded When Were You Discharged? No data recorded  Have You Ever Received Services From [facility] Before? No data recorded Who Do You See at [facility]? No data recorded  Have You Recently Had Any Thoughts About Hurting Yourself? No data recorded Are You Planning to Commit Suicide/Harm Yourself At This time? No data recorded  Have you Recently Had Thoughts About Hurting Someone Sherral? No data recorded Explanation: No data recorded  Have You Used Any Alcohol or Drugs in the Past 24 Hours? No data recorded How Long Ago Did You Use Drugs or Alcohol? No data recorded What Did You Use and How Much? No data recorded  Do You Currently Have a Therapist/Psychiatrist? No data recorded Name of Therapist/Psychiatrist: No data recorded  Have You Been Recently Discharged From Any Office Practice or Programs? No data recorded Explanation of Discharge From Practice/Program: No data recorded    CCA Screening Triage Referral Assessment Type of Contact: No data recorded Is this Initial or Reassessment? No data recorded Date Telepsych consult ordered in [facility]:  No data recorded Time Telepsych consult ordered in [facility]:  No data recorded  Patient Reported Information Reviewed? No data recorded Patient Left Without Being Seen? No data recorded Reason for Not Completing Assessment: No data recorded  Collateral Involvement: No data recorded  Does Patient  Have a Automotive Engineer Guardian? No data recorded Name and Contact of Legal Guardian: No data recorded If Minor and Not Living with Parent(s), Who has Custody? No data recorded Is  CPS involved or ever been involved? No data recorded Is APS involved or ever been involved? No data recorded  Patient Determined To Be At Risk for Harm To Self or Others Based on Review of Patient Reported Information or Presenting Complaint? No data recorded Method: No data recorded Availability of Means: No data recorded Intent: No data recorded Notification Required: No data recorded Additional Information for Danger to Others Potential: No data recorded Additional Comments for Danger to Others Potential: No data recorded Are There Guns or Other Weapons in Your Home? No data recorded Types of Guns/Weapons: No data recorded Are These Weapons Safely Secured?                            No data recorded Who Could Verify You Are Able To Have These Secured: No data recorded Do You Have any Outstanding Charges, Pending Court Dates, Parole/Probation? No data recorded Contacted To Inform of Risk of Harm To Self or Others: No data recorded  Location of Assessment: No data recorded  Does Patient Present under Involuntary Commitment? No data recorded IVC Papers Initial File Date: No data recorded  [place] of Residence: No data recorded  Patient Currently Receiving the Following Services: No data recorded  Determination of Need: No data recorded  Options For Referral: No data recorded    CCA Biopsychosocial Intake/Chief Complaint:  No data recorded Current Symptoms/Problems: No data recorded  Patient Reported Schizophrenia/Schizoaffective Diagnosis in Past: No data recorded  Strengths: No data recorded Preferences: No data recorded Abilities: No data recorded  Type of Services Patient Feels are Needed: No data recorded  Initial Clinical Notes/Concerns: No data recorded  Mental Health Symptoms Depression:  No data recorded  Duration of Depressive symptoms: No data recorded  Mania:  No data recorded  Anxiety:   No data recorded  Psychosis:  No data recorded  Duration of  Psychotic symptoms: No data recorded  Trauma:  No data recorded  Obsessions:  No data recorded  Compulsions:  No data recorded  Inattention:  No data recorded  Hyperactivity/Impulsivity:  No data recorded  Oppositional/Defiant Behaviors:  No data recorded  Emotional Irregularity:  No data recorded  Other Mood/Personality Symptoms:  No data recorded   Mental Status Exam Appearance and self-care  Stature:  No data recorded  Weight:  No data recorded  Clothing:  No data recorded  Grooming:  No data recorded  Cosmetic use:  No data recorded  Posture/gait:  No data recorded  Motor activity:  No data recorded  Sensorium  Attention:  No data recorded  Concentration:  No data recorded  Orientation:  No data recorded  Recall/memory:  No data recorded  Affect and Mood  Affect:  No data recorded  Mood:  No data recorded  Relating  Eye contact:  No data recorded  Facial expression:  No data recorded  Attitude toward examiner:  No data recorded  Thought and Language  Speech flow: No data recorded  Thought content:  No data recorded  Preoccupation:  No data recorded  Hallucinations:  No data recorded  Organization:  No data recorded  [employer] of Knowledge:  No data recorded  Intelligence:  No data recorded  Abstraction:  No data recorded  Judgement:  No  data recorded  Reality Testing:  No data recorded  Insight:  No data recorded  Decision Making:  No data recorded  Social Functioning  Social Maturity:  No data recorded  Social Judgement:  No data recorded  Stress  Stressors:  No data recorded  Coping Ability:  No data recorded  Skill Deficits:  No data recorded  Supports:  No data recorded    Religion:    Leisure/Recreation:    Exercise/Diet:     CCA Employment/Education Employment/Work Situation:    Education:     CCA Family/Childhood History Family and Relationship History:    Childhood History:     Child/Adolescent Assessment:      CCA Substance Use Alcohol/Drug Use:                           ASAM's:  Six Dimensions of Multidimensional Assessment  Dimension 1:  Acute Intoxication and/or Withdrawal Potential:      Dimension 2:  Biomedical Conditions and Complications:      Dimension 3:  Emotional, Behavioral, or Cognitive Conditions and Complications:     Dimension 4:  Readiness to Change:     Dimension 5:  Relapse, Continued use, or Continued Problem Potential:     Dimension 6:  Recovery/Living Environment:     ASAM Severity Score:    ASAM Recommended Level of Treatment:     Substance use Disorder (SUD)    Recommendations for Services/Supports/Treatments:    DSM5 Diagnoses: Patient Active Problem List   Diagnosis Date Noted   Bipolar 1 disorder (HCC) 01/30/2023   Bipolar affective disorder, currently manic, moderate (HCC) 08/05/2022   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   Nonspecific chest pain    Bipolar I disorder, most recent episode (or current) manic (HCC) 04/05/2017   Polysubstance abuse (HCC) 04/04/2017   Hepatitis C 09/18/2016   Bipolar affective disorder, current episode manic (HCC) 09/12/2016   Tobacco use disorder 09/12/2016    Patient Centered Plan: Patient is on the following Treatment Plan(s):     Referrals to Alternative Service(s): Referred to Alternative Service(s):   Place:   Date:   Time:    Referred to Alternative Service(s):   Place:   Date:   Time:    Referred to Alternative Service(s):   Place:   Date:   Time:    Referred to Alternative Service(s):   Place:   Date:   Time:      @[facility] @  Brahim Dolman R Ripken Rekowski, LCAS

## 2024-05-19 NOTE — ED Notes (Signed)
IVC/pending psych inpatient admission when medically cleared.

## 2024-05-19 NOTE — ED Notes (Signed)
 Meal provided

## 2024-05-19 NOTE — ED Notes (Signed)
 Meal tray provided. Tray checked for any potential hazards. Pt denies no additional needs at this time.

## 2024-05-19 NOTE — ED Notes (Signed)
 IVC  GOING TO  Janice Walls  DUNES ON 05/21/23

## 2024-05-19 NOTE — ED Notes (Signed)
 Pt given phone during designated time.

## 2024-05-19 NOTE — ED Notes (Signed)
 Urine preg. Was (-) neg.

## 2024-05-19 NOTE — Progress Notes (Signed)
 Patient has been accepted to [facility] for 05/20/24. Patient was assigned to [address]. Accepting physician is Dr. Odella Cough. Call report [phone]. Representative was Tammy.   ER Staff is aware of it: Olam, ER Secretary Dr. Clarine, ER MD Ronnald PEAK, Patient's Nurse   Address:  [address]                 [place], [place] [address]

## 2024-05-19 NOTE — ED Notes (Signed)
 Pt asked for a cup of ice water.

## 2024-05-20 MED ORDER — MELATONIN 5 MG PO TABS
2.5000 mg | ORAL_TABLET | Freq: Every day | ORAL | Status: DC
  Filled 2024-05-20: qty 1

## 2024-05-20 MED ORDER — HYDROXYZINE HCL 25 MG PO TABS
50.0000 mg | ORAL_TABLET | Freq: Three times a day (TID) | ORAL | Status: DC | PRN
  Filled 2024-05-20: qty 2

## 2024-05-20 NOTE — ED Notes (Signed)
Breakfast tray and juice provided  

## 2024-05-20 NOTE — ED Notes (Signed)
 Pt safe for transfer at this time. Pt transported to [facility] with [facility]. Mother, legal guardian, aware. Pt ambulatory out of department with steady gait escorted by [facility].

## 2024-05-20 NOTE — ED Notes (Signed)
 Pt given shower supplies and escorted to bathroom to shower per her request.

## 2024-05-20 NOTE — ED Notes (Signed)
 Pt out of shower and returned to stretcher in hallway. Pt appears more calm and cooperative at this time.
